# Patient Record
Sex: Female | Born: 1935 | Race: White | Hispanic: No | Marital: Single | Smoking: Former smoker
Health system: Southern US, Community
[De-identification: ages and names within clinical notes are randomized; demographics above are authoritative.]

## PROBLEM LIST (undated history)

## (undated) DIAGNOSIS — G309 Alzheimer's disease, unspecified: Secondary | ICD-10-CM

## (undated) DIAGNOSIS — N189 Chronic kidney disease, unspecified: Secondary | ICD-10-CM

## (undated) DIAGNOSIS — I4891 Unspecified atrial fibrillation: Secondary | ICD-10-CM

## (undated) DIAGNOSIS — R0602 Shortness of breath: Secondary | ICD-10-CM

## (undated) DIAGNOSIS — M549 Dorsalgia, unspecified: Secondary | ICD-10-CM

## (undated) DIAGNOSIS — F329 Major depressive disorder, single episode, unspecified: Secondary | ICD-10-CM

## (undated) DIAGNOSIS — I499 Cardiac arrhythmia, unspecified: Secondary | ICD-10-CM

## (undated) DIAGNOSIS — I209 Angina pectoris, unspecified: Secondary | ICD-10-CM

## (undated) DIAGNOSIS — I10 Essential (primary) hypertension: Secondary | ICD-10-CM

## (undated) DIAGNOSIS — E119 Type 2 diabetes mellitus without complications: Secondary | ICD-10-CM

## (undated) DIAGNOSIS — G629 Polyneuropathy, unspecified: Secondary | ICD-10-CM

## (undated) DIAGNOSIS — G4730 Sleep apnea, unspecified: Secondary | ICD-10-CM

## (undated) DIAGNOSIS — F32A Depression, unspecified: Secondary | ICD-10-CM

## (undated) DIAGNOSIS — I509 Heart failure, unspecified: Secondary | ICD-10-CM

## (undated) DIAGNOSIS — I639 Cerebral infarction, unspecified: Secondary | ICD-10-CM

## (undated) DIAGNOSIS — R413 Other amnesia: Secondary | ICD-10-CM

## (undated) DIAGNOSIS — J449 Chronic obstructive pulmonary disease, unspecified: Secondary | ICD-10-CM

## (undated) DIAGNOSIS — F0280 Dementia in other diseases classified elsewhere without behavioral disturbance: Secondary | ICD-10-CM

## (undated) DIAGNOSIS — M79606 Pain in leg, unspecified: Secondary | ICD-10-CM

## (undated) HISTORY — DX: Sleep apnea, unspecified: G47.30

## (undated) HISTORY — DX: Other amnesia: R41.3

## (undated) HISTORY — DX: Chronic obstructive pulmonary disease, unspecified: J44.9

## (undated) HISTORY — PX: HERNIA REPAIR: SHX51

## (undated) HISTORY — PX: COLON SURGERY: SHX602

## (undated) HISTORY — DX: Type 2 diabetes mellitus without complications: E11.9

## (undated) HISTORY — PX: BREAST SURGERY: SHX581

## (undated) HISTORY — PX: BREAST LUMPECTOMY: SHX2

## (undated) HISTORY — DX: Cardiac arrhythmia, unspecified: I49.9

---

## 2006-09-03 ENCOUNTER — Inpatient Hospital Stay (HOSPITAL_COMMUNITY): Admission: EM | Admit: 2006-09-03 | Discharge: 2006-09-05 | Payer: Self-pay | Admitting: *Deleted

## 2010-08-13 NOTE — H&P (Signed)
NAME:  Kara Ortega NO.:  [id number]   MEDICAL RECORD NO.:  [id number]          PATIENT TYPE:  INP   LOCATION:  1856                         FACILITY:  [facility]   PHYSICIAN:  Rexene Alberts, M.D.    DATE OF BIRTH:  16-Oct-1929   DATE OF ADMISSION:  09/03/2006  DATE OF DISCHARGE:                              HISTORY & PHYSICAL   PRIMARY CARE PHYSICIAN:  The patient is unassigned.   CHIEF COMPLAINT:  Chest pressure with jaw discomfort.   HISTORY OF PRESENT ILLNESS:  The patient is a 77 year old woman with a  past medical history significant for type 2 diabetes mellitus,  hypertension, and hyperlipidemia.  She also has a history of chest pain  and was evaluated by her cardiologist in [place] approximately six  months ago with a stress test.  She was also evaluated a little over one  year ago with a cardiac catheterization.  The patient states that both  procedures yielded essentially negative results.  She is visiting her  son who lives here in [place].  Today as she was shopping with her  daughter-in-law, she developed bilateral jaw discomfort followed by  chest pressure.  At the time the jaw pain was rated as a 7-8/10 in  intensity and the chest pressure was also rated a 7-8/10 in intensity.  At the time that the pain occurred, she was standing.  When she sat  down, the discomfort did not diminish.  She subsequently went home.  Her  pain gradually subsided to a 2/10 in intensity.  The patient initially  did not want to come to the hospital; however, her family insisted.  During the episode of jaw and chest pain, she did have associated  shortness of breath, clamminess, and radiation to the left shoulder.  She did not have any light-headedness, nausea, or pleurisy.  She had an  episode of chest pressure last week in [place].  She was evaluated by  the EMT at home; however, she refused to go to the emergency department  for further evaluation at that time.  The  patient is currently chest  pain free.   The patient was given 1 mg of Dilaudid in the emergency department.  Following Dilaudid, she developed light-headedness, shortness of breath,  facial erythema, and nausea.  It was felt that she had a reaction to  Dilaudid.  After several minutes, she now feels better.  She was also  given Compazine for the nausea.  Currently she is hemodynamically  stable.  Her chest x-ray reveals no acute cardiopulmonary findings.  Her  EKG reveals normal sinus rhythm with a heart rate of 60 beats per minute  and no acute abnormalities.  Her initial cardiac markers are negative.Marland Kitchen  However, given her risk factors, she will be admitted for further  evaluation and management.   PAST MEDICAL HISTORY:  1. History of chest pain with a negative cardiac catheterization      approximately one year to 1-1/2 years ago and a negative stress      test approximately six months ago in [place].  2. Type 2 diabetes mellitus.  3. Hypertension.  4. Hyperlipidemia.  5. COPD.  6. Obstructive sleep apnea on BiPAP nightly.  7. Osteoporosis.  8. Gastroesophageal reflux disease.  9. Restless legs syndrome.  10.Costochondritis.  11.Plantar fasciitis.  12.Obesity.   MEDICATIONS:  1. Actos one tablet daily.  2. Advair 250/50 mcg one puff b.i.d.  3. Aspirin 81 mg daily.  4. Atenolol 50 mg daily.  5. Benadryl as needed.  6. Clonazepam 0.5 mg 1-2 tablets nightly.  7. Fosamax-D 70 mg one tablet weekly.  8. Potassium chloride 10 mEq daily.  9. Metformin 500 mg daily.  10.Nexium 40 mg daily.  11.Singulair 10 mg daily.  12.Triamterene/hydrochlorothiazide 37.5/25 mg daily.  13.Tricor 145 mg daily.  14.Ventolin inhaler one puff as needed.  15.Glyburide 1.25 mg daily.  16.Tylenol PM two tablets nightly as needed.  17.Aleve two tablets p.r.n. for pain.  18.Lyrica 75 mg b.i.d.  19.Voltaren cream, rub on the chest p.r.n.  20.BiPAP nightly.   ALLERGIES:  1. MORPHINE.  2. DILAUDID  (as of tonight).   SOCIAL HISTORY:  The patient is divorced.  She lives in [place].  She  has three children.  She is a retired Pharmacist, hospital.  She is currently  visiting her son Kara Ortega.  She stopped smoking approximately 20  years ago  after smoking 1-1/2 packs of cigarettes for 38 years.  She denies  alcohol and illicit drug use.   FAMILY HISTORY:  Her mother died of heart disease.  Her father died of  complications of diabetes and alcoholism.   REVIEW OF SYSTEMS:  The patient's review of systems is positive for  occasional shortness of breath, degenerative joint disease with pain in  her legs and her lower back, occasional swelling in her legs, numbness  and tingling in her feet.  Otherwise review of systems is negative.   PHYSICAL EXAMINATION:  VITAL SIGNS:  Temperature 97.2, blood pressure  142/83, repeated at 114/60, pulse 60, respiratory rate 20, oxygen  saturation 96% on room air.  GENERAL:  The patient is a pleasant, obese, 77 year old Caucasian woman  who is currently sitting up in bed in no acute distress, although she  was recently nauseated.  HEENT:  Head is normocephalic, nontraumatic.  Pupils are equal, round,  and reactive to light.  Extraocular movements are intact.  Conjunctivae  are clear.  Sclerae are white.  Tympanic membranes are clear  bilaterally.  Nasal mucosa is mildly dry.  No sinus tenderness.  Oropharynx reveals good dentition.  Mucous membranes are mildly dry.  No  posterior exudates or erythema.  NECK:  Supple.  No adenopathy, no thyromegaly, no bruit, no JVD.  LUNGS:  Decreased breath sounds in the bases, otherwise clear.  HEART:  S1/S2 with a 2/6 systolic murmur.  ABDOMEN:  Obese, positive bowel sounds.  Soft, nontender, nondistended.  No hepatosplenomegaly.  No masses palpated.  EXTREMITIES:  Trace of pedal edema bilaterally.  Pedal pulses palpable  bilaterally. NEUROLOGIC:  The patient is alert and oriented x3.  Cranial nerves II-  XII are intact.   Strength is 5/5 throughout.  Sensation is intact.   LABORATORY DATA:  Admission laboratories, chest x-ray results, and EKG  results are above.  Myoglobin 73.7, CK-MB 1.3, troponin I less than  0.05.  BNP 30.  Sodium 140, potassium 3.8, chloride 107, BUN 28, glucose  86, bicarbonate 27, creatinine 0.8.  WBC 9.4, hemoglobin 14.3, platelets  275.   ASSESSMENT:  1. Chest pain.  The patient's pain appears to be somewhat atypical;  however, she does have risk factors including age, diabetes      mellitus, hypertension, and hyperlipidemia.  Her EKG, however, is      completely within normal limits, and her cardiac markers are      negative so far.  Also, more importantly, she just recently      underwent a Cardiolite stress test which was apparently negative.      In addition the cardiac catheterization approximately a year ago      was also negative. Given her recent travel, pulmonary embolism is a      concern.  2. Type 2 diabetes mellitus.  The patient's glycemic control appears      to be within normal limits at this time.  3. Hypertension.  The patient's blood pressure is currently stable.  4. Nausea, vomiting, and diaphoresis followed by Dilaudid.  The      patient probably has either an allergy or an intolerance to      Dilaudid.  There are no signs of cardiopulmonary compromise.   PLAN:  1. The patient will be admitted for further evaluation and management.  2. Will check cardiac enzymes q.8 h. x3 and a D-dimer.  If the      patient's D-dimer is positive, will order a CT scan of the chest to      evaluate for PE.  3. Will continue proton pump inhibitor therapy with Protonix.  Will      add Mylanta p.r.n.  4. Continue chronic medications.  5. Pain management with as needed Tylenol and Vicodin in addition to      Lyrica.      Rexene Alberts, M.D.  Electronically Signed     DF/MEDQ  D:  09/03/2006  T:  09/03/2006  Job:  [id number]

## 2010-08-16 NOTE — Discharge Summary (Signed)
NAMEMarland Kitchen  Kara Ortega, Kara Ortega NO.:  [id number]   MEDICAL RECORD NO.:  [id number]          PATIENT TYPE:  INP   LOCATION:  2032                         FACILITY:  [facility]   PHYSICIAN:  Doree Albee, M.D.DATE OF BIRTH:  1930/03/25   DATE OF ADMISSION:  09/03/2006  DATE OF DISCHARGE:  09/05/2006                               DISCHARGE SUMMARY   FINAL DISCHARGE DIAGNOSES:  1. Atypical chest pain.  2. Paroxysmal atrial fibrillation.  3. Diastolic dysfunction.  4. Type 2 non-insulin-dependent diabetes mellitus.  5. Hypertension.  6. Hyperlipidemia.   CONDITION ON DISCHARGE:  Stable.   MEDICATIONS ON DISCHARGE:  1. Actos 30 mg q.day.  2. Advair Diskus 250/50 b.i.d.  3. Aspirin 81 mg q.day.  4. Atenolol 50 mg q.day.  5. Clonazepam 0.5 mg one to two tablets q.h.s.  6. Fosamax 70 mg once a week.  7. K-Dur 10 mEq q.day.  8. Metformin 500 mg q.day.  9. Nexium 40 mg q.day.  10.Singulair 10 mg q.day.  11.Triamterene/HCTZ  37.5/25 one tablet q.day.  12.Ranexa 500 mg b.i.d.  13.Z-Pak.   HISTORY:  This is a 77 year old lady who was admitted with chest pain  and bilateral jaw discomfort.  Please see initial history and physical  examination done by Dr. Rexene Alberts.   HOSPITAL PROGRESS:  She was seen and serial cardiac enzymes were done.  These were negative.  There was no specific electrocardiographic changes  except that she was in atrial fibrillation.  She was seen by Dr. Verlon Setting,  cardiologist, who added Ranexa to her medications.  The following day  she was doing well and was chest pain free.  On physical examination,  temperature 97.7, blood pressure 120/63, pulse 68, saturation 97% on two  liters of oxygen.   FURTHER DISPOSITION:  She lives out of town, but she will follow up with  a cardiologist prior to going back to her home state.      Doree Albee, M.D.  Electronically Signed     NCG/MEDQ  D:  11/27/2006  T:  11/27/2006  Job:  [id number]

## 2011-01-16 LAB — POCT CARDIAC MARKERS
CKMB, poc: 1.3
CKMB, poc: 2.6
Myoglobin, poc: 73.7
Myoglobin, poc: 79.8
Operator id: 265961
Operator id: 272551
Troponin i, poc: <0.05

## 2011-01-16 LAB — I-STAT 8, (EC8 V) (CONVERTED LAB)
BUN: 28 — ABNORMAL HIGH
Bicarbonate: 26.8 — ABNORMAL HIGH
Glucose, Bld: 86
Sodium: 140
TCO2: 28
pCO2, Ven: 44.6 — ABNORMAL LOW
pH, Ven: 7.386 — ABNORMAL HIGH

## 2011-01-16 LAB — LIPID PANEL
HDL: 46
Total CHOL/HDL Ratio: 4.8

## 2011-01-16 LAB — COMPREHENSIVE METABOLIC PANEL
ALT: 35
Albumin: 4.1
Alkaline Phosphatase: 29 — ABNORMAL LOW
Chloride: 103
Glucose, Bld: 142 — ABNORMAL HIGH
Potassium: 3.7
Sodium: 143
Total Bilirubin: 0.4
Total Protein: 7

## 2011-01-16 LAB — CBC
HCT: 44
Hemoglobin: 14.8
MCHC: 34
MCV: 85
MCV: 85.4
Platelets: 275
Platelets: 275
RBC: 4.94
RBC: 5.15 — ABNORMAL HIGH
WBC: 12 — ABNORMAL HIGH
WBC: 9.4

## 2011-01-16 LAB — CK TOTAL AND CKMB (NOT AT ARMC)
CK, MB: 3.8
Relative Index: 2.5
Total CK: 121
Total CK: 155

## 2011-01-16 LAB — TROPONIN I: Troponin I: 0.02

## 2011-01-16 LAB — DIFFERENTIAL
Basophils Relative: 1
Eosinophils Absolute: 0.2
Monocytes Relative: 9
Neutro Abs: 5.3
Neutrophils Relative %: 56

## 2011-01-16 LAB — D-DIMER, QUANTITATIVE: D-Dimer, Quant: 0.38

## 2011-01-16 LAB — TSH: TSH: 4.455

## 2011-01-16 LAB — POCT I-STAT CREATININE: Creatinine, Ser: 0.8

## 2013-03-25 ENCOUNTER — Ambulatory Visit (INDEPENDENT_AMBULATORY_CARE_PROVIDER_SITE_OTHER): Payer: Medicare PPO | Admitting: Pulmonary Disease

## 2013-03-25 ENCOUNTER — Encounter: Payer: Self-pay | Admitting: Pulmonary Disease

## 2013-03-25 ENCOUNTER — Ambulatory Visit (INDEPENDENT_AMBULATORY_CARE_PROVIDER_SITE_OTHER)
Admission: RE | Admit: 2013-03-25 | Discharge: 2013-03-25 | Disposition: A | Discharge status: Home / Self Care | Payer: Medicare PPO | Source: Ambulatory Visit | Attending: Pulmonary Disease | Admitting: Pulmonary Disease

## 2013-03-25 ENCOUNTER — Institutional Professional Consult (permissible substitution): Payer: Self-pay | Admitting: Pulmonary Disease

## 2013-03-25 VITALS — BP 130/64 | HR 56 | Temp 98.1°F | Ht 61.0 in | Wt 225.0 lb

## 2013-03-25 DIAGNOSIS — R0609 Other forms of dyspnea: Secondary | ICD-10-CM | POA: Insufficient documentation

## 2013-03-25 DIAGNOSIS — R0989 Other specified symptoms and signs involving the circulatory and respiratory systems: Secondary | ICD-10-CM

## 2013-03-25 DIAGNOSIS — R0600 Dyspnea, unspecified: Secondary | ICD-10-CM

## 2013-03-25 MED ORDER — FLUTICASONE-SALMETEROL 45-21 MCG/ACT IN AERO: 2.0000 | INHALATION_SPRAY | Freq: Two times a day (BID) | RESPIRATORY_TRACT | Status: DC | PRN

## 2013-03-25 MED ORDER — ALBUTEROL SULFATE HFA 108 (90 BASE) MCG/ACT IN AERS: 1.0000 | INHALATION_SPRAY | RESPIRATORY_TRACT | Status: DC | PRN

## 2013-03-25 NOTE — Assessment & Plan Note (Signed)
The patient has significant dyspnea on exertion that I suspect is multifactorial. She is morbidly obese with a significant central component which can lead to restrictive physiology, she is clearly deconditioned, and also describes what sounds like angina and a recent hospitalization for congestive heart failure. She has chronic lower extremity edema. Finally, it is unclear how much of this is obstructive lung disease, and we will get her recent PFTs for evaluation. For now, I have asked her to continue on Advair, but she needs to take this on a twice a day basis regularly. Will also check a chest x-ray for completeness.

## 2013-03-25 NOTE — Patient Instructions (Signed)
Stay on advair at current dose for now, but make sure you take 2 puffs am and pm EVERYDAY no matter what.  Rinse mouth well after using. Will send in a prescription for 90 days on advair and albuterol rescue with refills. Will check a chest xray today and call you with results, as well as get breathing studies from your prior allergist.  Work on weight loss and conditioning as able.  This will really help your breathing.  followup with me in 38mos, but will call once I can receive and review your old records.

## 2013-03-25 NOTE — Addendum Note (Signed)
Addended by: Virl Cagey on: 03/25/2013 12:18 PM   Modules accepted: Orders

## 2013-03-25 NOTE — Progress Notes (Signed)
   Subjective:    Patient ID: Kara Ortega, female    DOB: Jul 06, 1929, 79 y.o.   MRN: [id number]  HPI The patient is an 79 year old female who I've been asked to see for breathing issues. The patient tells me that she has been diagnosed with COPD by an allergist in her prior home of [place], and she has been on Advair for 13 years but has not been taking on a consistent basis. She tells me that no one has ever characterized her airflow obstruction, and her last PFTs were 6 months ago. She does have a history of moderate tobacco abuse, but has not smoked in 30 years, and has no personal history of asthma during childhood. She has not been on prednisone for quite some time, but was in the hospital about a year ago for what sounds like angina and congestive heart failure. She is scheduled to see a cardiologist. She describes a less than one block dyspnea on exertion, and can get winded just walking through the house at times. She finds it very difficult to walk any length of stairs, and describes chest pain halfway up a flight of stairs. She has no chronic cough and only produces mucus on occasion. She has chronic lower extremity edema.   Review of Systems  Constitutional: Negative for fever and unexpected weight change.  HENT: Positive for congestion and sinus pressure. Negative for dental problem, ear pain, nosebleeds, postnasal drip, rhinorrhea, sneezing, sore throat and trouble swallowing.   Eyes: Negative for redness and itching.  Respiratory: Positive for cough and shortness of breath. Negative for chest tightness and wheezing.   Cardiovascular: Negative for palpitations and leg swelling.  Gastrointestinal: Negative for nausea and vomiting.  Genitourinary: Negative for dysuria.  Musculoskeletal: Negative for joint swelling.  Skin: Negative for rash.  Neurological: Negative for headaches.  Hematological: Does not bruise/bleed easily.  Psychiatric/Behavioral: Negative for dysphoric mood. The  patient is not nervous/anxious.        Objective:   Physical Exam Constitutional:  Morbidly obese female, no acute distress  HENT:  Nares patent without discharge  Oropharynx without exudate, palate and uvula are normal  Eyes:  Perrla, eomi, no scleral icterus  Neck:  No JVD, no TMG  Cardiovascular:  Normal rate, regular rhythm, no rubs or gallops.  3/6 sem        Intact distal pulses but diminished.  Pulmonary :  Normal breath sounds, no stridor or respiratory distress   No rales, rhonchi, or wheezing  Abdominal:  Soft, nondistended, bowel sounds present.  No tenderness noted.   Musculoskeletal:  2+ lower extremity edema noted.  Lymph Nodes:  No cervical lymphadenopathy noted  Skin:  No cyanosis noted  Neurologic:  Alert, appropriate, moves all 4 extremities without obvious deficit.         Assessment & Plan:

## 2013-04-11 ENCOUNTER — Telehealth: Payer: Self-pay | Admitting: Pulmonary Disease

## 2013-04-11 NOTE — Telephone Encounter (Signed)
Spoke with son Delfino Lovett)-- Going to speak with patient and will contact our office back once he checks with her on scheduling appt.

## 2013-04-11 NOTE — Telephone Encounter (Signed)
Please let pt know that we received a lot of records, but there were no breathing studies in them.  See if she would be willing to have  Breathing studies here?  If so, needs full pfts scheduled soon, and I will call once I review results.

## 2013-06-02 ENCOUNTER — Telehealth: Payer: Self-pay | Admitting: Pulmonary Disease

## 2013-06-02 DIAGNOSIS — R0609 Other forms of dyspnea: Principal | ICD-10-CM

## 2013-06-02 DIAGNOSIS — R0600 Dyspnea, unspecified: Secondary | ICD-10-CM

## 2013-06-02 NOTE — Telephone Encounter (Signed)
See message string. Kara Ortega, please see if this pt has an upcoming apptm with me, and if so, she needs full pfts prior to the visit or same day.

## 2013-06-03 NOTE — Telephone Encounter (Signed)
Pt scheduled for PFT same day as appt with [place] (09/23/13) at 1pm Instructions to be mailed to patient home address.  Pt aware that these are being mailed.

## 2013-07-06 ENCOUNTER — Telehealth: Payer: Self-pay | Admitting: Cardiology

## 2013-07-06 NOTE — Telephone Encounter (Signed)
New message     Kara Ortega a new murmur today while visiting the patient.  Want to make doctor aware

## 2013-07-06 NOTE — Telephone Encounter (Signed)
Please review and advise.

## 2013-07-07 NOTE — Telephone Encounter (Signed)
OK to have her come in for clinic visit.

## 2013-07-08 NOTE — Telephone Encounter (Signed)
Spoke with patients son, patient is scheduled to be seen in the office.

## 2013-07-14 ENCOUNTER — Ambulatory Visit: Payer: Medicare PPO | Admitting: Cardiology

## 2013-09-13 ENCOUNTER — Encounter (HOSPITAL_COMMUNITY): Payer: Self-pay | Admitting: Emergency Medicine

## 2013-09-13 ENCOUNTER — Inpatient Hospital Stay (HOSPITAL_COMMUNITY)
Admission: EM | Admit: 2013-09-13 | Discharge: 2013-09-14 | DRG: 392 | Disposition: A | Discharge status: Home / Self Care | Payer: Medicare Other | Attending: Internal Medicine | Admitting: Internal Medicine

## 2013-09-13 DIAGNOSIS — R197 Diarrhea, unspecified: Secondary | ICD-10-CM | POA: Diagnosis present

## 2013-09-13 DIAGNOSIS — I10 Essential (primary) hypertension: Secondary | ICD-10-CM | POA: Diagnosis present

## 2013-09-13 DIAGNOSIS — E119 Type 2 diabetes mellitus without complications: Secondary | ICD-10-CM | POA: Diagnosis present

## 2013-09-13 DIAGNOSIS — I4891 Unspecified atrial fibrillation: Secondary | ICD-10-CM | POA: Diagnosis present

## 2013-09-13 DIAGNOSIS — Z79899 Other long term (current) drug therapy: Secondary | ICD-10-CM

## 2013-09-13 DIAGNOSIS — R0609 Other forms of dyspnea: Secondary | ICD-10-CM

## 2013-09-13 DIAGNOSIS — K219 Gastro-esophageal reflux disease without esophagitis: Principal | ICD-10-CM | POA: Diagnosis present

## 2013-09-13 DIAGNOSIS — Z6841 Body Mass Index (BMI) 40.0 and over, adult: Secondary | ICD-10-CM

## 2013-09-13 DIAGNOSIS — I509 Heart failure, unspecified: Secondary | ICD-10-CM | POA: Diagnosis present

## 2013-09-13 DIAGNOSIS — G4733 Obstructive sleep apnea (adult) (pediatric): Secondary | ICD-10-CM | POA: Diagnosis present

## 2013-09-13 DIAGNOSIS — E669 Obesity, unspecified: Secondary | ICD-10-CM | POA: Diagnosis present

## 2013-09-13 DIAGNOSIS — Z794 Long term (current) use of insulin: Secondary | ICD-10-CM

## 2013-09-13 DIAGNOSIS — Z8249 Family history of ischemic heart disease and other diseases of the circulatory system: Secondary | ICD-10-CM

## 2013-09-13 DIAGNOSIS — Z7982 Long term (current) use of aspirin: Secondary | ICD-10-CM

## 2013-09-13 DIAGNOSIS — Z87891 Personal history of nicotine dependence: Secondary | ICD-10-CM

## 2013-09-13 DIAGNOSIS — I200 Unstable angina: Secondary | ICD-10-CM

## 2013-09-13 DIAGNOSIS — R0600 Dyspnea, unspecified: Secondary | ICD-10-CM

## 2013-09-13 DIAGNOSIS — Z7901 Long term (current) use of anticoagulants: Secondary | ICD-10-CM

## 2013-09-13 DIAGNOSIS — J449 Chronic obstructive pulmonary disease, unspecified: Secondary | ICD-10-CM

## 2013-09-13 DIAGNOSIS — R079 Chest pain, unspecified: Secondary | ICD-10-CM | POA: Diagnosis present

## 2013-09-13 DIAGNOSIS — J4489 Other specified chronic obstructive pulmonary disease: Secondary | ICD-10-CM | POA: Diagnosis present

## 2013-09-13 HISTORY — DX: Heart failure, unspecified: I50.9

## 2013-09-13 HISTORY — DX: Unspecified atrial fibrillation: I48.91

## 2013-09-13 HISTORY — DX: Major depressive disorder, single episode, unspecified: F32.9

## 2013-09-13 HISTORY — DX: Shortness of breath: R06.02

## 2013-09-13 HISTORY — DX: Angina pectoris, unspecified: I20.9

## 2013-09-13 HISTORY — DX: Essential (primary) hypertension: I10

## 2013-09-13 HISTORY — DX: Chronic kidney disease, unspecified: N18.9

## 2013-09-13 HISTORY — DX: Cerebral infarction, unspecified: I63.9

## 2013-09-13 HISTORY — DX: Depression, unspecified: F32.A

## 2013-09-13 NOTE — ED Notes (Signed)
Pt presents with central chest pressure radiating up into her Left neck/ear, nausea, diaphoresis, dizziness, lightheaded, and SOB. Pt states her Left ear feels full. Pt states her pain woke her from her sleep. Pt describes her pain as a burning/pressure. Pt states she took 2 nitro's at 2310 and 2315 with no relief

## 2013-09-14 ENCOUNTER — Emergency Department (HOSPITAL_COMMUNITY): Payer: Medicare Other

## 2013-09-14 ENCOUNTER — Encounter (HOSPITAL_COMMUNITY): Payer: Self-pay | Admitting: General Practice

## 2013-09-14 DIAGNOSIS — I200 Unstable angina: Secondary | ICD-10-CM | POA: Diagnosis present

## 2013-09-14 DIAGNOSIS — I4891 Unspecified atrial fibrillation: Secondary | ICD-10-CM | POA: Diagnosis present

## 2013-09-14 DIAGNOSIS — E119 Type 2 diabetes mellitus without complications: Secondary | ICD-10-CM

## 2013-09-14 DIAGNOSIS — J449 Chronic obstructive pulmonary disease, unspecified: Secondary | ICD-10-CM | POA: Diagnosis present

## 2013-09-14 DIAGNOSIS — I509 Heart failure, unspecified: Secondary | ICD-10-CM | POA: Diagnosis present

## 2013-09-14 DIAGNOSIS — R197 Diarrhea, unspecified: Secondary | ICD-10-CM | POA: Diagnosis present

## 2013-09-14 DIAGNOSIS — R079 Chest pain, unspecified: Secondary | ICD-10-CM

## 2013-09-14 LAB — CBC
HCT: 40.6 % (ref 36.0–46.0)
HEMATOCRIT: 39.4 % (ref 36.0–46.0)
HEMOGLOBIN: 12.6 g/dL (ref 12.0–15.0)
HEMOGLOBIN: 13.1 g/dL (ref 12.0–15.0)
MCH: 27.2 pg (ref 26.0–34.0)
MCH: 27.8 pg (ref 26.0–34.0)
MCHC: 32 g/dL (ref 30.0–36.0)
MCHC: 32.3 g/dL (ref 30.0–36.0)
MCV: 85.1 fL (ref 78.0–100.0)
MCV: 86 fL (ref 78.0–100.0)
Platelets: 242 10*3/uL (ref 150–400)
Platelets: 260 10*3/uL (ref 150–400)
RBC: 4.63 MIL/uL (ref 3.87–5.11)
RBC: 4.72 MIL/uL (ref 3.87–5.11)
RDW: 16.4 % — ABNORMAL HIGH (ref 11.5–15.5)
RDW: 16.5 % — ABNORMAL HIGH (ref 11.5–15.5)
WBC: 8.2 10*3/uL (ref 4.0–10.5)
WBC: 8.8 10*3/uL (ref 4.0–10.5)

## 2013-09-14 LAB — APTT: APTT: 49 s — AB (ref 24–37)

## 2013-09-14 LAB — TROPONIN I
Troponin I: <0.3 ng/mL (ref <0.30)
Troponin I: <0.3 ng/mL (ref <0.30)
Troponin I: <0.3 ng/mL (ref <0.30)

## 2013-09-14 LAB — PROTIME-INR
INR: 2.89 — ABNORMAL HIGH (ref 0.00–1.49)
INR: 2.83 — AB (ref 0.00–1.49)
PROTHROMBIN TIME: 28.8 s — AB (ref 11.6–15.2)
PROTHROMBIN TIME: 29.2 s — AB (ref 11.6–15.2)

## 2013-09-14 LAB — BASIC METABOLIC PANEL
BUN: 35 mg/dL — AB (ref 6–23)
BUN: 37 mg/dL — AB (ref 6–23)
CALCIUM: 10.2 mg/dL (ref 8.4–10.5)
CALCIUM: 10.4 mg/dL (ref 8.4–10.5)
CO2: 20 mEq/L (ref 19–32)
CO2: 26 meq/L (ref 19–32)
CREATININE: 1.05 mg/dL (ref 0.50–1.10)
Chloride: 98 mEq/L (ref 96–112)
Chloride: 98 mEq/L (ref 96–112)
Creatinine, Ser: 1.21 mg/dL — ABNORMAL HIGH (ref 0.50–1.10)
GFR calc Af Amer: 47 mL/min — ABNORMAL LOW (ref >90)
GFR calc Af Amer: 56 mL/min — ABNORMAL LOW (ref >90)
GFR calc non Af Amer: 48 mL/min — ABNORMAL LOW (ref >90)
GFR, EST NON AFRICAN AMERICAN: 41 mL/min — AB (ref >90)
GLUCOSE: 213 mg/dL — AB (ref 70–99)
Glucose, Bld: 160 mg/dL — ABNORMAL HIGH (ref 70–99)
POTASSIUM: 4.8 meq/L (ref 3.7–5.3)
Potassium: 4.4 mEq/L (ref 3.7–5.3)
Sodium: 140 mEq/L (ref 137–147)
Sodium: 140 mEq/L (ref 137–147)

## 2013-09-14 LAB — GLUCOSE, CAPILLARY
GLUCOSE-CAPILLARY: 160 mg/dL — AB (ref 70–99)
GLUCOSE-CAPILLARY: 162 mg/dL — AB (ref 70–99)
GLUCOSE-CAPILLARY: 208 mg/dL — AB (ref 70–99)

## 2013-09-14 LAB — I-STAT TROPONIN, ED: Troponin i, poc: 0.01 ng/mL (ref 0.00–0.08)

## 2013-09-14 LAB — MRSA PCR SCREENING: MRSA BY PCR: NEGATIVE

## 2013-09-14 LAB — PRO B NATRIURETIC PEPTIDE: PRO B NATRI PEPTIDE: 115.2 pg/mL (ref 0–450)

## 2013-09-14 MED ORDER — ZOLPIDEM TARTRATE 5 MG PO TABS: 5.0000 mg | ORAL_TABLET | Freq: Every evening | ORAL | Status: DC | PRN

## 2013-09-14 MED ORDER — ALUM & MAG HYDROXIDE-SIMETH 200-200-20 MG/5ML PO SUSP: 30.0000 mL | Freq: Four times a day (QID) | ORAL | Status: DC | PRN

## 2013-09-14 MED ORDER — SODIUM CHLORIDE 0.9 % IJ SOLN: 3.0000 mL | INTRAMUSCULAR | Status: DC | PRN

## 2013-09-14 MED ORDER — ADULT MULTIVITAMIN W/MINERALS CH
1.0000 | ORAL_TABLET | Freq: Every day | ORAL | Status: DC
  Administered 2013-09-14: 11:00:00 1 via ORAL
  Filled 2013-09-14: qty 1

## 2013-09-14 MED ORDER — B COMPLEX-C PO TABS
1.0000 | ORAL_TABLET | Freq: Every day | ORAL | Status: DC
Start: 2013-09-14 — End: 2013-09-14
  Administered 2013-09-14: 11:00:00 1 via ORAL
  Filled 2013-09-14: qty 1

## 2013-09-14 MED ORDER — DILTIAZEM HCL ER BEADS 240 MG PO CP24
240.0000 mg | ORAL_CAPSULE | Freq: Every day | ORAL | Status: DC
  Administered 2013-09-14: 240 mg via ORAL
  Filled 2013-09-14: qty 1

## 2013-09-14 MED ORDER — SUCRALFATE 1 GM/10ML PO SUSP
1.0000 g | Freq: Three times a day (TID) | ORAL | Status: DC
  Administered 2013-09-14 (×3): 1 g via ORAL
  Filled 2013-09-14 (×5): qty 10

## 2013-09-14 MED ORDER — INSULIN DETEMIR 100 UNIT/ML ~~LOC~~ SOLN
58.0000 [IU] | Freq: Every day | SUBCUTANEOUS | Status: DC
  Filled 2013-09-14: qty 0.58

## 2013-09-14 MED ORDER — NITROGLYCERIN 2 % TD OINT
0.5000 [in_us] | TOPICAL_OINTMENT | Freq: Once | TRANSDERMAL | Status: AC
  Administered 2013-09-14: 0.5 [in_us] via TOPICAL
  Filled 2013-09-14: qty 1

## 2013-09-14 MED ORDER — SODIUM CHLORIDE 0.9 % IV SOLN: 250.0000 mL | INTRAVENOUS | Status: DC | PRN

## 2013-09-14 MED ORDER — SPIRONOLACTONE 25 MG PO TABS
25.0000 mg | ORAL_TABLET | Freq: Every day | ORAL | Status: DC
  Administered 2013-09-14: 25 mg via ORAL
  Filled 2013-09-14: qty 1

## 2013-09-14 MED ORDER — POTASSIUM CHLORIDE CRYS ER 20 MEQ PO TBCR
40.0000 meq | EXTENDED_RELEASE_TABLET | Freq: Every day | ORAL | Status: DC
  Administered 2013-09-14: 40 meq via ORAL
  Filled 2013-09-14 (×2): qty 2

## 2013-09-14 MED ORDER — ONDANSETRON HCL 4 MG/2ML IJ SOLN: 4.0000 mg | Freq: Four times a day (QID) | INTRAMUSCULAR | Status: DC | PRN

## 2013-09-14 MED ORDER — PANTOPRAZOLE SODIUM 40 MG PO TBEC
40.0000 mg | DELAYED_RELEASE_TABLET | Freq: Every day | ORAL | Status: DC
  Administered 2013-09-14: 11:00:00 40 mg via ORAL
  Filled 2013-09-14: qty 1

## 2013-09-14 MED ORDER — WARFARIN SODIUM 5 MG PO TABS
5.0000 mg | ORAL_TABLET | Freq: Every day | ORAL | Status: DC
  Filled 2013-09-14: qty 1

## 2013-09-14 MED ORDER — VITAMIN D3 25 MCG (1000 UNIT) PO TABS
4000.0000 [IU] | ORAL_TABLET | Freq: Every day | ORAL | Status: DC
  Administered 2013-09-14: 11:00:00 4000 [IU] via ORAL
  Filled 2013-09-14: qty 4

## 2013-09-14 MED ORDER — MOMETASONE FURO-FORMOTEROL FUM 100-5 MCG/ACT IN AERO
2.0000 | INHALATION_SPRAY | Freq: Two times a day (BID) | RESPIRATORY_TRACT | Status: DC
  Administered 2013-09-14: 08:00:00 2 via RESPIRATORY_TRACT
  Filled 2013-09-14: qty 8.8

## 2013-09-14 MED ORDER — NITROGLYCERIN 0.4 MG SL SUBL: 0.4000 mg | SUBLINGUAL_TABLET | SUBLINGUAL | Status: DC | PRN

## 2013-09-14 MED ORDER — ATORVASTATIN CALCIUM 10 MG PO TABS
10.0000 mg | ORAL_TABLET | Freq: Every day | ORAL | Status: DC
  Filled 2013-09-14: qty 1

## 2013-09-14 MED ORDER — ACETAMINOPHEN 325 MG PO TABS: 650.0000 mg | ORAL_TABLET | Freq: Four times a day (QID) | ORAL | Status: DC | PRN

## 2013-09-14 MED ORDER — WARFARIN - PHYSICIAN DOSING INPATIENT: Freq: Every day | Status: DC

## 2013-09-14 MED ORDER — POTASSIUM CHLORIDE 20 MEQ PO PACK
40.0000 meq | PACK | Freq: Every day | ORAL | Status: DC
  Filled 2013-09-14: qty 2

## 2013-09-14 MED ORDER — COLESEVELAM HCL 625 MG PO TABS
1875.0000 mg | ORAL_TABLET | Freq: Two times a day (BID) | ORAL | Status: DC
  Administered 2013-09-14 (×2): 1875 mg via ORAL
  Filled 2013-09-14 (×3): qty 3

## 2013-09-14 MED ORDER — ALBUTEROL SULFATE (2.5 MG/3ML) 0.083% IN NEBU: 3.0000 mL | INHALATION_SOLUTION | RESPIRATORY_TRACT | Status: DC | PRN

## 2013-09-14 MED ORDER — GI COCKTAIL ~~LOC~~
30.0000 mL | Freq: Once | ORAL | Status: AC
  Administered 2013-09-14: 30 mL via ORAL

## 2013-09-14 MED ORDER — ASPIRIN 81 MG PO CHEW
162.0000 mg | CHEWABLE_TABLET | Freq: Once | ORAL | Status: AC
  Administered 2013-09-14: 162 mg via ORAL
  Filled 2013-09-14: qty 2

## 2013-09-14 MED ORDER — SODIUM CHLORIDE 0.9 % IJ SOLN
3.0000 mL | Freq: Two times a day (BID) | INTRAMUSCULAR | Status: DC
  Administered 2013-09-14: 13:00:00 3 mL via INTRAVENOUS

## 2013-09-14 MED ORDER — CALCIUM CARBONATE 1250 (500 CA) MG PO TABS
1.0000 | ORAL_TABLET | Freq: Every day | ORAL | Status: DC
  Administered 2013-09-14: 10:00:00 500 mg via ORAL
  Filled 2013-09-14 (×2): qty 1

## 2013-09-14 MED ORDER — OXYCODONE HCL 5 MG PO TABS: 5.0000 mg | ORAL_TABLET | ORAL | Status: DC | PRN

## 2013-09-14 MED ORDER — NITROGLYCERIN 0.4 MG SL SUBL
0.4000 mg | SUBLINGUAL_TABLET | SUBLINGUAL | Status: DC | PRN
Start: 2013-09-14 — End: 2013-09-14
  Administered 2013-09-14: 0.4 mg via SUBLINGUAL
  Filled 2013-09-14: qty 1

## 2013-09-14 MED ORDER — METOPROLOL TARTRATE 25 MG PO TABS
25.0000 mg | ORAL_TABLET | Freq: Two times a day (BID) | ORAL | Status: DC
  Administered 2013-09-14: 11:00:00 25 mg via ORAL
  Filled 2013-09-14: qty 1

## 2013-09-14 MED ORDER — HYDROCODONE-ACETAMINOPHEN 5-325 MG PO TABS
1.0000 | ORAL_TABLET | Freq: Once | ORAL | Status: AC
  Administered 2013-09-14: 0.5 via ORAL
  Filled 2013-09-14: qty 1

## 2013-09-14 MED ORDER — NITROGLYCERIN 2 % TD OINT
1.0000 [in_us] | TOPICAL_OINTMENT | Freq: Four times a day (QID) | TRANSDERMAL | Status: DC
  Administered 2013-09-14: 1 [in_us] via TOPICAL
  Filled 2013-09-14: qty 30

## 2013-09-14 MED ORDER — ALLOPURINOL 100 MG PO TABS
100.0000 mg | ORAL_TABLET | Freq: Every day | ORAL | Status: DC
  Administered 2013-09-14: 11:00:00 100 mg via ORAL
  Filled 2013-09-14: qty 1

## 2013-09-14 MED ORDER — INSULIN DETEMIR 100 UNIT/ML ~~LOC~~ SOLN
58.0000 [IU] | Freq: Once | SUBCUTANEOUS | Status: AC
  Administered 2013-09-14: 58 [IU] via SUBCUTANEOUS
  Filled 2013-09-14: qty 0.58

## 2013-09-14 MED ORDER — ONDANSETRON HCL 4 MG PO TABS: 4.0000 mg | ORAL_TABLET | Freq: Four times a day (QID) | ORAL | Status: DC | PRN

## 2013-09-14 MED ORDER — FENOFIBRATE 160 MG PO TABS
160.0000 mg | ORAL_TABLET | Freq: Every day | ORAL | Status: DC
  Administered 2013-09-14: 160 mg via ORAL
  Filled 2013-09-14: qty 1

## 2013-09-14 MED ORDER — CITALOPRAM HYDROBROMIDE 20 MG PO TABS
20.0000 mg | ORAL_TABLET | Freq: Every day | ORAL | Status: DC
  Administered 2013-09-14: 20 mg via ORAL
  Filled 2013-09-14: qty 1

## 2013-09-14 MED ORDER — ASPIRIN EC 325 MG PO TBEC
325.0000 mg | DELAYED_RELEASE_TABLET | Freq: Every day | ORAL | Status: DC
  Administered 2013-09-14: 11:00:00 325 mg via ORAL
  Filled 2013-09-14: qty 1

## 2013-09-14 MED ORDER — FUROSEMIDE 20 MG PO TABS
20.0000 mg | ORAL_TABLET | Freq: Every day | ORAL | Status: DC
  Administered 2013-09-14: 11:00:00 20 mg via ORAL
  Filled 2013-09-14: qty 1

## 2013-09-14 MED ORDER — ALPRAZOLAM 0.25 MG PO TABS: 0.2500 mg | ORAL_TABLET | Freq: Two times a day (BID) | ORAL | Status: DC | PRN

## 2013-09-14 MED ORDER — RIVASTIGMINE 9.5 MG/24HR TD PT24
9.5000 mg | MEDICATED_PATCH | Freq: Every day | TRANSDERMAL | Status: DC
  Administered 2013-09-14: 11:00:00 9.5 mg via TRANSDERMAL
  Filled 2013-09-14: qty 1

## 2013-09-14 MED ORDER — ACETAMINOPHEN 650 MG RE SUPP: 650.0000 mg | Freq: Four times a day (QID) | RECTAL | Status: DC | PRN

## 2013-09-14 NOTE — H&P (Signed)
[facility] History and Physical  Kara Ortega [id number] DOB: [birth year] DOA: 09/13/2013  Referring physician:  EDP PCP: [name], MD  Specialists:   Chief Complaint:  Chest Pain  HPI: Kara Ortega is a 80 y.o. female with a history of CHF, HTN, Atrial fibrillation on Warfarin rx, IDDM, and OSA who presents to the ED with complaints of Chest pain that awakened her from rest at 10 pm located in the substernal area.   She had associated SOB, Nausea and Diaphoresis.   The painvwas burning and pressure-like in the Mid-Chest.   She rated the pain at a 6/10, and reports that the pain is intermittent.  The pain radiated up into the left side of her neck and jaw.   She was administered SL NTG X 2 which relieved the pain in the jaw and neck but she reports the the mid-chest area discomfort remains.     She also reports having 4 episodes of diarrhea daily over the past week.  She reports that each time she eats the diarrhea occurs right after.   She denies any fevers or chills.      Review of Systems:  Constitutional: No Weight Loss, No Weight Gain, Night Sweats, Fevers, Chills, Fatigue, or Generalized Weakness HEENT: No Headaches, Difficulty Swallowing,Tooth/Dental Problems,Sore Throat,  No Sneezing, Rhinitis, Ear Ache, Nasal Congestion, or Post Nasal Drip,  Cardio-vascular: +Chest pain, Orthopnea, PND, Edema in lower extremities, Anasarca, +Dizziness, Palpitations  Resp: +Dyspnea, No DOE, No Productive Cough, No Non-Productive Cough, No Hemoptysis, No Change in Color of Mucus,  No Wheezing.    GI: No Heartburn, Indigestion, Abdominal Pain, +Nausea, Vomiting, Diarrhea, Change in Bowel Habits,  Loss of Appetite  GU: No Dysuria, Change in Color of Urine, No Urgency or Frequency.  No flank pain.  Musculoskeletal: No Joint Pain or Swelling.  No Decreased Range of Motion. No Back Pain.  Neurologic: No Syncope, No Seizures, Muscle Weakness, Paresthesia, Vision Disturbance or  Loss, No Diplopia, No Vertigo, No Difficulty Walking,  Skin: No Rash or Lesions. Psych: No Change in Mood or Affect. No Depression or Anxiety. No Memory loss. No Confusion or Hallucinations   Past Medical History  Diagnosis Date  . Abnormal heart rhythms   . Diabetes     insulin dependent  . Sleep apnea     uses bipap X12 years.  Marland Kitchen COPD (chronic obstructive pulmonary disease)   . CHF (congestive heart failure)   . A-fib     Past Surgical History  Procedure Laterality Date  . Breast surgery      X4  . Abdominal surgery      removed 14 inches of colon  . Hernia repair       Prior to Admission medications   Medication Sig Start Date End Date Taking? Authorizing Provider  albuterol (PROVENTIL HFA;VENTOLIN HFA) 108 (90 BASE) MCG/ACT inhaler Inhale 1-2 puffs into the lungs every 4 (four) hours as needed for wheezing or shortness of breath. 03/25/13  Yes Kathee Delton, MD  allopurinol (ZYLOPRIM) 100 MG tablet Take 100 mg by mouth daily.   Yes Historical Provider, MD  ALPRAZolam (XANAX) 0.25 MG tablet Take 0.25 mg by mouth 2 (two) times daily as needed for anxiety.   Yes Historical Provider, MD  aspirin 81 MG chewable tablet Chew 81 mg by mouth daily.   Yes Historical Provider, MD  atorvastatin (LIPITOR) 10 MG tablet Take 10 mg by mouth at bedtime.    Yes Historical Provider, MD  b complex vitamins  tablet Take 1 tablet by mouth daily.   Yes Historical Provider, MD  B Complex-C (B-COMPLEX WITH VITAMIN C) tablet Take 1 tablet by mouth daily.   Yes Historical Provider, MD  calcium carbonate (OS-CAL - DOSED IN MG OF ELEMENTAL CALCIUM) 1250 MG tablet Take 1 tablet by mouth daily with breakfast.   Yes Historical Provider, MD  celecoxib (CELEBREX) 200 MG capsule Take 200 mg by mouth daily.   Yes Historical Provider, MD  Cholecalciferol (HM VITAMIN D3) 4000 UNITS CAPS Take 12,000 Units by mouth daily.    Yes Historical Provider, MD  citalopram (CELEXA) 20 MG tablet Take 20 mg by mouth daily.    Yes Historical Provider, MD  colchicine 0.6 MG tablet Take 0.6 mg by mouth daily as needed (gout).   Yes Historical Provider, MD  colesevelam (WELCHOL) 625 MG tablet Take 1,875 mg by mouth 2 (two) times daily with a meal.   Yes Historical Provider, MD  diclofenac sodium (VOLTAREN) 1 % GEL Apply 4 g topically 4 (four) times daily as needed (pain).    Yes Historical Provider, MD  diltiazem (TIAZAC) 240 MG 24 hr capsule Take 240 mg by mouth daily.   Yes Historical Provider, MD  esomeprazole (NEXIUM) 40 MG capsule Take 40 mg by mouth daily.    Yes Historical Provider, MD  fenofibrate micronized (LOFIBRA) 134 MG capsule Take 134 mg by mouth daily before breakfast.   Yes Historical Provider, MD  fluticasone-salmeterol (ADVAIR HFA) 45-21 MCG/ACT inhaler Inhale 2 puffs into the lungs 2 (two) times daily as needed (congestion).   Yes Historical Provider, MD  furosemide (LASIX) 40 MG tablet Take 20 mg by mouth daily.   Yes Historical Provider, MD  HYDROcodone-acetaminophen (NORCO/VICODIN) 5-325 MG per tablet Take 0.5 tablets by mouth every 6 (six) hours as needed for moderate pain.   Yes Historical Provider, MD  insulin aspart (NOVOLOG FLEXPEN) 100 UNIT/ML FlexPen Inject 1-11 Units into the skin 3 (three) times daily with meals. 3 units plus 1 unit for every 15mg /dl over 75.   Yes Historical Provider, MD  Insulin Detemir (LEVEMIR FLEXTOUCH) 100 UNIT/ML Pen Inject 58 Units into the skin at bedtime.   Yes Historical Provider, MD  isosorbide mononitrate (IMDUR) 30 MG 24 hr tablet Take 30 mg by mouth every 12 (twelve) hours.   Yes Historical Provider, MD  metoprolol tartrate (LOPRESSOR) 25 MG tablet Take 25 mg by mouth 2 (two) times daily.   Yes Historical Provider, MD  Multiple Vitamin (MULTIVITAMIN WITH MINERALS) TABS tablet Take 1 tablet by mouth daily.   Yes Historical Provider, MD  nitroGLYCERIN (NITROSTAT) 0.4 MG SL tablet Place 0.4 mg under the tongue every 5 (five) minutes as needed for chest pain.    Yes  Historical Provider, MD  potassium chloride (KLOR-CON) 20 MEQ packet Take 40 mEq by mouth daily.    Yes Historical Provider, MD  rivastigmine (EXELON) 9.5 mg/24hr Place 9.5 mg onto the skin daily.   Yes Historical Provider, MD  sitaGLIPtin-metformin (JANUMET) 50-500 MG per tablet Take 1 tablet by mouth 2 (two) times daily with a meal.   Yes Historical Provider, MD  spironolactone (ALDACTONE) 25 MG tablet Take 25 mg by mouth daily.   Yes Historical Provider, MD  warfarin (COUMADIN) 5 MG tablet Take 5 mg by mouth daily.   Yes Historical Provider, MD  zolpidem (AMBIEN) 5 MG tablet Take 5 mg by mouth at bedtime as needed for sleep.   Yes Historical Provider, MD    Allergies  Allergen Reactions  . Avandia [Rosiglitazone] Other (See Comments)    edema  . Benazepril Other (See Comments)    unknown  . Dilaudid [Hydromorphone Hcl]     "does not tolerate strong pain medications" per pt  . Fosamax [Alendronate Sodium] Nausea And Vomiting  . Talwin [Pentazocine] Nausea And Vomiting  . Tetanus Toxoids     "allergic to something in the tetanus injection" per patient  . Vytorin [Ezetimibe-Simvastatin] Nausea And Vomiting  . Zyrtec [Cetirizine] Other (See Comments)    unknown    Social History:  reports that she quit smoking about 45 years ago. Her smoking use included Cigarettes. She has a 27 pack-year smoking history. She has never used smokeless tobacco. She reports that she does not drink alcohol or use illicit drugs.     Family History  Problem Relation Age of Onset  . Heart disease Mother   . Heart disease Sister       Physical Exam:  GEN:  Pleasant Elderly Obese 80 y.o. Caucasian female examined and in no acute distress; cooperative with exam Filed Vitals:   09/14/13 0145 09/14/13 0200 09/14/13 0230 09/14/13 0325  BP: 136/61 129/60 130/48 128/77  Pulse: 53 53 58 51  Temp:    97.4 F (36.3 C)  TempSrc:    Oral  Resp: 14 10 14 17   Height:    5' (1.524 m)  Weight:    104.3 kg (229  lb 15 oz)  SpO2: 90% 92% 98% 94%   Blood pressure 128/77, pulse 51, temperature 97.4 F (36.3 C), temperature source Oral, resp. rate 17, height 5' (1.524 m), weight 104.3 kg (229 lb 15 oz), SpO2 94.00%. PSYCH: She is alert and oriented x4; does not appear anxious does not appear depressed; affect is normal HEENT: Normocephalic and Atraumatic, Mucous membranes pink; PERRLA; EOM intact; Fundi:  Benign;  No scleral icterus, Nares: Patent, Oropharynx: Clear, Fair Dentition, Neck:  FROM, no cervical lymphadenopathy nor thyromegaly or carotid bruit; no JVD; Breasts:: Not examined CHEST WALL: No tenderness CHEST: Normal respiration, clear to auscultation bilaterally HEART: Regular rate and rhythm; no murmurs rubs or gallops BACK: No kyphosis or scoliosis; no CVA tenderness ABDOMEN: Positive Bowel Sounds, Obese, soft non-tender; no masses, no organomegaly, no pannus; no intertriginous candida. Rectal Exam: Not done EXTREMITIES: No cyanosis, clubbing or edema; no ulcerations. Genitalia: not examined PULSES: 2+ and symmetric SKIN: Normal hydration no rash or ulceration CNS:  Alert and oriented X 4, No Focal Deficits.   Vascular: pulses palpable throughout    Labs on Admission:  Basic Metabolic Panel:  Recent Labs Lab 09/13/13 2350  NA 140  K 4.8  CL 98  CO2 20  GLUCOSE 213*  BUN 37*  CREATININE 1.21*  CALCIUM 10.2   Liver Function Tests: No results found for this basename: AST, ALT, ALKPHOS, BILITOT, PROT, ALBUMIN,  in the last 168 hours No results found for this basename: LIPASE, AMYLASE,  in the last 168 hours No results found for this basename: AMMONIA,  in the last 168 hours CBC:  Recent Labs Lab 09/13/13 2350  WBC 8.8  HGB 13.1  HCT 40.6  MCV 86.0  PLT 260   Cardiac Enzymes:  Recent Labs Lab 09/13/13 2350  TROPONINI <0.30    BNP (last 3 results)  Recent Labs  09/13/13 2350  PROBNP 115.2   CBG: No results found for this basename: GLUCAP,  in the last 168  hours  Radiological Exams on Admission: Dg Chest 2 View  09/14/2013  CLINICAL DATA:  Chest pain.  EXAM: CHEST  2 VIEW  COMPARISON:  Chest radiograph performed 03/25/2013  FINDINGS: The lungs are well-aerated. Mild bibasilar opacities likely reflect atelectasis. There is no evidence of focal opacification, pleural effusion or pneumothorax.  The heart is borderline normal in size; the mediastinal contour is within normal limits. No acute osseous abnormalities are seen.  IMPRESSION: Mild bibasilar airspace opacities likely reflect atelectasis; lungs otherwise clear.   Electronically Signed   By: Garald Balding M.D.   On: 09/14/2013 00:08      EKG: Independently reviewed. Normal sinus Rhythm  Without acute ST changes.  Rate = 60    Assessment/Plan:   80 y.o. female with  Principal Problem:   Unstable angina Active Problems:   CHF (congestive heart failure)   COPD (chronic obstructive pulmonary disease)   A-fib   Diabetes   Diarrhea    1.     Unstable Angina-  Cardiac Monitoring,  Cycle Troponins,  Nitropaste, O2 and ASA Rx, also on Warfarin Rx.   Notify Cards in AM,  She sees Dr Marlou Porch.    2.     CHF-  Currently compensated, continue Lasix daily, and Aldactone, and Metoprolol Rx.  Monitor for S/Sxs of Fluid Overload.   Pro-BNP =115.2 at admission.    3.     COPD- stable , continue ADvair BID, and PRN Albuterol rescue Inhaler.    4.     Atrial Fibrillation-  Continue Diltiazem, and Metoprolol Rx for rate control, and Warfarin Rx.   Monitor PT/INR daily, currently Therapeutic.    5.     IDDM-  Continue Levemir RX, monitor Glucose levels and add SSI coverage PRN, check HbA1C in AM.    6.     Diarrhea-   Enteric Precautions,  C.diff and Stool for C+S sent.    7.              Code Status:  FULL CODE     Family Communication:    Son at Bedside Disposition Plan:     Observation  Time spent:  [place] [facility] Pager [phone]  If 7PM-7AM,  please contact night-coverage [email or web address] Password [facility] 09/14/2013, 3:45 AM

## 2013-09-14 NOTE — ED Provider Notes (Addendum)
CSN: [id number]     Arrival date & time 09/13/13  2325 History   First MD Initiated Contact with Patient 09/13/13 2352     Chief Complaint  Patient presents with  . Chest Pain     (Consider location/radiation/quality/duration/timing/severity/associated sxs/prior Treatment) HPI Comments: Pt comes in with cc of chest pain. Hx of IDDM, HTN, HL, afib on coumadin. Pt reports waking up at 10 pm with acute left sided chest pain. Chest pain is described as burning pain, with radiation to the left neck. Pain is mostly mid sternal. + dib and diaphoresis. Pt has no hx of similar pain. She has no known CAD.  Patient is a 80 y.o. female presenting with chest pain. The history is provided by the patient.  Chest Pain Associated symptoms: cough, diaphoresis and shortness of breath   Associated symptoms: no abdominal pain, no nausea and not vomiting     Past Medical History  Diagnosis Date  . Abnormal heart rhythms   . Diabetes     insulin dependent  . Sleep apnea     uses bipap X12 years.  Marland Kitchen COPD (chronic obstructive pulmonary disease)   . CHF (congestive heart failure)   . A-fib    Past Surgical History  Procedure Laterality Date  . Breast surgery      X4  . Abdominal surgery      removed 14 inches of colon  . Hernia repair     Family History  Problem Relation Age of Onset  . Heart disease Mother   . Heart disease Sister    History  Substance Use Topics  . Smoking status: Former Smoker -- 1.50 packs/day for 18 years    Types: Cigarettes    Quit date: 03/31/1968  . Smokeless tobacco: Never Used  . Alcohol Use: No   OB History   Grav Para Term Preterm Abortions TAB SAB Ect Mult Living                 Review of Systems  Constitutional: Positive for diaphoresis. Negative for activity change.  HENT: Negative for facial swelling.   Respiratory: Positive for cough and shortness of breath. Negative for wheezing.   Cardiovascular: Positive for chest pain.  Gastrointestinal:  Negative for nausea, vomiting, abdominal pain, diarrhea, constipation, blood in stool and abdominal distention.  Genitourinary: Negative for hematuria and difficulty urinating.  Musculoskeletal: Negative for neck pain.  Skin: Negative for color change.  Neurological: Negative for speech difficulty.  Hematological: Does not bruise/bleed easily.  Psychiatric/Behavioral: Negative for confusion.      Allergies  Avandia; Benazepril; Dilaudid; Fosamax; Talwin; Tetanus toxoids; Vytorin; and Zyrtec  Home Medications   Prior to Admission medications   Medication Sig Start Date End Date Taking? Authorizing Darnette Lampron  albuterol (PROVENTIL HFA;VENTOLIN HFA) 108 (90 BASE) MCG/ACT inhaler Inhale 1-2 puffs into the lungs every 4 (four) hours as needed for wheezing or shortness of breath. 03/25/13  Yes Kathee Delton, MD  allopurinol (ZYLOPRIM) 100 MG tablet Take 100 mg by mouth daily.   Yes Historical Latonyia Lopata, MD  ALPRAZolam (XANAX) 0.25 MG tablet Take 0.25 mg by mouth 2 (two) times daily as needed for anxiety.   Yes Historical Nijah Orlich, MD  aspirin 81 MG chewable tablet Chew 81 mg by mouth daily.   Yes Historical Ricky Gallery, MD  atorvastatin (LIPITOR) 10 MG tablet Take 10 mg by mouth at bedtime.    Yes Historical Lyndsi Altic, MD  b complex vitamins tablet Take 1 tablet by mouth daily.  Yes Historical Nailyn Dearinger, MD  B Complex-C (B-COMPLEX WITH VITAMIN C) tablet Take 1 tablet by mouth daily.   Yes Historical Erskin Zinda, MD  calcium carbonate (OS-CAL - DOSED IN MG OF ELEMENTAL CALCIUM) 1250 MG tablet Take 1 tablet by mouth daily with breakfast.   Yes Historical Kenzy Campoverde, MD  celecoxib (CELEBREX) 200 MG capsule Take 200 mg by mouth daily.   Yes Historical Jacklyne Baik, MD  Cholecalciferol (HM VITAMIN D3) 4000 UNITS CAPS Take 12,000 Units by mouth daily.    Yes Historical Jaramiah Bossard, MD  citalopram (CELEXA) 20 MG tablet Take 20 mg by mouth daily.   Yes Historical Landon Bassford, MD  colchicine 0.6 MG tablet Take 0.6 mg by  mouth daily as needed (gout).   Yes Historical Jet Armbrust, MD  colesevelam (WELCHOL) 625 MG tablet Take 1,875 mg by mouth 2 (two) times daily with a meal.   Yes Historical Kierston Plasencia, MD  diclofenac sodium (VOLTAREN) 1 % GEL Apply 4 g topically 4 (four) times daily as needed (pain).    Yes Historical Alfretta Pinch, MD  diltiazem (TIAZAC) 240 MG 24 hr capsule Take 240 mg by mouth daily.   Yes Historical Scottlyn Mchaney, MD  esomeprazole (NEXIUM) 40 MG capsule Take 40 mg by mouth daily.    Yes Historical Orvill Coulthard, MD  fenofibrate micronized (LOFIBRA) 134 MG capsule Take 134 mg by mouth daily before breakfast.   Yes Historical Jamus Loving, MD  fluticasone-salmeterol (ADVAIR HFA) 45-21 MCG/ACT inhaler Inhale 2 puffs into the lungs 2 (two) times daily as needed (congestion).   Yes Historical Kaysin Brock, MD  furosemide (LASIX) 40 MG tablet Take 20 mg by mouth daily.   Yes Historical Tiron Suski, MD  HYDROcodone-acetaminophen (NORCO/VICODIN) 5-325 MG per tablet Take 0.5 tablets by mouth every 6 (six) hours as needed for moderate pain.   Yes Historical Reika Callanan, MD  insulin aspart (NOVOLOG FLEXPEN) 100 UNIT/ML FlexPen Inject 1-11 Units into the skin 3 (three) times daily with meals. 3 units plus 1 unit for every 15mg /dl over 75.   Yes Historical Swayze Kozuch, MD  Insulin Detemir (LEVEMIR FLEXTOUCH) 100 UNIT/ML Pen Inject 58 Units into the skin at bedtime.   Yes Historical Britiny Defrain, MD  isosorbide mononitrate (IMDUR) 30 MG 24 hr tablet Take 30 mg by mouth every 12 (twelve) hours.   Yes Historical Nakota Elsen, MD  metoprolol tartrate (LOPRESSOR) 25 MG tablet Take 25 mg by mouth 2 (two) times daily.   Yes Historical Kenna Seward, MD  Multiple Vitamin (MULTIVITAMIN WITH MINERALS) TABS tablet Take 1 tablet by mouth daily.   Yes Historical Brylin Stopper, MD  nitroGLYCERIN (NITROSTAT) 0.4 MG SL tablet Place 0.4 mg under the tongue every 5 (five) minutes as needed for chest pain.    Yes Historical Isreal Moline, MD  potassium chloride (KLOR-CON) 20 MEQ packet  Take 40 mEq by mouth daily.    Yes Historical Jase Reep, MD  rivastigmine (EXELON) 9.5 mg/24hr Place 9.5 mg onto the skin daily.   Yes Historical Eryn Marandola, MD  sitaGLIPtin-metformin (JANUMET) 50-500 MG per tablet Take 1 tablet by mouth 2 (two) times daily with a meal.   Yes Historical Manual Navarra, MD  spironolactone (ALDACTONE) 25 MG tablet Take 25 mg by mouth daily.   Yes Historical Meliah Appleman, MD  warfarin (COUMADIN) 5 MG tablet Take 5 mg by mouth daily.   Yes Historical Jabria Loos, MD  zolpidem (AMBIEN) 5 MG tablet Take 5 mg by mouth at bedtime as needed for sleep.   Yes Historical Chassity Ludke, MD   BP 132/51  Pulse 58  Temp(Src) 97.6 F (36.4 C) (Oral)  Resp 18  Ht 5' (1.524 m)  Wt 223 lb (101.152 kg)  BMI 43.55 kg/m2  SpO2 93% Physical Exam  Nursing note and vitals reviewed. Constitutional: She is oriented to person, place, and time. She appears well-developed and well-nourished.  HENT:  Head: Normocephalic and atraumatic.  Eyes: EOM are normal. Pupils are equal, round, and reactive to light.  Neck: Neck supple. No JVD present.  Cardiovascular: Normal rate and regular rhythm.   Murmur heard. Pulmonary/Chest: Effort normal. No respiratory distress. She has no wheezes.  Abdominal: Soft. She exhibits no distension. There is no tenderness. There is no rebound and no guarding.  Musculoskeletal: She exhibits no edema.  Neurological: She is alert and oriented to person, place, and time.  Skin: Skin is warm and dry.    ED Course  Procedures (including critical care time) Labs Review Labs Reviewed  CBC - Abnormal; Notable for the following:    RDW 16.5 (*)    All other components within normal limits  BASIC METABOLIC PANEL - Abnormal; Notable for the following:    Glucose, Bld 213 (*)    BUN 37 (*)    Creatinine, Ser 1.21 (*)    GFR calc non Af Amer 41 (*)    GFR calc Af Amer 47 (*)    All other components within normal limits  APTT - Abnormal; Notable for the following:    aPTT 49  (*)    All other components within normal limits  PROTIME-INR - Abnormal; Notable for the following:    Prothrombin Time 29.2 (*)    INR 2.89 (*)    All other components within normal limits  PRO B NATRIURETIC PEPTIDE  TROPONIN I  I-STAT TROPOININ, ED    Imaging Review Dg Chest 2 View  09/14/2013   CLINICAL DATA:  Chest pain.  EXAM: CHEST  2 VIEW  COMPARISON:  Chest radiograph performed 03/25/2013  FINDINGS: The lungs are well-aerated. Mild bibasilar opacities likely reflect atelectasis. There is no evidence of focal opacification, pleural effusion or pneumothorax.  The heart is borderline normal in size; the mediastinal contour is within normal limits. No acute osseous abnormalities are seen.  IMPRESSION: Mild bibasilar airspace opacities likely reflect atelectasis; lungs otherwise clear.   Electronically Signed   By: Garald Balding M.D.   On: 09/14/2013 00:08     EKG Interpretation   Date/Time:  Tuesday September 13 2013 23:32:53 EDT Ventricular Rate:  59 PR Interval:  228 QRS Duration: 82 QT Interval:  434 QTC Calculation: 429 R Axis:   64 Text Interpretation:  Sinus bradycardia with 1st degree A-V block Cannot  rule out Anterior infarct , age undetermined No acute findings previous q  wave in lead III not present Confirmed by Kathrynn Humble, MD, Thelma Comp [phone]) on  09/13/2013 11:52:22 PM      MDM   Final diagnoses:  Unstable angina    Pt comes in with cc of chest pain. Chest pain is atypical - burning type pain. She reports that the pain is not similar to her GERD or COPD. Her pain is moving to the neck, and is constant, and she had diaphoresis and dib. NO CAD hx, and no recent provocative testing.  Pt will need admission for ACS r/o. Her initial EKG has no acute changes.   2:11 AM Neck pain resolved with nitro, chest pain still present. Norco given per patient's request, as she has severe reactions to iv opioids (not anaphylaxis), nitro paste ordered. Admit to step down. She is  not getting heparin as her inr > 2,  CRITICAL CARE Performed by: Varney Biles   Total critical care time: 45 min  Critical care time was exclusive of separately billable procedures and treating other patients.  Critical care was necessary to treat or prevent imminent or life-threatening deterioration.  Critical care was time spent personally by me on the following activities: development of treatment plan with patient and/or surrogate as well as nursing, discussions with consultants, evaluation of patient's response to treatment, examination of patient, obtaining history from patient or surrogate, ordering and performing treatments and interventions, ordering and review of laboratory studies, ordering and review of radiographic studies, pulse oximetry and re-evaluation of patient's condition.   Varney Biles, MD 09/14/13 [id number]  Varney Biles, MD 09/14/13 [id number]

## 2013-09-14 NOTE — Progress Notes (Signed)
Pt ambulated 200 ft tolerated well, denies any discomforts, vital signs stable post ambulation.

## 2013-09-14 NOTE — ED Notes (Signed)
Dr. Jenkins at bedside. 

## 2013-09-14 NOTE — Progress Notes (Signed)
UR complete   Crystal K. Hutchinson, RN, BSN, [place], CCM  09/14/2013 2:28 PMd

## 2013-09-14 NOTE — ED Notes (Signed)
Dr. Nanavati at bedside 

## 2013-09-14 NOTE — Discharge Summary (Addendum)
Physician Discharge Summary  Kara Ortega [id number] DOB: [birth year] DOA: 09/13/2013  PCP: [name], MD  Admit date: 09/13/2013 Discharge date: 09/14/2013  Time spent: >45 minutes  Discharge Diagnoses:  Principal Problem:   Unstable angina Active Problems:   CHF (congestive heart failure)   COPD (chronic obstructive pulmonary disease)   A-fib   Diabetes   Diarrhea   Discharge Condition: stable  Diet recommendation: heart healthy and diabetic  Filed Weights   09/13/13 2333 09/14/13 0325  Weight: 101.152 kg (223 lb) 104.3 kg (229 lb 15 oz)    History of present illness:  Kara Ortega is a 80 y.o. female with a history of CHF, HTN, Atrial fibrillation on Warfarin rx, IDDM, and OSA who presents to the ED with complaints of Chest pain that awakened her from rest at 10 pm located in the substernal area. She had associated SOB, Nausea and Diaphoresis. The painvwas burning and pressure-like in the Mid-Chest. She rated the pain at a 6/10, and reports that the pain is intermittent. The pain radiated up into the left side of her neck and jaw. She was administered SL NTG X 2 which relieved the pain in the jaw and neck but she reports the the mid-chest area discomfort remains.  She also reports having 4 episodes of diarrhea daily over the past week. She reports that each time she eats the diarrhea occurs right after. She denies any fevers or chills.   Hospital Course:   Chest pain - per patient it was substernal burning pain awakening her from sleep and relieved with either GI cocktail  or the "pain" medication- she receive both Nitro past and Vicodin at about the same time in the ER- has not had any further Vicodin and no further pain despite walking in the hall - she has not been have episodes of this pain- this was the first time - troponins negative-  - suspect it was related to GERD- cont outpt PPI and follow up PCP  Diarrhea  - apparently was having diarrhea after  meals - no episodes since yesterday afternoon despite eating her meals today  CHF- cont home meds  COPD - stable  A-fib- cont rate controlling meds and Warfarin- has seen Dr Kingsley Plan for the first time (recently moved here)  IDDM - stable  Procedures:  none  Consultations:  none  Discharge Exam: Filed Vitals:   09/14/13 0830  BP: 127/40  Pulse: 57  Temp: 97.7 F (36.5 C)  Resp: 18    General: AAO x 3, no distress Cardiovascular: IIRR, no murmurs  Respiratory: CTA b/l  Abd: non-tender, non-distended BS+  Discharge Instructions You were cared for by a hospitalist during your hospital stay. If you have any questions about your discharge medications or the care you received while you were in the hospital after you are discharged, you can call the unit and asked to speak with the hospitalist on call if the hospitalist that took care of you is not available. Once you are discharged, your primary care physician will handle any further medical issues. Please note that NO REFILLS for any discharge medications will be authorized once you are discharged, as it is imperative that you return to your primary care physician (or establish a relationship with a primary care physician if you do not have one) for your aftercare needs so that they can reassess your need for medications and monitor your lab values.      Discharge Instructions   Diet - low sodium heart  healthy    Complete by:  As directed   And diabetic     Increase activity slowly    Complete by:  As directed             Medication List         albuterol 108 (90 BASE) MCG/ACT inhaler  Commonly known as:  PROVENTIL HFA;VENTOLIN HFA  Inhale 1-2 puffs into the lungs every 4 (four) hours as needed for wheezing or shortness of breath.     allopurinol 100 MG tablet  Commonly known as:  ZYLOPRIM  Take 100 mg by mouth daily.     ALPRAZolam 0.25 MG tablet  Commonly known as:  XANAX  Take 0.25 mg by mouth 2 (two) times  daily as needed for anxiety.     aspirin 81 MG chewable tablet  Chew 81 mg by mouth daily.     atorvastatin 10 MG tablet  Commonly known as:  LIPITOR  Take 10 mg by mouth at bedtime.     b complex vitamins tablet  Take 1 tablet by mouth daily.     B-complex with vitamin C tablet  Take 1 tablet by mouth daily.     calcium carbonate 1250 MG tablet  Commonly known as:  OS-CAL - dosed in mg of elemental calcium  Take 1 tablet by mouth daily with breakfast.     celecoxib 200 MG capsule  Commonly known as:  CELEBREX  Take 200 mg by mouth daily.     citalopram 20 MG tablet  Commonly known as:  CELEXA  Take 20 mg by mouth daily.     colchicine 0.6 MG tablet  Take 0.6 mg by mouth daily as needed (gout).     colesevelam 625 MG tablet  Commonly known as:  WELCHOL  Take 1,875 mg by mouth 2 (two) times daily with a meal.     diclofenac sodium 1 % Gel  Commonly known as:  VOLTAREN  Apply 4 g topically 4 (four) times daily as needed (pain).     diltiazem 240 MG 24 hr capsule  Commonly known as:  TIAZAC  Take 240 mg by mouth daily.     esomeprazole 40 MG capsule  Commonly known as:  NEXIUM  Take 40 mg by mouth daily.     fenofibrate micronized 134 MG capsule  Commonly known as:  LOFIBRA  Take 134 mg by mouth daily before breakfast.     fluticasone-salmeterol 45-21 MCG/ACT inhaler  Commonly known as:  ADVAIR HFA  Inhale 2 puffs into the lungs 2 (two) times daily as needed (congestion).     furosemide 40 MG tablet  Commonly known as:  LASIX  Take 20 mg by mouth daily.     HM VITAMIN D3 4000 UNITS Caps  Generic drug:  Cholecalciferol  Take 12,000 Units by mouth daily.     HYDROcodone-acetaminophen 5-325 MG per tablet  Commonly known as:  NORCO/VICODIN  Take 0.5 tablets by mouth every 6 (six) hours as needed for moderate pain.     isosorbide mononitrate 30 MG 24 hr tablet  Commonly known as:  IMDUR  Take 30 mg by mouth every 12 (twelve) hours.     LEVEMIR FLEXTOUCH  100 UNIT/ML Pen  Generic drug:  Insulin Detemir  Inject 58 Units into the skin at bedtime.     metoprolol tartrate 25 MG tablet  Commonly known as:  LOPRESSOR  Take 25 mg by mouth 2 (two) times daily.     multivitamin with  minerals Tabs tablet  Take 1 tablet by mouth daily.     nitroGLYCERIN 0.4 MG SL tablet  Commonly known as:  NITROSTAT  Place 0.4 mg under the tongue every 5 (five) minutes as needed for chest pain.     NOVOLOG FLEXPEN 100 UNIT/ML FlexPen  Generic drug:  insulin aspart  Inject 1-11 Units into the skin 3 (three) times daily with meals. 3 units plus 1 unit for every 15mg /dl over 75.     potassium chloride 20 MEQ packet  Commonly known as:  KLOR-CON  Take 40 mEq by mouth daily.     rivastigmine 9.5 mg/24hr  Commonly known as:  EXELON  Place 9.5 mg onto the skin daily.     sitaGLIPtin-metformin 50-500 MG per tablet  Commonly known as:  JANUMET  Take 1 tablet by mouth 2 (two) times daily with a meal.     spironolactone 25 MG tablet  Commonly known as:  ALDACTONE  Take 25 mg by mouth daily.     warfarin 5 MG tablet  Commonly known as:  COUMADIN  Take 5 mg by mouth daily.     zolpidem 5 MG tablet  Commonly known as:  AMBIEN  Take 5 mg by mouth at bedtime as needed for sleep.       Allergies  Allergen Reactions  . Avandia [Rosiglitazone] Other (See Comments)    edema  . Benazepril Other (See Comments)    unknown  . Dilaudid [Hydromorphone Hcl]     "does not tolerate strong pain medications" per pt  . Fosamax [Alendronate Sodium] Nausea And Vomiting  . Talwin [Pentazocine] Nausea And Vomiting  . Tetanus Toxoids     "allergic to something in the tetanus injection" per patient  . Vytorin [Ezetimibe-Simvastatin] Nausea And Vomiting  . Zyrtec [Cetirizine] Other (See Comments)    unknown      The results of significant diagnostics from this hospitalization (including imaging, microbiology, ancillary and laboratory) are listed below for reference.     Significant Diagnostic Studies: Dg Chest 2 View  09/14/2013   CLINICAL DATA:  Chest pain.  EXAM: CHEST  2 VIEW  COMPARISON:  Chest radiograph performed 03/25/2013  FINDINGS: The lungs are well-aerated. Mild bibasilar opacities likely reflect atelectasis. There is no evidence of focal opacification, pleural effusion or pneumothorax.  The heart is borderline normal in size; the mediastinal contour is within normal limits. No acute osseous abnormalities are seen.  IMPRESSION: Mild bibasilar airspace opacities likely reflect atelectasis; lungs otherwise clear.   Electronically Signed   By: Garald Balding M.D.   On: 09/14/2013 00:08    Microbiology: Recent Results (from the past 240 hour(s))  MRSA PCR SCREENING     Status: None   Collection Time    09/14/13  3:16 AM      Result Value Ref Range Status   MRSA by PCR NEGATIVE  NEGATIVE Final   Comment:            The GeneXpert MRSA Assay (FDA     approved for NASAL specimens     only), is one component of a     comprehensive MRSA colonization     surveillance program. It is not     intended to diagnose MRSA     infection nor to guide or     monitor treatment for     MRSA infections.     Labs: Basic Metabolic Panel:  Recent Labs Lab 09/13/13 2350 09/14/13 0500  NA 140 140  K 4.8 4.4  CL 98 98  CO2 20 26  GLUCOSE 213* 160*  BUN 37* 35*  CREATININE 1.21* 1.05  CALCIUM 10.2 10.4   Liver Function Tests: No results found for this basename: AST, ALT, ALKPHOS, BILITOT, PROT, ALBUMIN,  in the last 168 hours No results found for this basename: LIPASE, AMYLASE,  in the last 168 hours No results found for this basename: AMMONIA,  in the last 168 hours CBC:  Recent Labs Lab 09/13/13 2350 09/14/13 0500  WBC 8.8 8.2  HGB 13.1 12.6  HCT 40.6 39.4  MCV 86.0 85.1  PLT 260 242   Cardiac Enzymes:  Recent Labs Lab 09/13/13 2350 09/14/13 0600 09/14/13 1011  TROPONINI <0.30 <0.30 <0.30   BNP: BNP (last 3 results)  Recent  Labs  09/13/13 2350  PROBNP 115.2   CBG:  Recent Labs Lab 09/14/13 0412 09/14/13 0859 09/14/13 1324  GLUCAP 160* 162* 208*       Signed:  [place]  [facility] 09/14/2013, 4:12 PM

## 2013-09-14 NOTE — Progress Notes (Signed)
Inpatient Diabetes Program Recommendations  AACE/ADA: New Consensus Statement on Inpatient Glycemic Control (2013)  Target Ranges:  Prepandial:   less than 140 mg/dL      Peak postprandial:   less than 180 mg/dL (1-2 hours)      Critically ill patients:  140 - 180 mg/dL     Results for Kara Ortega, Kara Ortega (MRN [id number]) as of 09/14/2013 09:18  Ref. Range 09/14/2013 04:12 09/14/2013 08:59  Glucose-Capillary Latest Range: 70-99 mg/dL 160 (H) 162 (H)     Admitted with CP.  +DM2, HTN, CHF  Home DM Meds: Levemir 58 units QHS + Janumet 50/500 bid + Novolog 3 units tid + 1 unit for every 15 mg/dl above target CBG of 75 mg/dl   **Started Home Dose of Levemir this AM at 4am.   MD- Please start Novolog Moderate SSI tid ac + HS   Will follow Wyn Quaker RN, MSN, CDE Diabetes Coordinator Inpatient Diabetes Program Team Pager: [phone] (8a-10p)

## 2013-09-14 NOTE — Progress Notes (Signed)
Patient arrived from E.D. Stable with chest pain feeling better post G.I. Cocktail and nitro paste. She is in and out of slow Afib 39-50  And SB with PAC'S .

## 2013-09-14 NOTE — Care Management Note (Addendum)
  Page 1 of 1   09/14/2013     2:34:06 PM CARE MANAGEMENT NOTE 09/14/2013  Patient:  Kara Ortega   Account Number:  [id number]  Date Initiated:  09/14/2013  Documentation initiated by:  Mariann Laster  Subjective/Objective Assessment:   CP     Action/Plan:   CM to follow for dispositon needs   Anticipated DC Date:  09/15/2013   Anticipated DC Plan:  HOME/SELF CARE         Choice offered to / List presented to:             Status of service:  Completed, signed off Medicare Important Message given?   (If response is "NO", the following Medicare IM given date fields will be blank) Date Medicare IM given:   Date Additional Medicare IM given:    Discharge Disposition:  HOME/SELF CARE  Per UR Regulation:  Reviewed for med. necessity/level of care/duration of stay  If discussed at [place] of Stay Meetings, dates discussed:    Comments:  Crystal Hutchinson RN, BSN, MSHL, CCM  Nurse - Case Manager, (Unit [phone]  09/14/2013 Med review:  no special needs  noted at this time. CM will continue to follow

## 2013-09-23 ENCOUNTER — Ambulatory Visit (INDEPENDENT_AMBULATORY_CARE_PROVIDER_SITE_OTHER): Payer: Medicare Other | Admitting: Pulmonary Disease

## 2013-09-23 ENCOUNTER — Encounter: Payer: Self-pay | Admitting: Pulmonary Disease

## 2013-09-23 VITALS — BP 110/72 | HR 75 | Temp 96.1°F | Ht 61.0 in | Wt 228.0 lb

## 2013-09-23 DIAGNOSIS — R0989 Other specified symptoms and signs involving the circulatory and respiratory systems: Secondary | ICD-10-CM

## 2013-09-23 DIAGNOSIS — R0609 Other forms of dyspnea: Secondary | ICD-10-CM

## 2013-09-23 DIAGNOSIS — R0600 Dyspnea, unspecified: Secondary | ICD-10-CM

## 2013-09-23 NOTE — Progress Notes (Signed)
PFT done today. 

## 2013-09-23 NOTE — Progress Notes (Signed)
   Subjective:    Patient ID: Kara Ortega, female    DOB: 07/09/1929, 80 y.o.   MRN: [id number]  HPI The patient comes in today for followup of her recent pulmonary function studies, done as part of the workup of dyspnea on exertion. She was found to have no airflow obstruction, no restriction, and is totally normal diffusion capacity. I have reviewed the studies with her in detail, and answered all of her questions.   Review of Systems  Constitutional: Negative for fever and unexpected weight change.  HENT: Negative for congestion, dental problem, ear pain, nosebleeds, postnasal drip, rhinorrhea, sinus pressure, sneezing, sore throat and trouble swallowing.   Eyes: Negative for redness and itching.  Respiratory: Positive for shortness of breath. Negative for cough, chest tightness and wheezing.   Cardiovascular: Negative for palpitations and leg swelling.  Gastrointestinal: Negative for nausea and vomiting.  Genitourinary: Negative for dysuria.  Musculoskeletal: Negative for joint swelling.  Skin: Negative for rash.  Neurological: Negative for headaches.  Hematological: Does not bruise/bleed easily.  Psychiatric/Behavioral: Negative for dysphoric mood. The patient is not nervous/anxious.        Objective:   Physical Exam Morbidly obese female in no acute distress Nose without purulence or discharge noted Neck without lymphadenopathy or thyromegaly Lower extremities with edema noted, no cyanosis Alert and oriented, moves all 4 extremities.       Assessment & Plan:

## 2013-09-23 NOTE — Patient Instructions (Signed)
Stop advair and albuterol.  Your breathing studies are normal Ok to take zyrtec 10mg  at bedtime for your allergies I am happy to see you for your sleep apnea, but will need to arrange a 27min apptm for you to come in and discuss.  Would be helpful if you can get a copy of your prior sleep study. Work on weight loss

## 2013-09-23 NOTE — Assessment & Plan Note (Signed)
The patient has totally normal pulmonary function studies, and therefore does not have COPD. I would find it very unlikely that she has asthma as well. I do not see a specific pulmonary reason for her shortness of breath, and suspect that centripetal obesity and deconditioning are playing major roles.  I cannot exclude the possibility of a cardiac issue, and will leave that to her primary care physician for evaluation.

## 2013-09-27 LAB — PULMONARY FUNCTION TEST
DL/VA % PRED: 101 %
DL/VA: 4.47 ml/min/mmHg/L
DLCO unc % pred: 104 %
DLCO unc: 21.12 ml/min/mmHg
FEF 25-75 Post: 1.87 L/sec
FEF 25-75 Pre: 1.35 L/sec
FEF2575-%CHANGE-POST: 38 %
FEF2575-%PRED-POST: 164 %
FEF2575-%PRED-PRE: 118 %
FEV1-%CHANGE-POST: 7 %
FEV1-%PRED-POST: 126 %
FEV1-%PRED-PRE: 117 %
FEV1-Post: 2 L
FEV1-Pre: 1.86 L
FEV1FVC-%CHANGE-POST: 7 %
FEV1FVC-%Pred-Pre: 100 %
FEV6-%Change-Post: 1 %
FEV6-%Pred-Post: 124 %
FEV6-%Pred-Pre: 122 %
FEV6-PRE: 2.48 L
FEV6-Post: 2.52 L
FEV6FVC-%Change-Post: 1 %
FEV6FVC-%Pred-Post: 106 %
FEV6FVC-%Pred-Pre: 104 %
FVC-%Change-Post: 0 %
FVC-%Pred-Post: 116 %
FVC-%Pred-Pre: 116 %
FVC-Post: 2.52 L
FVC-Pre: 2.53 L
POST FEV1/FVC RATIO: 79 %
POST FEV6/FVC RATIO: 100 %
Pre FEV1/FVC ratio: 74 %
Pre FEV6/FVC Ratio: 98 %
RV % pred: 78 %
RV: 1.79 L
TLC % pred: 105 %
TLC: 4.87 L

## 2013-09-29 ENCOUNTER — Other Ambulatory Visit (HOSPITAL_COMMUNITY): Payer: Self-pay | Admitting: Family Medicine

## 2013-09-29 DIAGNOSIS — Z1231 Encounter for screening mammogram for malignant neoplasm of breast: Secondary | ICD-10-CM

## 2013-10-11 ENCOUNTER — Institutional Professional Consult (permissible substitution): Payer: Medicare Other | Admitting: Pulmonary Disease

## 2013-10-13 ENCOUNTER — Ambulatory Visit (HOSPITAL_COMMUNITY): Payer: Medicare Other

## 2013-10-28 ENCOUNTER — Ambulatory Visit (HOSPITAL_COMMUNITY): Payer: Medicare Other | Attending: Family Medicine

## 2013-10-28 ENCOUNTER — Institutional Professional Consult (permissible substitution): Payer: Medicare Other | Admitting: Pulmonary Disease

## 2013-11-16 ENCOUNTER — Telehealth: Payer: Self-pay | Admitting: Pulmonary Disease

## 2013-11-16 NOTE — Telephone Encounter (Signed)
Spoke with Richard(caller and EC for patient)-states patient has been here for about 1 year now and got her CPAP from [place], [place] of who the DME company was from there.   [place] are you okay with getting patient established with local DME prior to patient seeing you 02-03-14 for Sleep consult. She is a patient of yours now for Pulmonary issues-last seen 09/23/13.   Thanks.

## 2013-11-16 NOTE — Telephone Encounter (Signed)
Called and spoke with Delfino Lovett and he is aware that he will need to get the sleep study from her last DME or from the DR. That ordered this.  He will do this and call us back once this has been done to set her up with [facility] for DME.

## 2013-11-16 NOTE — Telephone Encounter (Signed)
That's fine, but see if we can track down her sleep study since the dme will want this, and may not do anything without it.

## 2013-11-18 ENCOUNTER — Institutional Professional Consult (permissible substitution): Payer: Medicare Other | Admitting: Pulmonary Disease

## 2013-12-26 ENCOUNTER — Encounter: Payer: Self-pay | Admitting: Neurology

## 2013-12-26 ENCOUNTER — Ambulatory Visit (INDEPENDENT_AMBULATORY_CARE_PROVIDER_SITE_OTHER): Payer: Medicare Other | Admitting: Neurology

## 2013-12-26 VITALS — BP 128/70 | HR 68 | Temp 98.0°F | Resp 16 | Ht 61.0 in | Wt 223.3 lb

## 2013-12-26 DIAGNOSIS — G309 Alzheimer's disease, unspecified: Principal | ICD-10-CM

## 2013-12-26 DIAGNOSIS — F0280 Dementia in other diseases classified elsewhere without behavioral disturbance: Secondary | ICD-10-CM | POA: Insufficient documentation

## 2013-12-26 LAB — VITAMIN B12: Vitamin B-12: 732 pg/mL (ref 211–911)

## 2013-12-26 LAB — TSH: TSH: 2.029 u[IU]/mL (ref 0.350–4.500)

## 2013-12-26 NOTE — Progress Notes (Signed)
NEUROLOGY CONSULTATION NOTE  Kara Ortega MRN: [id number] DOB: 01-14-1930  Referring provider: Dr. Tamala Julian Primary care provider: Dr. Tamala Julian  Reason for consult:  Memory problems.  Falls.  HISTORY OF PRESENT ILLNESS: Kara Ortega is an 80 year old right-handed woman with history of a-fib (on warfarin), CHF, unstable angina, hypertension, fibromyalgia, sleep apnea, basal cell carcinoma, benign essential tremor, hypothyroidism, hyperlipidemia, and type II diabetes with neuropathy who presents for memory problems and falls.  Records from PCP reviewed.  She is accompanied by her son  She began having memory problems 4 years ago, while living in [place], , however it has more noticeably progressed over the past 1 to 2 years.  She moved to [place] last year.  It started with short-term memory problems.  She will forget content of conversation.  She would repeat herself frequently.  She frequently repeats questions to reconfirm things.  She requires help with administering her medications.  Her children set up her medications for the week in a pillbox.  She usually takes it correctly, but infrequently, she will miss a dose, usually because she is confused about the day of the week, even though it is written on the box.  She has problems with math and gets confused calculating the amount of insulin she uses.  She has left the stove on in the past.  She usually has meals prepared by her children or she gets [place] take-out.  She will use the stove occasionally to cook soup.  She now finds that she will forget names of acquaintances, as well as some family such as her sister.  She seldom drives, usually only to the [employer] which is behind her house.  [place] [facility] [place] [facility]2 [place].  She eats breakfast, but will typically graze the rest of the day.  She is able to perform her ADLs and keeps the house clean and does the laundry.  She does have  difficulty with some simple tasks, such as operating the washer and dryer.  She no longer handles her finances or pays the bills (her son does it).  She lives by herself.  In the past, she used to see people in the house who weren't there, but this hasn't happened for at least a year.  She does not exhibit delusions.  She denies depression.  She does have difficulty sleeping at times.  She goes to bed around midnight and wakes up about 1.5 hours later.  She will keep herself occupied by cooking soup until about 4 am.  She will watch TV and drift in and out of sleep until getting up for the day around 11am.  She often spends most of the day sitting and watching TV.  She takes Exelon 9.5mg  patch.  Her sister has Alzheimer's.  Her maternal grandmother and aunt had dementia.  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  She has a master's degree in administration and supervision.  She also has recurrent falls.  He has had gait instability for many years due to imbalance.  Over the past year, she has had some falls.  Often she says her left knee gives out.  She also reports pain in the left knee.  She has some known mild weakness in the left leg and was told by her former neurologist that she may have had a small  stroke.Marland Kitchen    PAST MEDICAL HISTORY: Past Medical History  Diagnosis Date  . Abnormal heart rhythms   . Diabetes     insulin dependent  . Sleep apnea     uses bipap X12 years.  Marland Kitchen COPD (chronic obstructive pulmonary disease)   . CHF (congestive heart failure)   . A-fib   . Anginal pain   . Hypertension   . Stroke     LEFT SIDE RESIDUAL  . Shortness of breath   . Depression   . Chronic kidney disease     STAGE 3   . Memory loss     PAST SURGICAL HISTORY: Past Surgical History  Procedure Laterality Date  . Breast surgery      X4  . Abdominal surgery      removed 14 inches of colon  . Hernia  repair    . Breast lumpectomy      MEDICATIONS: Current Outpatient Prescriptions on File Prior to Visit  Medication Sig Dispense Refill  . albuterol (PROVENTIL HFA;VENTOLIN HFA) 108 (90 BASE) MCG/ACT inhaler Inhale 1-2 puffs into the lungs every 4 (four) hours as needed for wheezing or shortness of breath.  1 Inhaler  11  . albuterol (PROVENTIL) (2.5 MG/3ML) 0.083% nebulizer solution Take 2.5 mg by nebulization every 6 (six) hours as needed for wheezing or shortness of breath.      . allopurinol (ZYLOPRIM) 100 MG tablet Take 100 mg by mouth daily.      Marland Kitchen ALPRAZolam (XANAX) 0.25 MG tablet Take 0.25 mg by mouth 2 (two) times daily as needed for anxiety.      Marland Kitchen aspirin 81 MG chewable tablet Chew 81 mg by mouth daily.      Marland Kitchen atorvastatin (LIPITOR) 10 MG tablet Take 10 mg by mouth at bedtime.       Marland Kitchen b complex vitamins tablet Take 1 tablet by mouth daily.      . B Complex-C (B-COMPLEX WITH VITAMIN C) tablet Take 1 tablet by mouth daily.      . calcium carbonate (OS-CAL - DOSED IN MG OF ELEMENTAL CALCIUM) 1250 MG tablet Take 1 tablet by mouth daily with breakfast.      . celecoxib (CELEBREX) 200 MG capsule Take 200 mg by mouth daily.      . Cholecalciferol (HM VITAMIN D3) 4000 UNITS CAPS Take 12,000 Units by mouth daily.       . citalopram (CELEXA) 20 MG tablet Take 20 mg by mouth daily.      . colchicine 0.6 MG tablet Take 0.6 mg by mouth daily as needed (gout).      . colesevelam (WELCHOL) 625 MG tablet Take 1,875 mg by mouth 2 (two) times daily with a meal.      . diclofenac sodium (VOLTAREN) 1 % GEL Apply 4 g topically 4 (four) times daily as needed (pain).       Marland Kitchen diltiazem (TIAZAC) 240 MG 24 hr capsule Take 240 mg by mouth daily.      Marland Kitchen esomeprazole (NEXIUM) 40 MG capsule Take 40 mg by mouth daily.       . fenofibrate micronized (LOFIBRA) 134 MG capsule Take 134 mg by mouth daily before breakfast.      . fluticasone-salmeterol (ADVAIR HFA) 45-21 MCG/ACT inhaler Inhale 2 puffs into the lungs  2 (two) times daily as needed (congestion).      . furosemide (LASIX) 40 MG tablet Take 20 mg by mouth daily.      Marland Kitchen HYDROcodone-acetaminophen (NORCO/VICODIN) 5-325  MG per tablet Take 0.5 tablets by mouth every 6 (six) hours as needed for moderate pain.      Marland Kitchen insulin aspart (NOVOLOG FLEXPEN) 100 UNIT/ML FlexPen Inject 1-11 Units into the skin 3 (three) times daily with meals. 3 units plus 1 unit for every 15mg /dl over 75.      . Insulin Detemir (LEVEMIR FLEXTOUCH) 100 UNIT/ML Pen Inject 58 Units into the skin at bedtime.      . isosorbide mononitrate (IMDUR) 30 MG 24 hr tablet Take 30 mg by mouth every 12 (twelve) hours.      . metoprolol tartrate (LOPRESSOR) 25 MG tablet Take 25 mg by mouth 2 (two) times daily.      . Multiple Vitamin (MULTIVITAMIN WITH MINERALS) TABS tablet Take 1 tablet by mouth daily.      . nitroGLYCERIN (NITROSTAT) 0.4 MG SL tablet Place 0.4 mg under the tongue every 5 (five) minutes as needed for chest pain.       . potassium chloride (KLOR-CON) 20 MEQ packet Take 40 mEq by mouth daily.       . rivastigmine (EXELON) 9.5 mg/24hr Place 9.5 mg onto the skin daily.      . sitaGLIPtin-metformin (JANUMET) 50-500 MG per tablet Take 1 tablet by mouth 2 (two) times daily with a meal.      . spironolactone (ALDACTONE) 25 MG tablet Take 25 mg by mouth daily.      Marland Kitchen warfarin (COUMADIN) 5 MG tablet Take 5 mg by mouth daily.      Marland Kitchen zolpidem (AMBIEN) 5 MG tablet Take 5 mg by mouth at bedtime as needed for sleep.       No current facility-administered medications on file prior to visit.    ALLERGIES: Allergies  Allergen Reactions  . Avandia [Rosiglitazone] Other (See Comments)    edema  . Benazepril Other (See Comments)    unknown  . Dilaudid [Hydromorphone Hcl]     "does not tolerate strong pain medications" per pt  . Fosamax [Alendronate Sodium] Nausea And Vomiting  . Talwin [Pentazocine] Nausea And Vomiting  . Tetanus Toxoids     "allergic to something in the tetanus  injection" per patient  . Zyrtec [Cetirizine] Other (See Comments)    unknown    FAMILY HISTORY: Family History  Problem Relation Age of Onset  . Heart disease Mother   . Heart disease Sister   . Dementia Sister     SOCIAL HISTORY: History   Social History  . Marital Status: Single    Spouse Name: N/A    Number of Children: N/A  . Years of Education: N/A   Occupational History  . Not on file.   Social History Main Topics  . Smoking status: Former Smoker -- 1.50 packs/day for 18 years    Types: Cigarettes    Quit date: 03/31/1968  . Smokeless tobacco: Never Used  . Alcohol Use: No  . Drug Use: No  . Sexual Activity: No   Other Topics Concern  . Not on file   Social History Narrative  . No narrative on file    REVIEW OF SYSTEMS: Constitutional: No fevers, chills, or sweats, no generalized fatigue, change in appetite Eyes: No visual changes, double vision, eye pain Ear, nose and throat: No hearing loss, ear pain, nasal congestion, sore throat Cardiovascular: No chest pain, palpitations Respiratory:  No shortness of breath at rest or with exertion, wheezes GastrointestinaI: No nausea, vomiting, diarrhea, abdominal pain, fecal incontinence Genitourinary:  No dysuria, urinary retention  or frequency Musculoskeletal:  No neck pain, back pain Integumentary: No rash, pruritus, skin lesions Neurological: as above Psychiatric: No depression, insomnia, anxiety Endocrine: No palpitations, fatigue, diaphoresis, mood swings, change in appetite, change in weight, increased thirst Hematologic/Lymphatic:  No anemia, purpura, petechiae. Allergic/Immunologic: no itchy/runny eyes, nasal congestion, recent allergic reactions, rashes  PHYSICAL EXAM: Filed Vitals:   12/26/13 0803  BP: 128/70  Pulse: 68  Temp: 98 F (36.7 C)  Resp: 16   General: No acute distress Head:  Normocephalic/atraumatic Neck: supple, no paraspinal tenderness, full range of motion Back: No paraspinal  tenderness Heart: regular rate and rhythm Lungs: Clear to auscultation bilaterally. Vascular: No carotid bruits. Neurological Exam: Mental status: alert and oriented to person, place, and time, delayed recall poor, remote memory intact, fund of knowledge intact, attention and concentration intact Montreal Cognitive Assessment  12/26/2013  Visuospatial/ Executive (0/5) 2  Naming (0/3) 2  Attention: Read list of digits (0/2) 2  Attention: Read list of letters (0/1) 1  Attention: Serial 7 subtraction starting at 100 (0/3) 3  Language: Repeat phrase (0/2) 1  Language : Fluency (0/1) 0  Abstraction (0/2) 1  Delayed Recall (0/5) 0  Orientation (0/6) 6  Total 18  Adjusted Score (based on education) 18  Visuospatial and executive functioning was overall intact.  She was confused about how to do the trail making test.  Also, she placed the numbers 1, 3, 6 and 9 correctly on the clock but didn't include the other numbers. speech fluent and not dysarthric, language intact. Cranial nerves: CN I: not tested CN II: pupils equal, round and reactive to light, visual fields intact, fundi not visualized CN III, IV, VI:  full range of motion, no nystagmus, no ptosis CN V: facial sensation intact CN VII: upper and lower face symmetric CN VIII: hearing intact CN IX, X: gag intact, uvula midline CN XI: sternocleidomastoid and trapezius muscles intact CN XII: tongue midline Bulk & Tone: normal, no fasciculations. Motor: give-way weakness in the left hip flexion and ankle dorsiflexion.  Otherwise, 5/5. Sensation: reduced vibration in the feet.  Pinprick intact. Deep Tendon Reflexes: 1+ throughout except absent in the ankles.  Toes downgoing. Finger to nose testing: trace postural tremor and mild intention tremor Heel to shin: negative Gait: wide-based, unsteady. Cannot tandem walk.  Romberg positive.  IMPRESSION: Alzheimer's disease Recurrent falls, multifactorial, probably related to diabetic  neuropathy, possible residual weakness from prior stroke, arthritis in the knees or lumbar spine. Diabetes mellitus with neuropathy  PLAN: Continue Exelon 9.5mg  patch Check B12 and TSH Would not pursue further testing such as MRI regarding the gait instability as it would not likely change management (such as surgery as she is likely not a candidate).  Would focus on supportive care such as use of cane. Limit driving to [employer] and only to the [employer] [facility] or starting possible exercise classes for seniors so she may socialize. Advised not to use the stove at all. Follow up in 6 months.  Thank you for allowing me to take part in the care of this patient.  Metta Clines, DO  CC:  Carol Ada, MD

## 2013-12-26 NOTE — Patient Instructions (Signed)
1.  Continue Exelon patch 2.  I would limit driving to around [employer] and [facility] to [facility]. 3.  We will check B12 and TSH 4.  Use cane for ambulation 5.  Try and do word searches and brain teasers 6.  Try to socialize at a senior center 7.  We will provide numbers regarding support groups 8.  We will get notes from your neurologist in [facility] 9.  Follow up in 6 months.

## 2013-12-29 ENCOUNTER — Telehealth: Payer: Self-pay | Admitting: *Deleted

## 2013-12-29 NOTE — Telephone Encounter (Signed)
Son is aware that patient's labs were normal

## 2013-12-29 NOTE — Telephone Encounter (Signed)
Message copied by Claudie Revering on Thu Dec 29, 2013  1:13 PM ------      Message from: JAFFE, ADAM R      Created: Mon Dec 26, 2013  3:20 PM       Blood tests normal      ----- Message -----         From: [facility]         Sent: 12/26/2013   3:07 PM           To: Dudley Major, DO                   ------

## 2014-01-06 ENCOUNTER — Ambulatory Visit (INDEPENDENT_AMBULATORY_CARE_PROVIDER_SITE_OTHER): Payer: Medicare Other | Admitting: Podiatry

## 2014-01-06 ENCOUNTER — Encounter: Payer: Self-pay | Admitting: Podiatry

## 2014-01-06 VITALS — BP 117/61 | HR 72 | Resp 15 | Ht 61.0 in | Wt 223.0 lb

## 2014-01-06 DIAGNOSIS — E1140 Type 2 diabetes mellitus with diabetic neuropathy, unspecified: Secondary | ICD-10-CM

## 2014-01-06 DIAGNOSIS — E1149 Type 2 diabetes mellitus with other diabetic neurological complication: Secondary | ICD-10-CM

## 2014-01-06 DIAGNOSIS — M79676 Pain in unspecified toe(s): Secondary | ICD-10-CM

## 2014-01-06 DIAGNOSIS — B351 Tinea unguium: Secondary | ICD-10-CM

## 2014-01-06 NOTE — Patient Instructions (Signed)
Diabetes and Foot Care Diabetes may cause you to have problems because of poor blood supply (circulation) to your feet and legs. This may cause the skin on your feet to become thinner, break easier, and heal more slowly. Your skin may become dry, and the skin may peel and crack. You may also have nerve damage in your legs and feet causing decreased feeling in them. You may not notice minor injuries to your feet that could lead to infections or more serious problems. Taking care of your feet is one of the most important things you can do for yourself.  HOME CARE INSTRUCTIONS  Wear shoes at all times, even in the house. Do not go barefoot. Bare feet are easily injured.  Check your feet daily for blisters, cuts, and redness. If you cannot see the bottom of your feet, use a mirror or ask someone for help.  Wash your feet with warm water (do not use hot water) and mild soap. Then pat your feet and the areas between your toes until they are completely dry. Do not soak your feet as this can dry your skin.  Apply a moisturizing lotion or petroleum jelly (that does not contain alcohol and is unscented) to the skin on your feet and to dry, brittle toenails. Do not apply lotion between your toes.  Trim your toenails straight across. Do not dig under them or around the cuticle. File the edges of your nails with an emery board or nail file.  Do not cut corns or calluses or try to remove them with medicine.  Wear clean socks or stockings every day. Make sure they are not too tight. Do not wear knee-high stockings since they may decrease blood flow to your legs.  Wear shoes that fit properly and have enough cushioning. To break in new shoes, wear them for just a few hours a day. This prevents you from injuring your feet. Always look in your shoes before you put them on to be sure there are no objects inside.  Do not cross your legs. This may decrease the blood flow to your feet.  If you find a minor scrape,  cut, or break in the skin on your feet, keep it and the skin around it clean and dry. These areas may be cleansed with mild soap and water. Do not cleanse the area with peroxide, alcohol, or iodine.  When you remove an adhesive bandage, be sure not to damage the skin around it.  If you have a wound, look at it several times a day to make sure it is healing.  Do not use heating pads or hot water bottles. They may burn your skin. If you have lost feeling in your feet or legs, you may not know it is happening until it is too late.  Make sure your health care provider performs a complete foot exam at least annually or more often if you have foot problems. Report any cuts, sores, or bruises to your health care provider immediately. SEEK MEDICAL CARE IF:   You have an injury that is not healing.  You have cuts or breaks in the skin.  You have an ingrown nail.  You notice redness on your legs or feet.  You feel burning or tingling in your legs or feet.  You have pain or cramps in your legs and feet.  Your legs or feet are numb.  Your feet always feel cold. SEEK IMMEDIATE MEDICAL CARE IF:   There is increasing redness,   swelling, or pain in or around a wound.  There is a red line that goes up your leg.  Pus is coming from a wound.  You develop a fever or as directed by your health care provider.  You notice a bad smell coming from an ulcer or wound. Document Released: 03/14/2000 Document Revised: 11/17/2012 Document Reviewed: 08/24/2012 ExitCare Patient Information 2015 [employer]. This information is not intended to replace advice given to you by your health care provider. Make sure you discuss any questions you have with your health care provider.  

## 2014-01-06 NOTE — Progress Notes (Signed)
   Subjective:    Patient ID: Kara Ortega, female    DOB: [birth year], 80 y.o.   MRN: [id number]  HPI Comments: Kara Ortega, 80 year old female presents the office today for diabetic risk assessment as well as burning pain to bilateral feet daily which has been ongoing. Per the patient's son are here to get established as she is new or to the [place] area. Also asking about diabetic shoes. She has bilateral lower extremity pain for which he states he uses voltaren gel. Also states that she performs daily exercises to help strengthen her tendons. No other complaints at this time.      Review of Systems  Cardiovascular: Positive for palpitations and leg swelling.  Endocrine: Positive for polydipsia and polyuria.  Genitourinary: Positive for urgency.  Musculoskeletal: Positive for arthralgias and gait problem.  Neurological: Positive for tremors and numbness.  Hematological: Bruises/bleeds easily.       Slow to heal.  Psychiatric/Behavioral: Positive for confusion.  All other systems reviewed and are negative.      Objective:   Physical Exam AAO x3, NAD DP/PT pulses palpable, CRT less than 3 seconds Decreased protective sensation with SWMF, decreased protective sensation, decreased Achilles tendon reflex No open lesions, preulcerative lesions  Interdigital maceration to the left first interspace. No overlying erythema, drainage, or clinical signs of infection.  Mild bilateral lower extremity edema with left slightly greater than right. There is no calf pain with compression or increased warmth/erythema over the area.  Nails hypertrophic, dystrophic, elongated, discolored x 8. Nails on left hallux and 2nd digit previously have been removed. MMT decreased, ROM WNL and without symptoms. No pinpoint bony tenderness.  No edema, erythema, or increased warmth over the left first digit to suggest gout.     Assessment & Plan:  80 year old female presents for diabetic risk assessment,  symptomatic onychomycosis, neuropathy -Various treatment options discussed including alternatives, risks, complications. -Discussed with the patient the importance of daily foot inspection. -Nail sharply debrided x8 without complication. -Will work on getting diabetic shoes precertified. She was scanned today so they are approved they can be sent off. -Discussed how to keep the interdigital space is dry. -Followup in 3 months or sooner if any problems are to arise or any changes symptoms. Patient's son states that he would like for her to come in at least yearly but he will likely hold off for longer than 3 months. Discussed if any changes to call the office immediately.

## 2014-02-03 ENCOUNTER — Institutional Professional Consult (permissible substitution): Payer: Medicare Other | Admitting: Pulmonary Disease

## 2014-02-09 ENCOUNTER — Ambulatory Visit
Admission: RE | Admit: 2014-02-09 | Discharge: 2014-02-09 | Disposition: A | Discharge status: Home / Self Care | Payer: Medicare Other | Source: Ambulatory Visit | Attending: Family Medicine | Admitting: Family Medicine

## 2014-02-09 ENCOUNTER — Other Ambulatory Visit: Payer: Self-pay | Admitting: Family Medicine

## 2014-02-09 DIAGNOSIS — M25551 Pain in right hip: Secondary | ICD-10-CM

## 2014-02-13 ENCOUNTER — Other Ambulatory Visit: Payer: Self-pay | Admitting: Family Medicine

## 2014-02-13 DIAGNOSIS — M5417 Radiculopathy, lumbosacral region: Secondary | ICD-10-CM

## 2014-02-21 ENCOUNTER — Institutional Professional Consult (permissible substitution): Payer: Medicare Other | Admitting: Pulmonary Disease

## 2014-02-21 ENCOUNTER — Inpatient Hospital Stay: Admission: RE | Admit: 2014-02-21 | Payer: Medicare Other | Source: Ambulatory Visit

## 2014-03-02 ENCOUNTER — Other Ambulatory Visit: Payer: Medicare Other

## 2014-03-04 ENCOUNTER — Ambulatory Visit
Admission: RE | Admit: 2014-03-04 | Discharge: 2014-03-04 | Disposition: A | Discharge status: Home / Self Care | Payer: Medicare Other | Source: Ambulatory Visit | Attending: Family Medicine | Admitting: Family Medicine

## 2014-03-04 DIAGNOSIS — M5417 Radiculopathy, lumbosacral region: Secondary | ICD-10-CM

## 2014-03-23 ENCOUNTER — Encounter (HOSPITAL_COMMUNITY): Payer: Self-pay

## 2014-03-23 ENCOUNTER — Emergency Department (HOSPITAL_COMMUNITY): Payer: Medicare Other

## 2014-03-23 ENCOUNTER — Inpatient Hospital Stay (HOSPITAL_COMMUNITY)
Admission: EM | Admit: 2014-03-23 | Discharge: 2014-03-27 | DRG: 683 | Disposition: A | Discharge status: Home Health | Payer: Medicare Other | Attending: Internal Medicine | Admitting: Internal Medicine

## 2014-03-23 DIAGNOSIS — I080 Rheumatic disorders of both mitral and aortic valves: Secondary | ICD-10-CM | POA: Diagnosis present

## 2014-03-23 DIAGNOSIS — E118 Type 2 diabetes mellitus with unspecified complications: Secondary | ICD-10-CM

## 2014-03-23 DIAGNOSIS — Z7951 Long term (current) use of inhaled steroids: Secondary | ICD-10-CM | POA: Diagnosis not present

## 2014-03-23 DIAGNOSIS — F329 Major depressive disorder, single episode, unspecified: Secondary | ICD-10-CM | POA: Diagnosis present

## 2014-03-23 DIAGNOSIS — Z8249 Family history of ischemic heart disease and other diseases of the circulatory system: Secondary | ICD-10-CM

## 2014-03-23 DIAGNOSIS — R0600 Dyspnea, unspecified: Secondary | ICD-10-CM

## 2014-03-23 DIAGNOSIS — G8194 Hemiplegia, unspecified affecting left nondominant side: Secondary | ICD-10-CM | POA: Diagnosis present

## 2014-03-23 DIAGNOSIS — N179 Acute kidney failure, unspecified: Principal | ICD-10-CM | POA: Diagnosis present

## 2014-03-23 DIAGNOSIS — R001 Bradycardia, unspecified: Secondary | ICD-10-CM | POA: Diagnosis present

## 2014-03-23 DIAGNOSIS — R0609 Other forms of dyspnea: Secondary | ICD-10-CM

## 2014-03-23 DIAGNOSIS — Z7901 Long term (current) use of anticoagulants: Secondary | ICD-10-CM

## 2014-03-23 DIAGNOSIS — I129 Hypertensive chronic kidney disease with stage 1 through stage 4 chronic kidney disease, or unspecified chronic kidney disease: Secondary | ICD-10-CM | POA: Diagnosis present

## 2014-03-23 DIAGNOSIS — E1140 Type 2 diabetes mellitus with diabetic neuropathy, unspecified: Secondary | ICD-10-CM | POA: Diagnosis present

## 2014-03-23 DIAGNOSIS — N189 Chronic kidney disease, unspecified: Secondary | ICD-10-CM | POA: Diagnosis present

## 2014-03-23 DIAGNOSIS — I482 Chronic atrial fibrillation: Secondary | ICD-10-CM

## 2014-03-23 DIAGNOSIS — J449 Chronic obstructive pulmonary disease, unspecified: Secondary | ICD-10-CM | POA: Diagnosis present

## 2014-03-23 DIAGNOSIS — G309 Alzheimer's disease, unspecified: Secondary | ICD-10-CM | POA: Diagnosis present

## 2014-03-23 DIAGNOSIS — I4891 Unspecified atrial fibrillation: Secondary | ICD-10-CM | POA: Diagnosis present

## 2014-03-23 DIAGNOSIS — I350 Nonrheumatic aortic (valve) stenosis: Secondary | ICD-10-CM | POA: Diagnosis present

## 2014-03-23 DIAGNOSIS — E860 Dehydration: Secondary | ICD-10-CM | POA: Diagnosis present

## 2014-03-23 DIAGNOSIS — Z794 Long term (current) use of insulin: Secondary | ICD-10-CM

## 2014-03-23 DIAGNOSIS — G4733 Obstructive sleep apnea (adult) (pediatric): Secondary | ICD-10-CM | POA: Diagnosis present

## 2014-03-23 DIAGNOSIS — F0280 Dementia in other diseases classified elsewhere without behavioral disturbance: Secondary | ICD-10-CM | POA: Diagnosis present

## 2014-03-23 DIAGNOSIS — I050 Rheumatic mitral stenosis: Secondary | ICD-10-CM

## 2014-03-23 DIAGNOSIS — Z7982 Long term (current) use of aspirin: Secondary | ICD-10-CM

## 2014-03-23 DIAGNOSIS — W182XXA Fall in (into) shower or empty bathtub, initial encounter: Secondary | ICD-10-CM | POA: Diagnosis present

## 2014-03-23 DIAGNOSIS — I5032 Chronic diastolic (congestive) heart failure: Secondary | ICD-10-CM | POA: Diagnosis present

## 2014-03-23 DIAGNOSIS — I959 Hypotension, unspecified: Secondary | ICD-10-CM | POA: Diagnosis present

## 2014-03-23 DIAGNOSIS — E119 Type 2 diabetes mellitus without complications: Secondary | ICD-10-CM

## 2014-03-23 DIAGNOSIS — Z87891 Personal history of nicotine dependence: Secondary | ICD-10-CM | POA: Diagnosis not present

## 2014-03-23 DIAGNOSIS — W19XXXA Unspecified fall, initial encounter: Secondary | ICD-10-CM | POA: Diagnosis present

## 2014-03-23 DIAGNOSIS — R296 Repeated falls: Secondary | ICD-10-CM | POA: Diagnosis present

## 2014-03-23 DIAGNOSIS — I480 Paroxysmal atrial fibrillation: Secondary | ICD-10-CM | POA: Diagnosis present

## 2014-03-23 HISTORY — DX: Dementia in other diseases classified elsewhere, unspecified severity, without behavioral disturbance, psychotic disturbance, mood disturbance, and anxiety: F02.80

## 2014-03-23 HISTORY — DX: Alzheimer's disease, unspecified: G30.9

## 2014-03-23 HISTORY — DX: Polyneuropathy, unspecified: G62.9

## 2014-03-23 HISTORY — DX: Dorsalgia, unspecified: M54.9

## 2014-03-23 HISTORY — DX: Pain in leg, unspecified: M79.606

## 2014-03-23 LAB — CBC WITH DIFFERENTIAL/PLATELET
BASOS PCT: 0 % (ref 0–1)
Basophils Absolute: 0 10*3/uL (ref 0.0–0.1)
EOS ABS: 0.1 10*3/uL (ref 0.0–0.7)
EOS PCT: 1 % (ref 0–5)
HCT: 41.6 % (ref 36.0–46.0)
Hemoglobin: 13.7 g/dL (ref 12.0–15.0)
LYMPHS ABS: 2.4 10*3/uL (ref 0.7–4.0)
Lymphocytes Relative: 26 % (ref 12–46)
MCH: 28.5 pg (ref 26.0–34.0)
MCHC: 32.9 g/dL (ref 30.0–36.0)
MCV: 86.7 fL (ref 78.0–100.0)
MONOS PCT: 9 % (ref 3–12)
Monocytes Absolute: 0.8 10*3/uL (ref 0.1–1.0)
NEUTROS PCT: 64 % (ref 43–77)
Neutro Abs: 5.9 10*3/uL (ref 1.7–7.7)
PLATELETS: 201 10*3/uL (ref 150–400)
RBC: 4.8 MIL/uL (ref 3.87–5.11)
RDW: 16.1 % — ABNORMAL HIGH (ref 11.5–15.5)
WBC: 9.2 10*3/uL (ref 4.0–10.5)

## 2014-03-23 LAB — URINE MICROSCOPIC-ADD ON

## 2014-03-23 LAB — I-STAT VENOUS BLOOD GAS, ED
Acid-base deficit: 2 mmol/L (ref 0.0–2.0)
Bicarbonate: 24.6 mEq/L — ABNORMAL HIGH (ref 20.0–24.0)
O2 SAT: 78 %
TCO2: 26 mmol/L (ref 0–100)
pCO2, Ven: 47.3 mmHg (ref 45.0–50.0)
pH, Ven: 7.324 — ABNORMAL HIGH (ref 7.250–7.300)
pO2, Ven: 46 mmHg — ABNORMAL HIGH (ref 30.0–45.0)

## 2014-03-23 LAB — COMPREHENSIVE METABOLIC PANEL
ALBUMIN: 4.1 g/dL (ref 3.5–5.2)
ALT: 29 U/L (ref 0–35)
ANION GAP: 11 (ref 5–15)
AST: 39 U/L — ABNORMAL HIGH (ref 0–37)
Alkaline Phosphatase: 38 U/L — ABNORMAL LOW (ref 39–117)
BUN: 54 mg/dL — ABNORMAL HIGH (ref 6–23)
CO2: 24 mmol/L (ref 19–32)
Calcium: 10.8 mg/dL — ABNORMAL HIGH (ref 8.4–10.5)
Chloride: 100 mEq/L (ref 96–112)
Creatinine, Ser: 2.02 mg/dL — ABNORMAL HIGH (ref 0.50–1.10)
GFR calc non Af Amer: 22 mL/min — ABNORMAL LOW (ref >90)
GFR, EST AFRICAN AMERICAN: 25 mL/min — AB (ref >90)
Glucose, Bld: 187 mg/dL — ABNORMAL HIGH (ref 70–99)
Potassium: 4.5 mmol/L (ref 3.5–5.1)
Sodium: 135 mmol/L (ref 135–145)
TOTAL PROTEIN: 6.5 g/dL (ref 6.0–8.3)
Total Bilirubin: 0.2 mg/dL — ABNORMAL LOW (ref 0.3–1.2)

## 2014-03-23 LAB — BRAIN NATRIURETIC PEPTIDE: B Natriuretic Peptide: 107 pg/mL — ABNORMAL HIGH (ref 0.0–100.0)

## 2014-03-23 LAB — URINALYSIS, ROUTINE W REFLEX MICROSCOPIC
Glucose, UA: NEGATIVE mg/dL
Hgb urine dipstick: NEGATIVE
Ketones, ur: NEGATIVE mg/dL
LEUKOCYTES UA: NEGATIVE
NITRITE: NEGATIVE
Protein, ur: 30 mg/dL — AB
Specific Gravity, Urine: 1.019 (ref 1.005–1.030)
UROBILINOGEN UA: 0.2 mg/dL (ref 0.0–1.0)
pH: 5 (ref 5.0–8.0)

## 2014-03-23 LAB — CBG MONITORING, ED: Glucose-Capillary: 173 mg/dL — ABNORMAL HIGH (ref 70–99)

## 2014-03-23 LAB — MRSA PCR SCREENING: MRSA BY PCR: NEGATIVE

## 2014-03-23 LAB — TROPONIN I

## 2014-03-23 LAB — PROTIME-INR
INR: 3.13 — ABNORMAL HIGH (ref 0.00–1.49)
PROTHROMBIN TIME: 32.5 s — AB (ref 11.6–15.2)

## 2014-03-23 LAB — GLUCOSE, CAPILLARY: GLUCOSE-CAPILLARY: 110 mg/dL — AB (ref 70–99)

## 2014-03-23 MED ORDER — INSULIN ASPART 100 UNIT/ML ~~LOC~~ SOLN
0.0000 [IU] | Freq: Every day | SUBCUTANEOUS | Status: DC
  Administered 2014-03-24: 2 [IU] via SUBCUTANEOUS
  Administered 2014-03-25 – 2014-03-26 (×2): 4 [IU] via SUBCUTANEOUS

## 2014-03-23 MED ORDER — SODIUM CHLORIDE 0.9 % IJ SOLN
3.0000 mL | Freq: Two times a day (BID) | INTRAMUSCULAR | Status: DC
Start: 2014-03-23 — End: 2014-03-27
  Administered 2014-03-24 – 2014-03-27 (×7): 3 mL via INTRAVENOUS

## 2014-03-23 MED ORDER — ONDANSETRON HCL 4 MG PO TABS: 4.0000 mg | ORAL_TABLET | Freq: Four times a day (QID) | ORAL | Status: DC | PRN

## 2014-03-23 MED ORDER — ONDANSETRON HCL 4 MG/2ML IJ SOLN: 4.0000 mg | Freq: Four times a day (QID) | INTRAMUSCULAR | Status: DC | PRN

## 2014-03-23 MED ORDER — FEBUXOSTAT 40 MG PO TABS
40.0000 mg | ORAL_TABLET | Freq: Every day | ORAL | Status: DC
  Administered 2014-03-24 – 2014-03-27 (×4): 40 mg via ORAL
  Filled 2014-03-23 (×5): qty 1

## 2014-03-23 MED ORDER — RIVASTIGMINE 9.5 MG/24HR TD PT24
9.5000 mg | MEDICATED_PATCH | Freq: Every day | TRANSDERMAL | Status: DC
  Administered 2014-03-23 – 2014-03-27 (×5): 9.5 mg via TRANSDERMAL
  Filled 2014-03-23 (×5): qty 1

## 2014-03-23 MED ORDER — INSULIN DETEMIR 100 UNIT/ML FLEXPEN: 64.0000 [IU] | PEN_INJECTOR | Freq: Every day | SUBCUTANEOUS | Status: DC

## 2014-03-23 MED ORDER — COLESEVELAM HCL 625 MG PO TABS
1875.0000 mg | ORAL_TABLET | Freq: Two times a day (BID) | ORAL | Status: DC
  Administered 2014-03-24 – 2014-03-27 (×7): 1875 mg via ORAL
  Filled 2014-03-23 (×10): qty 3

## 2014-03-23 MED ORDER — PREGABALIN 75 MG PO CAPS
75.0000 mg | ORAL_CAPSULE | Freq: Two times a day (BID) | ORAL | Status: DC
  Administered 2014-03-23 – 2014-03-27 (×8): 75 mg via ORAL
  Filled 2014-03-23 (×2): qty 1
  Filled 2014-03-23 (×2): qty 3
  Filled 2014-03-23: qty 1
  Filled 2014-03-23: qty 3
  Filled 2014-03-23 (×2): qty 1

## 2014-03-23 MED ORDER — ALBUTEROL SULFATE (2.5 MG/3ML) 0.083% IN NEBU: 2.5000 mg | INHALATION_SOLUTION | RESPIRATORY_TRACT | Status: DC | PRN

## 2014-03-23 MED ORDER — PANTOPRAZOLE SODIUM 40 MG PO TBEC
40.0000 mg | DELAYED_RELEASE_TABLET | Freq: Every day | ORAL | Status: DC
  Administered 2014-03-24 – 2014-03-27 (×4): 40 mg via ORAL
  Filled 2014-03-23 (×4): qty 1

## 2014-03-23 MED ORDER — INSULIN ASPART 100 UNIT/ML ~~LOC~~ SOLN
0.0000 [IU] | Freq: Three times a day (TID) | SUBCUTANEOUS | Status: DC
  Administered 2014-03-24: 1 [IU] via SUBCUTANEOUS
  Administered 2014-03-24 (×2): 3 [IU] via SUBCUTANEOUS
  Administered 2014-03-25: 2 [IU] via SUBCUTANEOUS
  Administered 2014-03-26 (×3): 3 [IU] via SUBCUTANEOUS
  Administered 2014-03-26 – 2014-03-27 (×2): 2 [IU] via SUBCUTANEOUS
  Administered 2014-03-27: 5 [IU] via SUBCUTANEOUS

## 2014-03-23 MED ORDER — ALBUTEROL SULFATE HFA 108 (90 BASE) MCG/ACT IN AERS
1.0000 | INHALATION_SPRAY | RESPIRATORY_TRACT | Status: DC | PRN
Start: 2014-03-23 — End: 2014-03-23

## 2014-03-23 MED ORDER — INSULIN DETEMIR 100 UNIT/ML ~~LOC~~ SOLN
64.0000 [IU] | Freq: Every day | SUBCUTANEOUS | Status: DC
  Administered 2014-03-23 – 2014-03-25 (×2): 64 [IU] via SUBCUTANEOUS
  Filled 2014-03-23 (×3): qty 0.64

## 2014-03-23 MED ORDER — CITALOPRAM HYDROBROMIDE 20 MG PO TABS
20.0000 mg | ORAL_TABLET | Freq: Every day | ORAL | Status: DC
  Administered 2014-03-24 – 2014-03-27 (×4): 20 mg via ORAL
  Filled 2014-03-23 (×5): qty 1

## 2014-03-23 MED ORDER — SODIUM CHLORIDE 0.9 % IV BOLUS (SEPSIS)
500.0000 mL | Freq: Once | INTRAVENOUS | Status: AC
  Administered 2014-03-23: 500 mL via INTRAVENOUS

## 2014-03-23 MED ORDER — NITROGLYCERIN 0.4 MG SL SUBL
0.4000 mg | SUBLINGUAL_TABLET | SUBLINGUAL | Status: DC | PRN
  Filled 2014-03-23: qty 1

## 2014-03-23 MED ORDER — CETYLPYRIDINIUM CHLORIDE 0.05 % MT LIQD
7.0000 mL | Freq: Two times a day (BID) | OROMUCOSAL | Status: DC
  Administered 2014-03-23 – 2014-03-27 (×7): 7 mL via OROMUCOSAL

## 2014-03-23 MED ORDER — ISOSORBIDE MONONITRATE ER 30 MG PO TB24
30.0000 mg | ORAL_TABLET | Freq: Two times a day (BID) | ORAL | Status: DC
  Filled 2014-03-23 (×3): qty 1

## 2014-03-23 MED ORDER — ACETAMINOPHEN 325 MG PO TABS
650.0000 mg | ORAL_TABLET | Freq: Four times a day (QID) | ORAL | Status: DC | PRN
  Administered 2014-03-25 – 2014-03-27 (×2): 650 mg via ORAL
  Filled 2014-03-23 (×2): qty 2

## 2014-03-23 MED ORDER — ATORVASTATIN CALCIUM 10 MG PO TABS
10.0000 mg | ORAL_TABLET | Freq: Every day | ORAL | Status: DC
  Administered 2014-03-23 – 2014-03-26 (×4): 10 mg via ORAL
  Filled 2014-03-23 (×5): qty 1

## 2014-03-23 MED ORDER — ACETAMINOPHEN 650 MG RE SUPP: 650.0000 mg | Freq: Four times a day (QID) | RECTAL | Status: DC | PRN

## 2014-03-23 MED ORDER — ALPRAZOLAM 0.25 MG PO TABS
0.2500 mg | ORAL_TABLET | Freq: Two times a day (BID) | ORAL | Status: DC | PRN
Start: 2014-03-23 — End: 2014-03-27
  Administered 2014-03-26 (×2): 0.25 mg via ORAL
  Filled 2014-03-23 (×3): qty 1

## 2014-03-23 NOTE — ED Notes (Signed)
Per [facility] pt was found in shower after falling. Pt c/o of head and neck pain. Pt would not tolerate c-collar so [facility] applied and secured towels to stabilize the pts neck. No report of loss of consciousness. Pt reported experiencing dizziness prior to falling. No obvious deformities noted.

## 2014-03-23 NOTE — ED Provider Notes (Signed)
CSN: [id number]     Arrival date & time 03/23/14  1629 History   First MD Initiated Contact with Patient 03/23/14 1631     Chief Complaint  Patient presents with  . Fall  . Neck Pain     (Consider location/radiation/quality/duration/timing/severity/associated sxs/prior Treatment) Patient is a 80 y.o. female presenting with fall.  Fall This is a new problem. The current episode started 1 to 2 hours ago. The problem occurs constantly. The problem has not changed since onset.Associated symptoms include chest pain (chronically, twinges this am). Pertinent negatives include no abdominal pain, no headaches and no shortness of breath. Nothing aggravates the symptoms. Nothing relieves the symptoms. She has tried nothing for the symptoms.    Past Medical History  Diagnosis Date  . Abnormal heart rhythms   . Diabetes     insulin dependent  . Sleep apnea     uses bipap X12 years.  Marland Kitchen COPD (chronic obstructive pulmonary disease)   . CHF (congestive heart failure)   . A-fib   . Anginal pain   . Hypertension   . Stroke     LEFT SIDE RESIDUAL  . Shortness of breath   . Depression   . Chronic kidney disease     STAGE 3   . Memory loss   . Alzheimer disease   . Leg pain   . Back pain   . Neuropathy    Past Surgical History  Procedure Laterality Date  . Breast surgery      X4  . Abdominal surgery      removed 14 inches of colon  . Hernia repair    . Breast lumpectomy     Family History  Problem Relation Age of Onset  . Heart disease Mother   . Heart disease Sister   . Dementia Sister    History  Substance Use Topics  . Smoking status: Former Smoker -- 1.50 packs/day for 18 years    Types: Cigarettes    Quit date: 03/31/1968  . Smokeless tobacco: Never Used  . Alcohol Use: No   OB History    No data available     Review of Systems  Respiratory: Negative for shortness of breath.   Cardiovascular: Positive for chest pain (chronically, twinges this am).   Gastrointestinal: Negative for abdominal pain.  Neurological: Negative for headaches.  All other systems reviewed and are negative.     Allergies  Avandia; Benazepril; Dilaudid; Fosamax; Talwin; Tetanus toxoids; and Zyrtec  Home Medications   Prior to Admission medications   Medication Sig Start Date End Date Taking? Authorizing Provider  albuterol (PROVENTIL HFA;VENTOLIN HFA) 108 (90 BASE) MCG/ACT inhaler Inhale 1-2 puffs into the lungs every 4 (four) hours as needed for wheezing or shortness of breath. 03/25/13  Yes Kathee Delton, MD  albuterol (PROVENTIL) (2.5 MG/3ML) 0.083% nebulizer solution Take 2.5 mg by nebulization every 6 (six) hours as needed for wheezing or shortness of breath.   Yes Historical Provider, MD  ALPRAZolam (XANAX) 0.25 MG tablet Take 0.25 mg by mouth 2 (two) times daily as needed for anxiety.   Yes Historical Provider, MD  atorvastatin (LIPITOR) 10 MG tablet Take 10 mg by mouth at bedtime.    Yes Historical Provider, MD  b complex vitamins tablet Take 1 tablet by mouth daily.   Yes Historical Provider, MD  B Complex-C (B-COMPLEX WITH VITAMIN C) tablet Take 1 tablet by mouth daily.   Yes Historical Provider, MD  calcium carbonate (OS-CAL - DOSED IN MG  OF ELEMENTAL CALCIUM) 1250 MG tablet Take 1 tablet by mouth daily with breakfast.   Yes Historical Provider, MD  celecoxib (CELEBREX) 200 MG capsule Take 200 mg by mouth daily.   Yes Historical Provider, MD  Cholecalciferol (HM VITAMIN D3) 4000 UNITS CAPS Take 12,000 Units by mouth daily.    Yes Historical Provider, MD  citalopram (CELEXA) 20 MG tablet Take 20 mg by mouth daily.   Yes Historical Provider, MD  colchicine 0.6 MG tablet Take 0.6 mg by mouth daily as needed (gout).   Yes Historical Provider, MD  colesevelam (WELCHOL) 625 MG tablet Take 1,875 mg by mouth 2 (two) times daily with a meal.   Yes Historical Provider, MD  diclofenac sodium (VOLTAREN) 1 % GEL Apply 4 g topically 4 (four) times daily as needed  (pain).    Yes Historical Provider, MD  diltiazem (TIAZAC) 240 MG 24 hr capsule Take 240 mg by mouth daily.   Yes Historical Provider, MD  febuxostat (ULORIC) 40 MG tablet Take 40 mg by mouth daily.   Yes Historical Provider, MD  fenofibrate micronized (LOFIBRA) 134 MG capsule Take 134 mg by mouth daily before breakfast.   Yes Historical Provider, MD  furosemide (LASIX) 40 MG tablet Take 20 mg by mouth daily.    Yes Historical Provider, MD  Insulin Detemir (LEVEMIR FLEXTOUCH) 100 UNIT/ML Pen Inject 64 Units into the skin at bedtime.    Yes Historical Provider, MD  isosorbide mononitrate (IMDUR) 30 MG 24 hr tablet Take 30 mg by mouth every 12 (twelve) hours.   Yes Historical Provider, MD  Liraglutide (VICTOZA) 18 MG/3ML SOPN Inject 1.2 mg into the skin every morning.   Yes Historical Provider, MD  losartan (COZAAR) 25 MG tablet Take 25 mg by mouth daily.   Yes Historical Provider, MD  metoprolol succinate (TOPROL-XL) 25 MG 24 hr tablet Take 25 mg by mouth daily.   Yes Historical Provider, MD  Multiple Vitamin (MULTIVITAMIN WITH MINERALS) TABS tablet Take 1 tablet by mouth daily.   Yes Historical Provider, MD  nitroGLYCERIN (NITROSTAT) 0.4 MG SL tablet Place 0.4 mg under the tongue every 5 (five) minutes as needed for chest pain.    Yes Historical Provider, MD  pantoprazole (PROTONIX) 40 MG tablet Take 40 mg by mouth daily.   Yes Historical Provider, MD  potassium chloride (KLOR-CON) 20 MEQ packet Take 40 mEq by mouth daily.    Yes Historical Provider, MD  pregabalin (LYRICA) 75 MG capsule Take 75 mg by mouth 2 (two) times daily.   Yes Historical Provider, MD  rivastigmine (EXELON) 9.5 mg/24hr Place 9.5 mg onto the skin daily.   Yes Historical Provider, MD  spironolactone (ALDACTONE) 25 MG tablet Take 25 mg by mouth daily.   Yes Historical Provider, MD  warfarin (COUMADIN) 5 MG tablet Take 2.5-5 mg by mouth See admin instructions. 5 mg every day except on Tuesday, Thursday, Saturday patient takes 2.5  mg   Yes Historical Provider, MD  zolpidem (AMBIEN) 5 MG tablet Take 5 mg by mouth at bedtime as needed for sleep.   Yes Historical Provider, MD  allopurinol (ZYLOPRIM) 100 MG tablet Take 100 mg by mouth daily.    Historical Provider, MD  aspirin 81 MG chewable tablet Chew 81 mg by mouth daily.    Historical Provider, MD  esomeprazole (NEXIUM) 40 MG capsule Take 40 mg by mouth daily.     Historical Provider, MD  HYDROcodone-acetaminophen (NORCO/VICODIN) 5-325 MG per tablet Take 0.5 tablets by mouth every  6 (six) hours as needed for moderate pain.    Historical Provider, MD   BP 115/99 mmHg  Pulse 74  Temp(Src) 98.6 F (37 C) (Oral)  Resp 21  Ht 5\' 1"  (1.549 m)  Wt 230 lb 2.6 oz (104.4 kg)  BMI 43.51 kg/m2  SpO2 93% Physical Exam  Constitutional: She is oriented to person, place, and time. She appears well-developed and well-nourished.  HENT:  Head: Normocephalic and atraumatic.  Right Ear: External ear normal.  Left Ear: External ear normal.  Eyes: Conjunctivae and EOM are normal. Pupils are equal, round, and reactive to light.  Neck: Normal range of motion. Neck supple.  Cardiovascular: Normal rate, regular rhythm, normal heart sounds and intact distal pulses.   Pulmonary/Chest: Effort normal and breath sounds normal.  Abdominal: Soft. Bowel sounds are normal. There is no tenderness.  Musculoskeletal: Normal range of motion.  Tenderness over chest wall, L shoulder, bil ankles, without external signs of trauma.  Also tenderness diffusely of spine  Neurological: She is alert and oriented to person, place, and time. She has normal strength. No cranial nerve deficit or sensory deficit.  Skin: Skin is warm and dry.  Vitals reviewed.   ED Course  Procedures (including critical care time) Labs Review Labs Reviewed  CBC WITH DIFFERENTIAL - Abnormal; Notable for the following:    RDW 16.1 (*)    All other components within normal limits  COMPREHENSIVE METABOLIC PANEL - Abnormal;  Notable for the following:    Glucose, Bld 187 (*)    BUN 54 (*)    Creatinine, Ser 2.02 (*)    Calcium 10.8 (*)    AST 39 (*)    Alkaline Phosphatase 38 (*)    Total Bilirubin 0.2 (*)    GFR calc non Af Amer 22 (*)    GFR calc Af Amer 25 (*)    All other components within normal limits  URINALYSIS, ROUTINE W REFLEX MICROSCOPIC - Abnormal; Notable for the following:    APPearance CLOUDY (*)    Bilirubin Urine SMALL (*)    Protein, ur 30 (*)    All other components within normal limits  PROTIME-INR - Abnormal; Notable for the following:    Prothrombin Time 32.5 (*)    INR 3.13 (*)    All other components within normal limits  BRAIN NATRIURETIC PEPTIDE - Abnormal; Notable for the following:    B Natriuretic Peptide 107.0 (*)    All other components within normal limits  URINE MICROSCOPIC-ADD ON - Abnormal; Notable for the following:    Bacteria, UA MANY (*)    Casts HYALINE CASTS (*)    Crystals CA OXALATE CRYSTALS (*)    All other components within normal limits  HEMOGLOBIN A1C - Abnormal; Notable for the following:    Hgb A1c MFr Bld 8.9 (*)    Mean Plasma Glucose 209 (*)    All other components within normal limits  GLUCOSE, CAPILLARY - Abnormal; Notable for the following:    Glucose-Capillary 110 (*)    All other components within normal limits  PROTIME-INR - Abnormal; Notable for the following:    Prothrombin Time 35.5 (*)    INR 3.51 (*)    All other components within normal limits  CBC WITH DIFFERENTIAL - Abnormal; Notable for the following:    RDW 16.1 (*)    All other components within normal limits  COMPREHENSIVE METABOLIC PANEL - Abnormal; Notable for the following:    Glucose, Bld 134 (*)  BUN 46 (*)    Creatinine, Ser 1.34 (*)    Alkaline Phosphatase 28 (*)    GFR calc non Af Amer 36 (*)    GFR calc Af Amer 42 (*)    All other components within normal limits  GLUCOSE, CAPILLARY - Abnormal; Notable for the following:    Glucose-Capillary 124 (*)    All  other components within normal limits  GLUCOSE, CAPILLARY - Abnormal; Notable for the following:    Glucose-Capillary 227 (*)    All other components within normal limits  GLUCOSE, CAPILLARY - Abnormal; Notable for the following:    Glucose-Capillary 206 (*)    All other components within normal limits  GLUCOSE, CAPILLARY - Abnormal; Notable for the following:    Glucose-Capillary 240 (*)    All other components within normal limits  CBG MONITORING, ED - Abnormal; Notable for the following:    Glucose-Capillary 173 (*)    All other components within normal limits  I-STAT VENOUS BLOOD GAS, ED - Abnormal; Notable for the following:    pH, Ven 7.324 (*)    pO2, Ven 46.0 (*)    Bicarbonate 24.6 (*)    All other components within normal limits  MRSA PCR SCREENING  TROPONIN I  TSH  TROPONIN I  TROPONIN I  TROPONIN I  PROTIME-INR  CBC  BASIC METABOLIC PANEL    Imaging Review Dg Chest 2 View  03/23/2014   CLINICAL DATA:  Unwitnessed fall in shower with left-sided chest pain, initial encounter  EXAM: CHEST  2 VIEW  COMPARISON:  None  FINDINGS: Cardiac shadow is within normal limits. The lungs are well aerated bilaterally. No pneumothorax is seen. The bony thorax is within normal limits. Mild degenerative changes of the thoracic spine are seen.  IMPRESSION: No acute abnormality noted.   Electronically Signed   By: Inez Catalina M.D.   On: 03/23/2014 18:48   Dg Thoracic Spine 2 View  03/23/2014   CLINICAL DATA:  Recent fall in shower with back  EXAM: THORACIC SPINE - 2 VIEW  COMPARISON:  None.  FINDINGS: Vertebral body height is well maintained. Mild degenerative changes are noted in the thoracic spine. No paraspinal mass lesion is noted. No rib abnormality is seen.  IMPRESSION: Degenerative change without acute abnormality.   Electronically Signed   By: Inez Catalina M.D.   On: 03/23/2014 18:52   Dg Lumbar Spine Complete  03/23/2014   CLINICAL DATA:  Fall in shower with low back pain,  initial encounter  EXAM: LUMBAR SPINE - COMPLETE 4+ VIEW  COMPARISON:  03/04/2014  FINDINGS: Five lumbar type vertebral bodies are well visualized. Disc space narrowing is noted at L2-3 with sclerosis and osteophytic changes. Mild retrolisthesis of L2 on L3 is seen. This is stable from the prior MRI examination. No acute compression deformity is noted. Diffuse aortic calcifications are seen. Postsurgical changes are noted.  IMPRESSION: Chronic changes without acute abnormality.   Electronically Signed   By: Inez Catalina M.D.   On: 03/23/2014 18:50   Dg Ankle Complete Left  03/23/2014   CLINICAL DATA:  Left ankle pain following fall in shower, initial encounter  EXAM: LEFT ANKLE COMPLETE - 3+ VIEW  COMPARISON:  None.  FINDINGS: Generalized soft tissue swelling No acute fracture or dislocation is seen. Is noted both medially and laterally  IMPRESSION: Soft tissue swelling without acute bony abnormality.   Electronically Signed   By: Inez Catalina M.D.   On: 03/23/2014 18:48   Dg  Ankle Complete Right  03/23/2014   CLINICAL DATA:  Fall in shower with right ankle pain, initial encounter  EXAM: RIGHT ANKLE - COMPLETE 3+ VIEW  COMPARISON:  None.  FINDINGS: Considerable soft tissue swelling is noted particularly laterally. No acute fracture or dislocation is noted. Small Achilles spur is noted.  IMPRESSION: Lateral soft tissue swelling without acute bony abnormality.   Electronically Signed   By: Inez Catalina M.D.   On: 03/23/2014 18:49   Ct Head Wo Contrast  03/23/2014   CLINICAL DATA:  Recent fall in shower, neck pain, initial encounter  EXAM: CT HEAD WITHOUT CONTRAST  CT CERVICAL SPINE WITHOUT CONTRAST  TECHNIQUE: Multidetector CT imaging of the head and cervical spine was performed following the standard protocol without intravenous contrast. Multiplanar CT image reconstructions of the cervical spine were also generated.  COMPARISON:  None.  FINDINGS: CT HEAD FINDINGS  The bony calvarium is intact. Atrophic  changes are seen. Mild chronic white matter ischemic change is noted. No findings to suggest acute hemorrhage, acute infarction or space-occupying mass lesion are seen.  CT CERVICAL SPINE FINDINGS  Seven cervical segments are well visualized. Vertebral body height is well maintained. Multilevel facet hypertrophic changes are seen. No acute fracture or acute facet abnormality is noted. The surrounding soft tissues to include the lung apices are within normal limits.  IMPRESSION: CT of the head: Chronic atrophic and ischemic changes without acute abnormality.  CT of the cervical spine: Multilevel degenerative change without acute abnormality.   Electronically Signed   By: Inez Catalina M.D.   On: 03/23/2014 19:01   Ct Cervical Spine Wo Contrast  03/23/2014   CLINICAL DATA:  Recent fall in shower, neck pain, initial encounter  EXAM: CT HEAD WITHOUT CONTRAST  CT CERVICAL SPINE WITHOUT CONTRAST  TECHNIQUE: Multidetector CT imaging of the head and cervical spine was performed following the standard protocol without intravenous contrast. Multiplanar CT image reconstructions of the cervical spine were also generated.  COMPARISON:  None.  FINDINGS: CT HEAD FINDINGS  The bony calvarium is intact. Atrophic changes are seen. Mild chronic white matter ischemic change is noted. No findings to suggest acute hemorrhage, acute infarction or space-occupying mass lesion are seen.  CT CERVICAL SPINE FINDINGS  Seven cervical segments are well visualized. Vertebral body height is well maintained. Multilevel facet hypertrophic changes are seen. No acute fracture or acute facet abnormality is noted. The surrounding soft tissues to include the lung apices are within normal limits.  IMPRESSION: CT of the head: Chronic atrophic and ischemic changes without acute abnormality.  CT of the cervical spine: Multilevel degenerative change without acute abnormality.   Electronically Signed   By: Inez Catalina M.D.   On: 03/23/2014 19:01   Dg  Shoulder Left  03/23/2014   CLINICAL DATA:  Left shoulder pain following fall in shower, initial encounter  EXAM: LEFT SHOULDER - 2+ VIEW  COMPARISON:  10/28/2013  FINDINGS: Degenerative changes of the acromioclavicular joint are noted. Some mild changes in the humeral head are noted which may be related to prior dislocation or rotator cuff degenerative change. No acute fracture or dislocation is noted.  IMPRESSION: Chronic changes without acute abnormality.   Electronically Signed   By: Inez Catalina M.D.   On: 03/23/2014 18:51     EKG Interpretation   Date/Time:  Thursday March 23 2014 16:37:27 EST Ventricular Rate:  43 PR Interval:  205 QRS Duration: 96 QT Interval:  475 QTC Calculation: 402 R Axis:   81  Text Interpretation:  Sinus bradycardia Borderline right axis deviation  Probable anteroseptal infarct, old HEART RATE DECREASED SINCE last tracing  Confirmed by Debby Freiberg [phone]) on 03/23/2014 5:48:53 PM      MDM   Final diagnoses:  Fall    80 y.o. female with pertinent PMH of CHF, afib, COPD, DM, early alzheimer, CKD presents with unexplained fall today in the shower.  She refused ccollar by EMS.  She had antecedent chest pain.  On arrival pt with vitals and physical exam as above.  No focal neuro deficits, pt has tenderness in above areas.  Unclear if the pt had syncope or antecedent symptoms due to underlying dementia.  WU as above unremarkable for traumatic injury.  Given possibility of syncope and chf vs symptomatic bradycardia, admitted in stable condition.  I have reviewed all laboratory and imaging studies if ordered as above  1. Fall   2. Symptomatic bradycardia     Debby Freiberg, MD 03/24/14 [phone]

## 2014-03-23 NOTE — ED Notes (Signed)
Pts son adamant that the pt does not have COPD. Pts son adamant that it was thought that the pt had COPD but further testing found that the pt in fact did not have COPD.

## 2014-03-23 NOTE — ED Notes (Signed)
MD at bedside. 

## 2014-03-23 NOTE — ED Notes (Signed)
Patient transported to X-ray 

## 2014-03-23 NOTE — ED Notes (Signed)
CBG - 173 

## 2014-03-23 NOTE — ED Notes (Signed)
Dr. Patel at bedside 

## 2014-03-23 NOTE — ED Notes (Signed)
pts son at bedside. Reports that he found her bottle of nitro in pts room. Pt stated that she took 2 nitro, pts son reports that the nitro was taken before 1:30 pm and that he believes that the pt started taking her shower around 2:55 pm based on the time he got the alert that she had used her life alert pendant.  Pt reported that she was having chest "pricks" about an hour before the shower.  The pts son reports that the pt has had an increase in weight of about 5 lbs and has recently had an increase in her lasix dosage.

## 2014-03-23 NOTE — H&P (Signed)
[facility] History and Physical  Patient: Kara Ortega  [id number]  DOB: 1930/03/25  DOS: the patient was seen and examined on 03/23/2014 PCP: [name], MD  Chief Complaint: Fall  HPI: Kara Ortega is a 80 y.o. female with Past medical history of chronic doesn't dysfunction, sleep apnea with sleep, COPD, A. fib, chronic kidney disease, dementia, diabetes. Patient presented with a fall. The fall has been described by the patient denies she was taking shower and while she was coming out of the shower she slipped on the ground and fell. She has been having dizziness throughout the day and denies any other focal deficits. She denies any fever or chills. The family has noticed that she has been gaining weight and her Lasix was increased from 20 mg to 40 mg for few days and since last 2 days she is back on 20 mg daily dose. No other recent change in her medications reported. Patient did complain of left shoulder pain which has been present for last 4 weeks. Patient also complained of some on and off chest pain which is present but at the time of my evaluation absent. She has been eating and drinking okay. No nausea no vomiting no diarrhea reported. No burning urination. No active bleeding. She denies any taking any extra medications. Her medications are done by her son placed in a pillbox.  The patient is coming from home. And at her baseline independent for most of her ADL.  Review of Systems: as mentioned in the history of present illness.  A Comprehensive review of the other systems is negative.  Past Medical History  Diagnosis Date  . Abnormal heart rhythms   . Diabetes     insulin dependent  . Sleep apnea     uses bipap X12 years.  Marland Kitchen COPD (chronic obstructive pulmonary disease)   . CHF (congestive heart failure)   . A-fib   . Anginal pain   . Hypertension   . Stroke     LEFT SIDE RESIDUAL  . Shortness of breath   . Depression   . Chronic kidney  disease     STAGE 3   . Memory loss   . Alzheimer disease   . Leg pain   . Back pain   . Neuropathy    Past Surgical History  Procedure Laterality Date  . Breast surgery      X4  . Abdominal surgery      removed 14 inches of colon  . Hernia repair    . Breast lumpectomy     Social History:  reports that she quit smoking about 46 years ago. Her smoking use included Cigarettes. She has a 27 pack-year smoking history. She has never used smokeless tobacco. She reports that she does not drink alcohol or use illicit drugs.  Allergies  Allergen Reactions  . Avandia [Rosiglitazone] Other (See Comments)    edema  . Benazepril Other (See Comments)    unknown  . Dilaudid [Hydromorphone Hcl]     "does not tolerate strong pain medications" per pt  . Fosamax [Alendronate Sodium] Nausea And Vomiting  . Talwin [Pentazocine] Nausea And Vomiting  . Tetanus Toxoids     "allergic to something in the tetanus injection" per patient  . Zyrtec [Cetirizine] Other (See Comments)    unknown    Family History  Problem Relation Age of Onset  . Heart disease Mother   . Heart disease Sister   . Dementia Sister     Prior to  Admission medications   Medication Sig Start Date End Date Taking? Authorizing Provider  albuterol (PROVENTIL HFA;VENTOLIN HFA) 108 (90 BASE) MCG/ACT inhaler Inhale 1-2 puffs into the lungs every 4 (four) hours as needed for wheezing or shortness of breath. 03/25/13  Yes Kathee Delton, MD  albuterol (PROVENTIL) (2.5 MG/3ML) 0.083% nebulizer solution Take 2.5 mg by nebulization every 6 (six) hours as needed for wheezing or shortness of breath.   Yes Historical Provider, MD  ALPRAZolam (XANAX) 0.25 MG tablet Take 0.25 mg by mouth 2 (two) times daily as needed for anxiety.   Yes Historical Provider, MD  atorvastatin (LIPITOR) 10 MG tablet Take 10 mg by mouth at bedtime.    Yes Historical Provider, MD  b complex vitamins tablet Take 1 tablet by mouth daily.   Yes Historical Provider,  MD  B Complex-C (B-COMPLEX WITH VITAMIN C) tablet Take 1 tablet by mouth daily.   Yes Historical Provider, MD  calcium carbonate (OS-CAL - DOSED IN MG OF ELEMENTAL CALCIUM) 1250 MG tablet Take 1 tablet by mouth daily with breakfast.   Yes Historical Provider, MD  celecoxib (CELEBREX) 200 MG capsule Take 200 mg by mouth daily.   Yes Historical Provider, MD  Cholecalciferol (HM VITAMIN D3) 4000 UNITS CAPS Take 12,000 Units by mouth daily.    Yes Historical Provider, MD  citalopram (CELEXA) 20 MG tablet Take 20 mg by mouth daily.   Yes Historical Provider, MD  colchicine 0.6 MG tablet Take 0.6 mg by mouth daily as needed (gout).   Yes Historical Provider, MD  colesevelam (WELCHOL) 625 MG tablet Take 1,875 mg by mouth 2 (two) times daily with a meal.   Yes Historical Provider, MD  diclofenac sodium (VOLTAREN) 1 % GEL Apply 4 g topically 4 (four) times daily as needed (pain).    Yes Historical Provider, MD  diltiazem (TIAZAC) 240 MG 24 hr capsule Take 240 mg by mouth daily.   Yes Historical Provider, MD  febuxostat (ULORIC) 40 MG tablet Take 40 mg by mouth daily.   Yes Historical Provider, MD  fenofibrate micronized (LOFIBRA) 134 MG capsule Take 134 mg by mouth daily before breakfast.   Yes Historical Provider, MD  furosemide (LASIX) 40 MG tablet Take 20 mg by mouth daily.    Yes Historical Provider, MD  Insulin Detemir (LEVEMIR FLEXTOUCH) 100 UNIT/ML Pen Inject 64 Units into the skin at bedtime.    Yes Historical Provider, MD  isosorbide mononitrate (IMDUR) 30 MG 24 hr tablet Take 30 mg by mouth every 12 (twelve) hours.   Yes Historical Provider, MD  Liraglutide (VICTOZA) 18 MG/3ML SOPN Inject 1.2 mg into the skin every morning.   Yes Historical Provider, MD  losartan (COZAAR) 25 MG tablet Take 25 mg by mouth daily.   Yes Historical Provider, MD  metoprolol succinate (TOPROL-XL) 25 MG 24 hr tablet Take 25 mg by mouth daily.   Yes Historical Provider, MD  Multiple Vitamin (MULTIVITAMIN WITH MINERALS)  TABS tablet Take 1 tablet by mouth daily.   Yes Historical Provider, MD  nitroGLYCERIN (NITROSTAT) 0.4 MG SL tablet Place 0.4 mg under the tongue every 5 (five) minutes as needed for chest pain.    Yes Historical Provider, MD  pantoprazole (PROTONIX) 40 MG tablet Take 40 mg by mouth daily.   Yes Historical Provider, MD  potassium chloride (KLOR-CON) 20 MEQ packet Take 40 mEq by mouth daily.    Yes Historical Provider, MD  pregabalin (LYRICA) 75 MG capsule Take 75 mg by mouth 2 (  two) times daily.   Yes Historical Provider, MD  rivastigmine (EXELON) 9.5 mg/24hr Place 9.5 mg onto the skin daily.   Yes Historical Provider, MD  spironolactone (ALDACTONE) 25 MG tablet Take 25 mg by mouth daily.   Yes Historical Provider, MD  warfarin (COUMADIN) 5 MG tablet Take 2.5-5 mg by mouth See admin instructions. 5 mg every day except on Tuesday, Thursday, Saturday patient takes 2.5 mg   Yes Historical Provider, MD  zolpidem (AMBIEN) 5 MG tablet Take 5 mg by mouth at bedtime as needed for sleep.   Yes Historical Provider, MD  allopurinol (ZYLOPRIM) 100 MG tablet Take 100 mg by mouth daily.    Historical Provider, MD  aspirin 81 MG chewable tablet Chew 81 mg by mouth daily.    Historical Provider, MD  esomeprazole (NEXIUM) 40 MG capsule Take 40 mg by mouth daily.     Historical Provider, MD  HYDROcodone-acetaminophen (NORCO/VICODIN) 5-325 MG per tablet Take 0.5 tablets by mouth every 6 (six) hours as needed for moderate pain.    Historical Provider, MD    Physical Exam: Filed Vitals:   03/23/14 1930 03/23/14 1945 03/23/14 2000 03/23/14 2015  BP: 99/45 106/50 107/48 98/50  Pulse: 54 57 51 51  Temp:      TempSrc:      Resp: 18 22 15 17   Height:      Weight:      SpO2: 92% 96% 96% 91%    General: Alert, Awake and Oriented to Time, Place and Person. Appear in mild distress Eyes: PERRL ENT: Oral Mucosa clear moist. Neck: no JVD Cardiovascular: S1 and S2 Present, aortic systolic Murmur, Peripheral Pulses  Present Respiratory: Bilateral Air entry equal and Decreased, Clear to Auscultation, noCrackles, no wheezes Abdomen: Bowel Sound present, Soft and non tender Skin: no Rash Extremities: no Pedal edema, no calf tenderness Neurologic: Grossly no focal neuro deficit.  Labs on Admission:  CBC:  Recent Labs Lab 03/23/14 1705  WBC 9.2  NEUTROABS 5.9  HGB 13.7  HCT 41.6  MCV 86.7  PLT 201    CMP     Component Value Date/Time   NA 135 03/23/2014 1705   K 4.5 03/23/2014 1705   CL 100 03/23/2014 1705   CO2 24 03/23/2014 1705   GLUCOSE 187* 03/23/2014 1705   BUN 54* 03/23/2014 1705   CREATININE 2.02* 03/23/2014 1705   CALCIUM 10.8* 03/23/2014 1705   PROT 6.5 03/23/2014 1705   ALBUMIN 4.1 03/23/2014 1705   AST 39* 03/23/2014 1705   ALT 29 03/23/2014 1705   ALKPHOS 38* 03/23/2014 1705   BILITOT 0.2* 03/23/2014 1705   GFRNONAA 22* 03/23/2014 1705   GFRAA 25* 03/23/2014 1705    No results for input(s): LIPASE, AMYLASE in the last 168 hours. No results for input(s): AMMONIA in the last 168 hours.   Recent Labs Lab 03/23/14 1705  TROPONINI <0.03   BNP (last 3 results)  Recent Labs  09/13/13 2350  PROBNP 115.2    Radiological Exams on Admission: Dg Chest 2 View  03/23/2014   CLINICAL DATA:  Unwitnessed fall in shower with left-sided chest pain, initial encounter  EXAM: CHEST  2 VIEW  COMPARISON:  None  FINDINGS: Cardiac shadow is within normal limits. The lungs are well aerated bilaterally. No pneumothorax is seen. The bony thorax is within normal limits. Mild degenerative changes of the thoracic spine are seen.  IMPRESSION: No acute abnormality noted.   Electronically Signed   By: Linus Mako.D.  On: 03/23/2014 18:48   Dg Thoracic Spine 2 View  03/23/2014   CLINICAL DATA:  Recent fall in shower with back  EXAM: THORACIC SPINE - 2 VIEW  COMPARISON:  None.  FINDINGS: Vertebral body height is well maintained. Mild degenerative changes are noted in the thoracic spine.  No paraspinal mass lesion is noted. No rib abnormality is seen.  IMPRESSION: Degenerative change without acute abnormality.   Electronically Signed   By: Inez Catalina M.D.   On: 03/23/2014 18:52   Dg Lumbar Spine Complete  03/23/2014   CLINICAL DATA:  Fall in shower with low back pain, initial encounter  EXAM: LUMBAR SPINE - COMPLETE 4+ VIEW  COMPARISON:  03/04/2014  FINDINGS: Five lumbar type vertebral bodies are well visualized. Disc space narrowing is noted at L2-3 with sclerosis and osteophytic changes. Mild retrolisthesis of L2 on L3 is seen. This is stable from the prior MRI examination. No acute compression deformity is noted. Diffuse aortic calcifications are seen. Postsurgical changes are noted.  IMPRESSION: Chronic changes without acute abnormality.   Electronically Signed   By: Inez Catalina M.D.   On: 03/23/2014 18:50   Dg Ankle Complete Left  03/23/2014   CLINICAL DATA:  Left ankle pain following fall in shower, initial encounter  EXAM: LEFT ANKLE COMPLETE - 3+ VIEW  COMPARISON:  None.  FINDINGS: Generalized soft tissue swelling No acute fracture or dislocation is seen. Is noted both medially and laterally  IMPRESSION: Soft tissue swelling without acute bony abnormality.   Electronically Signed   By: Inez Catalina M.D.   On: 03/23/2014 18:48   Dg Ankle Complete Right  03/23/2014   CLINICAL DATA:  Fall in shower with right ankle pain, initial encounter  EXAM: RIGHT ANKLE - COMPLETE 3+ VIEW  COMPARISON:  None.  FINDINGS: Considerable soft tissue swelling is noted particularly laterally. No acute fracture or dislocation is noted. Small Achilles spur is noted.  IMPRESSION: Lateral soft tissue swelling without acute bony abnormality.   Electronically Signed   By: Inez Catalina M.D.   On: 03/23/2014 18:49   Ct Head Wo Contrast  03/23/2014   CLINICAL DATA:  Recent fall in shower, neck pain, initial encounter  EXAM: CT HEAD WITHOUT CONTRAST  CT CERVICAL SPINE WITHOUT CONTRAST  TECHNIQUE:  Multidetector CT imaging of the head and cervical spine was performed following the standard protocol without intravenous contrast. Multiplanar CT image reconstructions of the cervical spine were also generated.  COMPARISON:  None.  FINDINGS: CT HEAD FINDINGS  The bony calvarium is intact. Atrophic changes are seen. Mild chronic white matter ischemic change is noted. No findings to suggest acute hemorrhage, acute infarction or space-occupying mass lesion are seen.  CT CERVICAL SPINE FINDINGS  Seven cervical segments are well visualized. Vertebral body height is well maintained. Multilevel facet hypertrophic changes are seen. No acute fracture or acute facet abnormality is noted. The surrounding soft tissues to include the lung apices are within normal limits.  IMPRESSION: CT of the head: Chronic atrophic and ischemic changes without acute abnormality.  CT of the cervical spine: Multilevel degenerative change without acute abnormality.   Electronically Signed   By: Inez Catalina M.D.   On: 03/23/2014 19:01   Ct Cervical Spine Wo Contrast  03/23/2014   CLINICAL DATA:  Recent fall in shower, neck pain, initial encounter  EXAM: CT HEAD WITHOUT CONTRAST  CT CERVICAL SPINE WITHOUT CONTRAST  TECHNIQUE: Multidetector CT imaging of the head and cervical spine was performed following the standard  protocol without intravenous contrast. Multiplanar CT image reconstructions of the cervical spine were also generated.  COMPARISON:  None.  FINDINGS: CT HEAD FINDINGS  The bony calvarium is intact. Atrophic changes are seen. Mild chronic white matter ischemic change is noted. No findings to suggest acute hemorrhage, acute infarction or space-occupying mass lesion are seen.  CT CERVICAL SPINE FINDINGS  Seven cervical segments are well visualized. Vertebral body height is well maintained. Multilevel facet hypertrophic changes are seen. No acute fracture or acute facet abnormality is noted. The surrounding soft tissues to include the  lung apices are within normal limits.  IMPRESSION: CT of the head: Chronic atrophic and ischemic changes without acute abnormality.  CT of the cervical spine: Multilevel degenerative change without acute abnormality.   Electronically Signed   By: Inez Catalina M.D.   On: 03/23/2014 19:01   Dg Shoulder Left  03/23/2014   CLINICAL DATA:  Left shoulder pain following fall in shower, initial encounter  EXAM: LEFT SHOULDER - 2+ VIEW  COMPARISON:  10/28/2013  FINDINGS: Degenerative changes of the acromioclavicular joint are noted. Some mild changes in the humeral head are noted which may be related to prior dislocation or rotator cuff degenerative change. No acute fracture or dislocation is noted.  IMPRESSION: Chronic changes without acute abnormality.   Electronically Signed   By: Inez Catalina M.D.   On: 03/23/2014 18:51    EKG: Independently reviewed. sinus bradycardia.  Assessment/Plan Principal Problem:   AKI (acute kidney injury) Active Problems:   A-fib   Diabetes   Alzheimer's dementia   Accident due to mechanical fall without injury   Diastolic dysfunction with chronic heart failure   Sinus bradycardia   Hypotension   OSA on CPAP   1. AKI (acute kidney injury) The patient is presenting with a mechanical fall. On further workup she is found to have acute kidney injury with increase in serum creatinine from her baseline. Currently she is hypotensive and bradycardic and appears euvolemic. Her BNP is elevated but recently they have increased her Lasix dose. Her weight is up. With this currently I will hold all other nephrotoxic medications. Monitor her I's and O's. Check echocardiogram in the morning. If her weight is trending upwards as well as serum BMP going upwards then we will treat her with IV Lasix with suspicion for cardiorenal hemodynamics. If her BMP improves with holding Lasix and will continue holding Lasix.  2. Hypotension, sinus bradycardia, fall, dizziness. Patient  complains of dizziness. A workup including CT scan and imaging does not show any acute abnormality. EKG shows sinus bradycardia blood pressure is mildly lower. Possibility of polypharmacy cannot be ruled out. Therefore I would hold her Toprol as well as a diltiazem. Monitor in stepdown unit overnight. adjust medication according to heart rate.  3. Diabetes mellitus. Continuing home Levemir and placing her on sliding scale.  4. Dementia. Without Behavior disturbances continue close monitoring and continue home medication.  5. Obstructive sleep apnea Continue CPAP  Advance goals of care discussion: Full code as per my discussion with patient and her son. Her son will be her power of attorney.   DVT Prophylaxis: subcutaneous Heparin Nutrition: npo  Family Communication: son was present at bedside, opportunity was given to ask question and all questions were answered satisfactorily at the time of interview. Disposition: Admitted to inpatient in step-down unit.  Author: Berle Mull, MD [facility] Pager: [phone] 03/23/2014, 9:13 PM    If 7PM-7AM, please contact night-coverage [email or web address] Password [email or web address]

## 2014-03-23 NOTE — ED Notes (Signed)
Per pts son, the pt is believed to have been laying on the floor after her fall for about 30 to 40 minutes.

## 2014-03-23 NOTE — Progress Notes (Signed)
ANTICOAGULATION CONSULT NOTE - Initial Consult  Pharmacy Consult for coumadin Indication: atrial fibrillation  Allergies  Allergen Reactions  . Avandia [Rosiglitazone] Other (See Comments)    edema  . Benazepril Other (See Comments)    unknown  . Dilaudid [Hydromorphone Hcl]     "does not tolerate strong pain medications" per pt  . Fosamax [Alendronate Sodium] Nausea And Vomiting  . Talwin [Pentazocine] Nausea And Vomiting  . Tetanus Toxoids     "allergic to something in the tetanus injection" per patient  . Zyrtec [Cetirizine] Other (See Comments)    unknown    Patient Measurements: Height: 5\' 1"  (154.9 cm) Weight: 229 lb 15 oz (104.3 kg) IBW/kg (Calculated) : 47.8  Vital Signs: Temp: 97.8 F (36.6 C) (12/24 2137) Temp Source: Oral (12/24 2137) BP: 99/58 mmHg (12/24 2137) Pulse Rate: 54 (12/24 2137)  Labs:  Recent Labs  03/23/14 1705  HGB 13.7  HCT 41.6  PLT 201  LABPROT 32.5*  INR 3.13*  CREATININE 2.02*  TROPONINI <0.03    Estimated Creatinine Clearance: 23.9 mL/min (by C-G formula based on Cr of 2.02).   Medical History: Past Medical History  Diagnosis Date  . Abnormal heart rhythms   . Diabetes     insulin dependent  . Sleep apnea     uses bipap X12 years.  Marland Kitchen COPD (chronic obstructive pulmonary disease)   . CHF (congestive heart failure)   . A-fib   . Anginal pain   . Hypertension   . Stroke     LEFT SIDE RESIDUAL  . Shortness of breath   . Depression   . Chronic kidney disease     STAGE 3   . Memory loss   . Alzheimer disease   . Leg pain   . Back pain   . Neuropathy     Medications:  Prescriptions prior to admission  Medication Sig Dispense Refill Last Dose  . albuterol (PROVENTIL HFA;VENTOLIN HFA) 108 (90 BASE) MCG/ACT inhaler Inhale 1-2 puffs into the lungs every 4 (four) hours as needed for wheezing or shortness of breath. 1 Inhaler 11 Past Month at Unknown time  . albuterol (PROVENTIL) (2.5 MG/3ML) 0.083% nebulizer solution  Take 2.5 mg by nebulization every 6 (six) hours as needed for wheezing or shortness of breath.   Past Month at Unknown time  . ALPRAZolam (XANAX) 0.25 MG tablet Take 0.25 mg by mouth 2 (two) times daily as needed for anxiety.   03/23/2014 at Unknown time  . atorvastatin (LIPITOR) 10 MG tablet Take 10 mg by mouth at bedtime.    03/22/2014 at Unknown time  . b complex vitamins tablet Take 1 tablet by mouth daily.   03/23/2014 at Unknown time  . B Complex-C (B-COMPLEX WITH VITAMIN C) tablet Take 1 tablet by mouth daily.   03/22/2014 at Unknown time  . calcium carbonate (OS-CAL - DOSED IN MG OF ELEMENTAL CALCIUM) 1250 MG tablet Take 1 tablet by mouth daily with breakfast.   03/22/2014 at Unknown time  . celecoxib (CELEBREX) 200 MG capsule Take 200 mg by mouth daily.   03/22/2014 at Unknown time  . Cholecalciferol (HM VITAMIN D3) 4000 UNITS CAPS Take 12,000 Units by mouth daily.    03/22/2014 at Unknown time  . citalopram (CELEXA) 20 MG tablet Take 20 mg by mouth daily.   03/23/2014 at Unknown time  . colchicine 0.6 MG tablet Take 0.6 mg by mouth daily as needed (gout).   03/22/2014 at Unknown time  . colesevelam [facility]) 625  MG tablet Take 1,875 mg by mouth 2 (two) times daily with a meal.   03/23/2014 at Unknown time  . diclofenac sodium (VOLTAREN) 1 % GEL Apply 4 g topically 4 (four) times daily as needed (pain).    03/23/2014 at Unknown time  . diltiazem (TIAZAC) 240 MG 24 hr capsule Take 240 mg by mouth daily.   03/22/2014 at Unknown time  . febuxostat (ULORIC) 40 MG tablet Take 40 mg by mouth daily.   03/22/2014 at Unknown time  . fenofibrate micronized (LOFIBRA) 134 MG capsule Take 134 mg by mouth daily before breakfast.   03/22/2014 at Unknown time  . furosemide (LASIX) 40 MG tablet Take 20 mg by mouth daily.    03/22/2014 at Unknown time  . Insulin Detemir (LEVEMIR FLEXTOUCH) 100 UNIT/ML Pen Inject 64 Units into the skin at bedtime.    03/22/2014 at Unknown time  . isosorbide mononitrate  (IMDUR) 30 MG 24 hr tablet Take 30 mg by mouth every 12 (twelve) hours.   03/23/2014 at Unknown time  . Liraglutide (VICTOZA) 18 MG/3ML SOPN Inject 1.2 mg into the skin every morning.     Marland Kitchen losartan (COZAAR) 25 MG tablet Take 25 mg by mouth daily.     . metoprolol succinate (TOPROL-XL) 25 MG 24 hr tablet Take 25 mg by mouth daily.   03/23/2014 at [place]  . Multiple Vitamin (MULTIVITAMIN WITH MINERALS) TABS tablet Take 1 tablet by mouth daily.   03/23/2014 at Unknown time  . nitroGLYCERIN (NITROSTAT) 0.4 MG SL tablet Place 0.4 mg under the tongue every 5 (five) minutes as needed for chest pain.    03/23/2014 at Unknown time  . pantoprazole (PROTONIX) 40 MG tablet Take 40 mg by mouth daily.   03/23/2014 at Unknown time  . potassium chloride (KLOR-CON) 20 MEQ packet Take 40 mEq by mouth daily.    03/23/2014 at Unknown time  . pregabalin (LYRICA) 75 MG capsule Take 75 mg by mouth 2 (two) times daily.     . rivastigmine (EXELON) 9.5 mg/24hr Place 9.5 mg onto the skin daily.   03/23/2014 at Unknown time  . spironolactone (ALDACTONE) 25 MG tablet Take 25 mg by mouth daily.   03/22/2014 at Unknown time  . warfarin (COUMADIN) 5 MG tablet Take 2.5-5 mg by mouth See admin instructions. 5 mg every day except on Tuesday, Thursday, Saturday patient takes 2.5 mg   03/22/2014 at Unknown time  . zolpidem (AMBIEN) 5 MG tablet Take 5 mg by mouth at bedtime as needed for sleep.   03/22/2014 at Unknown time  . allopurinol (ZYLOPRIM) 100 MG tablet Take 100 mg by mouth daily.   Not Taking at Unknown time  . aspirin 81 MG chewable tablet Chew 81 mg by mouth daily.   Not Taking at Unknown time  . esomeprazole (NEXIUM) 40 MG capsule Take 40 mg by mouth daily.    Not Taking at Unknown time  . HYDROcodone-acetaminophen (NORCO/VICODIN) 5-325 MG per tablet Take 0.5 tablets by mouth every 6 (six) hours as needed for moderate pain.   Taking    Assessment: 80 yo lady to continue coumadin for afib.  Her admit INR is 3.13. Goal of  Therapy:  INR 2-3 Monitor platelets by anticoagulation protocol: Yes   Plan:  Will check PT/INR in am and daily  Erienne Spelman Poteet 03/23/2014,10:20 PM

## 2014-03-23 NOTE — ED Notes (Signed)
Per pts son, the pt is weighed every day. Pt is usally 221 lbs, and has been gaining about 1 lb per day.

## 2014-03-24 DIAGNOSIS — I359 Nonrheumatic aortic valve disorder, unspecified: Secondary | ICD-10-CM

## 2014-03-24 DIAGNOSIS — G4733 Obstructive sleep apnea (adult) (pediatric): Secondary | ICD-10-CM | POA: Diagnosis present

## 2014-03-24 DIAGNOSIS — I959 Hypotension, unspecified: Secondary | ICD-10-CM | POA: Diagnosis present

## 2014-03-24 DIAGNOSIS — I5032 Chronic diastolic (congestive) heart failure: Secondary | ICD-10-CM | POA: Diagnosis present

## 2014-03-24 DIAGNOSIS — I050 Rheumatic mitral stenosis: Secondary | ICD-10-CM

## 2014-03-24 DIAGNOSIS — R0600 Dyspnea, unspecified: Secondary | ICD-10-CM

## 2014-03-24 DIAGNOSIS — W19XXXA Unspecified fall, initial encounter: Secondary | ICD-10-CM | POA: Diagnosis present

## 2014-03-24 DIAGNOSIS — R296 Repeated falls: Secondary | ICD-10-CM

## 2014-03-24 DIAGNOSIS — R0609 Other forms of dyspnea: Secondary | ICD-10-CM

## 2014-03-24 DIAGNOSIS — R001 Bradycardia, unspecified: Secondary | ICD-10-CM | POA: Diagnosis present

## 2014-03-24 DIAGNOSIS — R0602 Shortness of breath: Secondary | ICD-10-CM

## 2014-03-24 DIAGNOSIS — I350 Nonrheumatic aortic (valve) stenosis: Secondary | ICD-10-CM

## 2014-03-24 DIAGNOSIS — Z7901 Long term (current) use of anticoagulants: Secondary | ICD-10-CM

## 2014-03-24 LAB — COMPREHENSIVE METABOLIC PANEL WITH GFR
ALT: 25 U/L (ref 0–35)
AST: 32 U/L (ref 0–37)
Albumin: 3.7 g/dL (ref 3.5–5.2)
Alkaline Phosphatase: 28 U/L — ABNORMAL LOW (ref 39–117)
Anion gap: 11 (ref 5–15)
BUN: 46 mg/dL — ABNORMAL HIGH (ref 6–23)
CO2: 25 mmol/L (ref 19–32)
Calcium: 9.8 mg/dL (ref 8.4–10.5)
Chloride: 103 meq/L (ref 96–112)
Creatinine, Ser: 1.34 mg/dL — ABNORMAL HIGH (ref 0.50–1.10)
GFR calc Af Amer: 42 mL/min — ABNORMAL LOW
GFR calc non Af Amer: 36 mL/min — ABNORMAL LOW
Glucose, Bld: 134 mg/dL — ABNORMAL HIGH (ref 70–99)
Potassium: 3.9 mmol/L (ref 3.5–5.1)
Sodium: 139 mmol/L (ref 135–145)
Total Bilirubin: 0.4 mg/dL (ref 0.3–1.2)
Total Protein: 6.2 g/dL (ref 6.0–8.3)

## 2014-03-24 LAB — TROPONIN I
Troponin I: <0.03 ng/mL (ref <0.031)
Troponin I: <0.03 ng/mL (ref <0.031)
Troponin I: <0.03 ng/mL (ref <0.031)

## 2014-03-24 LAB — PROTIME-INR
INR: 3.51 — ABNORMAL HIGH (ref 0.00–1.49)
PROTHROMBIN TIME: 35.5 s — AB (ref 11.6–15.2)

## 2014-03-24 LAB — CBC WITH DIFFERENTIAL/PLATELET
Basophils Absolute: 0 K/uL (ref 0.0–0.1)
Basophils Relative: 0 % (ref 0–1)
Eosinophils Absolute: 0.2 K/uL (ref 0.0–0.7)
Eosinophils Relative: 2 % (ref 0–5)
HCT: 40.4 % (ref 36.0–46.0)
Hemoglobin: 13 g/dL (ref 12.0–15.0)
Lymphocytes Relative: 41 % (ref 12–46)
Lymphs Abs: 3.2 K/uL (ref 0.7–4.0)
MCH: 28.1 pg (ref 26.0–34.0)
MCHC: 32.2 g/dL (ref 30.0–36.0)
MCV: 87.3 fL (ref 78.0–100.0)
Monocytes Absolute: 0.6 K/uL (ref 0.1–1.0)
Monocytes Relative: 7 % (ref 3–12)
Neutro Abs: 4 K/uL (ref 1.7–7.7)
Neutrophils Relative %: 50 % (ref 43–77)
Platelets: 162 K/uL (ref 150–400)
RBC: 4.63 MIL/uL (ref 3.87–5.11)
RDW: 16.1 % — ABNORMAL HIGH (ref 11.5–15.5)
WBC: 8 K/uL (ref 4.0–10.5)

## 2014-03-24 LAB — TSH: TSH: 2.129 u[IU]/mL (ref 0.350–4.500)

## 2014-03-24 LAB — GLUCOSE, CAPILLARY
GLUCOSE-CAPILLARY: 124 mg/dL — AB (ref 70–99)
Glucose-Capillary: 227 mg/dL — ABNORMAL HIGH (ref 70–99)
Glucose-Capillary: 206 mg/dL — ABNORMAL HIGH (ref 70–99)
Glucose-Capillary: 240 mg/dL — ABNORMAL HIGH (ref 70–99)

## 2014-03-24 LAB — HEMOGLOBIN A1C
Hgb A1c MFr Bld: 8.9 % — ABNORMAL HIGH (ref <5.7)
Mean Plasma Glucose: 209 mg/dL — ABNORMAL HIGH (ref <117)

## 2014-03-24 NOTE — Progress Notes (Signed)
Echocardiogram 2D Echocardiogram has been performed.  Joelene Millin 03/24/2014, 8:40 AM

## 2014-03-24 NOTE — Progress Notes (Signed)
Dr. Posey Pronto up to see pt. Made aware of labile B/Ps and HR's 50-58. No new orders at this time.  Will continue to monitor closely.

## 2014-03-24 NOTE — Progress Notes (Signed)
Jyl Heinz NP made aware of HR 50-55 B/P 87/42. Pt due for Imdur 30mg  PO. Instructed to hold present dose and obtain a manual B/P.

## 2014-03-24 NOTE — Progress Notes (Signed)
Spoke with pt regarding cpap use at night.  Pt stated she wears bipap, not cpap at night.  Pt wants to wear her bipap brought in from home , stating she does not want to be charged for using hospital equipment.  RT made RN aware and requested md notified for rx change.

## 2014-03-24 NOTE — Consult Note (Signed)
CARDIOLOGY CONSULT NOTE   Patient ID: Kara Ortega MRN: [id number] DOB/AGE: December 13, 1929 80 y.o.  Admit Date: 03/23/2014  Primary Physician: Kara Naas, MD  Primary Cardiologist     [place] Reason for Consultation:   Clinical Summary Kara Ortega is a 80 y.o.female. She is admitted with weakness and renal dysfunction and dehydration. She's had falls in the past. She's been seen by neurology in September, 2015. Neurology felt that her falling was probably multifactorial including diabetic neuropathy and possible weakness from her prior stroke. It was also felt that she did have some Alzheimer's.  The patient is also been bothered by shortness of breath. She's had a very complete workup by Dr. Gwenette Ortega of the pulmonary team. He found her pulmonary functions to be quite good. He felt that there was no primary pulmonary basis for her shortness of breath. He suggested further consideration by primary care for her weight and deconditioning and also cardiology. She was scheduled to be seen at some point by Dr. Marlou Ortega of cardiology.  Currently in the hospital she had a 2-D echo. The study reveals normal systolic left ventricular function with ejection fraction greater than 60%. There is moderate aortic stenosis. There is question of the possibility of some mild functional mitral stenosis. Cardiology is consulted to see if cardiology issues are playing a significant role in her overall shortness of breath and falling episodes. In addition on admission there was some bradycardia. Her meds a bit reduced and her heart rate is now 60. She is in sinus rhythm. There is history of paroxysmal atrial fibrillation and the patient is on Coumadin.   Allergies  Allergen Reactions  . Avandia [Rosiglitazone] Other (See Comments)    edema  . Benazepril Other (See Comments)    unknown  . Dilaudid [Hydromorphone Hcl]     "does not tolerate strong pain medications" per pt  . Fosamax [Alendronate  Sodium] Nausea And Vomiting  . Talwin [Pentazocine] Nausea And Vomiting  . Tetanus Toxoids     "allergic to something in the tetanus injection" per patient  . Zyrtec [Cetirizine] Other (See Comments)    unknown    Medications Scheduled Medications: . antiseptic oral rinse  7 mL Mouth Rinse BID  . atorvastatin  10 mg Oral QHS  . citalopram  20 mg Oral Daily  . colesevelam  1,875 mg Oral BID WC  . febuxostat  40 mg Oral Daily  . insulin aspart  0-5 Units Subcutaneous QHS  . insulin aspart  0-9 Units Subcutaneous TID WC  . insulin detemir  64 Units Subcutaneous QHS  . pantoprazole  40 mg Oral Daily  . pregabalin  75 mg Oral BID  . rivastigmine  9.5 mg Transdermal Daily  . sodium chloride  3 mL Intravenous Q12H     Infusions:     PRN Medications:  acetaminophen **OR** acetaminophen, albuterol, ALPRAZolam, nitroGLYCERIN, ondansetron **OR** ondansetron (ZOFRAN) IV   Past Medical History  Diagnosis Date  . Abnormal heart rhythms   . Diabetes     insulin dependent  . Sleep apnea     uses bipap X12 years.  Marland Kitchen COPD (chronic obstructive pulmonary disease)   . CHF (congestive heart failure)   . A-fib   . Anginal pain   . Hypertension   . Stroke     LEFT SIDE RESIDUAL  . Shortness of breath   . Depression   . Chronic kidney disease     STAGE 3   . Memory loss   .  Alzheimer disease   . Leg pain   . Back pain   . Neuropathy     Past Surgical History  Procedure Laterality Date  . Breast surgery      X4  . Abdominal surgery      removed 14 inches of colon  . Hernia repair    . Breast lumpectomy      Family History  Problem Relation Age of Onset  . Heart disease Mother   . Heart disease Sister   . Dementia Sister     Social History Kara Ortega reports that she quit smoking about 46 years ago. Her smoking use included Cigarettes. She has a 27 pack-year smoking history. She has never used smokeless tobacco. Kara Ortega reports that she does not drink  alcohol.  Review of Systems    Patient denies fever, chills, headache, sweats, rash, change in vision, change in hearing, cough, nausea or vomiting, urinary symptoms. All other systems are reviewed and are negative.  Physical Examination Blood pressure 102/45, pulse 58, temperature 98.6 F (37 C), temperature source Oral, resp. rate 17, height 5\' 1"  (1.549 m), weight 230 lb 2.6 oz (104.4 kg), SpO2 94 %.  Intake/Output Summary (Last 24 hours) at 03/24/14 1515 Last data filed at 03/24/14 0530  Gross per 24 hour  Intake    250 ml  Output    461 ml  Net   -211 ml   Patient is significantly overweight. She is oriented to person time and place. I did not evaluate her overall mental status extensively. Head is atraumatic. Sclerae and conjunctiva are normal. Lungs reveal a few scattered rhonchi. Cardiac exam reveals S1 and S2. There is a crescendo decrescendo murmur consistent with aortic stenosis. The second heart sound is heard. Abdomen is protuberant. There is no significant peripheral edema at this time. I have not done a complete neurologic exam. Neurologic is grossly intact.    Prior Cardiac Testing/Procedures  Lab Results  Basic Metabolic Panel:  Recent Labs Lab 03/23/14 1705 03/24/14 0440  NA 135 139  K 4.5 3.9  CL 100 103  CO2 24 25  GLUCOSE 187* 134*  BUN 54* 46*  CREATININE 2.02* 1.34*  CALCIUM 10.8* 9.8    Liver Function Tests:  Recent Labs Lab 03/23/14 1705 03/24/14 0440  AST 39* 32  ALT 29 25  ALKPHOS 38* 28*  BILITOT 0.2* 0.4  PROT 6.5 6.2  ALBUMIN 4.1 3.7    CBC:  Recent Labs Lab 03/23/14 1705 03/24/14 0440  WBC 9.2 8.0  NEUTROABS 5.9 4.0  HGB 13.7 13.0  HCT 41.6 40.4  MCV 86.7 87.3  PLT 201 162    Cardiac Enzymes:  Recent Labs Lab 03/23/14 1705 03/23/14 2314 03/24/14 0440 03/24/14 0800  TROPONINI <0.03 <0.03 <0.03 <0.03    BNP: Invalid input(s): POCBNP   Radiology: Dg Chest 2 View  03/23/2014   CLINICAL DATA:  Unwitnessed  fall in shower with left-sided chest pain, initial encounter  EXAM: CHEST  2 VIEW  COMPARISON:  None  FINDINGS: Cardiac shadow is within normal limits. The lungs are well aerated bilaterally. No pneumothorax is seen. The bony thorax is within normal limits. Mild degenerative changes of the thoracic spine are seen.  IMPRESSION: No acute abnormality noted.   Electronically Signed   By: Inez Catalina M.D.   On: 03/23/2014 18:48   Dg Thoracic Spine 2 View  03/23/2014   CLINICAL DATA:  Recent fall in shower with back  EXAM: THORACIC SPINE - 2  VIEW  COMPARISON:  None.  FINDINGS: Vertebral body height is well maintained. Mild degenerative changes are noted in the thoracic spine. No paraspinal mass lesion is noted. No rib abnormality is seen.  IMPRESSION: Degenerative change without acute abnormality.   Electronically Signed   By: Inez Catalina M.D.   On: 03/23/2014 18:52   Dg Lumbar Spine Complete  03/23/2014   CLINICAL DATA:  Fall in shower with low back pain, initial encounter  EXAM: LUMBAR SPINE - COMPLETE 4+ VIEW  COMPARISON:  03/04/2014  FINDINGS: Five lumbar type vertebral bodies are well visualized. Disc space narrowing is noted at L2-3 with sclerosis and osteophytic changes. Mild retrolisthesis of L2 on L3 is seen. This is stable from the prior MRI examination. No acute compression deformity is noted. Diffuse aortic calcifications are seen. Postsurgical changes are noted.  IMPRESSION: Chronic changes without acute abnormality.   Electronically Signed   By: Inez Catalina M.D.   On: 03/23/2014 18:50   Dg Ankle Complete Left  03/23/2014   CLINICAL DATA:  Left ankle pain following fall in shower, initial encounter  EXAM: LEFT ANKLE COMPLETE - 3+ VIEW  COMPARISON:  None.  FINDINGS: Generalized soft tissue swelling No acute fracture or dislocation is seen. Is noted both medially and laterally  IMPRESSION: Soft tissue swelling without acute bony abnormality.   Electronically Signed   By: Inez Catalina M.D.   On:  03/23/2014 18:48   Dg Ankle Complete Right  03/23/2014   CLINICAL DATA:  Fall in shower with right ankle pain, initial encounter  EXAM: RIGHT ANKLE - COMPLETE 3+ VIEW  COMPARISON:  None.  FINDINGS: Considerable soft tissue swelling is noted particularly laterally. No acute fracture or dislocation is noted. Small Achilles spur is noted.  IMPRESSION: Lateral soft tissue swelling without acute bony abnormality.   Electronically Signed   By: Inez Catalina M.D.   On: 03/23/2014 18:49   Ct Head Wo Contrast  03/23/2014   CLINICAL DATA:  Recent fall in shower, neck pain, initial encounter  EXAM: CT HEAD WITHOUT CONTRAST  CT CERVICAL SPINE WITHOUT CONTRAST  TECHNIQUE: Multidetector CT imaging of the head and cervical spine was performed following the standard protocol without intravenous contrast. Multiplanar CT image reconstructions of the cervical spine were also generated.  COMPARISON:  None.  FINDINGS: CT HEAD FINDINGS  The bony calvarium is intact. Atrophic changes are seen. Mild chronic white matter ischemic change is noted. No findings to suggest acute hemorrhage, acute infarction or space-occupying mass lesion are seen.  CT CERVICAL SPINE FINDINGS  Seven cervical segments are well visualized. Vertebral body height is well maintained. Multilevel facet hypertrophic changes are seen. No acute fracture or acute facet abnormality is noted. The surrounding soft tissues to include the lung apices are within normal limits.  IMPRESSION: CT of the head: Chronic atrophic and ischemic changes without acute abnormality.  CT of the cervical spine: Multilevel degenerative change without acute abnormality.   Electronically Signed   By: Inez Catalina M.D.   On: 03/23/2014 19:01   Ct Cervical Spine Wo Contrast  03/23/2014   CLINICAL DATA:  Recent fall in shower, neck pain, initial encounter  EXAM: CT HEAD WITHOUT CONTRAST  CT CERVICAL SPINE WITHOUT CONTRAST  TECHNIQUE: Multidetector CT imaging of the head and cervical spine  was performed following the standard protocol without intravenous contrast. Multiplanar CT image reconstructions of the cervical spine were also generated.  COMPARISON:  None.  FINDINGS: CT HEAD FINDINGS  The bony calvarium is  intact. Atrophic changes are seen. Mild chronic white matter ischemic change is noted. No findings to suggest acute hemorrhage, acute infarction or space-occupying mass lesion are seen.  CT CERVICAL SPINE FINDINGS  Seven cervical segments are well visualized. Vertebral body height is well maintained. Multilevel facet hypertrophic changes are seen. No acute fracture or acute facet abnormality is noted. The surrounding soft tissues to include the lung apices are within normal limits.  IMPRESSION: CT of the head: Chronic atrophic and ischemic changes without acute abnormality.  CT of the cervical spine: Multilevel degenerative change without acute abnormality.   Electronically Signed   By: Inez Catalina M.D.   On: 03/23/2014 19:01   Dg Shoulder Left  03/23/2014   CLINICAL DATA:  Left shoulder pain following fall in shower, initial encounter  EXAM: LEFT SHOULDER - 2+ VIEW  COMPARISON:  10/28/2013  FINDINGS: Degenerative changes of the acromioclavicular joint are noted. Some mild changes in the humeral head are noted which may be related to prior dislocation or rotator cuff degenerative change. No acute fracture or dislocation is noted.  IMPRESSION: Chronic changes without acute abnormality.   Electronically Signed   By: Inez Catalina M.D.   On: 03/23/2014 18:51    ECG: EKG reveals sinus bradycardia on admission. There was decreased anterior R-wave progression. I suspect this is positional. We know from her echo that she has normal left ventricular function.  Telemetry:   I have reviewed telemetry did a March 24, 2014. Currently there is sinus rhythm. The rate is ranging around 60-65.   Impression and Recommendations    AKI (acute kidney injury)     I cannot fully assess the in and  out since admission. However it is my understanding that her diuretics have been held and that she has been receiving some fluid. She's had significant improvement in her renal function.    A-fib       I history there is paroxysmal atrial fibrillation. She isn't coagulated with Coumadin. With her falls, this will have to be addressed carefully going forward.    Diabetes    Alzheimer's dementia    The patient does carry a diagnosis of Alzheimer's from a neurology visit in September, 2015. She has significant problems with short-term memory here in the hospital. It is very hard to assess her complaints of shortness of breath in this setting.     Accident due to mechanical fall without injury     Decisions about her Coumadin will have to be based on safety factors and what type of care she will receive after the hospital.    Diastolic dysfunction with chronic heart failure     The patient does have a component of diastolic dysfunction. Careful attention will have to be made to the choice of the dosing of her diuretics. It appears that she came into the hospital on the dry side.    Sinus bradycardia     Meds have been adjusted and now her rate is 65.    Warfarin anticoagulation     I have already mentioned  that careful decisions will have to be made about her anticoagulation going forward.    Aortic stenosis      The patient does have moderate aortic stenosis. It is not severe. This certainly could play a role with some of her volume retention. However there is no indication for further evaluation of the aortic stenosis at this time. She has excellent LV function.     Mitral stenosis  There is question of mild functional mitral stenosis on her echo at this time. This is a borderline call. It is not severe. No further workup is indicated. Of course this could play some role in her overall volume retention tendency.    DOE (dyspnea on exertion)     Her shortness of breath is certainly  multifactorial. We know that her overall pulmonary status is good. She has walking difficulties due to her neuropathy. She is markedly overweight. I watched to nurses attempt to move her from the bed to the bedside commode and it took a lot of effort. At times volume overload may play a role in her shortness of breath. Ongoing careful attention will have to be paid to the doses of diuretics used. She was dry on admission. Her aortic stenosis and the question of slight mitral stenosis can certainly play a role in the physiology here. However the treatment is the management of her volume status. There is no indication for further workup of the aortic valvular disease or mitral valvular disease.   Daryel November, MD 03/24/2014, 3:15 PM

## 2014-03-24 NOTE — Progress Notes (Signed)
ANTICOAGULATION CONSULT NOTE - Follow Up Consult  Pharmacy Consult for coumadin Indication: atrial fibrillation  Allergies  Allergen Reactions  . Avandia [Rosiglitazone] Other (See Comments)    edema  . Benazepril Other (See Comments)    unknown  . Dilaudid [Hydromorphone Hcl]     "does not tolerate strong pain medications" per pt  . Fosamax [Alendronate Sodium] Nausea And Vomiting  . Talwin [Pentazocine] Nausea And Vomiting  . Tetanus Toxoids     "allergic to something in the tetanus injection" per patient  . Zyrtec [Cetirizine] Other (See Comments)    unknown    Patient Measurements: Height: 5\' 1"  (154.9 cm) Weight: 230 lb 2.6 oz (104.4 kg) IBW/kg (Calculated) : 47.8  Vital Signs: Temp: 98.6 F (37 C) (12/25 0347) Temp Source: Oral (12/25 0347) BP: 102/45 mmHg (12/25 0600) Pulse Rate: 58 (12/25 0600)  Labs:  Recent Labs  03/23/14 1705 03/23/14 2314 03/24/14 0440  HGB 13.7  --  13.0  HCT 41.6  --  40.4  PLT 201  --  162  LABPROT 32.5*  --  35.5*  INR 3.13*  --  3.51*  CREATININE 2.02*  --  1.34*  TROPONINI <0.03 <0.03 <0.03    Estimated Creatinine Clearance: 36 mL/min (by C-G formula based on Cr of 1.34).   Medications:  Scheduled:  . antiseptic oral rinse  7 mL Mouth Rinse BID  . atorvastatin  10 mg Oral QHS  . citalopram  20 mg Oral Daily  . colesevelam  1,875 mg Oral BID WC  . febuxostat  40 mg Oral Daily  . insulin aspart  0-5 Units Subcutaneous QHS  . insulin aspart  0-9 Units Subcutaneous TID WC  . insulin detemir  64 Units Subcutaneous QHS  . isosorbide mononitrate  30 mg Oral Q12H  . pantoprazole  40 mg Oral Daily  . pregabalin  75 mg Oral BID  . rivastigmine  9.5 mg Transdermal Daily  . sodium chloride  3 mL Intravenous Q12H    Assessment: 80 yo female here s/p fall. She is on coumadin for afib and INR is above goal (INR= 3.51).  Home coumadin dose: 5mg /day except 2.5mg  on TTSa (last dose 12/23)  Goal of Therapy:  INR 2-3 Monitor  platelets by anticoagulation protocol: Yes   Plan:  -Hold coumadin today -Daily PT/INR  Hildred Laser, Pharm D 03/24/2014 8:33 AM

## 2014-03-24 NOTE — Progress Notes (Addendum)
[facility] PROGRESS NOTE  Kara Ortega [id number] DOB: Aug 17, 1929 DOA: 03/23/2014  PCP: [name], MD  Brief HPI: 80 year old Caucasian female was admitted after sustaining a fall at home. Apparently she has been having dizzy spells throughout the day. Apparently her dose of Lasix was increased recently due to weight gain. She was admitted to the hospital. She was found to have bradycardia and borderline low blood pressures.  Past medical history:  Past Medical History  Diagnosis Date  . Abnormal heart rhythms   . Diabetes     insulin dependent  . Sleep apnea     uses bipap X12 years.  Marland Kitchen COPD (chronic obstructive pulmonary disease)   . CHF (congestive heart failure)   . A-fib   . Anginal pain   . Hypertension   . Stroke     LEFT SIDE RESIDUAL  . Shortness of breath   . Depression   . Chronic kidney disease     STAGE 3   . Memory loss   . Alzheimer disease   . Leg pain   . Back pain   . Neuropathy     Consultants: None  Procedures:  2-D echocardiogram. Pending  Antibiotics: None  Subjective: Patient feels better this morning. Denies any chest pain, shortness of breath or dizziness. No nausea, vomiting. Denies any abdominal pain.  Objective: Vital Signs  Filed Vitals:   03/24/14 0100 03/24/14 0220 03/24/14 0347 03/24/14 0600  BP: 77/28 105/46 99/53 102/45  Pulse: 55 62 64 58  Temp:   98.6 F (37 C)   TempSrc:   Oral   Resp: 16 21 16 17   Height:   5\' 1"  (1.549 m)   Weight:   104.4 kg (230 lb 2.6 oz)   SpO2: 92% 92% 93% 94%    Intake/Output Summary (Last 24 hours) at 03/24/14 [id number] Last data filed at 03/24/14 0530  Gross per 24 hour  Intake    250 ml  Output    461 ml  Net   -211 ml   Filed Weights   03/23/14 1834 03/23/14 2137 03/24/14 0347  Weight: 102.967 kg (227 lb) 104.3 kg (229 lb 15 oz) 104.4 kg (230 lb 2.6 oz)    General appearance: alert, cooperative, appears stated age and no distress Resp: clear to  auscultation bilaterally Cardio: S1, S2 is normal, regular. Systolic murmur appreciated over the precordium. No S3, S4. No rubs, or bruit. GI: soft, non-tender; bowel sounds normal; no masses,  no organomegaly Extremities: extremities normal, atraumatic, no cyanosis or edema Skin: Skin color, texture, turgor normal. No rashes or lesions Neurologic: No facial symmetry. Motor strength equal bilateral upper and lower extremities.  Lab Results:  Basic Metabolic Panel:  Recent Labs Lab 03/23/14 1705 03/24/14 0440  NA 135 139  K 4.5 3.9  CL 100 103  CO2 24 25  GLUCOSE 187* 134*  BUN 54* 46*  CREATININE 2.02* 1.34*  CALCIUM 10.8* 9.8   Liver Function Tests:  Recent Labs Lab 03/23/14 1705 03/24/14 0440  AST 39* 32  ALT 29 25  ALKPHOS 38* 28*  BILITOT 0.2* 0.4  PROT 6.5 6.2  ALBUMIN 4.1 3.7   CBC:  Recent Labs Lab 03/23/14 1705 03/24/14 0440  WBC 9.2 8.0  NEUTROABS 5.9 4.0  HGB 13.7 13.0  HCT 41.6 40.4  MCV 86.7 87.3  PLT 201 162   Cardiac Enzymes:  Recent Labs Lab 03/23/14 1705 03/23/14 2314 03/24/14 0440  TROPONINI <0.03 <0.03 <0.03   BNP (last 3  results)  Recent Labs  09/13/13 2350  PROBNP 115.2   CBG:  Recent Labs Lab 03/23/14 1739 03/23/14 2204  GLUCAP 173* 110*    Recent Results (from the past 240 hour(s))  MRSA PCR Screening     Status: None   Collection Time: 03/23/14  9:41 PM  Result Value Ref Range Status   MRSA by PCR NEGATIVE NEGATIVE Final    Comment:        The GeneXpert MRSA Assay (FDA approved for NASAL specimens only), is one component of a comprehensive MRSA colonization surveillance program. It is not intended to diagnose MRSA infection nor to guide or monitor treatment for MRSA infections.       Studies/Results: Dg Chest 2 View  03/23/2014   CLINICAL DATA:  Unwitnessed fall in shower with left-sided chest pain, initial encounter  EXAM: CHEST  2 VIEW  COMPARISON:  None  FINDINGS: Cardiac shadow is within normal  limits. The lungs are well aerated bilaterally. No pneumothorax is seen. The bony thorax is within normal limits. Mild degenerative changes of the thoracic spine are seen.  IMPRESSION: No acute abnormality noted.   Electronically Signed   By: Inez Catalina M.D.   On: 03/23/2014 18:48   Dg Thoracic Spine 2 View  03/23/2014   CLINICAL DATA:  Recent fall in shower with back  EXAM: THORACIC SPINE - 2 VIEW  COMPARISON:  None.  FINDINGS: Vertebral body height is well maintained. Mild degenerative changes are noted in the thoracic spine. No paraspinal mass lesion is noted. No rib abnormality is seen.  IMPRESSION: Degenerative change without acute abnormality.   Electronically Signed   By: Inez Catalina M.D.   On: 03/23/2014 18:52   Dg Lumbar Spine Complete  03/23/2014   CLINICAL DATA:  Fall in shower with low back pain, initial encounter  EXAM: LUMBAR SPINE - COMPLETE 4+ VIEW  COMPARISON:  03/04/2014  FINDINGS: Five lumbar type vertebral bodies are well visualized. Disc space narrowing is noted at L2-3 with sclerosis and osteophytic changes. Mild retrolisthesis of L2 on L3 is seen. This is stable from the prior MRI examination. No acute compression deformity is noted. Diffuse aortic calcifications are seen. Postsurgical changes are noted.  IMPRESSION: Chronic changes without acute abnormality.   Electronically Signed   By: Inez Catalina M.D.   On: 03/23/2014 18:50   Dg Ankle Complete Left  03/23/2014   CLINICAL DATA:  Left ankle pain following fall in shower, initial encounter  EXAM: LEFT ANKLE COMPLETE - 3+ VIEW  COMPARISON:  None.  FINDINGS: Generalized soft tissue swelling No acute fracture or dislocation is seen. Is noted both medially and laterally  IMPRESSION: Soft tissue swelling without acute bony abnormality.   Electronically Signed   By: Inez Catalina M.D.   On: 03/23/2014 18:48   Dg Ankle Complete Right  03/23/2014   CLINICAL DATA:  Fall in shower with right ankle pain, initial encounter  EXAM: RIGHT  ANKLE - COMPLETE 3+ VIEW  COMPARISON:  None.  FINDINGS: Considerable soft tissue swelling is noted particularly laterally. No acute fracture or dislocation is noted. Small Achilles spur is noted.  IMPRESSION: Lateral soft tissue swelling without acute bony abnormality.   Electronically Signed   By: Inez Catalina M.D.   On: 03/23/2014 18:49   Ct Head Wo Contrast  03/23/2014   CLINICAL DATA:  Recent fall in shower, neck pain, initial encounter  EXAM: CT HEAD WITHOUT CONTRAST  CT CERVICAL SPINE WITHOUT CONTRAST  TECHNIQUE: Multidetector CT  imaging of the head and cervical spine was performed following the standard protocol without intravenous contrast. Multiplanar CT image reconstructions of the cervical spine were also generated.  COMPARISON:  None.  FINDINGS: CT HEAD FINDINGS  The bony calvarium is intact. Atrophic changes are seen. Mild chronic white matter ischemic change is noted. No findings to suggest acute hemorrhage, acute infarction or space-occupying mass lesion are seen.  CT CERVICAL SPINE FINDINGS  Seven cervical segments are well visualized. Vertebral body height is well maintained. Multilevel facet hypertrophic changes are seen. No acute fracture or acute facet abnormality is noted. The surrounding soft tissues to include the lung apices are within normal limits.  IMPRESSION: CT of the head: Chronic atrophic and ischemic changes without acute abnormality.  CT of the cervical spine: Multilevel degenerative change without acute abnormality.   Electronically Signed   By: Inez Catalina M.D.   On: 03/23/2014 19:01   Ct Cervical Spine Wo Contrast  03/23/2014   CLINICAL DATA:  Recent fall in shower, neck pain, initial encounter  EXAM: CT HEAD WITHOUT CONTRAST  CT CERVICAL SPINE WITHOUT CONTRAST  TECHNIQUE: Multidetector CT imaging of the head and cervical spine was performed following the standard protocol without intravenous contrast. Multiplanar CT image reconstructions of the cervical spine were also  generated.  COMPARISON:  None.  FINDINGS: CT HEAD FINDINGS  The bony calvarium is intact. Atrophic changes are seen. Mild chronic white matter ischemic change is noted. No findings to suggest acute hemorrhage, acute infarction or space-occupying mass lesion are seen.  CT CERVICAL SPINE FINDINGS  Seven cervical segments are well visualized. Vertebral body height is well maintained. Multilevel facet hypertrophic changes are seen. No acute fracture or acute facet abnormality is noted. The surrounding soft tissues to include the lung apices are within normal limits.  IMPRESSION: CT of the head: Chronic atrophic and ischemic changes without acute abnormality.  CT of the cervical spine: Multilevel degenerative change without acute abnormality.   Electronically Signed   By: Inez Catalina M.D.   On: 03/23/2014 19:01   Dg Shoulder Left  03/23/2014   CLINICAL DATA:  Left shoulder pain following fall in shower, initial encounter  EXAM: LEFT SHOULDER - 2+ VIEW  COMPARISON:  10/28/2013  FINDINGS: Degenerative changes of the acromioclavicular joint are noted. Some mild changes in the humeral head are noted which may be related to prior dislocation or rotator cuff degenerative change. No acute fracture or dislocation is noted.  IMPRESSION: Chronic changes without acute abnormality.   Electronically Signed   By: Inez Catalina M.D.   On: 03/23/2014 18:51    Medications:  Scheduled: . antiseptic oral rinse  7 mL Mouth Rinse BID  . atorvastatin  10 mg Oral QHS  . citalopram  20 mg Oral Daily  . colesevelam  1,875 mg Oral BID WC  . febuxostat  40 mg Oral Daily  . insulin aspart  0-5 Units Subcutaneous QHS  . insulin aspart  0-9 Units Subcutaneous TID WC  . insulin detemir  64 Units Subcutaneous QHS  . isosorbide mononitrate  30 mg Oral Q12H  . pantoprazole  40 mg Oral Daily  . pregabalin  75 mg Oral BID  . rivastigmine  9.5 mg Transdermal Daily  . sodium chloride  3 mL Intravenous Q12H   Continuous:    [id number] **OR** acetaminophen, albuterol, ALPRAZolam, nitroGLYCERIN, ondansetron **OR** ondansetron (ZOFRAN) IV  Assessment/Plan:  Principal Problem:   AKI (acute kidney injury) Active Problems:   A-fib   Diabetes  Alzheimer's dementia   Accident due to mechanical fall without injury   Diastolic dysfunction with chronic heart failure   Sinus bradycardia   Hypotension   OSA on CPAP    AKI (acute kidney injury) Creatinine is improved. She was taken off of Lasix. She received some IV fluids as well. Saline lock for now. Repeat renal function in the morning. Monitor urine output. Hold off on Lasix for now.   Near syncope/Fall She was bradycardic and hypotensive. There is no evidence for significant AV block. We're holding her rate limiting drugs. It is also possible that she may have taken more of her medicines than she should have. We will also stop her Imdur due to low blood pressure. Echocardiogram has been ordered and is pending. Consult PT and OT. Start mobilizing as tolerated.  Hypotension and sinus bradycardia. See above. TSH is normal. Await echocardiogram. Monitor in step down unit for now. Holding her antihypertensives for now.  History of atrial fibrillation. Appears to be paroxysmal. She is currently in sinus rhythm. She is on warfarin, which is being dosed by pharmacy. INR is supratherapeutic.  History of dementia without behavioral disturbances.  Continue to monitor. Reorient daily.  History of obstructive sleep apnea. Continue with CPAP  DVT Prophylaxis: On warfarin    Code Status: Full code  Family Communication: No family at bedside yet  Disposition Plan: Will remain in step down for today.    LOS: 1 day   [facility] Pager [phone] 03/24/2014, 7:52 AM  If 7PM-7AM, please contact night-coverage at [email or web address], password [facility]

## 2014-03-25 LAB — BASIC METABOLIC PANEL
Anion gap: 10 (ref 5–15)
BUN: 33 mg/dL — ABNORMAL HIGH (ref 6–23)
CALCIUM: 9.3 mg/dL (ref 8.4–10.5)
CO2: 26 mmol/L (ref 19–32)
Chloride: 102 mEq/L (ref 96–112)
Creatinine, Ser: 1.04 mg/dL (ref 0.50–1.10)
GFR, EST AFRICAN AMERICAN: 56 mL/min — AB (ref >90)
GFR, EST NON AFRICAN AMERICAN: 49 mL/min — AB (ref >90)
Glucose, Bld: 206 mg/dL — ABNORMAL HIGH (ref 70–99)
POTASSIUM: 3.8 mmol/L (ref 3.5–5.1)
SODIUM: 138 mmol/L (ref 135–145)

## 2014-03-25 LAB — CBC
HCT: 41.3 % (ref 36.0–46.0)
Hemoglobin: 13.2 g/dL (ref 12.0–15.0)
MCH: 27.6 pg (ref 26.0–34.0)
MCHC: 32 g/dL (ref 30.0–36.0)
MCV: 86.2 fL (ref 78.0–100.0)
Platelets: 175 10*3/uL (ref 150–400)
RBC: 4.79 MIL/uL (ref 3.87–5.11)
RDW: 16.1 % — AB (ref 11.5–15.5)
WBC: 8 10*3/uL (ref 4.0–10.5)

## 2014-03-25 LAB — GLUCOSE, CAPILLARY
GLUCOSE-CAPILLARY: 152 mg/dL — AB (ref 70–99)
GLUCOSE-CAPILLARY: 307 mg/dL — AB (ref 70–99)

## 2014-03-25 LAB — PROTIME-INR
INR: 2.6 — ABNORMAL HIGH (ref 0.00–1.49)
PROTHROMBIN TIME: 28 s — AB (ref 11.6–15.2)

## 2014-03-25 MED ORDER — WARFARIN SODIUM 2.5 MG PO TABS
2.5000 mg | ORAL_TABLET | Freq: Once | ORAL | Status: AC
  Administered 2014-03-25: 2.5 mg via ORAL
  Filled 2014-03-25: qty 1

## 2014-03-25 MED ORDER — INSULIN DETEMIR 100 UNIT/ML ~~LOC~~ SOLN
70.0000 [IU] | Freq: Every day | SUBCUTANEOUS | Status: DC
  Administered 2014-03-26 (×2): 70 [IU] via SUBCUTANEOUS
  Filled 2014-03-25 (×3): qty 0.7

## 2014-03-25 MED ORDER — WARFARIN - PHARMACIST DOSING INPATIENT
Freq: Every day | Status: DC
  Administered 2014-03-25 – 2014-03-26 (×2)
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
## 2014-03-25 NOTE — Progress Notes (Signed)
ANTICOAGULATION CONSULT NOTE - Follow Up Consult  Pharmacy Consult for coumadin Indication: atrial fibrillation  Allergies  Allergen Reactions  . Avandia [Rosiglitazone] Other (See Comments)    edema  . Benazepril Other (See Comments)    unknown  . Dilaudid [Hydromorphone Hcl]     "does not tolerate strong pain medications" per pt  . Fosamax [Alendronate Sodium] Nausea And Vomiting  . Talwin [Pentazocine] Nausea And Vomiting  . Tetanus Toxoids     "allergic to something in the tetanus injection" per patient  . Zyrtec [Cetirizine] Other (See Comments)    unknown    Patient Measurements: Height: 5\' 1"  (154.9 cm) Weight: 226 lb 13.7 oz (102.9 kg) IBW/kg (Calculated) : 47.8  Vital Signs: Temp: 97.5 F (36.4 C) (12/26 0735) Temp Source: Oral (12/26 0735) BP: 122/41 mmHg (12/26 0735) Pulse Rate: 64 (12/26 0735)  Labs:  Recent Labs  03/23/14 1705 03/23/14 2314 03/24/14 0440 03/24/14 0800 03/25/14 0356  HGB 13.7  --  13.0  --  13.2  HCT 41.6  --  40.4  --  41.3  PLT 201  --  162  --  175  LABPROT 32.5*  --  35.5*  --  28.0*  INR 3.13*  --  3.51*  --  2.60*  CREATININE 2.02*  --  1.34*  --  1.04  TROPONINI <0.03 <0.03 <0.03 <0.03  --     Estimated Creatinine Clearance: 46 mL/min (by C-G formula based on Cr of 1.04).   Medications:  Scheduled:  . antiseptic oral rinse  7 mL Mouth Rinse BID  . atorvastatin  10 mg Oral QHS  . citalopram  20 mg Oral Daily  . colesevelam  1,875 mg Oral BID WC  . febuxostat  40 mg Oral Daily  . insulin aspart  0-5 Units Subcutaneous QHS  . insulin aspart  0-9 Units Subcutaneous TID WC  . insulin detemir  70 Units Subcutaneous QHS  . pantoprazole  40 mg Oral Daily  . pregabalin  75 mg Oral BID  . rivastigmine  9.5 mg Transdermal Daily  . sodium chloride  3 mL Intravenous Q12H    Assessment: 80 yo female here s/p fall. She is on coumadin for afib and INR on admit was above goal at 3.13. CT head without any acute abnormalities.  INR today is back in therapeutic range at 2.60 after 2 held doses so will restart cautiously.   Home coumadin dose: 5mg /day except 2.5mg  on TTSa (last dose 12/23)  Goal of Therapy:  INR 2-3 Monitor platelets by anticoagulation protocol: Yes   Plan:  - Coumadin 2.5 mg PO x 1 tonight - Daily PT/INR - Monitor CBC, s/s bleeding - F/u long-term AC plans  Derry Arbogast K. Velva Harman, PharmD Clinical Pharmacist - Resident Pager: [phone] Pharmacy: [phone] 03/25/2014 1:02 PM

## 2014-03-25 NOTE — Progress Notes (Addendum)
[facility] PROGRESS NOTE  Kara Ortega [id number] DOB: April 29, 1929 DOA: 03/23/2014  PCP: [name], MD  Brief HPI: 80 year old Caucasian female was admitted after sustaining a fall at home. Apparently she has been having dizzy spells throughout the day. Apparently her dose of Lasix was increased recently due to weight gain. She was admitted to the hospital. She was found to have bradycardia and borderline low blood pressures.  Past medical history:  Past Medical History  Diagnosis Date  . Abnormal heart rhythms   . Diabetes     insulin dependent  . Sleep apnea     uses bipap X12 years.  Marland Kitchen COPD (chronic obstructive pulmonary disease)   . CHF (congestive heart failure)   . A-fib   . Anginal pain   . Hypertension   . Stroke     LEFT SIDE RESIDUAL  . Shortness of breath   . Depression   . Chronic kidney disease     STAGE 3   . Memory loss   . Alzheimer disease   . Leg pain   . Back pain   . Neuropathy     Consultants: None  Procedures:  2-D echocardiogram. Study Conclusions - Left ventricle: The cavity size was normal. Wall thickness wasincreased in a pattern of mild LVH. The estimated ejectionfraction was 70%. Wall motion was normal; there were no regionalwall motion abnormalities. - Aortic valve: Moderate thickening/calcification. Moderatelysevere AS. - Mitral valve: MAC and leaflet thickening. Gradients and visualsuggest some mitral stenosis. - Left atrium: The atrium was moderately dilated. - Right atrium: The atrium was mildly dilated. - Pulmonary arteries: PA peak pressure: 35 mm Hg (S).  Antibiotics: None  Subjective: Patient feels well. She denies any chest pain, shortness of breath. She somewhat confused at times.   Objective: Vital Signs  Filed Vitals:   03/25/14 0023 03/25/14 0400 03/25/14 0500 03/25/14 0735  BP:  87/72  122/41  Pulse: 76 67  64  Temp:  98.2 F (36.8 C)  97.5 F (36.4 C)  TempSrc:  Oral  Oral    Resp: 16 13  13   Height:      Weight:   102.9 kg (226 lb 13.7 oz)   SpO2: 96% 96%  95%    Intake/Output Summary (Last 24 hours) at 03/25/14 0811 Last data filed at 03/24/14 2332  Gross per 24 hour  Intake    950 ml  Output   1400 ml  Net   -450 ml   Filed Weights   03/23/14 2137 03/24/14 0347 03/25/14 0500  Weight: 104.3 kg (229 lb 15 oz) 104.4 kg (230 lb 2.6 oz) 102.9 kg (226 lb 13.7 oz)    General appearance: alert, cooperative, distracted, appears stated age and no distress Resp: clear to auscultation bilaterally Cardio: S1, S2 is normal, regular. Systolic murmur appreciated over the precordium. No S3, S4. No rubs, or bruit. GI: soft, non-tender; bowel sounds normal; no masses,  no organomegaly Extremities: extremities normal, atraumatic, no cyanosis or edema Neurologic: No facial symmetry. Motor strength equal bilateral upper and lower extremities.  Lab Results:  Basic Metabolic Panel:  Recent Labs Lab 03/23/14 1705 03/24/14 0440 03/25/14 0356  NA 135 139 138  K 4.5 3.9 3.8  CL 100 103 102  CO2 24 25 26   GLUCOSE 187* 134* 206*  BUN 54* 46* 33*  CREATININE 2.02* 1.34* 1.04  CALCIUM 10.8* 9.8 9.3   Liver Function Tests:  Recent Labs Lab 03/23/14 1705 03/24/14 0440  AST 39* 32  ALT 29 25  ALKPHOS 38* 28*  BILITOT 0.2* 0.4  PROT 6.5 6.2  ALBUMIN 4.1 3.7   CBC:  Recent Labs Lab 03/23/14 1705 03/24/14 0440 03/25/14 0356  WBC 9.2 8.0 8.0  NEUTROABS 5.9 4.0  --   HGB 13.7 13.0 13.2  HCT 41.6 40.4 41.3  MCV 86.7 87.3 86.2  PLT 201 162 175   Cardiac Enzymes:  Recent Labs Lab 03/23/14 1705 03/23/14 2314 03/24/14 0440 03/24/14 0800  TROPONINI <0.03 <0.03 <0.03 <0.03   BNP (last 3 results)  Recent Labs  09/13/13 2350  PROBNP 115.2   CBG:  Recent Labs Lab 03/23/14 2204 03/24/14 0836 03/24/14 1227 03/24/14 1552 03/24/14 2142  GLUCAP 110* 124* 227* 206* 240*    Recent Results (from the past 240 hour(s))  MRSA PCR Screening      Status: None   Collection Time: 03/23/14  9:41 PM  Result Value Ref Range Status   MRSA by PCR NEGATIVE NEGATIVE Final    Comment:        The GeneXpert MRSA Assay (FDA approved for NASAL specimens only), is one component of a comprehensive MRSA colonization surveillance program. It is not intended to diagnose MRSA infection nor to guide or monitor treatment for MRSA infections.       Studies/Results: Dg Chest 2 View  03/23/2014   CLINICAL DATA:  Unwitnessed fall in shower with left-sided chest pain, initial encounter  EXAM: CHEST  2 VIEW  COMPARISON:  None  FINDINGS: Cardiac shadow is within normal limits. The lungs are well aerated bilaterally. No pneumothorax is seen. The bony thorax is within normal limits. Mild degenerative changes of the thoracic spine are seen.  IMPRESSION: No acute abnormality noted.   Electronically Signed   By: Inez Catalina M.D.   On: 03/23/2014 18:48   Dg Thoracic Spine 2 View  03/23/2014   CLINICAL DATA:  Recent fall in shower with back  EXAM: THORACIC SPINE - 2 VIEW  COMPARISON:  None.  FINDINGS: Vertebral body height is well maintained. Mild degenerative changes are noted in the thoracic spine. No paraspinal mass lesion is noted. No rib abnormality is seen.  IMPRESSION: Degenerative change without acute abnormality.   Electronically Signed   By: Inez Catalina M.D.   On: 03/23/2014 18:52   Dg Lumbar Spine Complete  03/23/2014   CLINICAL DATA:  Fall in shower with low back pain, initial encounter  EXAM: LUMBAR SPINE - COMPLETE 4+ VIEW  COMPARISON:  03/04/2014  FINDINGS: Five lumbar type vertebral bodies are well visualized. Disc space narrowing is noted at L2-3 with sclerosis and osteophytic changes. Mild retrolisthesis of L2 on L3 is seen. This is stable from the prior MRI examination. No acute compression deformity is noted. Diffuse aortic calcifications are seen. Postsurgical changes are noted.  IMPRESSION: Chronic changes without acute abnormality.    Electronically Signed   By: Inez Catalina M.D.   On: 03/23/2014 18:50   Dg Ankle Complete Left  03/23/2014   CLINICAL DATA:  Left ankle pain following fall in shower, initial encounter  EXAM: LEFT ANKLE COMPLETE - 3+ VIEW  COMPARISON:  None.  FINDINGS: Generalized soft tissue swelling No acute fracture or dislocation is seen. Is noted both medially and laterally  IMPRESSION: Soft tissue swelling without acute bony abnormality.   Electronically Signed   By: Inez Catalina M.D.   On: 03/23/2014 18:48   Dg Ankle Complete Right  03/23/2014   CLINICAL DATA:  Fall in shower with right ankle  pain, initial encounter  EXAM: RIGHT ANKLE - COMPLETE 3+ VIEW  COMPARISON:  None.  FINDINGS: Considerable soft tissue swelling is noted particularly laterally. No acute fracture or dislocation is noted. Small Achilles spur is noted.  IMPRESSION: Lateral soft tissue swelling without acute bony abnormality.   Electronically Signed   By: Inez Catalina M.D.   On: 03/23/2014 18:49   Ct Head Wo Contrast  03/23/2014   CLINICAL DATA:  Recent fall in shower, neck pain, initial encounter  EXAM: CT HEAD WITHOUT CONTRAST  CT CERVICAL SPINE WITHOUT CONTRAST  TECHNIQUE: Multidetector CT imaging of the head and cervical spine was performed following the standard protocol without intravenous contrast. Multiplanar CT image reconstructions of the cervical spine were also generated.  COMPARISON:  None.  FINDINGS: CT HEAD FINDINGS  The bony calvarium is intact. Atrophic changes are seen. Mild chronic white matter ischemic change is noted. No findings to suggest acute hemorrhage, acute infarction or space-occupying mass lesion are seen.  CT CERVICAL SPINE FINDINGS  Seven cervical segments are well visualized. Vertebral body height is well maintained. Multilevel facet hypertrophic changes are seen. No acute fracture or acute facet abnormality is noted. The surrounding soft tissues to include the lung apices are within normal limits.  IMPRESSION: CT  of the head: Chronic atrophic and ischemic changes without acute abnormality.  CT of the cervical spine: Multilevel degenerative change without acute abnormality.   Electronically Signed   By: Inez Catalina M.D.   On: 03/23/2014 19:01   Ct Cervical Spine Wo Contrast  03/23/2014   CLINICAL DATA:  Recent fall in shower, neck pain, initial encounter  EXAM: CT HEAD WITHOUT CONTRAST  CT CERVICAL SPINE WITHOUT CONTRAST  TECHNIQUE: Multidetector CT imaging of the head and cervical spine was performed following the standard protocol without intravenous contrast. Multiplanar CT image reconstructions of the cervical spine were also generated.  COMPARISON:  None.  FINDINGS: CT HEAD FINDINGS  The bony calvarium is intact. Atrophic changes are seen. Mild chronic white matter ischemic change is noted. No findings to suggest acute hemorrhage, acute infarction or space-occupying mass lesion are seen.  CT CERVICAL SPINE FINDINGS  Seven cervical segments are well visualized. Vertebral body height is well maintained. Multilevel facet hypertrophic changes are seen. No acute fracture or acute facet abnormality is noted. The surrounding soft tissues to include the lung apices are within normal limits.  IMPRESSION: CT of the head: Chronic atrophic and ischemic changes without acute abnormality.  CT of the cervical spine: Multilevel degenerative change without acute abnormality.   Electronically Signed   By: Inez Catalina M.D.   On: 03/23/2014 19:01   Dg Shoulder Left  03/23/2014   CLINICAL DATA:  Left shoulder pain following fall in shower, initial encounter  EXAM: LEFT SHOULDER - 2+ VIEW  COMPARISON:  10/28/2013  FINDINGS: Degenerative changes of the acromioclavicular joint are noted. Some mild changes in the humeral head are noted which may be related to prior dislocation or rotator cuff degenerative change. No acute fracture or dislocation is noted.  IMPRESSION: Chronic changes without acute abnormality.   Electronically Signed    By: Inez Catalina M.D.   On: 03/23/2014 18:51    Medications:  Scheduled: . antiseptic oral rinse  7 mL Mouth Rinse BID  . atorvastatin  10 mg Oral QHS  . citalopram  20 mg Oral Daily  . colesevelam  1,875 mg Oral BID WC  . febuxostat  40 mg Oral Daily  . insulin aspart  0-5  Units Subcutaneous QHS  . insulin aspart  0-9 Units Subcutaneous TID WC  . insulin detemir  70 Units Subcutaneous QHS  . pantoprazole  40 mg Oral Daily  . pregabalin  75 mg Oral BID  . rivastigmine  9.5 mg Transdermal Daily  . sodium chloride  3 mL Intravenous Q12H   Continuous:  [id number] **OR** acetaminophen, albuterol, ALPRAZolam, nitroGLYCERIN, ondansetron **OR** ondansetron (ZOFRAN) IV  Assessment/Plan:  Principal Problem:   AKI (acute kidney injury) Active Problems:   A-fib   Diabetes   Alzheimer's dementia   Accident due to mechanical fall without injury   Diastolic dysfunction with chronic heart failure   Sinus bradycardia   Hypotension   OSA on CPAP   Warfarin anticoagulation   Aortic stenosis   Mitral stenosis   DOE (dyspnea on exertion)   Recurrent falls    AKI (acute kidney injury) Renal function is much improved. This has improved just with cessation of diuretics. She was not continued on IV fluids up. She was admitted from the emergency department. We will need to determine the appropriate dose of Lasix when she is discharged.  Near syncope/Fall She was bradycardic and hypotensive. There is no evidence for significant AV block. We're holding her rate limiting drugs. It is also possible that she may have taken more of her medicines than she should have. We also stopped her Imdur due to low blood pressure. Echocardiogram report is as above. PT and OT evaluation is pending. Start mobilizing as tolerated.  Valvular heart disease Echocardiogram suggested moderately severe aortic stenosis and possibly mitral stenosis. Cardiology was consulted as it was felt that these may be  contributing to her chronic symptoms of dyspnea. Appreciate cardiology input. No further evaluation necessary for her valvular heart disease. Volume control will be the main issue.  Hypotension and sinus bradycardia. See above. TSH is normal. Echocardiogram as above. Blood pressure has improved. She can be transferred to the floor. Will need to determine what medications to resume and what doses.  History of atrial fibrillation. Appears to be paroxysmal. She is currently in sinus rhythm. She is on warfarin, which is being dosed by pharmacy. INR was supratherapeutic. Considering history of fall anticoagulation may have to be revisited. We will wait for PT and OT evaluation.  History of dementia without behavioral disturbances.  Continue to monitor. Reorient daily.  History of obstructive sleep apnea. Continue with home BiPAP  DVT Prophylaxis: On warfarin    Code Status: Full code  Family Communication: Discussed with her Son today. Disposition Plan: Transfer to the floor. Await PT and OT evaluation.    LOS: 2 days   [facility] Pager [phone] 03/25/2014, 8:11 AM  If 7PM-7AM, please contact night-coverage at [email or web address], password [facility]

## 2014-03-25 NOTE — Progress Notes (Addendum)
Pt is now ready for home bipap.  RT setup patient's home bipap machine with nasal pillows.  Sterile water added to humidity chamber.  No frays on cord or obvious defects noted.  Machine turns on and appears to be working appropriately.  Settings 13/9, 2l O2 bleedin.  RN aware.

## 2014-03-25 NOTE — Progress Notes (Signed)
RT Note:  Pt has home unit and places self on/off.  Pt knows to call RN if she has problems to reach respiratory.

## 2014-03-26 DIAGNOSIS — I480 Paroxysmal atrial fibrillation: Secondary | ICD-10-CM

## 2014-03-26 LAB — BASIC METABOLIC PANEL
ANION GAP: 8 (ref 5–15)
BUN: 27 mg/dL — AB (ref 6–23)
CALCIUM: 9.9 mg/dL (ref 8.4–10.5)
CHLORIDE: 103 meq/L (ref 96–112)
CO2: 27 mmol/L (ref 19–32)
CREATININE: 0.94 mg/dL (ref 0.50–1.10)
GFR calc Af Amer: 64 mL/min — ABNORMAL LOW (ref >90)
GFR calc non Af Amer: 55 mL/min — ABNORMAL LOW (ref >90)
Glucose, Bld: 196 mg/dL — ABNORMAL HIGH (ref 70–99)
Potassium: 3.9 mmol/L (ref 3.5–5.1)
Sodium: 138 mmol/L (ref 135–145)

## 2014-03-26 LAB — CBC
HEMATOCRIT: 43.1 % (ref 36.0–46.0)
Hemoglobin: 13.9 g/dL (ref 12.0–15.0)
MCH: 28 pg (ref 26.0–34.0)
MCHC: 32.3 g/dL (ref 30.0–36.0)
MCV: 86.9 fL (ref 78.0–100.0)
PLATELETS: 168 10*3/uL (ref 150–400)
RBC: 4.96 MIL/uL (ref 3.87–5.11)
RDW: 15.8 % — ABNORMAL HIGH (ref 11.5–15.5)
WBC: 8 10*3/uL (ref 4.0–10.5)

## 2014-03-26 LAB — PROTIME-INR
INR: 2.09 — ABNORMAL HIGH (ref 0.00–1.49)
Prothrombin Time: 23.7 seconds — ABNORMAL HIGH (ref 11.6–15.2)

## 2014-03-26 LAB — GLUCOSE, CAPILLARY
Glucose-Capillary: 209 mg/dL — ABNORMAL HIGH (ref 70–99)
Glucose-Capillary: 239 mg/dL — ABNORMAL HIGH (ref 70–99)
Glucose-Capillary: 324 mg/dL — ABNORMAL HIGH (ref 70–99)

## 2014-03-26 MED ORDER — METOPROLOL SUCCINATE ER 25 MG PO TB24
25.0000 mg | ORAL_TABLET | Freq: Every day | ORAL | Status: DC
  Administered 2014-03-26 – 2014-03-27 (×2): 25 mg via ORAL
  Filled 2014-03-26 (×2): qty 1

## 2014-03-26 MED ORDER — WARFARIN SODIUM 2.5 MG PO TABS
2.5000 mg | ORAL_TABLET | Freq: Once | ORAL | Status: AC
  Administered 2014-03-26: 2.5 mg via ORAL
  Filled 2014-03-26: qty 1

## 2014-03-26 NOTE — Progress Notes (Signed)
    Subjective:  Denies CP; continue DOE   Objective:  Filed Vitals:   03/25/14 0700 03/25/14 0735 03/25/14 2042 03/26/14 0439  BP: 122/55 122/41 90/73 121/54  Pulse: 60 64 69 63  Temp:  97.5 F (36.4 C) 98 F (36.7 C) 98.3 F (36.8 C)  TempSrc:  Oral Oral Oral  Resp: 16 13 16 17   Height:      Weight:   228 lb 6.4 oz (103.602 kg)   SpO2: 93% 95% 96% 95%    Intake/Output from previous day:  Intake/Output Summary (Last 24 hours) at 03/26/14 0855 Last data filed at 03/25/14 1923  Gross per 24 hour  Intake    840 ml  Output      0 ml  Net    840 ml    Physical Exam: Physical exam: Well-developed obese in no acute distress.  Skin is warm and dry.  HEENT is normal.  Neck is supple.  Chest is clear to auscultation with normal expansion.  Cardiovascular exam is regular rate and rhythm. 3/6 systolic murmur Abdominal exam nontender or distended. No masses palpated. Extremities show no edema. neuro grossly intact    Lab Results: Basic Metabolic Panel:  Recent Labs  03/25/14 0356 03/26/14 0520  NA 138 138  K 3.8 3.9  CL 102 103  CO2 26 27  GLUCOSE 206* 196*  BUN 33* 27*  CREATININE 1.04 0.94  CALCIUM 9.3 9.9   CBC:  Recent Labs  03/23/14 1705 03/24/14 0440 03/25/14 0356 03/26/14 0520  WBC 9.2 8.0 8.0 8.0  NEUTROABS 5.9 4.0  --   --   HGB 13.7 13.0 13.2 13.9  HCT 41.6 40.4 41.3 43.1  MCV 86.7 87.3 86.2 86.9  PLT 201 162 175 168   Cardiac Enzymes:  Recent Labs  03/23/14 2314 03/24/14 0440 03/24/14 0800  TROPONINI <0.03 <0.03 <0.03     Assessment/Plan:  1 paroxysmal atrial fibrillation-the patient remains in sinus rhythm. She was significantly bradycardic on admission. She was on Cardizem 240 mg daily and Toprol 25 mg daily. Her heart rate has improved. I will resume Toprol 25 mg daily and follow. She is on long-term anticoagulation with Coumadin for paroxysmal atrial fibrillation. She has had multiple falls and I'm concerned the risk  outweighs the benefit. I discussed this in detail with her today. I explained the risk of bleeding with any type of trauma. She would prefer to continue Coumadin at this point as she states she does not want to have a stroke. She is now using a walker by her report and this has much improved her unsteadiness. This will need to be readdressed as an outpatient with Dr. [name]. 2 dyspnea-etiology unclear but most likely a contribution from obesity hypoventilation syndrome and deconditioning. Patient was dehydrated on admission. At discharge would resume Lasix 20 mg daily as needed for pedal edema. 3 acute renal failure-improved following discontinuation of diuretics. 4 aortic stenosis-she will need follow-up echoes in the future. Patient should follow-up with Dr. [name] following discharge.  Kirk Ruths 03/26/2014, 8:55 AM

## 2014-03-26 NOTE — Progress Notes (Signed)
T Note:  Pt places self on/off home cpap unit.  Rt will continue to monitor.

## 2014-03-26 NOTE — Progress Notes (Signed)
Utilization Review Completed.  

## 2014-03-26 NOTE — Progress Notes (Signed)
[facility] PROGRESS NOTE Interim History: 80 year old Caucasian female was admitted after sustaining a fall at home. Apparently she has been having dizzy spells throughout the day. Apparently her dose of Lasix was increased recently due to weight gain. She was admitted to the hospital. She was found to have bradycardia and borderline low blood pressures.   Assessment/Plan: AKI (acute kidney injury): - resolved with hydration and holding diuretics. - only on lasix 20 at home.  - Cr at baseline.  Near syncope/Fall/Chronic atrial fibrillation. - She was bradycardic and hypotensive. - Rate limiting medication, were held now resumed. - It is also possible that she may have taken more of her medicines than she should have.  - Cont to hold her Imdur. - PT and OT evaluation rec SNF. - coumadin per pharmacy, follow up with cardiology.  Valvular heart disease - Echo  moderately severe aortic stenosis and possibly mitral stenosis. - recommended folow up with Dr. [name]  Hypotension and sinus bradycardia. - resolved. - ? Due to overdiuresis and/or overmedications.  History of dementia without behavioral disturbances.  Continue to monitor. Reorient daily.  History of obstructive sleep apnea. Continue with home BiPAP   DVT Prophylaxis: On warfarin  Code Status: Full code  Family Communication: Discussed with her Son today. Disposition Plan: Transfer to the floor. Await PT and OT evaluation.   Consultants:  Cardiology  Procedures: Echo  Antibiotics:  None  HPI/Subjective: Feels better no complains  Objective: Filed Vitals:   03/25/14 0735 03/25/14 2042 03/26/14 0439 03/26/14 0951  BP: 122/41 90/73 121/54 135/72  Pulse: 64 69 63 68  Temp: 97.5 F (36.4 C) 98 F (36.7 C) 98.3 F (36.8 C) 98.1 F (36.7 C)  TempSrc: Oral Oral Oral Oral  Resp: 13 16 17 18   Height:      Weight:  103.602 kg (228 lb 6.4 oz)    SpO2: 95% 96% 95% 96%    Intake/Output  Summary (Last 24 hours) at 03/26/14 1114 Last data filed at 03/26/14 [id number]  Gross per 24 hour  Intake    600 ml  Output      0 ml  Net    600 ml   Filed Weights   03/24/14 0347 03/25/14 0500 03/25/14 2042  Weight: 104.4 kg (230 lb 2.6 oz) 102.9 kg (226 lb 13.7 oz) 103.602 kg (228 lb 6.4 oz)    Exam:  General: Alert, awake, oriented x3, in no acute distress.  HEENT: No bruits, no goiter. -JVD Heart: Regular rate and rhythm..  Lungs: Good air movement, clear Abdomen: Soft, nontender, nondistended, positive bowel sounds.    Data Reviewed: Basic Metabolic Panel:  Recent Labs Lab 03/23/14 1705 03/24/14 0440 03/25/14 0356 03/26/14 0520  NA 135 139 138 138  K 4.5 3.9 3.8 3.9  CL 100 103 102 103  CO2 24 25 26 27   GLUCOSE 187* 134* 206* 196*  BUN 54* 46* 33* 27*  CREATININE 2.02* 1.34* 1.04 0.94  CALCIUM 10.8* 9.8 9.3 9.9   Liver Function Tests:  Recent Labs Lab 03/23/14 1705 03/24/14 0440  AST 39* 32  ALT 29 25  ALKPHOS 38* 28*  BILITOT 0.2* 0.4  PROT 6.5 6.2  ALBUMIN 4.1 3.7   No results for input(s): LIPASE, AMYLASE in the last 168 hours. No results for input(s): AMMONIA in the last 168 hours. CBC:  Recent Labs Lab 03/23/14 1705 03/24/14 0440 03/25/14 0356 03/26/14 0520  WBC 9.2 8.0 8.0 8.0  NEUTROABS 5.9 4.0  --   --  HGB 13.7 13.0 13.2 13.9  HCT 41.6 40.4 41.3 43.1  MCV 86.7 87.3 86.2 86.9  PLT 201 162 175 168   Cardiac Enzymes:  Recent Labs Lab 03/23/14 1705 03/23/14 2314 03/24/14 0440 03/24/14 0800  TROPONINI <0.03 <0.03 <0.03 <0.03   BNP (last 3 results)  Recent Labs  09/13/13 2350  PROBNP 115.2   CBG:  Recent Labs Lab 03/24/14 1227 03/24/14 1552 03/24/14 2142 03/25/14 0734 03/25/14 2038  GLUCAP 227* 206* 240* 152* 307*    Recent Results (from the past 240 hour(s))  MRSA PCR Screening     Status: None   Collection Time: 03/23/14  9:41 PM  Result Value Ref Range Status   MRSA by PCR NEGATIVE NEGATIVE Final     Comment:        The GeneXpert MRSA Assay (FDA approved for NASAL specimens only), is one component of a comprehensive MRSA colonization surveillance program. It is not intended to diagnose MRSA infection nor to guide or monitor treatment for MRSA infections.      Studies: No results found.  Scheduled Meds: . antiseptic oral rinse  7 mL Mouth Rinse BID  . atorvastatin  10 mg Oral QHS  . citalopram  20 mg Oral Daily  . colesevelam  1,875 mg Oral BID WC  . febuxostat  40 mg Oral Daily  . insulin aspart  0-5 Units Subcutaneous QHS  . insulin aspart  0-9 Units Subcutaneous TID WC  . insulin detemir  70 Units Subcutaneous QHS  . metoprolol succinate  25 mg Oral Daily  . pantoprazole  40 mg Oral Daily  . pregabalin  75 mg Oral BID  . rivastigmine  9.5 mg Transdermal Daily  . sodium chloride  3 mL Intravenous Q12H  . Warfarin - Pharmacist Dosing Inpatient   Does not apply q1800   Continuous Infusions:    Charlynne Cousins  [facility] Pager [phone]. If 8PM-8AM, please contact night-coverage at [email or web address], password [facility] 03/26/2014, 11:14 AM  LOS: 3 days

## 2014-03-26 NOTE — Progress Notes (Signed)
ANTICOAGULATION CONSULT NOTE - Follow Up Consult  Pharmacy Consult for coumadin Indication: atrial fibrillation  Allergies  Allergen Reactions  . Avandia [Rosiglitazone] Other (See Comments)    edema  . Benazepril Other (See Comments)    unknown  . Dilaudid [Hydromorphone Hcl]     "does not tolerate strong pain medications" per pt  . Fosamax [Alendronate Sodium] Nausea And Vomiting  . Talwin [Pentazocine] Nausea And Vomiting  . Tetanus Toxoids     "allergic to something in the tetanus injection" per patient  . Zyrtec [Cetirizine] Other (See Comments)    unknown    Patient Measurements: Height: 5\' 1"  (154.9 cm) Weight: 228 lb 6.4 oz (103.602 kg) IBW/kg (Calculated) : 47.8  Vital Signs: Temp: 98.1 F (36.7 C) (12/27 0951) Temp Source: Oral (12/27 0951) BP: 135/72 mmHg (12/27 0951) Pulse Rate: 68 (12/27 0951)  Labs:  Recent Labs  03/23/14 2314 03/24/14 0440 03/24/14 0800 03/25/14 0356 03/26/14 0520  HGB  --  13.0  --  13.2 13.9  HCT  --  40.4  --  41.3 43.1  PLT  --  162  --  175 168  LABPROT  --  35.5*  --  28.0* 23.7*  INR  --  3.51*  --  2.60* 2.09*  CREATININE  --  1.34*  --  1.04 0.94  TROPONINI <0.03 <0.03 <0.03  --   --     Estimated Creatinine Clearance: 51.1 mL/min (by C-G formula based on Cr of 0.94).   Medications:  Scheduled:  . antiseptic oral rinse  7 mL Mouth Rinse BID  . atorvastatin  10 mg Oral QHS  . citalopram  20 mg Oral Daily  . colesevelam  1,875 mg Oral BID WC  . febuxostat  40 mg Oral Daily  . insulin aspart  0-5 Units Subcutaneous QHS  . insulin aspart  0-9 Units Subcutaneous TID WC  . insulin detemir  70 Units Subcutaneous QHS  . metoprolol succinate  25 mg Oral Daily  . pantoprazole  40 mg Oral Daily  . pregabalin  75 mg Oral BID  . rivastigmine  9.5 mg Transdermal Daily  . sodium chloride  3 mL Intravenous Q12H  . Warfarin - Pharmacist Dosing Inpatient   Does not apply q1800    Assessment: 80 yo female here s/p fall.  She is on coumadin for afib and INR on admit was above goal at 3.13. CT head without any acute abnormalities. INR today is back in therapeutic range at 2.60 after 2 held doses so will restart cautiously.   Home coumadin dose: 5mg /day except 2.5mg  on TTSa (last dose 12/23)  INR remains therapeutic at 2.09 following re-initiation of warfarin. Will continue to dose conservatively.  Goal of Therapy:  INR 2-3 Monitor platelets by anticoagulation protocol: Yes   Plan:  - Coumadin 2.5 mg PO x 1 tonight - Daily PT/INR - Monitor CBC, s/s bleeding - F/u long-term AC plans  Kara Ortega. Kara Ortega, PharmD Clinical Pharmacist Pager [phone]  03/26/2014 12:51 PM

## 2014-03-27 LAB — GLUCOSE, CAPILLARY
GLUCOSE-CAPILLARY: 163 mg/dL — AB (ref 70–99)
GLUCOSE-CAPILLARY: 287 mg/dL — AB (ref 70–99)
Glucose-Capillary: 167 mg/dL — ABNORMAL HIGH (ref 70–99)

## 2014-03-27 LAB — PROTIME-INR
INR: 1.86 — ABNORMAL HIGH (ref 0.00–1.49)
Prothrombin Time: 21.6 seconds — ABNORMAL HIGH (ref 11.6–15.2)

## 2014-03-27 MED ORDER — WARFARIN SODIUM 5 MG PO TABS
5.0000 mg | ORAL_TABLET | Freq: Once | ORAL | Status: DC
  Filled 2014-03-27: qty 1

## 2014-03-27 MED ORDER — INSULIN DETEMIR 100 UNIT/ML ~~LOC~~ SOLN: 70.0000 [IU] | Freq: Every day | SUBCUTANEOUS | Status: DC

## 2014-03-27 NOTE — Progress Notes (Signed)
Inpatient Diabetes Program Recommendations  AACE/ADA: New Consensus Statement on Inpatient Glycemic Control (2013)  Target Ranges:  Prepandial:   less than 140 mg/dL      Peak postprandial:   less than 180 mg/dL (1-2 hours)      Critically ill patients:  140 - 180 mg/dL   Post-meal glucose levels are high in 300's Inpatient Diabetes Program Recommendations Correction (SSI): Please increase to moderate tidwc and continue HS scale. Diet: Please add carb modified to diet orders  Thank you, Rosita Kea, RN, CNS, Diabetes Coordinator [phone])

## 2014-03-27 NOTE — Progress Notes (Signed)
Physical Therapy Treatment Patient Details Name: Kara Ortega MRN: [id number] DOB: 02-15-1930 Today's Date: 03/27/2014    History of Present Illness Patient is an 80 yo female admitted 03/23/14 with AKI, s/p fall, hypotension, bradycardia.  PMH:  Arrythmias, Afib, DM, neuropathy, OSA with BiPAP at night, COPD, CHF, HTN, CVA with Lt hemiparesis, depression, CKD, Alzheimers dementia, back pain, recurrent falls    PT Comments    Pt progressing towards physical therapy goals. Pt was educated on general safety with the RW at home, as well as a basic HEP to follow until HHPT begins. Continue to recommend that pt have 24 hour assist at home as pt is unsteady at times and requires cueing for safety awareness. PT wrote down safety goals for pt to follow with walker use. Pt anticipates d/c home today. Will continue to follow until d/c.   Follow Up Recommendations  SNF;Supervision/Assistance - 24 hour     Equipment Recommendations  Rolling walker with 5" wheels    Recommendations for Other Services       Precautions / Restrictions Precautions Precautions: Fall Precaution Comments: Patient reports falling in shower - "not sure what happened".  Multiple falls per chart Restrictions Weight Bearing Restrictions: No    Mobility  Bed Mobility               General bed mobility comments: pt in chair  Transfers Overall transfer level: Needs assistance Equipment used: 1 person hand held assist Transfers: Sit to/from Stand Sit to Stand: Min guard         General transfer comment: Steadying assist to stand at chair. VC's for hand placement on seated surface for safety.   Ambulation/Gait Ambulation/Gait assistance: Min guard Ambulation Distance (Feet): 200 Feet Assistive device: Rolling walker (2 wheeled) Gait Pattern/deviations: Step-through pattern;Decreased stride length;Trunk flexed Gait velocity: Decreased Gait velocity interpretation: Below normal speed for  age/gender General Gait Details: Pt states she uses a rollator at home, using a RW today. Pt took 3 standing rest breaks due to fatigue. Slight SOB noted, however pt recovered well with seated rest break.    Stairs            Wheelchair Mobility    Modified Rankin (Stroke Patients Only)       Balance Overall balance assessment: Needs assistance Sitting-balance support: Feet supported;No upper extremity supported Sitting balance-Leahy Scale: Fair     Standing balance support: No upper extremity supported;During functional activity Standing balance-Leahy Scale: Fair Standing balance comment: Pt able to stand for short time without UE assist. Dynamic balance pt requires support.                     Cognition Arousal/Alertness: Awake/alert Behavior During Therapy: WFL for tasks assessed/performed Overall Cognitive Status: History of cognitive impairments - at baseline                      Exercises      General Comments General comments (skin integrity, edema, etc.): Pt was educated about general safety awareness and basic HEP prior to HHPT coming. Recommended short walks (10-20 feet at a time) a few times before lunch and a few times after lunch to help increase tolerance for functional activity. Discussed need to feel stable with the RW and to wait for someone to be present if she did not feel steady.       Pertinent Vitals/Pain Pain Assessment: No/denies pain    Home Living  Prior Function            PT Goals (current goals can now be found in the care plan section) Acute Rehab PT Goals Patient Stated Goal: Home today PT Goal Formulation: With patient Time For Goal Achievement: 04/01/14 Potential to Achieve Goals: Fair Progress towards PT goals: Progressing toward goals    Frequency  Min 3X/week    PT Plan Current plan remains appropriate    Co-evaluation             End of Session Equipment Utilized  During Treatment: Gait belt Activity Tolerance: Patient tolerated treatment well Patient left: in chair;with call bell/phone within reach;with family/visitor present     Time: [id number] PT Time Calculation (min) (ACUTE ONLY): 44 min  Charges:  $Gait Training: 8-22 mins $Therapeutic Activity: 23-37 mins                    G Codes:      Rolinda Roan Apr 06, 2014, 3:03 PM  Rolinda Roan, PT, DPT Acute Rehabilitation Services Pager: [phone]

## 2014-03-27 NOTE — Care Management Note (Signed)
CARE MANAGEMENT NOTE 03/27/2014  Patient:  Kara Ortega, Kara Ortega   Account Number:  [id number]  Date Initiated:  03/27/2014  Documentation initiated by:  Kara Ortega  Subjective/Objective Assessment:   CM following for progression and d/c planning.     Action/Plan:   03/27/2014 Met with pt x2 and spoke with son, Kara Ortega. Pt selected [facility] for HHRN, HHPT, [place], Education officer, museum and aide. Kara Ortega notified, they are to call the pt son to arrangement appointments as pt is forgetful.   Anticipated DC Date:  03/27/2014   Anticipated DC Plan:  Kara Ortega         [facility] Choice  HOME HEALTH   Choice offered to / List presented to:  C-1 Patient        [name] arranged  HH-1 RN  [name].   Status of service:  Completed, signed off Medicare Important Message given?  YES (If response is "NO", the following Medicare IM given date fields will be blank) Date Medicare IM given:  03/27/2014 Medicare IM given by:  Kara Ortega Date Additional Medicare IM given:   Additional Medicare IM given by:    Discharge Disposition:  [place]  Per UR Regulation:    If discussed at Long Length of Stay Meetings, dates discussed:    Comments:

## 2014-03-27 NOTE — Progress Notes (Signed)
Patient to be discharged to home. PIV removed. Telemetry box #10 removed and returned to nurse's station. Discharge instructions and medications reviewed with patient's son. Home health services set up for patient per CM.   Joellen Jersey, RN.

## 2014-03-27 NOTE — Progress Notes (Signed)
Patient pending discharge, i have sent stuff email to contact patient to schedule followup. Thank you for consulting cardiology  Signed, Almyra Deforest PA Pager: [phone]

## 2014-03-27 NOTE — Progress Notes (Signed)
ANTICOAGULATION CONSULT NOTE - Follow Up Consult  Pharmacy Consult for coumadin Indication: atrial fibrillation  Allergies  Allergen Reactions  . Avandia [Rosiglitazone] Other (See Comments)    edema  . Benazepril Other (See Comments)    unknown  . Dilaudid [Hydromorphone Hcl]     "does not tolerate strong pain medications" per pt  . Fosamax [Alendronate Sodium] Nausea And Vomiting  . Talwin [Pentazocine] Nausea And Vomiting  . Tetanus Toxoids     "allergic to something in the tetanus injection" per patient  . Zyrtec [Cetirizine] Other (See Comments)    unknown    Patient Measurements: Height: 5\' 1"  (154.9 cm) Weight: 236 lb 6.4 oz (107.23 kg) IBW/kg (Calculated) : 47.8  Vital Signs: Temp: 98.8 F (37.1 C) (12/28 0954) Temp Source: Oral (12/28 0954) BP: 125/46 mmHg (12/28 0954) Pulse Rate: 83 (12/28 0954)  Labs:  Recent Labs  03/25/14 0356 03/26/14 0520 03/27/14 0843  HGB 13.2 13.9  --   HCT 41.3 43.1  --   PLT 175 168  --   LABPROT 28.0* 23.7* 21.6*  INR 2.60* 2.09* 1.86*  CREATININE 1.04 0.94  --     Estimated Creatinine Clearance: 52.2 mL/min (by C-G formula based on Cr of 0.94).   Medications:  Scheduled:  . antiseptic oral rinse  7 mL Mouth Rinse BID  . atorvastatin  10 mg Oral QHS  . citalopram  20 mg Oral Daily  . colesevelam  1,875 mg Oral BID WC  . febuxostat  40 mg Oral Daily  . insulin aspart  0-5 Units Subcutaneous QHS  . insulin aspart  0-9 Units Subcutaneous TID WC  . insulin detemir  70 Units Subcutaneous QHS  . metoprolol succinate  25 mg Oral Daily  . pantoprazole  40 mg Oral Daily  . pregabalin  75 mg Oral BID  . rivastigmine  9.5 mg Transdermal Daily  . sodium chloride  3 mL Intravenous Q12H  . Warfarin - Pharmacist Dosing Inpatient   Does not apply q1800    Assessment: 80 yo female here s/p fall. She is on coumadin for afib and INR on admit was above goal at 3.13. CT head without any acute abnormalities. INR today down to 1.86  after holding for 2 days on 12/24 and 12/25. Cardiology recommended to hold Coumadin and she refused. She'll follow-up with her outpatient cardiology for further discussion.  Home coumadin dose: 5mg /day except 2.5mg  on TTSa (last dose 12/23)  Goal of Therapy:  INR 2-3 Monitor platelets by anticoagulation protocol: Yes   Plan:  - Coumadin 5 mg PO x 1 tonight - Daily PT/INR - Monitor CBC, s/s bleeding - F/u long-term AC plans  Kara Ortega, PharmD, BCPS  Clinical Pharmacist  Pager: [phone]  03/27/2014 10:53 AM
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
## 2014-03-27 NOTE — Progress Notes (Signed)
End of shift note- Patient noted to have some baseline confusion. Able to answer orientation questions but unclear in her thoughts. We walked the entire unit before bed and patient noted to have some slight SOB by the time we reached her room. Patient definitely a fall risk. Quick to jump out of bed without calling for help. Bed alarm and all other safety measures remain in place. Patient also verbalized concerns at discharge related to not being able to drive and having home care services. Instructed to speak to Education officer, museum and nurse will report off to oncoming am nurse. No other concerns through out the night.

## 2014-03-27 NOTE — Discharge Summary (Signed)
Physician Discharge Summary  Kara Ortega [id number] DOB: 29-Jul-1929 DOA: 03/23/2014  PCP: Kara Naas, MD  Admit date: 03/23/2014 Discharge date: 03/27/2014  Time spent: 35 minutes  Recommendations for Outpatient Follow-up:  1. Follow up with cardiology.  Discharge Diagnoses:  Principal Problem:   AKI (acute kidney injury) Active Problems:   A-fib   Diabetes   Alzheimer's dementia   Accident due to mechanical fall without injury   Diastolic dysfunction with chronic heart failure   Sinus bradycardia   Hypotension   OSA on CPAP   Warfarin anticoagulation   Aortic stenosis   Mitral stenosis   DOE (dyspnea on exertion)   Recurrent falls   Discharge Condition: stable  Diet recommendation: heart healthy  Filed Weights   03/25/14 0500 03/25/14 2042 03/26/14 2114  Weight: 102.9 kg (226 lb 13.7 oz) 103.602 kg (228 lb 6.4 oz) 107.23 kg (236 lb 6.4 oz)    History of present illness:  80 y.o. female with Past medical history of chronic doesn't dysfunction, sleep apnea with sleep, COPD, A. fib, chronic kidney disease, dementia, diabetes. Patient presented with a fall. The fall has been described by the patient denies she was taking shower and while she was coming out of the shower she slipped on the ground and fell. She has been having dizziness throughout the day and denies any other focal deficits. She denies any fever or chills. The family has noticed that she has been gaining weight and her Lasix was increased from 20 mg to 40 mg for few days and since last 2 days she is back on 20 mg daily dose. No other recent change in her medications reported. Patient did complain of left shoulder pain which has been present for last 4 weeks.  Hospital Course:  AKI (acute kidney injury): - resolved with hydration and holding diuretics. - Cr return to baseline. - We'll go home on Lasix 20 mg daily.  Near syncope/Fall/Chronic atrial fibrillation. - She was bradycardic and  hypotensive. - Multiple imaging studies were done that showed no acute fractures. - Rate limiting medication. - It is also possible that she may have taken more of her medicines than she should have.  - PT and OT evaluation rec SNF and patient refused. Risk and benefits were explained to the patient and she was still wants to go home. - We'll get PT OT and a Education officer, museum for home. - coumadin per pharmacy, follow up with cardiology. - Cardiology also recommended to hold Coumadin and she refused. She'll follow-up with her outpatient cardiology for further discussion.  Valvular heart disease - Echo moderately severe aortic stenosis and possibly mitral stenosis. - recommended folow up with Dr. [name]  Hypotension and sinus bradycardia. - resolved. - ? Due to overdiuresis and/or overmedications.  History of dementia without behavioral disturbances.  Continue to monitor. Reorient daily.  History of obstructive sleep apnea. Continue with home BiPAP   Procedures: CT C-spine CT head without contrast X-ray of the left ankle X-ray of her right ankle Chest x-ray Lumbar spine complete. Left shoulder x-ray   Consultations:  Cardiology  Discharge Exam: Filed Vitals:   03/27/14 0954  BP: 125/46  Pulse: 83  Temp: 98.8 F (37.1 C)  Resp: 18    General: A&O x3 Cardiovascular: RRR Respiratory: good air movement CTA B/L  Discharge Instructions   Discharge Instructions    Diet - low sodium heart healthy    Complete by:  As directed      Increase activity slowly  Complete by:  As directed           Current Discharge Medication List    CONTINUE these medications which have CHANGED   Details  insulin detemir (LEVEMIR) 100 UNIT/ML injection Inject 0.7 mLs (70 Units total) into the skin at bedtime. Qty: 10 mL, Refills: 11      CONTINUE these medications which have NOT CHANGED   Details  albuterol (PROVENTIL HFA;VENTOLIN HFA) 108 (90 BASE) MCG/ACT inhaler Inhale 1-2  puffs into the lungs every 4 (four) hours as needed for wheezing or shortness of breath. Qty: 1 Inhaler, Refills: 11    ALPRAZolam (XANAX) 0.25 MG tablet Take 0.25 mg by mouth 2 (two) times daily as needed for anxiety.    atorvastatin (LIPITOR) 10 MG tablet Take 10 mg by mouth at bedtime.     b complex vitamins tablet Take 1 tablet by mouth daily.    B Complex-C (B-COMPLEX WITH VITAMIN C) tablet Take 1 tablet by mouth daily.    calcium carbonate (OS-CAL - DOSED IN MG OF ELEMENTAL CALCIUM) 1250 MG tablet Take 1 tablet by mouth daily with breakfast.    celecoxib (CELEBREX) 200 MG capsule Take 200 mg by mouth daily.    Cholecalciferol (HM VITAMIN D3) 4000 UNITS CAPS Take 12,000 Units by mouth daily.     citalopram (CELEXA) 20 MG tablet Take 20 mg by mouth daily.    colchicine 0.6 MG tablet Take 0.6 mg by mouth daily as needed (gout).    colesevelam (WELCHOL) 625 MG tablet Take 1,875 mg by mouth 2 (two) times daily with a meal.    diclofenac sodium (VOLTAREN) 1 % GEL Apply 4 g topically 4 (four) times daily as needed (pain).     febuxostat (ULORIC) 40 MG tablet Take 40 mg by mouth daily.    fenofibrate micronized (LOFIBRA) 134 MG capsule Take 134 mg by mouth daily before breakfast.    furosemide (LASIX) 40 MG tablet Take 20 mg by mouth daily.     isosorbide mononitrate (IMDUR) 30 MG 24 hr tablet Take 30 mg by mouth every 12 (twelve) hours.    Liraglutide (VICTOZA) 18 MG/3ML SOPN Inject 1.2 mg into the skin every morning.    losartan (COZAAR) 25 MG tablet Take 25 mg by mouth daily.    metoprolol succinate (TOPROL-XL) 25 MG 24 hr tablet Take 25 mg by mouth daily.    nitroGLYCERIN (NITROSTAT) 0.4 MG SL tablet Place 0.4 mg under the tongue every 5 (five) minutes as needed for chest pain.     pantoprazole (PROTONIX) 40 MG tablet Take 40 mg by mouth daily.    potassium chloride (KLOR-CON) 20 MEQ packet Take 40 mEq by mouth daily.     pregabalin (LYRICA) 75 MG capsule Take 75 mg by  mouth 2 (two) times daily.    rivastigmine (EXELON) 9.5 mg/24hr Place 9.5 mg onto the skin daily.    spironolactone (ALDACTONE) 25 MG tablet Take 25 mg by mouth daily.    warfarin (COUMADIN) 5 MG tablet Take 2.5-5 mg by mouth See admin instructions. 5 mg every day except on Tuesday, Thursday, Saturday patient takes 2.5 mg    zolpidem (AMBIEN) 5 MG tablet Take 5 mg by mouth at bedtime as needed for sleep.    allopurinol (ZYLOPRIM) 100 MG tablet Take 100 mg by mouth daily.    aspirin 81 MG chewable tablet Chew 81 mg by mouth daily.    esomeprazole (NEXIUM) 40 MG capsule Take 40 mg by mouth daily.  HYDROcodone-acetaminophen (NORCO/VICODIN) 5-325 MG per tablet Take 0.5 tablets by mouth every 6 (six) hours as needed for moderate pain.      STOP taking these medications     albuterol (PROVENTIL) (2.5 MG/3ML) 0.083% nebulizer solution      diltiazem (TIAZAC) 240 MG 24 hr capsule      Multiple Vitamin (MULTIVITAMIN WITH MINERALS) TABS tablet        Allergies  Allergen Reactions  . Avandia [Rosiglitazone] Other (See Comments)    edema  . Benazepril Other (See Comments)    unknown  . Dilaudid [Hydromorphone Hcl]     "does not tolerate strong pain medications" per pt  . Fosamax [Alendronate Sodium] Nausea And Vomiting  . Talwin [Pentazocine] Nausea And Vomiting  . Tetanus Toxoids     "allergic to something in the tetanus injection" per patient  . Zyrtec [Cetirizine] Other (See Comments)    unknown      The results of significant diagnostics from this hospitalization (including imaging, microbiology, ancillary and laboratory) are listed below for reference.    Significant Diagnostic Studies: Dg Chest 2 View  03/23/2014   CLINICAL DATA:  Unwitnessed fall in shower with left-sided chest pain, initial encounter  EXAM: CHEST  2 VIEW  COMPARISON:  None  FINDINGS: Cardiac shadow is within normal limits. The lungs are well aerated bilaterally. No pneumothorax is seen. The bony  thorax is within normal limits. Mild degenerative changes of the thoracic spine are seen.  IMPRESSION: No acute abnormality noted.   Electronically Signed   By: Inez Catalina M.D.   On: 03/23/2014 18:48   Dg Thoracic Spine 2 View  03/23/2014   CLINICAL DATA:  Recent fall in shower with back  EXAM: THORACIC SPINE - 2 VIEW  COMPARISON:  None.  FINDINGS: Vertebral body height is well maintained. Mild degenerative changes are noted in the thoracic spine. No paraspinal mass lesion is noted. No rib abnormality is seen.  IMPRESSION: Degenerative change without acute abnormality.   Electronically Signed   By: Inez Catalina M.D.   On: 03/23/2014 18:52   Dg Lumbar Spine Complete  03/23/2014   CLINICAL DATA:  Fall in shower with low back pain, initial encounter  EXAM: LUMBAR SPINE - COMPLETE 4+ VIEW  COMPARISON:  03/04/2014  FINDINGS: Five lumbar type vertebral bodies are well visualized. Disc space narrowing is noted at L2-3 with sclerosis and osteophytic changes. Mild retrolisthesis of L2 on L3 is seen. This is stable from the prior MRI examination. No acute compression deformity is noted. Diffuse aortic calcifications are seen. Postsurgical changes are noted.  IMPRESSION: Chronic changes without acute abnormality.   Electronically Signed   By: Inez Catalina M.D.   On: 03/23/2014 18:50   Dg Ankle Complete Left  03/23/2014   CLINICAL DATA:  Left ankle pain following fall in shower, initial encounter  EXAM: LEFT ANKLE COMPLETE - 3+ VIEW  COMPARISON:  None.  FINDINGS: Generalized soft tissue swelling No acute fracture or dislocation is seen. Is noted both medially and laterally  IMPRESSION: Soft tissue swelling without acute bony abnormality.   Electronically Signed   By: Inez Catalina M.D.   On: 03/23/2014 18:48   Dg Ankle Complete Right  03/23/2014   CLINICAL DATA:  Fall in shower with right ankle pain, initial encounter  EXAM: RIGHT ANKLE - COMPLETE 3+ VIEW  COMPARISON:  None.  FINDINGS: Considerable soft tissue  swelling is noted particularly laterally. No acute fracture or dislocation is noted. Small Achilles spur is  noted.  IMPRESSION: Lateral soft tissue swelling without acute bony abnormality.   Electronically Signed   By: Inez Catalina M.D.   On: 03/23/2014 18:49   Ct Head Wo Contrast  03/23/2014   CLINICAL DATA:  Recent fall in shower, neck pain, initial encounter  EXAM: CT HEAD WITHOUT CONTRAST  CT CERVICAL SPINE WITHOUT CONTRAST  TECHNIQUE: Multidetector CT imaging of the head and cervical spine was performed following the standard protocol without intravenous contrast. Multiplanar CT image reconstructions of the cervical spine were also generated.  COMPARISON:  None.  FINDINGS: CT HEAD FINDINGS  The bony calvarium is intact. Atrophic changes are seen. Mild chronic white matter ischemic change is noted. No findings to suggest acute hemorrhage, acute infarction or space-occupying mass lesion are seen.  CT CERVICAL SPINE FINDINGS  Seven cervical segments are well visualized. Vertebral body height is well maintained. Multilevel facet hypertrophic changes are seen. No acute fracture or acute facet abnormality is noted. The surrounding soft tissues to include the lung apices are within normal limits.  IMPRESSION: CT of the head: Chronic atrophic and ischemic changes without acute abnormality.  CT of the cervical spine: Multilevel degenerative change without acute abnormality.   Electronically Signed   By: Inez Catalina M.D.   On: 03/23/2014 19:01   Ct Cervical Spine Wo Contrast  03/23/2014   CLINICAL DATA:  Recent fall in shower, neck pain, initial encounter  EXAM: CT HEAD WITHOUT CONTRAST  CT CERVICAL SPINE WITHOUT CONTRAST  TECHNIQUE: Multidetector CT imaging of the head and cervical spine was performed following the standard protocol without intravenous contrast. Multiplanar CT image reconstructions of the cervical spine were also generated.  COMPARISON:  None.  FINDINGS: CT HEAD FINDINGS  The bony calvarium is  intact. Atrophic changes are seen. Mild chronic white matter ischemic change is noted. No findings to suggest acute hemorrhage, acute infarction or space-occupying mass lesion are seen.  CT CERVICAL SPINE FINDINGS  Seven cervical segments are well visualized. Vertebral body height is well maintained. Multilevel facet hypertrophic changes are seen. No acute fracture or acute facet abnormality is noted. The surrounding soft tissues to include the lung apices are within normal limits.  IMPRESSION: CT of the head: Chronic atrophic and ischemic changes without acute abnormality.  CT of the cervical spine: Multilevel degenerative change without acute abnormality.   Electronically Signed   By: Inez Catalina M.D.   On: 03/23/2014 19:01   Mr Lumbar Spine Wo Contrast  03/05/2014   CLINICAL DATA:  Severe right-sided low back pain, hip pain, leg pain  EXAM: MRI LUMBAR SPINE WITHOUT CONTRAST  TECHNIQUE: Multiplanar, multisequence MR imaging of the lumbar spine was performed. No intravenous contrast was administered.  COMPARISON:  None.  FINDINGS: The vertebral bodies of the lumbar spine are normal in size. There is 2 mm of retrolisthesis of L2 on L3. The right remainder the lumbar spine is normal in alignment. There is normal bone marrow signal demonstrated throughout the vertebra. There is severe degenerative disc disease with disc height loss and Modic endplate changes at [id number].  The spinal cord is normal in signal and contour. The cord terminates normally at L1 . The nerve roots of the cauda equina and the filum terminale are normal.  The visualized portions of the SI joints are unremarkable.  The imaged intra-abdominal contents are unremarkable.  T11-12:  Small right paracentral disc protrusion.  T12-L1: Small right paracentral disc protrusion. No evidence of neural foraminal stenosis. No central canal stenosis.  L1-L2: Mild  broad-based disc bulge with a small central disc protrusion. There is a 6 mm T2 hyperintense rounded  structure along the superior aspect of the right neural foramen which may represent a perineural cyst versus a synovial cyst without nerve root impingement or foraminal stenosis. No left foraminal stenosis No central canal stenosis.  L2-L3: Mild broad-based disc bulge eccentric towards the right and is in close proximity to the right extra foraminal L2 nerve root. Mild right foraminal stenosis. No left foraminal stenosis. Mild right lateral recess stenosis. Mild central canal narrowing.  L3-L4: Mild broad-based disc bulge. Mild bilateral facet arthropathy. No evidence of neural foraminal stenosis. No central canal stenosis.  L4-L5: No significant disc bulge. No evidence of neural foraminal stenosis. No central canal stenosis. Moderate bilateral facet arthropathy with bilateral lateral recess stenosis.  L5-S1: Mild broad-based disc bulge. Severe left and mild right facet arthropathy. Severe left foraminal stenosis. No right foraminal stenosis. No central canal stenosis.  IMPRESSION: 1. At T11-12 and T12-L1 there is a small right paracentral disc protrusion. 2. At L2-3 there is a mild broad-based disc bulge eccentric towards the right which is in close proximity to the right extra foraminal L2 nerve root. Mild right lateral recess stenosis. 3. At L4-5 there is moderate bilateral facet arthropathy with bilateral lateral recess stenosis. 4. At L5-S1 there is a mild broad-based disc bulge and severe left and mild right facet arthropathy resulting in severe right foraminal stenosis. 5. At L1-2 there is a 6 mm T2 hyperintense rounded structure along the superior aspect of the right neural foramen which may represent a perineural cyst versus a synovial cyst without nerve root impingement or foraminal   Electronically Signed   By: Kathreen Devoid   On: 03/05/2014 13:37   Dg Shoulder Left  03/23/2014   CLINICAL DATA:  Left shoulder pain following fall in shower, initial encounter  EXAM: LEFT SHOULDER - 2+ VIEW  COMPARISON:   10/28/2013  FINDINGS: Degenerative changes of the acromioclavicular joint are noted. Some mild changes in the humeral head are noted which may be related to prior dislocation or rotator cuff degenerative change. No acute fracture or dislocation is noted.  IMPRESSION: Chronic changes without acute abnormality.   Electronically Signed   By: Inez Catalina M.D.   On: 03/23/2014 18:51    Microbiology: Recent Results (from the past 240 hour(s))  MRSA PCR Screening     Status: None   Collection Time: 03/23/14  9:41 PM  Result Value Ref Range Status   MRSA by PCR NEGATIVE NEGATIVE Final    Comment:        The GeneXpert MRSA Assay (FDA approved for NASAL specimens only), is one component of a comprehensive MRSA colonization surveillance program. It is not intended to diagnose MRSA infection nor to guide or monitor treatment for MRSA infections.      Labs: Basic Metabolic Panel:  Recent Labs Lab 03/23/14 1705 03/24/14 0440 03/25/14 0356 03/26/14 0520  NA 135 139 138 138  K 4.5 3.9 3.8 3.9  CL 100 103 102 103  CO2 24 25 26 27   GLUCOSE 187* 134* 206* 196*  BUN 54* 46* 33* 27*  CREATININE 2.02* 1.34* 1.04 0.94  CALCIUM 10.8* 9.8 9.3 9.9   Liver Function Tests:  Recent Labs Lab 03/23/14 1705 03/24/14 0440  AST 39* 32  ALT 29 25  ALKPHOS 38* 28*  BILITOT 0.2* 0.4  PROT 6.5 6.2  ALBUMIN 4.1 3.7   No results for input(s): LIPASE, AMYLASE in  the last 168 hours. No results for input(s): AMMONIA in the last 168 hours. CBC:  Recent Labs Lab 03/23/14 1705 03/24/14 0440 03/25/14 0356 03/26/14 0520  WBC 9.2 8.0 8.0 8.0  NEUTROABS 5.9 4.0  --   --   HGB 13.7 13.0 13.2 13.9  HCT 41.6 40.4 41.3 43.1  MCV 86.7 87.3 86.2 86.9  PLT 201 162 175 168   Cardiac Enzymes:  Recent Labs Lab 03/23/14 1705 03/23/14 2314 03/24/14 0440 03/24/14 0800  TROPONINI <0.03 <0.03 <0.03 <0.03   BNP: BNP (last 3 results)  Recent Labs  09/13/13 2350  PROBNP 115.2   CBG:  Recent  Labs Lab 03/25/14 0734 03/25/14 2038 03/26/14 1147 03/26/14 1632 03/26/14 2110  GLUCAP 152* 307* 209* 239* 324*       Signed:  Charlynne Cousins  [facility] 03/27/2014, 10:57 AM

## 2014-04-10 ENCOUNTER — Encounter: Payer: Medicare Other | Admitting: Cardiology

## 2014-04-21 ENCOUNTER — Encounter: Payer: Medicare Other | Admitting: Cardiology

## 2014-04-27 ENCOUNTER — Telehealth: Payer: Self-pay | Admitting: Pulmonary Disease

## 2014-04-27 ENCOUNTER — Telehealth: Payer: Self-pay | Admitting: Podiatry

## 2014-04-27 NOTE — Telephone Encounter (Signed)
Patient son came in to check the status of his mother's diabetic shoes. Please advise.

## 2014-04-28 NOTE — Telephone Encounter (Signed)
lmomtcb x1 for rick Are they wanting [facility] to order a new BIPAP machine? Sleep studies were dropped off by [employer]. Will await call back for placing this in [facility] look at

## 2014-04-28 NOTE — Telephone Encounter (Signed)
Spoke with patient, patient confirmed he does want order for a new BiPAP machine.  Will route message to [place] so she is aware and can place papers in [facility] look at.

## 2014-04-28 NOTE — Telephone Encounter (Signed)
Please advise Kara Ortega thanks Also see 11/16/13 phone note

## 2014-04-28 NOTE — Telephone Encounter (Signed)
Rick returned call- [phone]

## 2014-05-01 ENCOUNTER — Encounter: Payer: Self-pay | Admitting: Pulmonary Disease

## 2014-05-01 ENCOUNTER — Other Ambulatory Visit: Payer: Self-pay | Admitting: Pulmonary Disease

## 2014-05-01 DIAGNOSIS — G4733 Obstructive sleep apnea (adult) (pediatric): Secondary | ICD-10-CM

## 2014-05-01 NOTE — Telephone Encounter (Signed)
Mindy, I am ok with ordering new machine for her, but she needs a sleep evaluation with me soon.  Have never seen her for sleep apnea.

## 2014-05-01 NOTE — Telephone Encounter (Signed)
lmtcb x1 

## 2014-05-03 NOTE — Telephone Encounter (Signed)
Called and spoke to pt's son, Delfino Lovett. Appt made with [facility] on 06/09/2014 at 4pm. Order already placed. Richard verbalized understanding and denied any further questions or concerns at this time,

## 2014-05-19 ENCOUNTER — Encounter: Payer: Self-pay | Admitting: Cardiology

## 2014-05-19 ENCOUNTER — Ambulatory Visit (INDEPENDENT_AMBULATORY_CARE_PROVIDER_SITE_OTHER): Payer: Medicare Other | Admitting: Cardiology

## 2014-05-19 VITALS — BP 110/58 | HR 71 | Ht 61.0 in | Wt 231.0 lb

## 2014-05-19 DIAGNOSIS — E7800 Pure hypercholesterolemia, unspecified: Secondary | ICD-10-CM

## 2014-05-19 DIAGNOSIS — I4819 Other persistent atrial fibrillation: Secondary | ICD-10-CM

## 2014-05-19 DIAGNOSIS — Z79899 Other long term (current) drug therapy: Secondary | ICD-10-CM

## 2014-05-19 DIAGNOSIS — I481 Persistent atrial fibrillation: Secondary | ICD-10-CM

## 2014-05-19 LAB — LDL CHOLESTEROL, DIRECT: LDL DIRECT: 88 mg/dL

## 2014-05-19 LAB — LIPID PANEL
CHOL/HDL RATIO: 4
Cholesterol: 169 mg/dL (ref 0–200)
HDL: 39.1 mg/dL (ref >39.00)
Triglycerides: 555 mg/dL — ABNORMAL HIGH (ref 0.0–149.0)

## 2014-05-19 MED ORDER — APIXABAN 5 MG PO TABS: 5.0000 mg | ORAL_TABLET | Freq: Two times a day (BID) | ORAL | Status: DC

## 2014-05-19 MED ORDER — NITROGLYCERIN 0.4 MG SL SUBL: 0.4000 mg | SUBLINGUAL_TABLET | SUBLINGUAL | Status: AC | PRN

## 2014-05-19 NOTE — Patient Instructions (Signed)
Please stop your ASA, warfarin, Welchol and Fenofibrate. Once you have been off ASA and Warfarin for 3 days (on the fourth day) start Eliquis 5 mg twice a day. Continue all other medications as listed.  Please have blood work today (lipid panel)  Please have blood work in 6 months (lipid, CBC and BMP)  Follow up in 6 months with Dr. Marlou Porch.  You will receive a letter in the mail 2 months before you are due.  Please call us when you receive this letter to schedule your follow up appointment.  Thank you for choosing [facility]!!

## 2014-05-19 NOTE — Progress Notes (Signed)
[place]. [address], [address], [place]  [address] Phone: [phone] Fax:  [phone]  Date:  05/19/2014   ID:  [name], DOB 1930/01/15, MRN [id number]  PCP:  [name], MD   History of Present Illness: Kara Ortega is a 81 y.o. female  Who was hospitalized on 03/27/14 with acute kidney injury, atrial fibrillation , fall , weight gain, fluid overload mom a paroxysmal atrial fibrillation , Cardizem 240, Toprol 25. Preferred to stay on Coumadin. Discussion was noted about discontinuation but she refused and wanted to continue..  She had near syncope, fall and was bradycardic and hypotensive. No acute fractures. She also had moderately severe aortic stenosis and possible mitral stenosis.   we changed her over from Coumadin to Eliquis. Ease of use. Her son was present for discussion. Discussed bleeding risks. Continue to encourage conditioning. Aortic stenosis is currently moderate. We will continue to monitor.  Wt Readings from Last 3 Encounters:  05/19/14 231 lb (104.781 kg)  03/26/14 236 lb 6.4 oz (107.23 kg)  01/06/14 223 lb (101.152 kg)     Past Medical History  Diagnosis Date  . Abnormal heart rhythms   . Diabetes     insulin dependent  . Sleep apnea     uses bipap X12 years.  Marland Kitchen COPD (chronic obstructive pulmonary disease)   . CHF (congestive heart failure)   . A-fib   . Anginal pain   . Hypertension   . Stroke     LEFT SIDE RESIDUAL  . Shortness of breath   . Depression   . Chronic kidney disease     STAGE 3   . Memory loss   . Alzheimer disease   . Leg pain   . Back pain   . Neuropathy     Past Surgical History  Procedure Laterality Date  . Breast surgery      X4  . Abdominal surgery      removed 14 inches of colon  . Hernia repair    . Breast lumpectomy      Current Outpatient Prescriptions  Medication Sig Dispense Refill  . albuterol (PROVENTIL HFA;VENTOLIN HFA) 108 (90 BASE) MCG/ACT inhaler Inhale 1-2 puffs into the  lungs every 4 (four) hours as needed for wheezing or shortness of breath. 1 Inhaler 11  . allopurinol (ZYLOPRIM) 100 MG tablet Take 100 mg by mouth daily.    Marland Kitchen ALPRAZolam (XANAX) 0.25 MG tablet Take 0.25 mg by mouth 2 (two) times daily as needed for anxiety.    Marland Kitchen aspirin 81 MG chewable tablet Chew 81 mg by mouth daily.    Marland Kitchen atorvastatin (LIPITOR) 10 MG tablet Take 10 mg by mouth at bedtime.     Marland Kitchen b complex vitamins tablet Take 1 tablet by mouth daily.    . B Complex-C (B-COMPLEX WITH VITAMIN C) tablet Take 1 tablet by mouth daily.    . calcium carbonate (OS-CAL - DOSED IN MG OF ELEMENTAL CALCIUM) 1250 MG tablet Take 1 tablet by mouth daily with breakfast.    . celecoxib (CELEBREX) 200 MG capsule Take 200 mg by mouth daily.    . Cholecalciferol (HM VITAMIN D3) 4000 UNITS CAPS Take 12,000 Units by mouth daily.     . citalopram (CELEXA) 20 MG tablet Take 20 mg by mouth daily.    . colchicine 0.6 MG tablet Take 0.6 mg by mouth daily as needed (gout).    . colesevelam (WELCHOL) 625 MG tablet Take 1,875 mg by  mouth 2 (two) times daily with a meal.    . diclofenac sodium (VOLTAREN) 1 % GEL Apply 4 g topically 4 (four) times daily as needed (pain).     Marland Kitchen esomeprazole (NEXIUM) 40 MG capsule Take 40 mg by mouth daily.     . febuxostat (ULORIC) 40 MG tablet Take 40 mg by mouth daily.    . fenofibrate micronized (LOFIBRA) 134 MG capsule Take 134 mg by mouth daily before breakfast.    . furosemide (LASIX) 40 MG tablet Take 20 mg by mouth daily.     Marland Kitchen HYDROcodone-acetaminophen (NORCO/VICODIN) 5-325 MG per tablet Take 0.5 tablets by mouth every 6 (six) hours as needed for moderate pain.    Marland Kitchen insulin detemir (LEVEMIR) 100 UNIT/ML injection Inject 0.7 mLs (70 Units total) into the skin at bedtime. 10 mL 11  . isosorbide mononitrate (IMDUR) 30 MG 24 hr tablet Take 30 mg by mouth every 12 (twelve) hours.    . Liraglutide (VICTOZA) 18 MG/3ML SOPN Inject 1.2 mg into the skin every morning.    Marland Kitchen losartan (COZAAR) 25  MG tablet Take 25 mg by mouth daily.    . metoprolol succinate (TOPROL-XL) 25 MG 24 hr tablet Take 25 mg by mouth daily.    . nitroGLYCERIN (NITROSTAT) 0.4 MG SL tablet Place 0.4 mg under the tongue every 5 (five) minutes as needed for chest pain.     . pantoprazole (PROTONIX) 40 MG tablet Take 40 mg by mouth daily.    . potassium chloride (KLOR-CON) 20 MEQ packet Take 40 mEq by mouth daily.     . pregabalin (LYRICA) 75 MG capsule Take 75 mg by mouth 2 (two) times daily.    . rivastigmine (EXELON) 9.5 mg/24hr Place 9.5 mg onto the skin daily.    Marland Kitchen spironolactone (ALDACTONE) 25 MG tablet Take 25 mg by mouth daily.    Marland Kitchen warfarin (COUMADIN) 5 MG tablet Take 2.5-5 mg by mouth See admin instructions. 5 mg every day except on Tuesday, Thursday, Saturday patient takes 2.5 mg    . zolpidem (AMBIEN) 5 MG tablet Take 5 mg by mouth at bedtime as needed for sleep.     No current facility-administered medications for this visit.    Allergies:    Allergies  Allergen Reactions  . Avandia [Rosiglitazone] Other (See Comments)    edema  . Benazepril Other (See Comments)    unknown  . Dilaudid [Hydromorphone Hcl]     "does not tolerate strong pain medications" per pt  . Fosamax [Alendronate Sodium] Nausea And Vomiting  . Talwin [Pentazocine] Nausea And Vomiting  . Tetanus Toxoids     "allergic to something in the tetanus injection" per patient  . Zyrtec [Cetirizine] Other (See Comments)    unknown    Social History:  The patient  reports that she quit smoking about 46 years ago. Her smoking use included Cigarettes. She has a 27 pack-year smoking history. She has never used smokeless tobacco. She reports that she does not drink alcohol or use illicit drugs.   Family History  Problem Relation Age of Onset  . Heart disease Mother   . Heart disease Sister   . Dementia Sister     ROS:  Please see the history of present illness.    Denies any further syncopal episodes, no bleeding episodes , no chest  pain. Baseline shortness of breath/deconditioning noted. Baseline memory impairment.   All other systems reviewed and negative.   PHYSICAL EXAM: VS:  BP 110/58 mmHg  Pulse 71  Ht 5\' 1"  (1.549 m)  Wt 231 lb (104.781 kg)  BMI 43.67 kg/m2 Well nourished, well developed, in no acute distress , in wheelchair HEENT: normal, Fries/AT, EOMI Neck: no JVD, normal carotid upstroke, no bruit Cardiac:  normal S1, S2; RRR;  2/6 systolic upper sternal border murmur Lungs:  clear to auscultation bilaterally, no wheezing, rhonchi or rales Abd: soft, nontender, no hepatomegaly, no bruitsobese Ext: no edema, 2+ distal pulses Skin: warm and dry GU: deferred Neuro: no focal abnormalities noted, AAO x 3  EKG:   None today     ASSESSMENT AND PLAN:  1.  paroxysmal atrial fibrillation-we will continue with anticoagulation however for ease of use we will change her from Coumadin over 2 Eliquis 5 mg twice a day. She will stop her Coumadin and then the fourth day start Eliquis 5 mg twice a day. Watch for any signs of bleeding. She has a several year, remote history of GI bleed. This was during the golf war. Eliquis will help simplify her anticoagulation regimen. She understands the bleeding risks involved. She does utilize a walker and she is at a fall risk. We will continue to monitor her CBC, basic metabolic profile every 6 months. 2.  Hyperlipidemia-continue with atorvastatin 10 mg. I will stop her fibrate  And WelChol.  I will check her lipid profile today. We will compare this in 6 months. 3.  Obesity-encourage weight loss. 4.  Diabetes-per Dr. Tamala Julian. 5.  Chronic anticoagulation- transitioning from Coumadin to Eliquis. 6.  Prior syncope -fall in the shower after she took nitroglycerin. She occasionally will have sharp fleeting chest pain that sometimes wakes her up from sleep. I instructed her that this is likely musculoskeletal and noncardiac and she should use her nitroglycerin sparingly. 7.  aortic stenosis -  moderate , we will continue to monitor. 8.  See back in 6 months  Signed, Candee Furbish, MD [facility]  05/19/2014 2:41 PM

## 2014-06-09 ENCOUNTER — Institutional Professional Consult (permissible substitution): Payer: Self-pay | Admitting: Pulmonary Disease

## 2014-06-30 ENCOUNTER — Ambulatory Visit: Payer: Medicare Other | Admitting: Neurology

## 2014-07-14 ENCOUNTER — Encounter: Payer: Self-pay | Admitting: Pulmonary Disease

## 2014-07-14 ENCOUNTER — Ambulatory Visit (INDEPENDENT_AMBULATORY_CARE_PROVIDER_SITE_OTHER): Payer: Medicare Other | Admitting: Pulmonary Disease

## 2014-07-14 VITALS — BP 110/62 | HR 76 | Temp 97.6°F | Ht 61.0 in | Wt 228.4 lb

## 2014-07-14 DIAGNOSIS — G4733 Obstructive sleep apnea (adult) (pediatric): Secondary | ICD-10-CM | POA: Diagnosis not present

## 2014-07-14 NOTE — Progress Notes (Signed)
Subjective:    Patient ID: Kara Ortega, female    DOB: [birth year], 81 y.o.   MRN: [id number]   HPI  The pt comes in today to establish for management of OSA.  I have seen her for dyspnea on exertion in the past, but have never seen her for management of sleep apnea. She was diagnosed in 2004 with moderate to severe sleep apnea.  She was ultimately treated with bilevel after failing cpap, and had titration study in 2011 with optimal pressure of 13/9.  She has been on that since 2011 and has done very well, but most recently her machine needed to be replaced. She is now on a new bilevel device with the same pressure, and feels that she is doing very well. She is using nasal pillows with heated humidification, and feels that she sleeps very well. She uses her device with sleep and also naps, and her downloaded shows great compliance with an average usage time of 11-1/2 hours a day. She has good control of her AHI, and no overly significant mask leaks.  Sleep Questionnaire What time do you typically go to bed?( Between what hours) 10:30p 10:30p at 1333 on 07/14/14 by Inge Rise, [place] How long does it take you to fall asleep? couple hours couple hours at 1333 on 07/14/14 by Inge Rise, CMA How many times during the night do you wake up? 1 1 at 1333 on 07/14/14 by Inge Rise, CMA What time do you get out of bed to start your day? 1000 1000 at 1333 on 07/14/14 by Inge Rise, CMA Do you drive or operate heavy machinery in your occupation? No No at 1333 on 07/14/14 by Inge Rise, CMA How much has your weight changed (up or down) over the past two years? (In pounds) 0 oz (0 kg) 0 oz (0 kg) at 1333 on 07/14/14 by Inge Rise, CMA Have you ever had a sleep study before? Yes Yes at 1333 on 07/14/14 by Inge Rise, CMA If yes, location of study? [facility] [place] on 07/14/14 by Inge Rise, CMA If yes, date of study? 2011 2011 at 1333 on 07/14/14 by Inge Rise,  CMA Do you currently use CPAP? Yes Yes at 1333 on 07/14/14 by Inge Rise, CMA If so, what pressure? BIPAP 13/9 BIPAP 13/9 at 1333 on 07/14/14 by Inge Rise, CMA Do you wear oxygen at any time? No No at 1333 on 07/14/14 by Inge Rise, CMA   Review of Systems  Constitutional: Negative for fever and unexpected weight change.  HENT: Negative for congestion, dental problem, ear pain, nosebleeds, postnasal drip, rhinorrhea, sinus pressure, sneezing, sore throat and trouble swallowing.   Eyes: Negative for redness and itching.  Respiratory: Positive for shortness of breath. Negative for cough, chest tightness and wheezing.   Cardiovascular: Positive for leg swelling. Negative for palpitations.  Gastrointestinal: Negative for nausea and vomiting.  Genitourinary: Negative for dysuria.  Musculoskeletal: Negative for joint swelling.  Skin: Negative for rash.  Neurological: Negative for headaches.  Hematological: Does not bruise/bleed easily.  Psychiatric/Behavioral: Positive for dysphoric mood. The patient is nervous/anxious.        Objective:   Physical Exam Morbidly obese female in no acute distress Nose without purulence or discharge noted Neck without lymphadenopathy or thyromegaly No skin breakdown or pressure necrosis from the C Pap mask Lower extremities with edema noted, no cyanosis Alert and oriented, moves all 4 extremities.  Assessment & Plan:

## 2014-07-14 NOTE — Patient Instructions (Signed)
Continue on your bipap device, and keep up with mask changes and supplies. Work on weight loss followup again in one year, but call if having issues with your bipap.

## 2014-07-14 NOTE — Assessment & Plan Note (Signed)
The pt is doing very well on her current bilevel device.  She is using nasal pillows, and has no significant mask leak and good control of AHI .  I have asked her to continue on her device, and to keep up with her equipment changes. Will see her back in one year if she is doing well.

## 2014-07-21 ENCOUNTER — Ambulatory Visit (INDEPENDENT_AMBULATORY_CARE_PROVIDER_SITE_OTHER): Payer: Medicare Other | Admitting: Podiatry

## 2014-07-21 DIAGNOSIS — M79676 Pain in unspecified toe(s): Secondary | ICD-10-CM

## 2014-07-21 DIAGNOSIS — E1140 Type 2 diabetes mellitus with diabetic neuropathy, unspecified: Secondary | ICD-10-CM | POA: Diagnosis not present

## 2014-07-21 DIAGNOSIS — B351 Tinea unguium: Secondary | ICD-10-CM | POA: Diagnosis not present

## 2014-07-21 DIAGNOSIS — E1149 Type 2 diabetes mellitus with other diabetic neurological complication: Secondary | ICD-10-CM

## 2014-07-21 NOTE — Patient Instructions (Signed)
Diabetic Neuropathy Diabetic neuropathy is a nerve disease or nerve damage that is caused by diabetes mellitus. About half of all people with diabetes mellitus have some form of nerve damage. Nerve damage is more common in those who have had diabetes mellitus for many years and who generally have not had good control of their blood sugar (glucose) level. Diabetic neuropathy is a common complication of diabetes mellitus. There are three more common types of diabetic neuropathy and a fourth type that is less common and less understood:   Peripheral neuropathy--This is the most common type of diabetic neuropathy. It causes damage to the nerves of the feet and legs first and then eventually the hands and arms.The damage affects the ability to sense touch.  Autonomic neuropathy--This type causes damage to the autonomic nervous system, which controls the following functions:  Heartbeat.  Body temperature.  Blood pressure.  Urination.  Digestion.  Sweating.  Sexual function.  Focal neuropathy--Focal neuropathy can be painful and unpredictable and occurs most often in older adults with diabetes mellitus. It involves a specific nerve or one area and often comes on suddenly. It usually does not cause long-term problems.  Radiculoplexus neuropathy-- Sometimes called lumbosacral radiculoplexus neuropathy, radiculoplexus neuropathy affects the nerves of the thighs, hips, buttocks, or legs. It is more common in people with type 2 diabetes mellitus and in older men. It is characterized by debilitating pain, weakness, and atrophy, usually in the thigh muscles. CAUSES  The cause of peripheral, autonomic, and focal neuropathies is diabetes mellitus that is uncontrolled and high glucose levels. The cause of radiculoplexus neuropathy is unknown. However, it is thought to be caused by inflammation related to uncontrolled glucose levels. SIGNS AND SYMPTOMS  Peripheral Neuropathy Peripheral neuropathy develops  slowly over time. When the nerves of the feet and legs no longer work there may be:   Burning, stabbing, or aching pain in the legs or feet.  Inability to feel pressure or pain in your feet. This can lead to:  Thick calluses over pressure areas.  Pressure sores.  Ulcers.  Foot deformities.  Reduced ability to feel temperature changes.  Muscle weakness. Autonomic Neuropathy The symptoms of autonomic neuropathy vary depending on which nerves are affected. Symptoms may include:  Problems with digestion, such as:  Feeling sick to your stomach (nausea).  Vomiting.  Bloating.  Constipation.  Diarrhea.  Abdominal pain.  Difficulty with urination. This occurs if you lose your ability to sense when your bladder is full. Problems include:  Urine leakage (incontinence).  Inability to empty your bladder completely (retention).  Rapid or irregular heartbeat (palpitations).  Blood pressure drops when you stand up (orthostatic hypotension). When you stand up you may feel:  Dizzy.  Weak.  Faint.  In men, inability to attain and maintain an erection.  In women, vaginal dryness and problems with decreased sexual desire and arousal.  Problems with body temperature regulation.  Increased or decreased sweating. Focal Neuropathy  Abnormal eye movements or abnormal alignment of both eyes.  Weakness in the wrist.  Foot drop. This results in an inability to lift the foot properly and abnormal walking or foot movement.  Paralysis on one side of your face (Bell palsy).  Chest or abdominal pain. Radiculoplexus Neuropathy  Sudden, severe pain in your hip, thigh, or buttocks.  Weakness and wasting of thigh muscles.  Difficulty rising from a seated position.  Abdominal swelling.  Unexplained weight loss (usually more than 10 lb [4.5 kg]). DIAGNOSIS  Peripheral Neuropathy Your senses may   be tested. Sensory function testing can be done with:  A light touch using a  monofilament.  A vibration with tuning fork.  A sharp sensation with a pin prick. Other tests that can help diagnose neuropathy are:  Nerve conduction velocity. This test checks the transmission of an electrical current through a nerve.  Electromyography. This shows how muscles respond to electrical signals transmitted by nearby nerves.  Quantitative sensory testing. This is used to assess how your nerves respond to vibrations and changes in temperature. Autonomic Neuropathy Diagnosis is often based on reported symptoms. Tell your health care provider if you experience:   Dizziness.   Constipation.   Diarrhea.   Inappropriate urination or inability to urinate.   Inability to get or maintain an erection.  Tests that may be done include:   Electrocardiography or Holter monitor. These are tests that can help show problems with the heart rate or heart rhythm.   An X-ray exam may be done. Focal Neuropathy Diagnosis is made based on your symptoms and what your health care provider finds during your exam. Other tests may be done. They may include:  Nerve conduction velocities. This checks the transmission of electrical current through a nerve.  Electromyography. This shows how muscles respond to electrical signals transmitted by nearby nerves.  Quantitative sensory testing. This test is used to assess how your nerves respond to vibration and changes in temperature. Radiculoplexus Neuropathy  Often the first thing is to eliminate any other issue or problems that might be the cause, as there is no stick test for diagnosis.  X-ray exam of your spine and lumbar region.  Spinal tap to rule out cancer.  MRI to rule out other lesions. TREATMENT  Once nerve damage occurs, it cannot be reversed. The goal of treatment is to keep the disease or nerve damage from getting worse and affecting more nerve fibers. Controlling your blood glucose level is the key. Most people with  radiculoplexus neuropathy see at least a partial improvement over time. You will need to keep your blood glucose and HbA1c levels in the target range determined by your health care provider. Things that help control blood glucose levels include:   Blood glucose monitoring.   Meal planning.   Physical activity.   Diabetes medicine.  Over time, maintaining lower blood glucose levels helps lessen symptoms. Sometimes, prescription pain medicine is needed. HOME CARE INSTRUCTIONS:  Do not smoke.  Keep your blood glucose level in the range that you and your health care provider have determined acceptable for you.  Keep your blood pressure level in the range that you and your health care provider have determined acceptable for you.  Eat a well-balanced diet.  Be active every day.  Check your feet every day. SEEK MEDICAL CARE IF:   You have burning, stabbing, or aching pain in the legs or feet.  You are unable to feel pressure or pain in your feet.  You develop problems with digestion such as:  Nausea.  Vomiting.  Bloating.  Constipation.  Diarrhea.  Abdominal pain.  You have difficulty with urination, such as:  Incontinence.  Retention.  You have palpitations.  You develop orthostatic hypotension. When you stand up you may feel:  Dizzy.  Weak.  Faint.  You cannot attain and maintain an erection (in men).  You have vaginal dryness and problems with decreased sexual desire and arousal (in women).  You have severe pain in your thighs, legs, or buttocks.  You have unexplained weight loss.   Document Released: 05/26/2001 Document Revised: 01/05/2013 Document Reviewed: 08/26/2012 [facility] Patient Information 2015 [place], [place]. This information is not intended to replace advice given to you by your health care provider. Make sure you discuss any questions you have with your health care provider. Diabetes and Foot Care Diabetes may cause you to have problems  because of poor blood supply (circulation) to your feet and legs. This may cause the skin on your feet to become thinner, break easier, and heal more slowly. Your skin may become dry, and the skin may peel and crack. You may also have nerve damage in your legs and feet causing decreased feeling in them. You may not notice minor injuries to your feet that could lead to infections or more serious problems. Taking care of your feet is one of the most important things you can do for yourself.  HOME CARE INSTRUCTIONS  Wear shoes at all times, even in the house. Do not go barefoot. Bare feet are easily injured.  Check your feet daily for blisters, cuts, and redness. If you cannot see the bottom of your feet, use a mirror or ask someone for help.  Wash your feet with warm water (do not use hot water) and mild soap. Then pat your feet and the areas between your toes until they are completely dry. Do not soak your feet as this can dry your skin.  Apply a moisturizing lotion or petroleum jelly (that does not contain alcohol and is unscented) to the skin on your feet and to dry, brittle toenails. Do not apply lotion between your toes.  Trim your toenails straight across. Do not dig under them or around the cuticle. File the edges of your nails with an emery board or nail file.  Do not cut corns or calluses or try to remove them with medicine.  Wear clean socks or stockings every day. Make sure they are not too tight. Do not wear knee-high stockings since they may decrease blood flow to your legs.  Wear shoes that fit properly and have enough cushioning. To break in new shoes, wear them for just a few hours a day. This prevents you from injuring your feet. Always look in your shoes before you put them on to be sure there are no objects inside.  Do not cross your legs. This may decrease the blood flow to your feet.  If you find a minor scrape, cut, or break in the skin on your feet, keep it and the skin  around it clean and dry. These areas may be cleansed with mild soap and water. Do not cleanse the area with peroxide, alcohol, or iodine.  When you remove an adhesive bandage, be sure not to damage the skin around it.  If you have a wound, look at it several times a day to make sure it is healing.  Do not use heating pads or hot water bottles. They may burn your skin. If you have lost feeling in your feet or legs, you may not know it is happening until it is too late.  Make sure your health care provider performs a complete foot exam at least annually or more often if you have foot problems. Report any cuts, sores, or bruises to your health care provider immediately. SEEK MEDICAL CARE IF:   You have an injury that is not healing.  You have cuts or breaks in the skin.  You have an ingrown nail.  You notice redness on your legs or feet.  You feel burning or tingling in your legs or feet.  You have pain or cramps in your legs and feet.  Your legs or feet are numb.  Your feet always feel cold. SEEK IMMEDIATE MEDICAL CARE IF:   There is increasing redness, swelling, or pain in or around a wound.  There is a red line that goes up your leg.  Pus is coming from a wound.  You develop a fever or as directed by your health care provider.  You notice a bad smell coming from an ulcer or wound. Document Released: 03/14/2000 Document Revised: 11/17/2012 Document Reviewed: 08/24/2012 [facility] Patient Information 2015 [place], [place]. This information is not intended to replace advice given to you by your health care provider. Make sure you discuss any questions you have with your health care provider.

## 2014-07-22 NOTE — Progress Notes (Signed)
Patient ID: Kara Ortega, female   DOB: [birth year], 81 y.o.   MRN: [id number]  Subjective: 81 year old female presents the office they for diabetic risk assessment and for diabetic shoe evaluation. She also states her nails are painful, elongated and she is unable to trim them herself. Denies any surrounding erythema or drainage. Denies any recent injury or trauma. No other complaints at this time.  Objective: AAO 3, NAD DP/PT pulses palpable, CRT less than 3 seconds Protective sensation decreased with Sims Weinstein monofilament, decreased vibratory sensation, Achilles tendon reflex intact. Nails are hypertrophic, dystrophic, elongated, discolored 8. The nails and the left hallux and second digit Previously been removed. There subjective tenderness overlying 8 toenails. No swelling erythema or drainage line nail sites. No open lesions or pre-ulcer lesions identified bilaterally. Hammertoe contractures identified bilaterally. There is hallux limitus of the left foot. No other areas of tenderness to bilateral lower x-rays. No overlying edema, erythema, increase in warmth. No pain with calf compression, swelling, warmth, erythema.  Assessment: 81 year old female with symptoms onychomycosis 8, neuropathy  Plan: -Treatment options were discussed include alternatives, risks, complications -Nail sharply debrided 8 without complications/bleeding -Paperwork for diabetic shoes was completed this time. The patient is also measured for shoes at today's appointment. -Discussed importance of daily foot inspection. -Follow-up in 3 months or sooner if any problems are to arise. Call with questions or concerns.

## 2014-09-14 ENCOUNTER — Encounter: Payer: Self-pay | Admitting: Neurology

## 2014-09-14 ENCOUNTER — Ambulatory Visit (INDEPENDENT_AMBULATORY_CARE_PROVIDER_SITE_OTHER): Payer: Medicare Other | Admitting: Neurology

## 2014-09-14 VITALS — BP 112/66 | HR 66 | Resp 18 | Ht 61.0 in | Wt 226.8 lb

## 2014-09-14 DIAGNOSIS — G309 Alzheimer's disease, unspecified: Secondary | ICD-10-CM

## 2014-09-14 DIAGNOSIS — F0280 Dementia in other diseases classified elsewhere without behavioral disturbance: Secondary | ICD-10-CM | POA: Diagnosis not present

## 2014-09-14 DIAGNOSIS — G250 Essential tremor: Secondary | ICD-10-CM | POA: Diagnosis not present

## 2014-09-14 MED ORDER — PRIMIDONE 50 MG PO TABS: 25.0000 mg | ORAL_TABLET | Freq: Every day | ORAL | Status: DC

## 2014-09-14 NOTE — Patient Instructions (Addendum)
1.  For tremor, we will start primidone 25mg  at bedtime.  Call in 1-2 weeks with update.  Be aware, that primidone may interact with the Eliquis, but in the past cardiologists have told us no more than 50mg  is okay.  We will let your cardiologist know 2.  Follow up in 3 months.

## 2014-09-14 NOTE — Progress Notes (Signed)
NEUROLOGY FOLLOW UP OFFICE NOTE  Kara Ortega [id number]  HISTORY OF PRESENT ILLNESS: Kara Ortega is an 81 year old right-handed woman with history of a-fib (on warfarin), CHF, unstable angina, hypertension, fibromyalgia, sleep apnea, basal cell carcinoma, benign essential tremor, hypothyroidism, hyperlipidemia, and type II diabetes with neuropathy who follows up for Alzheimer's dementia.  Labs, CT of head and hospital notes reviewed.  She is accompanied by her daughter-in-law who provides some history.  UPDATE: She is taking Exelon 9.5mg  patch.  She is doing well.  Since last visit, she was hospitalized in December for a fall after slipping coming out of the shower.  It was thought that the heat of the shower and having taken Nitro precipitated it.  CT of head showed atrophy and mild chronic ischemic changes, but nothing acute.  She was found to have acute kidney injury that resolved with hydration.  Since then, she has used a rolling walker and she has not had any other falls.  She does have a seat and handle bars in the shower.  However, she says she may slide off the seat due to it being slippery.  Despite the pillbox, she will still occasionally forget to take medication.  She is no longer driving.  She has history of tremor for the past 5 years or so.  However, it has been worse lately.  It involves both hands and it makes it difficult for her to write, type or use utensils.  She denies any family history of tremor, but a cousin has Parkinson's disease.    Labs from September include B12 732 and TSH of 2.029.  Hgb A1c from December was 8.9.  HISTORY: She began having memory problems 4.5 years ago, while living in [place], [place], however it has more noticeably progressed over the past 2 years.  It started with short-term memory problems.  She will forget content of conversation.  She would repeat herself frequently.  She frequently repeats questions to reconfirm things.  She requires  help with administering her medications.  Her children set up her medications for the week in a pillbox.  She usually takes it correctly, but infrequently, she will miss a dose, usually because she is confused about the day of the week, even though it is written on the box.  She has problems with math and gets confused calculating the amount of insulin she uses.  She has left the stove on in the past.  She usually has meals prepared by her children or she gets [place] take-out.  She will use the stove occasionally to cook soup.  She now finds that she will forget names of acquaintances, as well as some family such as her sister.  She seldom drives, usually only to the [employer] which is behind her house.  [place] [facility]2 a mile away.  She eats breakfast, but will typically graze the rest of the day.  She is able to perform her ADLs and keeps the house clean and does the laundry.  She does have difficulty with some simple tasks, such as operating the washer and dryer.  She no longer handles her finances or pays the bills (her son does it).  She lives by herself.  In the past, she used to see people in the house who weren't there, but this hasn't happened for at least a year.  She does not exhibit delusions.  She denies depression.  She does have difficulty sleeping at times.  She goes to bed around midnight and wakes up about 1.5 hours later.  She will keep herself occupied by cooking soup until about 4 am.  She will watch TV and drift in and out of sleep until getting up for the day around 11am.  She often spends most of the day sitting and watching TV.  She takes Exelon 9.5mg  patch.  Her sister has Alzheimer's.  Her maternal grandmother and aunt had dementia.  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  She has a master's degree in administration and  supervision.  She also has recurrent falls.  He has had gait instability for many years due to imbalance.  Over the past year, she has had some falls.  Often she says her left knee gives out.  She also reports pain in the left knee.  She has some known mild weakness in the left leg and was told by her former neurologist that she may have had a small stroke.  PAST MEDICAL HISTORY: Past Medical History  Diagnosis Date  . Abnormal heart rhythms   . Diabetes     insulin dependent  . Sleep apnea     uses bipap X12 years.  Marland Kitchen COPD (chronic obstructive pulmonary disease)   . CHF (congestive heart failure)   . A-fib   . Anginal pain   . Hypertension   . Stroke     LEFT SIDE RESIDUAL  . Shortness of breath   . Depression   . Chronic kidney disease     STAGE 3   . Memory loss   . Alzheimer disease   . Leg pain   . Back pain   . Neuropathy     MEDICATIONS: Current Outpatient Prescriptions on File Prior to Visit  Medication Sig Dispense Refill  . ALPRAZolam (XANAX) 0.25 MG tablet 1-2 tabs twice daily    . apixaban (ELIQUIS) 5 MG TABS tablet Take 1 tablet (5 mg total) by mouth 2 (two) times daily. 60 tablet 6  . atorvastatin (LIPITOR) 10 MG tablet Take 10 mg by mouth at bedtime.     Marland Kitchen b complex vitamins tablet Take 1 tablet by mouth daily.    . Cholecalciferol (HM VITAMIN D3) 4000 UNITS CAPS Take 4,000 Units by mouth daily.     . citalopram (CELEXA) 20 MG tablet Take 20 mg by mouth daily.    . colchicine 0.6 MG tablet Take 0.6 mg by mouth daily as needed (gout).    Marland Kitchen diclofenac sodium (VOLTAREN) 1 % GEL Apply 4 g topically 4 (four) times daily as needed (pain).     Marland Kitchen diltiazem (TIAZAC) 120 MG 24 hr capsule Take 120 mg by mouth as needed.    . diltiazem (TIAZAC) 240 MG 24 hr capsule Take 240 mg by mouth daily.    . febuxostat (ULORIC) 40 MG tablet Take 40 mg by mouth daily.    . fenofibrate micronized (LOFIBRA) 134 MG capsule Take 134 mg by mouth daily.    . furosemide (LASIX) 40 MG  tablet Take 20 mg by mouth daily.     . isosorbide mononitrate (IMDUR) 30 MG 24 hr tablet Take 30 mg by mouth every 12 (twelve) hours.    . Liraglutide (VICTOZA) 18 MG/3ML SOPN 1.2 mg once daily    . losartan (COZAAR) 25 MG tablet Take 25 mg by mouth daily.    Marland Kitchen  metoprolol succinate (TOPROL-XL) 25 MG 24 hr tablet Take 25 mg by mouth daily.    . Multiple Vitamins-Minerals (MULTIVITAMIN WITH MINERALS) tablet Take 1 tablet by mouth daily.    . nitroGLYCERIN (NITROSTAT) 0.4 MG SL tablet Place 1 tablet (0.4 mg total) under the tongue every 5 (five) minutes as needed for chest pain. 25 tablet prn  . pantoprazole (PROTONIX) 40 MG tablet Take 40 mg by mouth daily.    . potassium chloride (KLOR-CON) 20 MEQ packet Take 40 mEq by mouth daily.     . pregabalin (LYRICA) 75 MG capsule Take 75 mg by mouth 2 (two) times daily.    . rivastigmine (EXELON) 9.5 mg/24hr Place 9.5 mg onto the skin daily.    Marland Kitchen spironolactone (ALDACTONE) 25 MG tablet Take 25 mg by mouth daily.    Nelva Nay SOLOSTAR 300 UNIT/ML SOPN 70 units at bedtime  5  . traMADol (ULTRAM) 50 MG tablet Take 50 mg by mouth every 6 (six) hours as needed.    . zolpidem (AMBIEN) 5 MG tablet Take 5 mg by mouth at bedtime as needed for sleep.     No current facility-administered medications on file prior to visit.    ALLERGIES: Allergies  Allergen Reactions  . Avandia [Rosiglitazone] Other (See Comments)    edema  . Benazepril Other (See Comments)    unknown  . Dilaudid [Hydromorphone Hcl]     "does not tolerate strong pain medications" per pt  . Fosamax [Alendronate Sodium] Nausea And Vomiting  . Talwin [Pentazocine] Nausea And Vomiting  . Tetanus Toxoids     "allergic to something in the tetanus injection" per patient  . Zyrtec [Cetirizine] Other (See Comments)    unknown    FAMILY HISTORY: Family History  Problem Relation Age of Onset  . Heart disease Mother   . Heart disease Sister   . Dementia Sister     SOCIAL HISTORY: History    Social History  . Marital Status: Single    Spouse Name: N/A  . Number of Children: 3  . Years of Education: N/A   Occupational History  . retired    Social History Main Topics  . Smoking status: Former Smoker -- 1.50 packs/day for 18 years    Types: Cigarettes    Quit date: 03/31/1968  . Smokeless tobacco: Never Used  . Alcohol Use: No  . Drug Use: No  . Sexual Activity: No   Other Topics Concern  . Not on file   Social History Narrative    REVIEW OF SYSTEMS: Constitutional: No fevers, chills, or sweats, no generalized fatigue, change in appetite Eyes: No visual changes, double vision, eye pain Ear, nose and throat: No hearing loss, ear pain, nasal congestion, sore throat Cardiovascular: No chest pain, palpitations Respiratory:  No shortness of breath at rest or with exertion, wheezes GastrointestinaI: No nausea, vomiting, diarrhea, abdominal pain, fecal incontinence Genitourinary:  No dysuria, urinary retention or frequency Musculoskeletal:  No neck pain, back pain Integumentary: No rash, pruritus, skin lesions Neurological: as above Psychiatric: No depression, insomnia, anxiety Endocrine: No palpitations, fatigue, diaphoresis, mood swings, change in appetite, change in weight, increased thirst Hematologic/Lymphatic:  No anemia, purpura, petechiae. Allergic/Immunologic: no itchy/runny eyes, nasal congestion, recent allergic reactions, rashes  PHYSICAL EXAM: Filed Vitals:   09/14/14 0946  BP: 112/66  Pulse: 66  Resp: 18   General: No acute distress Head:  Normocephalic/atraumatic Eyes:  Fundoscopic exam unremarkable without vessel changes, exudates, hemorrhages or papilledema. Neck: supple, no paraspinal tenderness,  full range of motion Heart:  Regular rate and rhythm Lungs:  Clear to auscultation bilaterally Back: No paraspinal tenderness Neurological Exam: alert and oriented to person, place, and time. Attention span and concentration intact, delayed  recall poor, remote memory intact, fund of knowledge intact.  Speech fluent and not dysarthric, language intact.   Montreal Cognitive Assessment  09/14/2014 12/26/2013  Visuospatial/ Executive (0/5) 4 2  Naming (0/3) 3 2  Attention: Read list of digits (0/2) 2 2  Attention: Read list of letters (0/1) 1 1  Attention: Serial 7 subtraction starting at 100 (0/3) 2 3  Language: Repeat phrase (0/2) 2 1  Language : Fluency (0/1) 0 0  Abstraction (0/2) 2 1  Delayed Recall (0/5) 0 0  Orientation (0/6) 6 6  Total 22 18  Adjusted Score (based on education) 22 18   CN II-XII intact. Fundoscopic exam unremarkable without vessel changes, exudates, hemorrhages or papilledema.  Bulk and tone normal, muscle strength 5/5 throughout.  Postural and kinetic tremor noted bilaterally.  No rigidity or bradykinesia.  Sensation to light touch, temperature and vibration intact.  Deep tendon reflexes 1+ throughout, toes downgoing.  Finger to nose testing without dysmetria.  Mildly wide-based gait with some unsteadiness.  No shuffling. Unable to tandem walk. Romberg with sway.  IMPRESSION: Alzheimer's dementia, early stage Probable essential tremor.  She exhibits no symptoms of Parkinson's  PLAN: 1.  Continue Exelon 9.5mg  patch 2.  Consider getting a mat with a grip for the shower seat, to help prevent slipping. 3.  Will start primidone 25mg  at bedtime.  She is on Eliquis.  In the past, cardiologists have not had a problem with taking primidone at doses no larger than 50mg .  We will route note to her cardiologist to let him know. 4.  Follow up in 3 months.  40 minutes spent with patient face to face, over 50% spent discussing management.  Metta Clines, DO  CC:  Carol Ada, MD  Candee Furbish, MD

## 2014-09-29 ENCOUNTER — Ambulatory Visit (INDEPENDENT_AMBULATORY_CARE_PROVIDER_SITE_OTHER): Payer: Medicare Other | Admitting: Podiatry

## 2014-09-29 DIAGNOSIS — M2021 Hallux rigidus, right foot: Secondary | ICD-10-CM | POA: Diagnosis not present

## 2014-09-29 DIAGNOSIS — E1149 Type 2 diabetes mellitus with other diabetic neurological complication: Secondary | ICD-10-CM

## 2014-09-29 DIAGNOSIS — M2042 Other hammer toe(s) (acquired), left foot: Secondary | ICD-10-CM

## 2014-09-29 DIAGNOSIS — E1140 Type 2 diabetes mellitus with diabetic neuropathy, unspecified: Secondary | ICD-10-CM

## 2014-09-29 DIAGNOSIS — M2041 Other hammer toe(s) (acquired), right foot: Secondary | ICD-10-CM | POA: Diagnosis not present

## 2014-09-29 DIAGNOSIS — M2022 Hallux rigidus, left foot: Secondary | ICD-10-CM | POA: Diagnosis not present

## 2014-09-29 NOTE — Patient Instructions (Signed)

## 2014-10-03 NOTE — Progress Notes (Signed)
Patient ID: Kara Ortega, female   DOB: 1929/04/09, 81 y.o.   MRN: [id number]  Patient presents to pick up diabetic shoes. The shoes were fitted and she tried them on. They appeared to be fitting well without any high pressure areas. Discussed break in instructions. If there are any high pressure areas or problems with the shoes to stop wearing them and all the office. She had no new complaints today.  Follow-up as scheduled or sooner should any problems arise.  Celesta Gentile, DPM

## 2014-10-13 ENCOUNTER — Ambulatory Visit: Payer: Medicare Other | Admitting: Neurology

## 2014-10-20 ENCOUNTER — Ambulatory Visit: Payer: Medicare Other | Admitting: Podiatry

## 2014-11-26 ENCOUNTER — Other Ambulatory Visit: Payer: Self-pay | Admitting: Cardiology

## 2014-12-25 ENCOUNTER — Other Ambulatory Visit: Payer: Self-pay | Admitting: Cardiology

## 2014-12-29 ENCOUNTER — Encounter: Payer: Self-pay | Admitting: Neurology

## 2014-12-29 ENCOUNTER — Ambulatory Visit (INDEPENDENT_AMBULATORY_CARE_PROVIDER_SITE_OTHER): Payer: Medicare Other | Admitting: Neurology

## 2014-12-29 VITALS — BP 122/78 | HR 80 | Ht 61.0 in | Wt 230.0 lb

## 2014-12-29 DIAGNOSIS — G250 Essential tremor: Secondary | ICD-10-CM | POA: Diagnosis not present

## 2014-12-29 DIAGNOSIS — F0280 Dementia in other diseases classified elsewhere without behavioral disturbance: Secondary | ICD-10-CM

## 2014-12-29 DIAGNOSIS — G309 Alzheimer's disease, unspecified: Secondary | ICD-10-CM | POA: Diagnosis not present

## 2014-12-29 MED ORDER — RIVASTIGMINE 13.3 MG/24HR TD PT24: MEDICATED_PATCH | TRANSDERMAL | Status: DC

## 2014-12-29 NOTE — Patient Instructions (Signed)
1.  Increase Exelon patch to 13.3mg .  Place 1 patch to skin and change every 24 hours. 2.  Will check with cardiologist again regarding the primidone. 3.  Follow up 3 months.

## 2014-12-29 NOTE — Progress Notes (Signed)
NEUROLOGY FOLLOW UP OFFICE NOTE  Kara Ortega [id number]  HISTORY OF PRESENT ILLNESS: Kara Ortega is a 81 year old right-handed woman with history of a-fib (on [facility]), CHF, unstable angina, hypertension, fibromyalgia, sleep apnea, basal cell carcinoma, benign essential tremor, hypothyroidism, hyperlipidemia, and type II diabetes with neuropathy who follows up for essential tremor, Alzheimer's disease  UPDATE: She takes Exelon 9.5mg  patch.     For tremor, she has not yet started primidone.Marland Kitchen  HISTORY: She began having memory problems several years ago, while living in [place], [place], however it has more noticeably progressed over the past 1 to 2 years.  She moved to [place] last year.  It started with short-term memory problems.  She will forget content of conversation.  She would repeat herself frequently.  She frequently repeats questions to reconfirm things.  She requires help with administering her medications.  Her children set up her medications for the week in a pillbox.  She usually takes it correctly, but infrequently, she will miss a dose, usually because she is confused about the day of the week, even though it is written on the box.  She has problems with math and gets confused calculating the amount of insulin she uses.  She has left the stove on in the past.  She usually has meals prepared by her children or she gets [place] take-out.  She will use the stove occasionally to cook soup.  She now finds that she will forget names of acquaintances, as well as some family such as her sister.  She seldom drives, usually only to the [employer] which is behind her house.  [place] [facility] [place]2 a mile away.  She eats breakfast, but will typically graze the rest of the day.  She is able to perform her ADLs and keeps the house clean and does the laundry.  She does have difficulty with some simple tasks, such as operating the washer and dryer.  She  no longer handles her finances or pays the bills (her son does it).  She lives by herself.  In the past, she used to see people in the house who weren't there, but this hasn't happened for at least a year.  She does not exhibit delusions.  She denies depression.  She does have difficulty sleeping at times.  She goes to bed around midnight and wakes up about 1.5 hours later.  She will keep herself occupied by cooking soup until about 4 am.  She will watch TV and drift in and out of sleep until getting up for the day around 11am.  She often spends most of the day sitting and watching TV.  She takes Exelon 9.5mg  patch.  Her sister has Alzheimer's.  Her maternal grandmother and aunt had dementia.  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  She has a master's degree in administration and supervision.  She also has recurrent falls.  He has had gait instability for many years due to imbalance.  Over the past year, she has had some falls.  Often she says her left knee gives out.  She also reports pain in the left knee.  She has some known mild weakness in the left leg and was told by her former neurologist that she may have had a small stroke  PAST MEDICAL HISTORY: Past Medical History  Diagnosis Date  .  Abnormal heart rhythms   . Diabetes     insulin dependent  . Sleep apnea     uses bipap X12 years.  Marland Kitchen COPD (chronic obstructive pulmonary disease)   . CHF (congestive heart failure)   . A-fib   . Anginal pain   . Hypertension   . Stroke     LEFT SIDE RESIDUAL  . Shortness of breath   . Depression   . Chronic kidney disease     STAGE 3   . Memory loss   . Alzheimer disease   . Leg pain   . Back pain   . Neuropathy     MEDICATIONS: Current Outpatient Prescriptions on File Prior to Visit  Medication Sig Dispense Refill  . ALPRAZolam (XANAX) 0.25 MG tablet 1-2 tabs twice daily    . atorvastatin  (LIPITOR) 10 MG tablet Take 10 mg by mouth at bedtime.     Marland Kitchen b complex vitamins tablet Take 1 tablet by mouth daily.    . Cholecalciferol (HM VITAMIN D3) 4000 UNITS CAPS Take 4,000 Units by mouth daily.     . citalopram (CELEXA) 20 MG tablet Take 20 mg by mouth daily.    . colchicine 0.6 MG tablet Take 0.6 mg by mouth daily as needed (gout).    Marland Kitchen diclofenac sodium (VOLTAREN) 1 % GEL Apply 4 g topically 4 (four) times daily as needed (pain).     Marland Kitchen diltiazem (TIAZAC) 120 MG 24 hr capsule Take 120 mg by mouth as needed.    . diltiazem (TIAZAC) 240 MG 24 hr capsule Take 240 mg by mouth daily.    Marland Kitchen ELIQUIS 5 MG TABS tablet TAKE 1 TABLET BY MOUTH TWICE DAILY 60 tablet 0  . febuxostat (ULORIC) 40 MG tablet Take 40 mg by mouth daily.    . fenofibrate micronized (LOFIBRA) 134 MG capsule Take 134 mg by mouth daily.    . furosemide (LASIX) 40 MG tablet Take 20 mg by mouth daily.     . isosorbide mononitrate (IMDUR) 30 MG 24 hr tablet Take 30 mg by mouth every 12 (twelve) hours.    . Liraglutide (VICTOZA) 18 MG/3ML SOPN 1.2 mg once daily    . losartan (COZAAR) 25 MG tablet Take 25 mg by mouth daily.    . metoprolol succinate (TOPROL-XL) 25 MG 24 hr tablet Take 25 mg by mouth daily.    . Multiple Vitamins-Minerals (MULTIVITAMIN WITH MINERALS) tablet Take 1 tablet by mouth daily.    . nitroGLYCERIN (NITROSTAT) 0.4 MG SL tablet Place 1 tablet (0.4 mg total) under the tongue every 5 (five) minutes as needed for chest pain. 25 tablet prn  . pantoprazole (PROTONIX) 40 MG tablet Take 40 mg by mouth daily.    . potassium chloride (KLOR-CON) 20 MEQ packet Take 40 mEq by mouth daily.     . pregabalin (LYRICA) 75 MG capsule Take 75 mg by mouth 2 (two) times daily.    Marland Kitchen spironolactone (ALDACTONE) 25 MG tablet Take 25 mg by mouth daily.    Nelva Nay SOLOSTAR 300 UNIT/ML SOPN 70 units at bedtime  5  . traMADol (ULTRAM) 50 MG tablet Take 50 mg by mouth every 6 (six) hours as needed.    . zolpidem (AMBIEN) 5 MG tablet  Take 5 mg by mouth at bedtime as needed for sleep.    . primidone (MYSOLINE) 50 MG tablet Take 0.5 tablets (25 mg total) by mouth at bedtime. (Patient not taking: Reported on 12/29/2014) 30 tablet 0  No current facility-administered medications on file prior to visit.    ALLERGIES: Allergies  Allergen Reactions  . Avandia [Rosiglitazone] Other (See Comments)    edema  . Benazepril Other (See Comments)    unknown  . Dilaudid [Hydromorphone Hcl]     "does not tolerate strong pain medications" per pt  . Fosamax [Alendronate Sodium] Nausea And Vomiting  . Talwin [Pentazocine] Nausea And Vomiting  . Tetanus Toxoids     "allergic to something in the tetanus injection" per patient  . Zyrtec [Cetirizine] Other (See Comments)    unknown    FAMILY HISTORY: Family History  Problem Relation Age of Onset  . Heart disease Mother   . Heart disease Sister   . Dementia Sister     SOCIAL HISTORY: Social History   Social History  . Marital Status: Single    Spouse Name: N/A  . Number of Children: 3  . Years of Education: N/A   Occupational History  . retired    Social History Main Topics  . Smoking status: Former Smoker -- 1.50 packs/day for 18 years    Types: Cigarettes    Quit date: 03/31/1968  . Smokeless tobacco: Never Used  . Alcohol Use: No  . Drug Use: No  . Sexual Activity: No   Other Topics Concern  . Not on file   Social History Narrative    REVIEW OF SYSTEMS: Constitutional: No fevers, chills, or sweats, no generalized fatigue, change in appetite Eyes: No visual changes, double vision, eye pain Ear, nose and throat: No hearing loss, ear pain, nasal congestion, sore throat Cardiovascular: No chest pain, palpitations Respiratory:  No shortness of breath at rest or with exertion, wheezes GastrointestinaI: No nausea, vomiting, diarrhea, abdominal pain, fecal incontinence Genitourinary:  No dysuria, urinary retention or frequency Musculoskeletal:  No neck pain,  back pain Integumentary: No rash, pruritus, skin lesions Neurological: as above Psychiatric: No depression, insomnia, anxiety Endocrine: No palpitations, fatigue, diaphoresis, mood swings, change in appetite, change in weight, increased thirst Hematologic/Lymphatic:  No anemia, purpura, petechiae. Allergic/Immunologic: no itchy/runny eyes, nasal congestion, recent allergic reactions, rashes  PHYSICAL EXAM: Filed Vitals:   12/29/14 1459  BP: 122/78  Pulse: 80   General: No acute distress.   Head:  Normocephalic/atraumatic Eyes:  Fundoscopic exam unremarkable without vessel changes, exudates, hemorrhages or papilledema. Neck: supple, no paraspinal tenderness, full range of motion Heart:  Regular rate and rhythm Lungs:  Clear to auscultation bilaterally Back: No paraspinal tenderness Neurological Exam: alert and oriented to person, place, and time. Attention span and concentration intact, delayed recall poor, remote memory intact, fund of knowledge intact.  Speech fluent and not dysarthric, language intact.   CN II-XII intact. Fundoscopic exam unremarkable without vessel changes, exudates, hemorrhages or papilledema.  Bulk and tone normal, muscle strength 5-/5 left hip flexion.  Otherwise 5/5 throughout.  Postural and kinetic tremor noted bilaterally.  No rigidity or bradykinesia.  Sensation to light touch, temperature and vibration intact.  Deep tendon reflexes 1+ throughout, toes downgoing.  Finger to nose testing without dysmetria.  Mildly wide-based gait with some unsteadiness.   IMPRESSION: Alzheimer's disease Essential tremor  PLAN: Will check again with Dr. Marlou Porch, her cardiologist, regarding starting primidone 25mg  at bedtime in conjunction with Eliquis. Increase Exelon patch to 13.3mg  daily Follow up in 3 months.  29 minutes spent face to face with patient, over 50% spent discussing diagnosis and management.  Metta Clines, DO  CC:  Carol Ada, MD

## 2015-01-03 ENCOUNTER — Other Ambulatory Visit: Payer: Self-pay | Admitting: *Deleted

## 2015-01-03 ENCOUNTER — Other Ambulatory Visit: Payer: Self-pay | Admitting: Neurology

## 2015-01-03 MED ORDER — PROPRANOLOL HCL ER 60 MG PO CP24: 60.0000 mg | ORAL_CAPSULE | Freq: Every day | ORAL | Status: DC

## 2015-01-03 NOTE — Telephone Encounter (Signed)
Could we contact this patient to let her know that I contacted her cardiologist, Dr. Marlou Porch. He said it would be okay to start a low-dose of propranolol, even though she is on metoprolol. I would like to start Inderal LA 60mg  daily. We will hold off on primidone for now. She should be cautious for any lightheadedness. Also, could you copy and paste this message to the telephone encounter?  Thanks       Caryl Pina

## 2015-01-03 NOTE — Telephone Encounter (Signed)
Could we contact this patient to let her know that I contacted her cardiologist, Dr. Marlou Porch. He said it would be okay to start a low-dose of propranolol, even though she is on metoprolol. I would like to start Inderal LA 60mg  daily. We will hold off on primidone for now. She should be cautious for any lightheadedness. Also, could you copy and paste this message to the telephone encounter?  Thanks

## 2015-01-03 NOTE — Telephone Encounter (Signed)
Rx sent again

## 2015-01-05 ENCOUNTER — Ambulatory Visit: Payer: Medicare Other | Admitting: Podiatry

## 2015-01-12 ENCOUNTER — Ambulatory Visit: Payer: Medicare Other | Admitting: Podiatry

## 2015-01-16 ENCOUNTER — Other Ambulatory Visit: Payer: Self-pay | Admitting: *Deleted

## 2015-01-16 MED ORDER — PROPRANOLOL HCL ER 60 MG PO CP24: ORAL_CAPSULE | ORAL | Status: DC

## 2015-01-17 ENCOUNTER — Other Ambulatory Visit: Payer: Self-pay | Admitting: Cardiology

## 2015-01-18 ENCOUNTER — Telehealth: Payer: Self-pay | Admitting: Neurology

## 2015-01-18 NOTE — Telephone Encounter (Signed)
Arlethia/ from [employer]/ called with Auth/Medical questions/ call back @ [phone]

## 2015-01-18 NOTE — Telephone Encounter (Signed)
Called son. PA needed for propanolol. PA submitted Via [email or web address]. Key - [id number]

## 2015-01-18 NOTE — Telephone Encounter (Signed)
Pt son [name] [phone] called and needs to talk to someone about his mother medication prior approval

## 2015-01-19 NOTE — Telephone Encounter (Signed)
Prior authorization for propanolol denied.  Appeal process was started on 01/19/15 @ 130. Reference Number : [id number]

## 2015-01-19 NOTE — Telephone Encounter (Signed)
Spoke with [employer] yesterday afternoon. PA questions were answered. Awaiting a response.

## 2015-01-22 NOTE — Telephone Encounter (Signed)
Kara Ortega from [place] called and said medication has been approved and it is authorized from 01/18/2015-01/18/2016/Dawn  CB# [phone]    Left message on Rick's vm.

## 2015-01-26 NOTE — Telephone Encounter (Signed)
Received conformation fax about approval. Authorization number [id number]. [phone]

## 2015-01-29 ENCOUNTER — Emergency Department (HOSPITAL_COMMUNITY): Payer: Medicare Other

## 2015-01-29 ENCOUNTER — Emergency Department (HOSPITAL_COMMUNITY)
Admission: EM | Admit: 2015-01-29 | Discharge: 2015-01-29 | Disposition: A | Discharge status: Home / Self Care | Payer: Medicare Other | Attending: Emergency Medicine | Admitting: Emergency Medicine

## 2015-01-29 DIAGNOSIS — W1839XA Other fall on same level, initial encounter: Secondary | ICD-10-CM | POA: Insufficient documentation

## 2015-01-29 DIAGNOSIS — F0280 Dementia in other diseases classified elsewhere without behavioral disturbance: Secondary | ICD-10-CM | POA: Diagnosis not present

## 2015-01-29 DIAGNOSIS — G4730 Sleep apnea, unspecified: Secondary | ICD-10-CM | POA: Diagnosis not present

## 2015-01-29 DIAGNOSIS — Z79899 Other long term (current) drug therapy: Secondary | ICD-10-CM | POA: Insufficient documentation

## 2015-01-29 DIAGNOSIS — F329 Major depressive disorder, single episode, unspecified: Secondary | ICD-10-CM | POA: Insufficient documentation

## 2015-01-29 DIAGNOSIS — I509 Heart failure, unspecified: Secondary | ICD-10-CM | POA: Diagnosis not present

## 2015-01-29 DIAGNOSIS — Z794 Long term (current) use of insulin: Secondary | ICD-10-CM | POA: Insufficient documentation

## 2015-01-29 DIAGNOSIS — Z7902 Long term (current) use of antithrombotics/antiplatelets: Secondary | ICD-10-CM | POA: Diagnosis not present

## 2015-01-29 DIAGNOSIS — S82831A Other fracture of upper and lower end of right fibula, initial encounter for closed fracture: Secondary | ICD-10-CM | POA: Insufficient documentation

## 2015-01-29 DIAGNOSIS — E119 Type 2 diabetes mellitus without complications: Secondary | ICD-10-CM | POA: Insufficient documentation

## 2015-01-29 DIAGNOSIS — Z9981 Dependence on supplemental oxygen: Secondary | ICD-10-CM | POA: Insufficient documentation

## 2015-01-29 DIAGNOSIS — S82401A Unspecified fracture of shaft of right fibula, initial encounter for closed fracture: Secondary | ICD-10-CM

## 2015-01-29 DIAGNOSIS — N189 Chronic kidney disease, unspecified: Secondary | ICD-10-CM | POA: Insufficient documentation

## 2015-01-29 DIAGNOSIS — I209 Angina pectoris, unspecified: Secondary | ICD-10-CM | POA: Insufficient documentation

## 2015-01-29 DIAGNOSIS — W19XXXA Unspecified fall, initial encounter: Secondary | ICD-10-CM

## 2015-01-29 DIAGNOSIS — S99911A Unspecified injury of right ankle, initial encounter: Secondary | ICD-10-CM | POA: Diagnosis present

## 2015-01-29 DIAGNOSIS — Z87891 Personal history of nicotine dependence: Secondary | ICD-10-CM | POA: Diagnosis not present

## 2015-01-29 DIAGNOSIS — J449 Chronic obstructive pulmonary disease, unspecified: Secondary | ICD-10-CM | POA: Diagnosis not present

## 2015-01-29 DIAGNOSIS — Z8673 Personal history of transient ischemic attack (TIA), and cerebral infarction without residual deficits: Secondary | ICD-10-CM | POA: Insufficient documentation

## 2015-01-29 DIAGNOSIS — G309 Alzheimer's disease, unspecified: Secondary | ICD-10-CM | POA: Insufficient documentation

## 2015-01-29 DIAGNOSIS — I129 Hypertensive chronic kidney disease with stage 1 through stage 4 chronic kidney disease, or unspecified chronic kidney disease: Secondary | ICD-10-CM | POA: Diagnosis not present

## 2015-01-29 DIAGNOSIS — Y92091 Bathroom in other non-institutional residence as the place of occurrence of the external cause: Secondary | ICD-10-CM | POA: Insufficient documentation

## 2015-01-29 DIAGNOSIS — I4891 Unspecified atrial fibrillation: Secondary | ICD-10-CM | POA: Insufficient documentation

## 2015-01-29 DIAGNOSIS — G629 Polyneuropathy, unspecified: Secondary | ICD-10-CM | POA: Diagnosis not present

## 2015-01-29 DIAGNOSIS — Y9389 Activity, other specified: Secondary | ICD-10-CM | POA: Diagnosis not present

## 2015-01-29 DIAGNOSIS — S0990XA Unspecified injury of head, initial encounter: Secondary | ICD-10-CM | POA: Insufficient documentation

## 2015-01-29 DIAGNOSIS — Y998 Other external cause status: Secondary | ICD-10-CM | POA: Diagnosis not present

## 2015-01-29 LAB — CBC WITH DIFFERENTIAL/PLATELET
BASOS ABS: 0 10*3/uL (ref 0.0–0.1)
BASOS PCT: 0 %
EOS ABS: 0.1 10*3/uL (ref 0.0–0.7)
Eosinophils Relative: 1 %
HCT: 43 % (ref 36.0–46.0)
HEMOGLOBIN: 14.6 g/dL (ref 12.0–15.0)
LYMPHS ABS: 2.2 10*3/uL (ref 0.7–4.0)
Lymphocytes Relative: 26 %
MCH: 29.7 pg (ref 26.0–34.0)
MCHC: 34 g/dL (ref 30.0–36.0)
MCV: 87.6 fL (ref 78.0–100.0)
Monocytes Absolute: 0.6 10*3/uL (ref 0.1–1.0)
Monocytes Relative: 8 %
NEUTROS PCT: 65 %
Neutro Abs: 5.4 10*3/uL (ref 1.7–7.7)
Platelets: 156 10*3/uL (ref 150–400)
RBC: 4.91 MIL/uL (ref 3.87–5.11)
RDW: 15.4 % (ref 11.5–15.5)
WBC: 8.3 10*3/uL (ref 4.0–10.5)

## 2015-01-29 LAB — BASIC METABOLIC PANEL
Anion gap: 8 (ref 5–15)
BUN: 26 mg/dL — ABNORMAL HIGH (ref 6–20)
CHLORIDE: 103 mmol/L (ref 101–111)
CO2: 25 mmol/L (ref 22–32)
CREATININE: 0.82 mg/dL (ref 0.44–1.00)
Calcium: 9.6 mg/dL (ref 8.9–10.3)
Glucose, Bld: 225 mg/dL — ABNORMAL HIGH (ref 65–99)
Potassium: 5 mmol/L (ref 3.5–5.1)
SODIUM: 136 mmol/L (ref 135–145)

## 2015-01-29 LAB — CBG MONITORING, ED: GLUCOSE-CAPILLARY: 174 mg/dL — AB (ref 65–99)

## 2015-01-29 MED ORDER — HYDROCODONE-ACETAMINOPHEN 5-325 MG PO TABS: 1.0000 | ORAL_TABLET | Freq: Four times a day (QID) | ORAL | Status: DC | PRN

## 2015-01-29 MED ORDER — HYDROCODONE-ACETAMINOPHEN 5-325 MG PO TABS
1.0000 | ORAL_TABLET | Freq: Once | ORAL | Status: AC
  Administered 2015-01-29: 1 via ORAL
  Filled 2015-01-29: qty 1

## 2015-01-29 NOTE — ED Notes (Signed)
Bed: [facility] Expected date:  Expected time:  Means of arrival:  Comments: EMS - fall, ankle swelling

## 2015-01-29 NOTE — ED Provider Notes (Signed)
CSN: [id number]     Arrival date & time 01/29/15  1154 History   First MD Initiated Contact with Patient 01/29/15 1330     Chief Complaint  Patient presents with  . Fall  . Ankle Pain     (Consider location/radiation/quality/duration/timing/severity/associated sxs/prior Treatment) HPI  81 year old female presents after a fall at home. She states she was bending over to get something under the sink and lost her balance and fell over. There was no lightheadedness or dizziness. She feels like she just leaned over too far and fell. The patient thinks she hit her head and has a mild headache. She is also complaining of right lateral ankle pain and swelling. She states she is on a blood thinner but when asked what kind she states "I don't know, I have dementia".  Past Medical History  Diagnosis Date  . Abnormal heart rhythms   . Diabetes     insulin dependent  . Sleep apnea     uses bipap X12 years.  Marland Kitchen COPD (chronic obstructive pulmonary disease)   . CHF (congestive heart failure)   . A-fib   . Anginal pain   . Hypertension   . Stroke     LEFT SIDE RESIDUAL  . Shortness of breath   . Depression   . Chronic kidney disease     STAGE 3   . Memory loss   . Alzheimer disease   . Leg pain   . Back pain   . Neuropathy    Past Surgical History  Procedure Laterality Date  . Breast surgery      X4  . Colon surgery      removed 14 inches of colon  . Hernia repair    . Breast lumpectomy     Family History  Problem Relation Age of Onset  . Heart disease Mother   . Heart disease Sister   . Dementia Sister    Social History  Substance Use Topics  . Smoking status: Former Smoker -- 1.50 packs/day for 18 years    Types: Cigarettes    Quit date: 03/31/1968  . Smokeless tobacco: Never Used  . Alcohol Use: No   OB History    No data available     Review of Systems  Gastrointestinal: Negative for vomiting.  Musculoskeletal: Positive for joint swelling and arthralgias.   Neurological: Positive for headaches. Negative for weakness.  All other systems reviewed and are negative.     Allergies  Avandia; Benazepril; Dilaudid; Fosamax; Talwin; Tetanus toxoids; and Zyrtec  Home Medications   Prior to Admission medications   Medication Sig Start Date End Date Taking? Authorizing Provider  ALPRAZolam (XANAX) 0.25 MG tablet Take 0.25 mg by mouth at bedtime.    Yes Historical Provider, MD  diclofenac sodium (VOLTAREN) 1 % GEL Apply 4 g topically 4 (four) times daily as needed (For pain.).    Yes Historical Provider, MD  ELIQUIS 5 MG TABS tablet TAKE 1 TABLET BY MOUTH TWICE DAILY 01/18/15  Yes Jerline Pain, MD  Liraglutide (VICTOZA) 18 MG/3ML SOPN Inject 1.2 mg into the skin daily.    Yes Historical Provider, MD  nitroGLYCERIN (NITROSTAT) 0.4 MG SL tablet Place 1 tablet (0.4 mg total) under the tongue every 5 (five) minutes as needed for chest pain. 05/19/14  Yes Jerline Pain, MD  Rivastigmine 13.3 MG/24HR PT24 Place 1 patch on skin and change every 24 hours. 12/29/14  Yes Adam R Tomi Likens, DO  TOUJEO SOLOSTAR 300 UNIT/ML SOPN Inject  80 mg into the skin at bedtime.  05/22/14  Yes Historical Provider, MD  traMADol (ULTRAM) 50 MG tablet Take 25-50 mg by mouth every 6 (six) hours as needed (For pain.).    Yes Historical Provider, MD  zolpidem (AMBIEN) 5 MG tablet Take 5 mg by mouth at bedtime as needed for sleep.   Yes Historical Provider, MD  atorvastatin (LIPITOR) 10 MG tablet Take 10 mg by mouth at bedtime.     Historical Provider, MD  b complex vitamins tablet Take 1 tablet by mouth daily.    Historical Provider, MD  Cholecalciferol (HM VITAMIN D3) 4000 UNITS CAPS Take 4,000 Units by mouth daily.     Historical Provider, MD  citalopram (CELEXA) 20 MG tablet Take 20 mg by mouth daily.    Historical Provider, MD  colchicine 0.6 MG tablet Take 0.6 mg by mouth daily.     Historical Provider, MD  diltiazem (TIAZAC) 120 MG 24 hr capsule Take 120 mg by mouth as needed.     Historical Provider, MD  diltiazem (TIAZAC) 240 MG 24 hr capsule Take 240 mg by mouth daily.    Historical Provider, MD  febuxostat (ULORIC) 40 MG tablet Take 40 mg by mouth daily.    Historical Provider, MD  fenofibrate micronized (LOFIBRA) 134 MG capsule Take 134 mg by mouth daily.    Historical Provider, MD  furosemide (LASIX) 40 MG tablet Take 20 mg by mouth daily.     Historical Provider, MD  isosorbide mononitrate (IMDUR) 30 MG 24 hr tablet Take 30 mg by mouth every 12 (twelve) hours.    Historical Provider, MD  losartan (COZAAR) 25 MG tablet Take 25 mg by mouth daily.    Historical Provider, MD  metoprolol succinate (TOPROL-XL) 25 MG 24 hr tablet Take 25 mg by mouth daily.    Historical Provider, MD  Multiple Vitamins-Minerals (MULTIVITAMIN WITH MINERALS) tablet Take 1 tablet by mouth daily.    Historical Provider, MD  pantoprazole (PROTONIX) 40 MG tablet Take 40 mg by mouth daily.    Historical Provider, MD  potassium chloride (KLOR-CON) 20 MEQ packet Take 40 mEq by mouth daily.     Historical Provider, MD  pregabalin (LYRICA) 75 MG capsule Take 75 mg by mouth 2 (two) times daily.    Historical Provider, MD  primidone (MYSOLINE) 50 MG tablet Take 0.5 tablets (25 mg total) by mouth at bedtime. Patient not taking: Reported on 12/29/2014 09/14/14   Pieter Partridge, DO  propranolol ER (INDERAL LA) 60 MG 24 hr capsule TAKE 1 CAPSULE(60 MG) BY MOUTH DAILY 01/16/15   Pieter Partridge, DO  spironolactone (ALDACTONE) 25 MG tablet Take 25 mg by mouth daily.    Historical Provider, MD   BP 109/44 mmHg  Pulse 59  Temp(Src) 97.6 F (36.4 C)  Resp 16  SpO2 94% Physical Exam  Constitutional: She is oriented to person, place, and time. She appears well-developed and well-nourished.  HENT:  Head: Normocephalic and atraumatic.  Right Ear: External ear normal.  Left Ear: External ear normal.  Nose: Nose normal.  Eyes: EOM are normal. Pupils are equal, round, and reactive to light. Right eye exhibits no  discharge. Left eye exhibits no discharge.  Cardiovascular: Normal rate, regular rhythm and normal heart sounds.   Pulses:      Dorsalis pedis pulses are 2+ on the right side, and 2+ on the left side.  Pulmonary/Chest: Effort normal and breath sounds normal.  Abdominal: Soft. She exhibits no distension. There  is no tenderness.  Musculoskeletal:       Right ankle: She exhibits decreased range of motion and swelling. She exhibits no deformity and normal pulse. Tenderness. Lateral malleolus tenderness found.       Right lower leg: She exhibits tenderness (proximal fibular head).       Right foot: There is no tenderness.  Neurological: She is alert and oriented to person, place, and time.  Equal strength in all 4 extremities  Skin: Skin is warm and dry.  Nursing note and vitals reviewed.   ED Course  Procedures (including critical care time) Labs Review Labs Reviewed  BASIC METABOLIC PANEL - Abnormal; Notable for the following:    Glucose, Bld 225 (*)    BUN 26 (*)    All other components within normal limits  CBC WITH DIFFERENTIAL/PLATELET    Imaging Review Dg Tibia/fibula Right  01/29/2015  CLINICAL DATA:  RIGHT ankle fracture, proximal RIGHT lower leg pain EXAM: RIGHT TIBIA AND FIBULA - 2 VIEW COMPARISON:  None FINDINGS: Diffuse osseous demineralization. Knee joint space narrowing and spur formation greatest at lateral compartment. Ankle joint alignment normal. Oblique lateral malleolar fracture again seen. Overlying soft tissue swelling lateral ankle. No additional fracture, dislocation, or bone destruction. IMPRESSION: Lateral malleolar fracture. Degenerative changes RIGHT knee. No additional acute bony abnormalities. Electronically Signed   By: Lavonia Dana M.D.   On: 01/29/2015 14:33   Dg Ankle Complete Right  01/29/2015  CLINICAL DATA:  Ankle swelling EXAM: RIGHT ANKLE - COMPLETE 3+ VIEW COMPARISON:  03/23/2014 FINDINGS: Oblique nondisplaced fracture of the lateral malleolus with  overlying soft tissue swelling. No other fracture or dislocation. Enthesopathic changes of the Achilles tendon insertion. Soft tissues are otherwise unremarkable. IMPRESSION: Oblique nondisplaced fracture of the lateral malleolus with overlying soft tissue swelling. Electronically Signed   By: Kathreen Devoid   On: 01/29/2015 12:55   Ct Head Wo Contrast  01/29/2015  CLINICAL DATA:  Bent down and bathroom, lost her balance and fell, no loss of consciousness, denies striking head, on anticoagulants, history dementia, diabetes mellitus, COPD, CHF, hypertension, atrial fibrillation EXAM: CT HEAD WITHOUT CONTRAST TECHNIQUE: Contiguous axial images were obtained from the base of the skull through the vertex without intravenous contrast. COMPARISON:  03/23/2014 FINDINGS: Generalized atrophy. Normal ventricular morphology. No midline shift or mass effect. Minimal small vessel chronic ischemic changes of deep cerebral white matter. No intracranial hemorrhage, mass lesion, or acute infarction. Visualized paranasal sinuses and mastoid air cells clear. Bones unremarkable. IMPRESSION: Atrophy with minimal small vessel chronic ischemic changes of deep cerebral white matter. No acute intracranial abnormalities. Electronically Signed   By: Lavonia Dana M.D.   On: 01/29/2015 14:25   I have personally reviewed and evaluated these images and lab results as part of my medical decision-making.   EKG Interpretation   Date/Time:  Monday January 29 2015 14:56:24 EDT Ventricular Rate:  68 PR Interval:  192 QRS Duration: 88 QT Interval:  408 QTC Calculation: 433 R Axis:   89 Text Interpretation:  Normal sinus rhythm with sinus arrhythmia Normal ECG  Confirmed by Takeshia Wenk  MD, Doris Gruhn ([id number]) on 01/29/2015 3:22:38 PM      MDM   Final diagnoses:  Fall, initial encounter  Fibula fracture, right, closed, initial encounter    Patient with what sounds like a mechanical fall and subsequent nondisplaced right distal fibula  fracture. Place in a [place]. Patient has a reported history of dementia although she is alert and oriented 4 here and has  a clear and shining of what is happening. I discussed with her son who currently lives in [place]. He is on his way over to [facility] to help take care of her and her fracture and needing to be nonweightbearing on that extremity. The patient does live by herself. There's a question of whether or not she was hypotensive by EMS and this lab work was obtained but is unremarkable. Head CT negative given possible head injury with on Eliquis. Stable for discharge home with son.    Sherwood Gambler, MD 01/29/15 [phone]

## 2015-01-29 NOTE — ED Notes (Signed)
Per EMS. Pt from home. Pt bent down in the bathroom, lost her balance and fell. Pt denies LOC or hitting head. Only acute complaint was R ankle swelling. No obvious deformity, pt was able to wiggle toes with pain. Also has hx of chronic back pain. Denies any neck pain. Pt has a hx of dementia. EMS contacted family who noted that pt seemed oriented per norm, ax0 4x, pt normally makes decisions for herself. Pt took 25mg  of home tramadol prior to EMS transport.

## 2015-01-29 NOTE — Progress Notes (Signed)
Pt states she has a rollator at home  Reports previous she was seen by [facility] and if HHPT needed she would prefer same therapist  Spoke with Dr Wilson Singer EDP Cm sent [facility] coordinator,  Cyril Mourning, referral for HHPT for pt

## 2015-01-29 NOTE — ED Notes (Signed)
Son arrived and took pt back home

## 2015-02-19 ENCOUNTER — Ambulatory Visit (INDEPENDENT_AMBULATORY_CARE_PROVIDER_SITE_OTHER): Payer: Medicare Other | Admitting: Cardiology

## 2015-02-19 ENCOUNTER — Encounter: Payer: Self-pay | Admitting: Cardiology

## 2015-02-19 ENCOUNTER — Ambulatory Visit: Payer: Medicare Other | Admitting: Cardiology

## 2015-02-19 VITALS — BP 122/58 | HR 68 | Ht 61.0 in | Wt 226.0 lb

## 2015-02-19 DIAGNOSIS — E7800 Pure hypercholesterolemia, unspecified: Secondary | ICD-10-CM

## 2015-02-19 DIAGNOSIS — Z7901 Long term (current) use of anticoagulants: Secondary | ICD-10-CM | POA: Diagnosis not present

## 2015-02-19 DIAGNOSIS — W19XXXD Unspecified fall, subsequent encounter: Secondary | ICD-10-CM

## 2015-02-19 DIAGNOSIS — I952 Hypotension due to drugs: Secondary | ICD-10-CM

## 2015-02-19 DIAGNOSIS — Z79899 Other long term (current) drug therapy: Secondary | ICD-10-CM | POA: Diagnosis not present

## 2015-02-19 DIAGNOSIS — I480 Paroxysmal atrial fibrillation: Secondary | ICD-10-CM

## 2015-02-19 DIAGNOSIS — I350 Nonrheumatic aortic (valve) stenosis: Secondary | ICD-10-CM

## 2015-02-19 MED ORDER — APIXABAN 5 MG PO TABS: 5.0000 mg | ORAL_TABLET | Freq: Two times a day (BID) | ORAL | Status: AC

## 2015-02-19 NOTE — Patient Instructions (Signed)
Medication Instructions:  Please contact our office with any medication changes that may occur.  Labwork: None  Testing/Procedures: None  Follow-Up: Your physician recommends that you schedule a follow-up appointment at the beginning of January with Dr. Marlou Porch.  Any Other Special Instructions Will Be Listed Below (If Applicable).     If you need a refill on your cardiac medications before your next appointment, please call your pharmacy.

## 2015-02-19 NOTE — Progress Notes (Signed)
Cardiology Office Note   Date:  02/19/2015   ID:  Kara Ortega, DOB Aug 16, 1929, MRN [id number]  PCP:  [name], MD  Cardiologist: Dr. Marlou Porch     Chief Complaint  Patient presents with  . Appointment    has had some chest discomfort.      History of Present Illness: Kara Ortega is a 81 y.o. female who presents for eval with recent falls and eval in ER.    She has a hx of being hospitalized on 03/27/14 with acute kidney injury, atrial fibrillation , fall , weight gain, fluid overload mom a paroxysmal atrial fibrillation , Cardizem 240, Toprol 25. Preferred to stay on Coumadin. Discussion was noted about discontinuation but she refused and wanted to continue.. She had near syncope, fall and was bradycardic and hypotensive. No acute fractures. She also had moderately severe aortic stenosis and possible mitral stenosis. She was changed her over from Coumadin to Eliquis. Ease of use.   Recently pt had propranolol ER 60 mg added to her meds for tremor.  She continued on her toprol XL  25 mg and her diltiazem 240 mg.  On her ER visit 01/29/15 her pulse was 59 and BP 109/44. She did have a nondisplaced Rt distal fibula fracture. CT head was negative. There was a question of hypotension by EMS.   She did have another fall on the propranolol. Her BP then was 97/40 her son stopped the propranolol.  She has done better since that time. No chest pain and no SOB.  Past Medical History  Diagnosis Date  . Abnormal heart rhythms   . Diabetes ([place])     insulin dependent  . Sleep apnea     uses bipap X12 years.  Marland Kitchen COPD (chronic obstructive pulmonary disease) ([place])   . CHF (congestive heart failure) ([place])   . A-fib ([place])   . Anginal pain ([place])   . Hypertension   . Stroke ([place])     LEFT SIDE RESIDUAL  . Shortness of breath   . Depression   . Chronic kidney disease     STAGE 3   . Memory loss   . Alzheimer disease   . Leg pain   . Back pain   . Neuropathy [facility])      Past Surgical History  Procedure Laterality Date  . Breast surgery      X4  . Colon surgery      removed 14 inches of colon  . Hernia repair    . Breast lumpectomy       Current Outpatient Prescriptions  Medication Sig Dispense Refill  . ALPRAZolam (XANAX) 0.25 MG tablet Take 0.25 mg by mouth at bedtime.     Marland Kitchen atorvastatin (LIPITOR) 10 MG tablet Take 10 mg by mouth at bedtime.     Marland Kitchen b complex vitamins tablet Take 1 tablet by mouth daily.    . Cholecalciferol (HM VITAMIN D3) 4000 UNITS CAPS Take 4,000 Units by mouth daily.     . citalopram (CELEXA) 20 MG tablet Take 20 mg by mouth daily.    . colchicine 0.6 MG tablet Take 0.6 mg by mouth daily.     . diclofenac sodium (VOLTAREN) 1 % GEL Apply 4 g topically 4 (four) times daily as needed (For pain.).     Marland Kitchen diltiazem (TIAZAC) 240 MG 24 hr capsule Take 240 mg by mouth daily.    Marland Kitchen ELIQUIS 5 MG TABS tablet TAKE 1 TABLET BY MOUTH TWICE DAILY 60 tablet 6  .  febuxostat (ULORIC) 40 MG tablet Take 40 mg by mouth daily.    . fenofibrate micronized (LOFIBRA) 134 MG capsule Take 134 mg by mouth daily.    . furosemide (LASIX) 40 MG tablet Take 40 mg by mouth daily.     Marland Kitchen HYDROcodone-acetaminophen (NORCO) 5-325 MG tablet Take 1 tablet by mouth every 6 (six) hours as needed for severe pain. 20 tablet 0  . isosorbide mononitrate (IMDUR) 30 MG 24 hr tablet Take 60 mg by mouth daily.     . Liraglutide (VICTOZA) 18 MG/3ML SOPN Inject 1.2 mg into the skin daily.     Marland Kitchen losartan (COZAAR) 25 MG tablet Take 25 mg by mouth daily.    Marland Kitchen LYRICA 100 MG capsule Take 100-200 mg by mouth 2 (two) times daily. She takes one capsule in the morning and two capsules at bedtime.  0  . metoprolol succinate (TOPROL-XL) 25 MG 24 hr tablet Take 25 mg by mouth 2 (two) times daily.     . Multiple Vitamins-Minerals (MULTIVITAMIN WITH MINERALS) tablet Take 1 tablet by mouth daily.    . nitroGLYCERIN (NITROSTAT) 0.4 MG SL tablet Place 1 tablet (0.4 mg total) under the tongue  every 5 (five) minutes as needed for chest pain. 25 tablet prn  . pantoprazole (PROTONIX) 40 MG tablet Take 40 mg by mouth daily.    . potassium chloride (KLOR-CON) 20 MEQ packet Take 40 mEq by mouth 2 (two) times daily.     . primidone (MYSOLINE) 50 MG tablet Take 0.5 tablets (25 mg total) by mouth at bedtime. 30 tablet 0  . propranolol ER (INDERAL LA) 60 MG 24 hr capsule TAKE 1 CAPSULE(60 MG) BY MOUTH DAILY (Patient taking differently: Take 60 mg by mouth daily. ) 90 capsule 1  . Rivastigmine 13.3 MG/24HR PT24 Place 1 patch on skin and change every 24 hours. 30 patch 3  . spironolactone (ALDACTONE) 25 MG tablet Take 25 mg by mouth 2 (two) times daily.     Nelva Nay SOLOSTAR 300 UNIT/ML SOPN Inject 80 mg into the skin at bedtime.   5  . zolpidem (AMBIEN) 5 MG tablet Take 5 mg by mouth at bedtime as needed for sleep.     No current facility-administered medications for this visit.    Allergies:   Avandia; Benazepril; Dilaudid; Fosamax; Talwin; Tetanus toxoids; and Zyrtec    Social History:  The patient  reports that she quit smoking about 46 years ago. Her smoking use included Cigarettes. She has a 27 pack-year smoking history. She has never used smokeless tobacco. She reports that she does not drink alcohol or use illicit drugs.   Family History:  The patient's family history includes Alzheimer's disease in her sister; Dementia in her maternal aunt and maternal grandmother; Heart disease in her mother and sister; Mental illness in her other.    ROS:  General:no colds or fevers, no weight changes Skin:no rashes or ulcers HEENT:no blurred vision, no congestion CV:see HPI PUL:see HPI GI:no diarrhea constipation or melena, no indigestion GU:no hematuria, no dysuria MS:no joint pain, no claudication- { boot on rt foot Neuro:no syncope, no lightheadedness Endo:+ diabetes, no thyroid disease  Wt Readings from Last 3 Encounters:  02/19/15 226 lb (102.513 kg)  12/29/14 230 lb (104.327 kg)    09/14/14 226 lb 12.8 oz (102.876 kg)     PHYSICAL EXAM: VS:  BP 122/58 mmHg  Pulse 68  Ht 5\' 1"  (1.549 m)  Wt 226 lb (102.513 kg)  BMI 42.72 kg/m2 ,  BMI Body mass index is 42.72 kg/(m^2). General:Pleasant affect, NAD Skin:Warm and dry, brisk capillary refill HEENT:normocephalic, sclera clear, mucus membranes moist Neck:supple, no JVD, no bruits  Heart:S1S2 RRR with 2/6 systolic murmur, no gallup, rub or click Lungs:clear without rales, rhonchi, or wheezes [id number], non tender, + BS, do not palpate liver spleen or masses Ext:no lower ext edema, boot on rt foot, 2+ radial pulses Neuro:alert and oriente X 3d, MAE, follows commands, + facial symmetry    EKG:  EKG is NOT ordered today. EKG from ER SR with no acute changes.   Recent Labs: 03/23/2014: B Natriuretic Peptide 107.0*; TSH 2.129 03/24/2014: ALT 25 01/29/2015: BUN 26*; Creatinine, Ser 0.82; Hemoglobin 14.6; Platelets 156; Potassium 5.0; Sodium 136    Lipid Panel    Component Value Date/Time   CHOL 169 05/19/2014 1510   TRIG * 05/19/2014 1510    555.0 Triglyceride is over 400; calculations on Lipids are invalid.   HDL 39.10 05/19/2014 1510   CHOLHDL 4 05/19/2014 1510   VLDL 35 09/04/2006 0315   LDLCALC * 09/04/2006 0315    140        Total Cholesterol/HDL:CHD Risk Coronary Heart Disease Risk Table                     Men   Women  1/2 Average Risk   3.4   3.3   LDLDIRECT 88.0 05/19/2014 1510       Other studies Reviewed: Additional studies/ records that were reviewed today include: previous notes, ER visit , . ECHO: Study Conclusions  - Left ventricle: The cavity size was normal. Wall thickness was increased in a pattern of mild LVH. The estimated ejection fraction was 70%. Wall motion was normal; there were no regional wall motion abnormalities. - Aortic valve: Moderate thickening/calcification. Moderately severe AS. - Mitral valve: MAC and leaflet thickening. Gradients and visual suggest  some mitral stenosis. - Left atrium: The atrium was moderately dilated. - Right atrium: The atrium was mildly dilated. - Pulmonary arteries: PA peak pressure: 35 mm Hg (S).   ASSESSMENT AND PLAN:  1.  Hypotension most likely leading to falls vs. Bradycardia on propranolol toprol and Cardizem.  Her propranolol has been stopped which I agree with.  If neuro feel she needs propranolol would stop the toprol  We also discussed an event monitor but for now will hold off unless falls continue - she has had bradycardia in the past.  2. Falls on anticoagulation we discussed, but pt and son agree to continue on Eliquis for now -if pt continues with falls will re-visit.    3. PAF now maintaining SR.  On Eliquis for anticoagulation  4. Aortic stenosis moderate, falls seem to mechanical, no syncope  5. Hyperlipidemia continue lipitor   6. Tremor.  Follow up with Dr. Marlou Porch in 2 months.    Current medicines are reviewed with the patient today.  The patient Has no concerns regarding medicines.  The following changes have been made:  See above Labs/ tests ordered today include:see above  Disposition:   FU:  see above  Lennie Muckle, NP  02/19/2015 11:39 AM    [facility] [place], [place], [place], [place] Phone: [phone]; Fax: [phone]

## 2015-02-20 ENCOUNTER — Encounter: Payer: Self-pay | Admitting: Cardiology

## 2015-04-19 ENCOUNTER — Other Ambulatory Visit: Payer: Self-pay | Admitting: Neurology

## 2015-04-20 ENCOUNTER — Ambulatory Visit (INDEPENDENT_AMBULATORY_CARE_PROVIDER_SITE_OTHER): Payer: Medicare Other | Admitting: Cardiology

## 2015-04-20 ENCOUNTER — Encounter: Payer: Self-pay | Admitting: Cardiology

## 2015-04-20 VITALS — BP 118/62 | HR 64 | Ht 60.0 in | Wt 225.0 lb

## 2015-04-20 DIAGNOSIS — Z79899 Other long term (current) drug therapy: Secondary | ICD-10-CM

## 2015-04-20 DIAGNOSIS — I350 Nonrheumatic aortic (valve) stenosis: Secondary | ICD-10-CM | POA: Diagnosis not present

## 2015-04-20 DIAGNOSIS — I480 Paroxysmal atrial fibrillation: Secondary | ICD-10-CM | POA: Diagnosis not present

## 2015-04-20 DIAGNOSIS — E7800 Pure hypercholesterolemia, unspecified: Secondary | ICD-10-CM | POA: Diagnosis not present

## 2015-04-20 NOTE — Patient Instructions (Signed)
Medication Instructions:  The current medical regimen is effective;  continue present plan and medications.  Testing/Procedures: Your physician has requested that you have an echocardiogram. Echocardiography is a painless test that uses sound waves to create images of your heart. It provides your doctor with information about the size and shape of your heart and how well your heart's chambers and valves are working. This procedure takes approximately one hour. There are no restrictions for this procedure.  Follow-Up: Follow up in 6 months with Dr. Skains.  You will receive a letter in the mail 2 months before you are due.  Please call us when you receive this letter to schedule your follow up appointment.  If you need a refill on your cardiac medications before your next appointment, please call your pharmacy.  Thank you for choosing [facility]!!       

## 2015-04-20 NOTE — Progress Notes (Signed)
[facility]. [address] [place], [place]  [address] Phone: [phone] Fax:  [phone]  Date:  04/20/2015   ID:  Kara Ortega, DOB 17-Jul-1929, MRN [id number]  PCP:  [name], MD   History of Present Illness: Kara Ortega is a 82 y.o. female who was hospitalized on 03/27/14 with acute kidney injury, atrial fibrillation , fall , weight gain, fluid overload paroxysmal atrial fibrillation   She had near syncope, fall and was bradycardic and hypotensive. No acute fractures.   She also had moderately severe aortic stenosis and possible mitral stenosis.  We changed her over from Coumadin to Eliquis. Ease of use. Her son was present for discussion. Discussed bleeding risks. Continue to encourage conditioning. Aortic stenosis is currently moderate. We will continue to monitor.  She has had no further falls. She uses a walker for ambulation. No bleeding episodes. She is wearing a brace on her right ankle.  Occasionally will feel a sharp fleeting chest discomfort in her mid chest.  Wt Readings from Last 3 Encounters:  04/20/15 225 lb (102.059 kg)  02/19/15 226 lb (102.513 kg)  12/29/14 230 lb (104.327 kg)     Past Medical History  Diagnosis Date  . Abnormal heart rhythms   . Diabetes ([place])     insulin dependent  . Sleep apnea     uses bipap X12 years.  Marland Kitchen COPD (chronic obstructive pulmonary disease) ([place])   . CHF (congestive heart failure) ([place])   . A-fib ([place])   . Anginal pain ([place])   . Hypertension   . Stroke ([place])     LEFT SIDE RESIDUAL  . Shortness of breath   . Depression   . Chronic kidney disease     STAGE 3   . Memory loss   . Alzheimer disease   . Leg pain   . Back pain   . Neuropathy [facility])     Past Surgical History  Procedure Laterality Date  . Breast surgery      X4  . Colon surgery      removed 14 inches of colon  . Hernia repair    . Breast lumpectomy      Current Outpatient Prescriptions  Medication Sig Dispense Refill   . ALPRAZolam (XANAX) 0.25 MG tablet Take 0.25 mg by mouth at bedtime.     Marland Kitchen apixaban (ELIQUIS) 5 MG TABS tablet Take 1 tablet (5 mg total) by mouth 2 (two) times daily. 180 tablet 3  . atorvastatin (LIPITOR) 10 MG tablet Take 10 mg by mouth at bedtime.     Marland Kitchen b complex vitamins tablet Take 1 tablet by mouth daily.    . B-D UF III MINI PEN NEEDLES 31G X 5 MM MISC U UTD TO INJECT INSULIN  5  . Cholecalciferol (HM VITAMIN D3) 4000 UNITS CAPS Take 4,000 Units by mouth daily.     . citalopram (CELEXA) 20 MG tablet Take 20 mg by mouth daily.    . colchicine 0.6 MG tablet Take 0.6 mg by mouth daily.     . diclofenac sodium (VOLTAREN) 1 % GEL Apply 4 g topically 4 (four) times daily as needed (For pain.).     Marland Kitchen diltiazem (TIAZAC) 240 MG 24 hr capsule Take 240 mg by mouth daily.    . febuxostat (ULORIC) 40 MG tablet Take 40 mg by mouth daily.    . fenofibrate micronized (LOFIBRA) 134 MG capsule Take 134 mg by mouth daily.    . furosemide (  LASIX) 40 MG tablet Take 40 mg by mouth daily.     Marland Kitchen HYDROcodone-acetaminophen (NORCO) 5-325 MG tablet Take 1 tablet by mouth every 6 (six) hours as needed for severe pain. 20 tablet 0  . isosorbide mononitrate (IMDUR) 30 MG 24 hr tablet Take 60 mg by mouth daily.     . Liraglutide (VICTOZA) 18 MG/3ML SOPN Inject 1.2 mg into the skin daily.     Marland Kitchen losartan (COZAAR) 25 MG tablet Take 25 mg by mouth daily.    Marland Kitchen LYRICA 100 MG capsule Take 100-200 mg by mouth 2 (two) times daily. She takes one capsule in the morning and two capsules at bedtime.  0  . metoprolol succinate (TOPROL-XL) 25 MG 24 hr tablet Take 25 mg by mouth 2 (two) times daily.     . Multiple Vitamins-Minerals (MULTIVITAMIN WITH MINERALS) tablet Take 1 tablet by mouth daily.    . nitroGLYCERIN (NITROSTAT) 0.4 MG SL tablet Place 1 tablet (0.4 mg total) under the tongue every 5 (five) minutes as needed for chest pain. 25 tablet prn  . potassium chloride (KLOR-CON) 20 MEQ packet Take 40 mEq by mouth 2 (two) times  daily.     . propranolol ER (INDERAL LA) 60 MG 24 hr capsule TAKE 1 CAPSULE(60 MG) BY MOUTH DAILY 90 capsule 1  . Rivastigmine 13.3 MG/24HR PT24 PLACE 1 PATCH ON SKIN AND CHANGE EVERY 24 HOURS 30 patch 5  . spironolactone (ALDACTONE) 25 MG tablet Take 25 mg by mouth 2 (two) times daily.     Nelva Nay SOLOSTAR 300 UNIT/ML SOPN Inject 80 mg into the skin at bedtime.   5  . zolpidem (AMBIEN) 5 MG tablet Take 5 mg by mouth at bedtime as needed for sleep.    . pantoprazole (PROTONIX) 40 MG tablet Take 40 mg by mouth daily. Reported on 04/20/2015     No current facility-administered medications for this visit.    Allergies:    Allergies  Allergen Reactions  . Avandia [Rosiglitazone] Other (See Comments)    edema  . Benazepril Other (See Comments)    unknown  . Dilaudid [Hydromorphone Hcl] Other (See Comments)    "does not tolerate strong pain medications" per pt  . Fosamax [Alendronate Sodium] Nausea And Vomiting  . Talwin [Pentazocine] Nausea And Vomiting  . Tetanus Toxoids Other (See Comments)    "allergic to something in the tetanus injection" per patient  . Zyrtec [Cetirizine] Other (See Comments)    unknown    Social History:  The patient  reports that she quit smoking about 47 years ago. Her smoking use included Cigarettes. She has a 27 pack-year smoking history. She has never used smokeless tobacco. She reports that she does not drink alcohol or use illicit drugs.   Family History  Problem Relation Age of Onset  . Heart disease Mother   . Heart disease Sister   . Alzheimer's disease Sister   . Dementia Maternal Grandmother   . Dementia Maternal Aunt   . Mental illness Other     ROS:  Please see the history of present illness.    Denies any further syncopal episodes, no bleeding episodes , no chest pain. Baseline shortness of breath/deconditioning noted. Baseline memory impairment.   All other systems reviewed and negative.   PHYSICAL EXAM: VS:  BP 118/62 mmHg  Pulse 64  Ht  5' (1.524 m)  Wt 225 lb (102.059 kg)  BMI 43.94 kg/m2 Well nourished, well developed, in no acute distress , in  wheelchair HEENT: normal, [place]/AT, EOMI Neck: no JVD, normal carotid upstroke, no bruit Cardiac:  normal S1, S2; RRR;  2/6 systolic upper sternal border murmur Lungs:  clear to auscultation bilaterally, no wheezing, rhonchi or rales Abd: soft, nontender, no hepatomegaly, no bruitsobese Ext: no edema, 2+ distal pulses Skin: warm and dry GU: deferred Neuro: no focal abnormalities noted, AAO x 3  EKG:   None today     ASSESSMENT AND PLAN:  Paroxysmal atrial fibrillation -we will continue with anticoagulation Eliquis 5 mg twice a day. She has a several year, remote history of GI bleed. This was during the [place] war. She understands the bleeding risks involved. She does utilize a walker and she is at a fall risk.   Aortic stenosis - Previously moderate to severe in December 2015. - Repeat echocardiogram  Hyperlipidemia -continue with atorvastatin 10 mg.  - When she is off of her fibrate, her triglycerides are 555. She is back on.  Obesity -encourage weight loss.  Diabetes -per Dr. Tamala Julian.  Chronic anticoagulation -Eliquis. - Watching basic metabolic profile and CBC  Prior syncope  -fall in the shower after she took nitroglycerin. She occasionally will have sharp fleeting chest pain that sometimes wakes her up from sleep. I instructed her that this is likely musculoskeletal and noncardiac and she should use her nitroglycerin sparingly. aortic stenosis - moderate , we will continue to monitor. No longer on propanolol  See back in 6 months  Signed, Candee Furbish, MD [facility]  04/20/2015 1:51 PM

## 2015-04-27 ENCOUNTER — Encounter: Payer: Self-pay | Admitting: Neurology

## 2015-04-27 ENCOUNTER — Ambulatory Visit (INDEPENDENT_AMBULATORY_CARE_PROVIDER_SITE_OTHER): Payer: Medicare Other | Admitting: Neurology

## 2015-04-27 VITALS — BP 136/64 | HR 66 | Ht 60.0 in | Wt 223.0 lb

## 2015-04-27 DIAGNOSIS — F0280 Dementia in other diseases classified elsewhere without behavioral disturbance: Secondary | ICD-10-CM

## 2015-04-27 DIAGNOSIS — G309 Alzheimer's disease, unspecified: Secondary | ICD-10-CM

## 2015-04-27 DIAGNOSIS — G250 Essential tremor: Secondary | ICD-10-CM | POA: Diagnosis not present

## 2015-04-27 MED ORDER — RIVASTIGMINE 13.3 MG/24HR TD PT24: 13.3000 mg | MEDICATED_PATCH | TRANSDERMAL | Status: DC

## 2015-04-27 NOTE — Progress Notes (Signed)
NEUROLOGY FOLLOW UP OFFICE NOTE  Kellan Deshotel [id number]  HISTORY OF PRESENT ILLNESS: Kara Ortega is an 82 year old right-handed woman with history of a-fib (on [facility]), CHF, unstable angina, hypertension, fibromyalgia, sleep apnea, basal cell carcinoma, benign essential tremor, hypothyroidism, hyperlipidemia, and type II diabetes with neuropathy who follows up for essential tremor, Alzheimer's disease.  She is accompanied by her son, who provides some history.  UPDATE: Alzheimer's disease:  She takes Exelon 9.5mg  patch.   She is tolerating it.  She feels her memory is a little better..  Essential tremor:  After discussing with her cardiologist, it appears Eliquis is only contraindicated with strong CYP3A4 and P-gp inhibitors and inducers.  Although primidone is only a CYP3A4 inducer, there may still be concern that it would lower efficacy of Eliquis.  She is on low dose metoprolol 25mg , so low dose propranolol seemed okay to try.  However, when she was started on propranolol 60mg , she became hypotensive and bradycardic, causing falls, thus propranolol was discontinued.  HISTORY: She began having memory problems several years ago, while living in [place], [place], however it has more noticeably progressed over the past 1 to 2 years.  She moved to [place] last year.  It started with short-term memory problems.  She will forget content of conversation.  She would repeat herself frequently.  She frequently repeats questions to reconfirm things.  She requires help with administering her medications.  Her children set up her medications for the week in a pillbox.  She usually takes it correctly, but infrequently, she will miss a dose, usually because she is confused about the day of the week, even though it is written on the box.  She has problems with math and gets confused calculating the amount of insulin she uses.  She has left the stove on in the past.  She usually has meals prepared by her  children or she gets [place] take-out.  She will use the stove occasionally to cook soup.  She now finds that she will forget names of acquaintances, as well as some family such as her sister.  She seldom drives, usually only to the [employer] which is behind her house.  [place]2 a [place].  She eats breakfast, but will typically graze the rest of the day.  She is able to perform her ADLs and keeps the house clean and does the laundry.  She does have difficulty with some simple tasks, such as operating the washer and dryer.  She no longer handles her finances or pays the bills (her son does it).  She lives by herself.  In the past, she used to see people in the house who weren't there, but this hasn't happened for at least a year.  She does not exhibit delusions.  She denies depression.  She does have difficulty sleeping at times.  She goes to bed around midnight and wakes up about 1.5 hours later.  She will keep herself occupied by cooking soup until about 4 am.  She will watch TV and drift in and out of sleep until getting up for the day around 11am.  She often spends most of the day sitting and watching TV.  She takes Exelon 9.5mg  patch.  Her sister has Alzheimer's.  Her maternal grandmother and aunt had dementia.  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  She has a master's degree in administration and supervision.  She also has recurrent falls.  He has had gait instability for many years due to imbalance.  Over the past year, she has had some falls.  Often she says her left knee gives out.  She also reports pain in the left knee.  She has some known mild weakness in the left leg and was told by her former neurologist that she may have had a small stroke  PAST MEDICAL HISTORY: Past Medical History  Diagnosis Date  . Abnormal heart rhythms   .  Diabetes ([place])     insulin dependent  . Sleep apnea     uses bipap X12 years.  Marland Kitchen COPD (chronic obstructive pulmonary disease) ([place])   . CHF (congestive heart failure) ([place])   . A-fib ([place])   . Anginal pain ([place])   . Hypertension   . Stroke ([place])     LEFT SIDE RESIDUAL  . Shortness of breath   . Depression   . Chronic kidney disease     STAGE 3   . Memory loss   . Alzheimer disease   . Leg pain   . Back pain   . Neuropathy [facility])     MEDICATIONS: Current Outpatient Prescriptions on File Prior to Visit  Medication Sig Dispense Refill  . ALPRAZolam (XANAX) 0.25 MG tablet Take 0.25 mg by mouth at bedtime.     Marland Kitchen apixaban (ELIQUIS) 5 MG TABS tablet Take 1 tablet (5 mg total) by mouth 2 (two) times daily. 180 tablet 3  . atorvastatin (LIPITOR) 10 MG tablet Take 10 mg by mouth at bedtime.     Marland Kitchen b complex vitamins tablet Take 1 tablet by mouth daily.    . B-D UF III MINI PEN NEEDLES 31G X 5 MM MISC U UTD TO INJECT INSULIN  5  . Cholecalciferol (HM VITAMIN D3) 4000 UNITS CAPS Take 4,000 Units by mouth daily.     . citalopram (CELEXA) 20 MG tablet Take 20 mg by mouth daily.    . colchicine 0.6 MG tablet Take 0.6 mg by mouth daily.     . diclofenac sodium (VOLTAREN) 1 % GEL Apply 4 g topically 4 (four) times daily as needed (For pain.).     Marland Kitchen diltiazem (TIAZAC) 240 MG 24 hr capsule Take 240 mg by mouth daily.    . febuxostat (ULORIC) 40 MG tablet Take 40 mg by mouth daily.    . fenofibrate micronized (LOFIBRA) 134 MG capsule Take 134 mg by mouth daily.    . furosemide (LASIX) 40 MG tablet Take 40 mg by mouth daily.     Marland Kitchen HYDROcodone-acetaminophen (NORCO) 5-325 MG tablet Take 1 tablet by mouth every 6 (six) hours as needed for severe pain. 20 tablet 0  . isosorbide mononitrate (IMDUR) 30 MG 24 hr tablet Take 60 mg by mouth daily.     . Liraglutide (VICTOZA) 18 MG/3ML SOPN Inject 1.2 mg into the skin daily.     Marland Kitchen losartan (COZAAR) 25 MG tablet Take 25 mg by mouth daily.    Marland Kitchen LYRICA 100 MG  capsule Take 100-200 mg by mouth 2 (two) times daily. She takes one capsule in the morning and two capsules at bedtime.  0  . metoprolol succinate (TOPROL-XL) 25 MG 24 hr tablet Take 25 mg by mouth 2 (two) times daily.     . Multiple Vitamins-Minerals (MULTIVITAMIN WITH MINERALS) tablet Take 1 tablet by mouth daily.    Marland Kitchen  nitroGLYCERIN (NITROSTAT) 0.4 MG SL tablet Place 1 tablet (0.4 mg total) under the tongue every 5 (five) minutes as needed for chest pain. 25 tablet prn  . pantoprazole (PROTONIX) 40 MG tablet Take 40 mg by mouth daily. Reported on 04/20/2015    . potassium chloride (KLOR-CON) 20 MEQ packet Take 40 mEq by mouth 2 (two) times daily.     . propranolol ER (INDERAL LA) 60 MG 24 hr capsule TAKE 1 CAPSULE(60 MG) BY MOUTH DAILY 90 capsule 1  . spironolactone (ALDACTONE) 25 MG tablet Take 25 mg by mouth 2 (two) times daily.     Nelva Nay SOLOSTAR 300 UNIT/ML SOPN Inject 80 mg into the skin at bedtime.   5  . zolpidem (AMBIEN) 5 MG tablet Take 5 mg by mouth at bedtime as needed for sleep.     No current facility-administered medications on file prior to visit.    ALLERGIES: Allergies  Allergen Reactions  . Avandia [Rosiglitazone] Other (See Comments)    edema  . Benazepril Other (See Comments)    unknown  . Dilaudid [Hydromorphone Hcl] Other (See Comments)    "does not tolerate strong pain medications" per pt  . Fosamax [Alendronate Sodium] Nausea And Vomiting  . Talwin [Pentazocine] Nausea And Vomiting  . Tetanus Toxoids Other (See Comments)    "allergic to something in the tetanus injection" per patient  . Zyrtec [Cetirizine] Other (See Comments)    unknown    FAMILY HISTORY: Family History  Problem Relation Age of Onset  . Heart disease Mother   . Heart disease Sister   . Alzheimer's disease Sister   . Dementia Maternal Grandmother   . Dementia Maternal Aunt   . Mental illness Other     SOCIAL HISTORY: Social History   Social History  . Marital Status: Single     Spouse Name: N/A  . Number of Children: 3  . Years of Education: N/A   Occupational History  . retired    Social History Main Topics  . Smoking status: Former Smoker -- 1.50 packs/day for 18 years    Types: Cigarettes    Quit date: 03/31/1968  . Smokeless tobacco: Never Used  . Alcohol Use: No  . Drug Use: No  . Sexual Activity: No   Other Topics Concern  . Not on file   Social History Narrative    REVIEW OF SYSTEMS: Constitutional: No fevers, chills, or sweats, no generalized fatigue, change in appetite Eyes: No visual changes, double vision, eye pain Ear, nose and throat: No hearing loss, ear pain, nasal congestion, sore throat Cardiovascular: No chest pain, palpitations Respiratory:  No shortness of breath at rest or with exertion, wheezes GastrointestinaI: No nausea, vomiting, diarrhea, abdominal pain, fecal incontinence Genitourinary:  No dysuria, urinary retention or frequency Musculoskeletal:  No neck pain, back pain Integumentary: No rash, pruritus, skin lesions Neurological: as above Psychiatric: No depression, insomnia, anxiety Endocrine: No palpitations, fatigue, diaphoresis, mood swings, change in appetite, change in weight, increased thirst Hematologic/Lymphatic:  No anemia, purpura, petechiae. Allergic/Immunologic: no itchy/runny eyes, nasal congestion, recent allergic reactions, rashes  PHYSICAL EXAM: Filed Vitals:   04/27/15 1533  BP: 136/64  Pulse: 66   General: No acute distress.  Patient appears well-groomed.   Head:  Normocephalic/atraumatic Eyes:  Fundoscopic exam unremarkable without vessel changes, exudates, hemorrhages or papilledema. Neck: supple, no paraspinal tenderness, full range of motion Heart:  Regular rate and rhythm Lungs:  Clear to auscultation bilaterally Back: No paraspinal tenderness Neurological Exam: alert and  oriented to person, place, and time (except she said 2016 instead of 2017). Attention span and concentration intact,  recent and remote memory intact, fund of knowledge intact.  Speech fluent and not dysarthric, language intact.  CN II-XII intact. Fundoscopic exam unremarkable without vessel changes, exudates, hemorrhages or papilledema.  Bulk and tone normal, muscle strength 5/5 throughout.  Sensation to light touch intact.  Deep tendon reflexes 2+ throughout.  Finger to nose testing revealed postural and kinetic tremor.  Wide based gait.  Unsteady  IMPRESSION: Alzheimer's dementia Essential tremor  PLAN: Increase Exelon patch to 13.3mg  daily after she finishes this prescription Will discuss with Dr. Marlou Porch about switching from metoprolol to propranolol, as propranolol is really the only beta blocker that seems effective for tremor. Follow up in 6 months.  27 minutes spent face to face with patient, over 50% spent discussing management.  Metta Clines, DO  CC:  Carol Ada, MD  Candee Furbish, MD

## 2015-04-27 NOTE — Patient Instructions (Signed)
1.  Finish the current Exelon patch.  When you finish, start the 13.3mg  patch every 24 hours 2.  Will contact Dr. Marlou Porch about switching metoprolol to propranolol 3.  Follow up in 6 months

## 2015-05-04 ENCOUNTER — Ambulatory Visit (HOSPITAL_COMMUNITY): Payer: Medicare Other | Attending: Cardiology

## 2015-05-04 ENCOUNTER — Other Ambulatory Visit: Payer: Self-pay

## 2015-05-04 DIAGNOSIS — G4733 Obstructive sleep apnea (adult) (pediatric): Secondary | ICD-10-CM | POA: Diagnosis not present

## 2015-05-04 DIAGNOSIS — I10 Essential (primary) hypertension: Secondary | ICD-10-CM | POA: Insufficient documentation

## 2015-05-04 DIAGNOSIS — E119 Type 2 diabetes mellitus without complications: Secondary | ICD-10-CM | POA: Diagnosis not present

## 2015-05-04 DIAGNOSIS — I059 Rheumatic mitral valve disease, unspecified: Secondary | ICD-10-CM | POA: Insufficient documentation

## 2015-05-04 DIAGNOSIS — I350 Nonrheumatic aortic (valve) stenosis: Secondary | ICD-10-CM | POA: Diagnosis not present

## 2015-05-04 DIAGNOSIS — E785 Hyperlipidemia, unspecified: Secondary | ICD-10-CM | POA: Diagnosis not present

## 2015-05-04 DIAGNOSIS — I517 Cardiomegaly: Secondary | ICD-10-CM | POA: Diagnosis not present

## 2015-05-30 DIAGNOSIS — G4733 Obstructive sleep apnea (adult) (pediatric): Secondary | ICD-10-CM | POA: Diagnosis not present

## 2015-06-01 DIAGNOSIS — R7989 Other specified abnormal findings of blood chemistry: Secondary | ICD-10-CM | POA: Diagnosis not present

## 2015-06-01 DIAGNOSIS — Z5181 Encounter for therapeutic drug level monitoring: Secondary | ICD-10-CM | POA: Diagnosis not present

## 2015-06-01 DIAGNOSIS — E1142 Type 2 diabetes mellitus with diabetic polyneuropathy: Secondary | ICD-10-CM | POA: Diagnosis not present

## 2015-06-01 DIAGNOSIS — N183 Chronic kidney disease, stage 3 (moderate): Secondary | ICD-10-CM | POA: Diagnosis not present

## 2015-06-01 DIAGNOSIS — E559 Vitamin D deficiency, unspecified: Secondary | ICD-10-CM | POA: Diagnosis not present

## 2015-06-01 DIAGNOSIS — Z794 Long term (current) use of insulin: Secondary | ICD-10-CM | POA: Diagnosis not present

## 2015-06-02 DIAGNOSIS — M25571 Pain in right ankle and joints of right foot: Secondary | ICD-10-CM | POA: Diagnosis not present

## 2015-06-29 DIAGNOSIS — Z0100 Encounter for examination of eyes and vision without abnormal findings: Secondary | ICD-10-CM | POA: Diagnosis not present

## 2015-06-29 DIAGNOSIS — G4733 Obstructive sleep apnea (adult) (pediatric): Secondary | ICD-10-CM | POA: Diagnosis not present

## 2015-07-10 ENCOUNTER — Other Ambulatory Visit: Payer: Self-pay

## 2015-07-10 DIAGNOSIS — Z1231 Encounter for screening mammogram for malignant neoplasm of breast: Secondary | ICD-10-CM

## 2015-07-26 DIAGNOSIS — G4733 Obstructive sleep apnea (adult) (pediatric): Secondary | ICD-10-CM | POA: Diagnosis not present

## 2015-08-07 DIAGNOSIS — M545 Low back pain: Secondary | ICD-10-CM | POA: Diagnosis not present

## 2015-08-09 ENCOUNTER — Other Ambulatory Visit: Payer: Self-pay | Admitting: Orthopedic Surgery

## 2015-08-09 DIAGNOSIS — M533 Sacrococcygeal disorders, not elsewhere classified: Secondary | ICD-10-CM

## 2015-08-16 DIAGNOSIS — L820 Inflamed seborrheic keratosis: Secondary | ICD-10-CM | POA: Diagnosis not present

## 2015-08-16 DIAGNOSIS — Z85828 Personal history of other malignant neoplasm of skin: Secondary | ICD-10-CM | POA: Diagnosis not present

## 2015-08-16 DIAGNOSIS — L720 Epidermal cyst: Secondary | ICD-10-CM | POA: Diagnosis not present

## 2015-09-27 DIAGNOSIS — G4733 Obstructive sleep apnea (adult) (pediatric): Secondary | ICD-10-CM | POA: Diagnosis not present

## 2015-09-28 DIAGNOSIS — Z794 Long term (current) use of insulin: Secondary | ICD-10-CM | POA: Diagnosis not present

## 2015-09-28 DIAGNOSIS — E1142 Type 2 diabetes mellitus with diabetic polyneuropathy: Secondary | ICD-10-CM | POA: Diagnosis not present

## 2015-09-28 DIAGNOSIS — N183 Chronic kidney disease, stage 3 (moderate): Secondary | ICD-10-CM | POA: Diagnosis not present

## 2015-09-28 DIAGNOSIS — Z5181 Encounter for therapeutic drug level monitoring: Secondary | ICD-10-CM | POA: Diagnosis not present

## 2015-10-09 DIAGNOSIS — E1140 Type 2 diabetes mellitus with diabetic neuropathy, unspecified: Secondary | ICD-10-CM | POA: Diagnosis not present

## 2015-10-09 DIAGNOSIS — R413 Other amnesia: Secondary | ICD-10-CM | POA: Diagnosis not present

## 2015-10-09 DIAGNOSIS — F5101 Primary insomnia: Secondary | ICD-10-CM | POA: Diagnosis not present

## 2015-10-09 DIAGNOSIS — I2510 Atherosclerotic heart disease of native coronary artery without angina pectoris: Secondary | ICD-10-CM | POA: Diagnosis not present

## 2015-10-09 DIAGNOSIS — I5032 Chronic diastolic (congestive) heart failure: Secondary | ICD-10-CM | POA: Diagnosis not present

## 2015-10-09 DIAGNOSIS — E781 Pure hyperglyceridemia: Secondary | ICD-10-CM | POA: Diagnosis not present

## 2015-10-09 DIAGNOSIS — R600 Localized edema: Secondary | ICD-10-CM | POA: Diagnosis not present

## 2015-10-09 DIAGNOSIS — F329 Major depressive disorder, single episode, unspecified: Secondary | ICD-10-CM | POA: Diagnosis not present

## 2015-10-09 DIAGNOSIS — N183 Chronic kidney disease, stage 3 (moderate): Secondary | ICD-10-CM | POA: Diagnosis not present

## 2015-10-09 DIAGNOSIS — E782 Mixed hyperlipidemia: Secondary | ICD-10-CM | POA: Diagnosis not present

## 2015-10-12 ENCOUNTER — Other Ambulatory Visit: Payer: Self-pay | Admitting: Neurology

## 2015-10-24 DIAGNOSIS — G4733 Obstructive sleep apnea (adult) (pediatric): Secondary | ICD-10-CM | POA: Diagnosis not present

## 2015-10-26 ENCOUNTER — Ambulatory Visit: Payer: Medicare Other | Admitting: Neurology

## 2015-11-02 ENCOUNTER — Ambulatory Visit (INDEPENDENT_AMBULATORY_CARE_PROVIDER_SITE_OTHER): Payer: Medicare Other | Admitting: Neurology

## 2015-11-02 ENCOUNTER — Encounter: Payer: Self-pay | Admitting: Neurology

## 2015-11-02 VITALS — BP 122/62 | HR 69 | Ht 60.0 in | Wt 224.0 lb

## 2015-11-02 DIAGNOSIS — F0280 Dementia in other diseases classified elsewhere without behavioral disturbance: Secondary | ICD-10-CM

## 2015-11-02 DIAGNOSIS — I8311 Varicose veins of right lower extremity with inflammation: Secondary | ICD-10-CM | POA: Diagnosis not present

## 2015-11-02 DIAGNOSIS — I8312 Varicose veins of left lower extremity with inflammation: Secondary | ICD-10-CM | POA: Diagnosis not present

## 2015-11-02 DIAGNOSIS — L820 Inflamed seborrheic keratosis: Secondary | ICD-10-CM | POA: Diagnosis not present

## 2015-11-02 DIAGNOSIS — I872 Venous insufficiency (chronic) (peripheral): Secondary | ICD-10-CM | POA: Diagnosis not present

## 2015-11-02 DIAGNOSIS — R251 Tremor, unspecified: Secondary | ICD-10-CM

## 2015-11-02 DIAGNOSIS — L821 Other seborrheic keratosis: Secondary | ICD-10-CM | POA: Diagnosis not present

## 2015-11-02 DIAGNOSIS — Z85828 Personal history of other malignant neoplasm of skin: Secondary | ICD-10-CM | POA: Diagnosis not present

## 2015-11-02 DIAGNOSIS — G309 Alzheimer's disease, unspecified: Secondary | ICD-10-CM

## 2015-11-02 DIAGNOSIS — L720 Epidermal cyst: Secondary | ICD-10-CM | POA: Diagnosis not present

## 2015-11-02 DIAGNOSIS — D1801 Hemangioma of skin and subcutaneous tissue: Secondary | ICD-10-CM | POA: Diagnosis not present

## 2015-11-02 MED ORDER — MEMANTINE HCL 28 X 5 MG & 21 X 10 MG PO TABS: ORAL_TABLET | ORAL | 12 refills | Status: DC

## 2015-11-02 NOTE — Addendum Note (Signed)
Addended by: Gerda Diss A on: 11/02/2015 02:17 PM   Modules accepted: Orders

## 2015-11-02 NOTE — Progress Notes (Signed)
Chart forwarded.  

## 2015-11-02 NOTE — Progress Notes (Signed)
NEUROLOGY FOLLOW UP OFFICE NOTE  Kara Ortega EL:[id number]  HISTORY OF PRESENT ILLNESS: Kara Ortega is an 82 year old right-handed woman with history of a-fib (on [facility]), CHF, unstable angina, hypertension, fibromyalgia, sleep apnea, basal cell carcinoma, benign essential tremor, hypothyroidism, hyperlipidemia, and type II diabetes with neuropathy who follows up for essential tremor, Alzheimer's disease.  She is accompanied by her son, who provides some history.   UPDATE: Alzheimer's disease:  She takes Exelon 13.3mg  patch.   She is tolerating it.  She feels her memory is getting worse.  She and her son are looking into moving to Assisted Living.  She uses the [employer] [facility] for transportation.  Tremor:  Still an issue.  She cannot use a fork or spoon.     HISTORY: She began having memory problems several years ago, while living in [place], [place], however it has more noticeably progressed over the past 1 to 2 years.  She moved to [place] last year.  It started with short-term memory problems.  She will forget content of conversation.  She would repeat herself frequently.  She frequently repeats questions to reconfirm things.  She requires help with administering her medications.  Her children set up her medications for the week in a pillbox.  She usually takes it correctly, but infrequently, she will miss a dose, usually because she is confused about the day of the week, even though it is written on the box.  She has problems with math and gets confused calculating the amount of insulin she uses.  She has left the stove on in the past.  She usually has meals prepared by her children or she gets [place] take-out.  She will use the stove occasionally to cook soup.  She now finds that she will forget names of acquaintances, as well as some family such as her sister.  She seldom drives, usually only to the [employer] which is behind her house.  [place] [facility] [place] 1/2 a mile away.  She eats breakfast, but will typically graze the rest of the day.  She is able to perform her ADLs and keeps the house clean and does the laundry.  She does have difficulty with some simple tasks, such as operating the washer and dryer.  She no longer handles her finances or pays the bills (her son does it).  She lives by herself.  In the past, she used to see people in the house who weren't there, but this hasn't happened for at least a year.  She does not exhibit delusions.  She denies depression.  She does have difficulty sleeping at times.  She goes to bed around midnight and wakes up about 1.5 hours later.  She will keep herself occupied by cooking soup until about 4 am.  She will watch TV and drift in and out of sleep until getting up for the day around 11am.  She often spends most of the day sitting and watching TV.  She takes Exelon 9.5mg  patch.   Her sister has Alzheimer's.  Her maternal grandmother and aunt had dementia.  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  She has a master's degree in administration and supervision.   She also has recurrent falls.  He has had gait instability for many years due to imbalance.  Over the past year, she has  had some falls.  Often she says her left knee gives out.  She also reports pain in the left knee.  She has some known mild weakness in the left leg and was told by her former neurologist that she may have had a small stroke  She has essential tremor:  After discussing with her cardiologist, it appears Eliquis is only contraindicated with strong CYP3A4 and P-gp inhibitors and inducers.  Although primidone is only a CYP3A4 inducer, there may still be concern that it would lower efficacy of Eliquis.  She is on low dose metoprolol 25mg , so low dose propranolol seemed okay to try.  However, when she was started on propranolol 60mg , she became  hypotensive and bradycardic, causing falls, thus propranolol was discontinued.  PAST MEDICAL HISTORY: Past Medical History:  Diagnosis Date  . A-fib ([place])   . Abnormal heart rhythms   . Alzheimer disease   . Anginal pain ([place])   . Back pain   . CHF (congestive heart failure) ([place])   . Chronic kidney disease    STAGE 3   . COPD (chronic obstructive pulmonary disease) ([place])   . Depression   . Diabetes ([place])    insulin dependent  . Hypertension   . Leg pain   . Memory loss   . Neuropathy ([place])   . Shortness of breath   . Sleep apnea    uses bipap X12 years.  . Stroke [facility])    LEFT SIDE RESIDUAL    MEDICATIONS: Current Outpatient Prescriptions on File Prior to Visit  Medication Sig Dispense Refill  . ALPRAZolam (XANAX) 0.25 MG tablet Take 0.25 mg by mouth at bedtime.     Marland Kitchen apixaban (ELIQUIS) 5 MG TABS tablet Take 1 tablet (5 mg total) by mouth 2 (two) times daily. 180 tablet 3  . atorvastatin (LIPITOR) 10 MG tablet Take 10 mg by mouth at bedtime.     Marland Kitchen b complex vitamins tablet Take 1 tablet by mouth daily.    . B-D UF III MINI PEN NEEDLES 31G X 5 MM MISC U UTD TO INJECT INSULIN  5  . Cholecalciferol (HM VITAMIN D3) 4000 UNITS CAPS Take 4,000 Units by mouth daily.     . citalopram (CELEXA) 20 MG tablet Take 20 mg by mouth daily.    . colchicine 0.6 MG tablet Take 0.6 mg by mouth daily.     . diclofenac sodium (VOLTAREN) 1 % GEL Apply 4 g topically 4 (four) times daily as needed (For pain.).     Marland Kitchen diltiazem (TIAZAC) 240 MG 24 hr capsule Take 240 mg by mouth daily.    . febuxostat (ULORIC) 40 MG tablet Take 40 mg by mouth daily.    . fenofibrate micronized (LOFIBRA) 134 MG capsule Take 134 mg by mouth daily.    . furosemide (LASIX) 40 MG tablet Take 40 mg by mouth daily.     Marland Kitchen HYDROcodone-acetaminophen (NORCO) 5-325 MG tablet Take 1 tablet by mouth every 6 (six) hours as needed for severe pain. 20 tablet 0  . isosorbide mononitrate (IMDUR) 30 MG 24 hr tablet Take 60 mg by mouth  daily.     . Liraglutide (VICTOZA) 18 MG/3ML SOPN Inject 1.2 mg into the skin daily.     Marland Kitchen losartan (COZAAR) 25 MG tablet Take 25 mg by mouth daily.    Marland Kitchen LYRICA 100 MG capsule Take 100-200 mg by mouth 2 (two) times daily. She takes one capsule in the morning and two capsules at bedtime.  0  . metoprolol succinate (TOPROL-XL) 25 MG 24 hr tablet Take 25 mg by mouth 2 (two) times daily.     . Multiple Vitamins-Minerals (MULTIVITAMIN WITH MINERALS) tablet Take 1 tablet by mouth daily.    . nitroGLYCERIN (NITROSTAT) 0.4 MG SL tablet Place 1 tablet (0.4 mg total) under the tongue every 5 (five) minutes as needed for chest pain. 25 tablet prn  . pantoprazole (PROTONIX) 40 MG tablet Take 40 mg by mouth daily. Reported on 04/20/2015    . potassium chloride (KLOR-CON) 20 MEQ packet Take 40 mEq by mouth 2 (two) times daily.     . propranolol ER (INDERAL LA) 60 MG 24 hr capsule TAKE 1 CAPSULE(60 MG) BY MOUTH DAILY 90 capsule 1  . Rivastigmine 13.3 MG/24HR PT24 Place 1 patch (13.3 mg total) onto the skin daily. 30 patch 0  . Rivastigmine 13.3 MG/24HR PT24 PLACE 1 PATCH ON SKIN AND CHANGE EVERY 24 HOURS 30 patch 0  . spironolactone (ALDACTONE) 25 MG tablet Take 25 mg by mouth 2 (two) times daily.     Nelva Nay SOLOSTAR 300 UNIT/ML SOPN Inject 80 mg into the skin at bedtime.   5  . zolpidem (AMBIEN) 5 MG tablet Take 5 mg by mouth at bedtime as needed for sleep.     No current facility-administered medications on file prior to visit.     ALLERGIES: Allergies  Allergen Reactions  . Avandia [Rosiglitazone] Other (See Comments)    edema  . Benazepril Other (See Comments)    unknown  . Dilaudid [Hydromorphone Hcl] Other (See Comments)    "does not tolerate strong pain medications" per pt  . Fosamax [Alendronate Sodium] Nausea And Vomiting  . Talwin [Pentazocine] Nausea And Vomiting  . Tetanus Toxoids Other (See Comments)    "allergic to something in the tetanus injection" per patient  . Zyrtec [Cetirizine]  Other (See Comments)    unknown    FAMILY HISTORY: Family History  Problem Relation Age of Onset  . Dementia Maternal Grandmother   . Heart disease Mother   . Heart disease Sister   . Alzheimer's disease Sister   . Dementia Maternal Aunt   . Mental illness Other     SOCIAL HISTORY: Social History   Social History  . Marital status: Single    Spouse name: N/A  . Number of children: 3  . Years of education: N/A   Occupational History  . retired    Social History Main Topics  . Smoking status: Former Smoker    Packs/day: 1.50    Years: 18.00    Types: Cigarettes    Quit date: 03/31/1968  . Smokeless tobacco: Never Used  . Alcohol use No  . Drug use: No  . Sexual activity: No   Other Topics Concern  . Not on file   Social History Narrative  . No narrative on file    REVIEW OF SYSTEMS: Constitutional: No fevers, chills, or sweats, no generalized fatigue, change in appetite Eyes: No visual changes, double vision, eye pain Ear, nose and throat: No hearing loss, ear pain, nasal congestion, sore throat Cardiovascular: No chest pain, palpitations Respiratory:  No shortness of breath at rest or with exertion, wheezes GastrointestinaI: No nausea, vomiting, diarrhea, abdominal pain, fecal incontinence Genitourinary:  No dysuria, urinary retention or frequency Musculoskeletal:  No neck pain, back pain Integumentary: No rash, pruritus, skin lesions Neurological: as above Psychiatric: No depression, insomnia, anxiety Endocrine: No palpitations, fatigue, diaphoresis, mood swings, change in appetite, change in  weight, increased thirst Hematologic/Lymphatic:  No purpura, petechiae. Allergic/Immunologic: no itchy/runny eyes, nasal congestion, recent allergic reactions, rashes  PHYSICAL EXAM: Vitals:   11/02/15 1300  BP: 122/62  Pulse: 69   General: No acute distress.  Patient appears well-groomed.  . Head:  Normocephalic/atraumatic Eyes:  Fundi examined but not  visualized Neurological Exam: alert and oriented to person, place, and time. Attention span and concentration fair, delayed recall poor, remote memory intact, fund of knowledge intact.   Montreal Cognitive Assessment  11/02/2015 09/14/2014 12/26/2013  Visuospatial/ Executive (0/5) 2 4 2   Naming (0/3) 3 3 2   Attention: Read list of digits (0/2) 2 2 2   Attention: Read list of letters (0/1) 1 1 1   Attention: Serial 7 subtraction starting at 100 (0/3) 1 2 3   Language: Repeat phrase (0/2) 2 2 1   Language : Fluency (0/1) 1 0 0  Abstraction (0/2) 2 2 1   Delayed Recall (0/5) 0 0 0  Orientation (0/6) 5 6 6   Total 19 22 18   Adjusted Score (based on education) 19 22 18    Speech fluent and not dysarthric, language intact.  CN II-XII intact. Bulk and tone normal, muscle strength 5/5 throughout.  Sensation to light touch intact.  Deep tendon reflexes 2+ throughout.  Finger to nose testing revealed postural and kinetic tremor.  Some bilateral resting tremor also noted.  No rigidity.  Decreased finger tapping amplitude but otherwise no bradykinesia.  Wide based gait but no shuffling.  IMPRESSION: Alzheimer's disease Tremor.  There is a resting component to the tremor, although she does not fit criteria for Parkinson's disease.  PLAN: Will add Namenda in addition to Exelon patch Will see if we can switch metoprolol to propranolol Follow up in 6 months.  29 minutes spent face to face with patient, over 50% spent counseling.  Metta Clines, DO  CC:  Carol Ada, MD

## 2015-11-02 NOTE — Patient Instructions (Addendum)
1.  We will start memantine ER (Namenda XR).  Take starter pack as prescribed, starting at 7mg  daily to goal of 28mg  daily  Side effects include dizziness, headache, diarrhea or constipation.  Call with any questions or concerns. 2.  Continue the Exelon patch 3.  I will get back to you about switching from metoprolol to propranolol (to address the tremor) 4.  Follow up in 6 months.

## 2015-11-28 IMAGING — DX DG LUMBAR SPINE COMPLETE 4+V
5 series · 5 of 5 positions shown · non-contrast
Comparison: 03/04/2014

CLINICAL DATA: Fall in shower with low back pain, initial encounter

EXAM:
LUMBAR SPINE - COMPLETE 4+ VIEW

[l-spine ap]
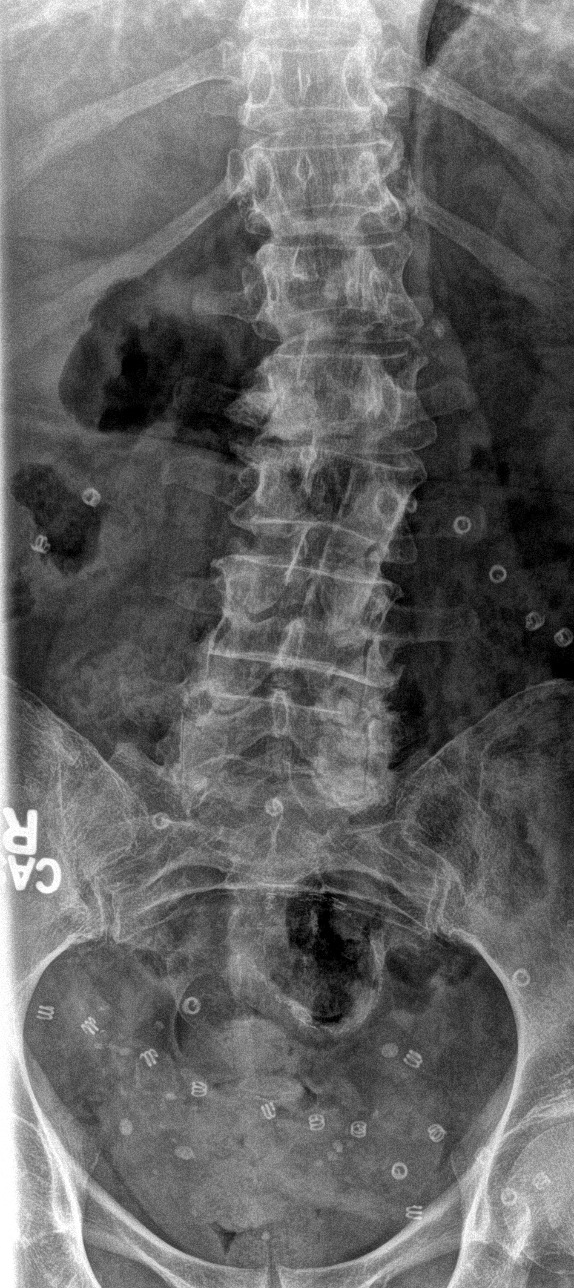

[l-spine obl (1 of 2)]
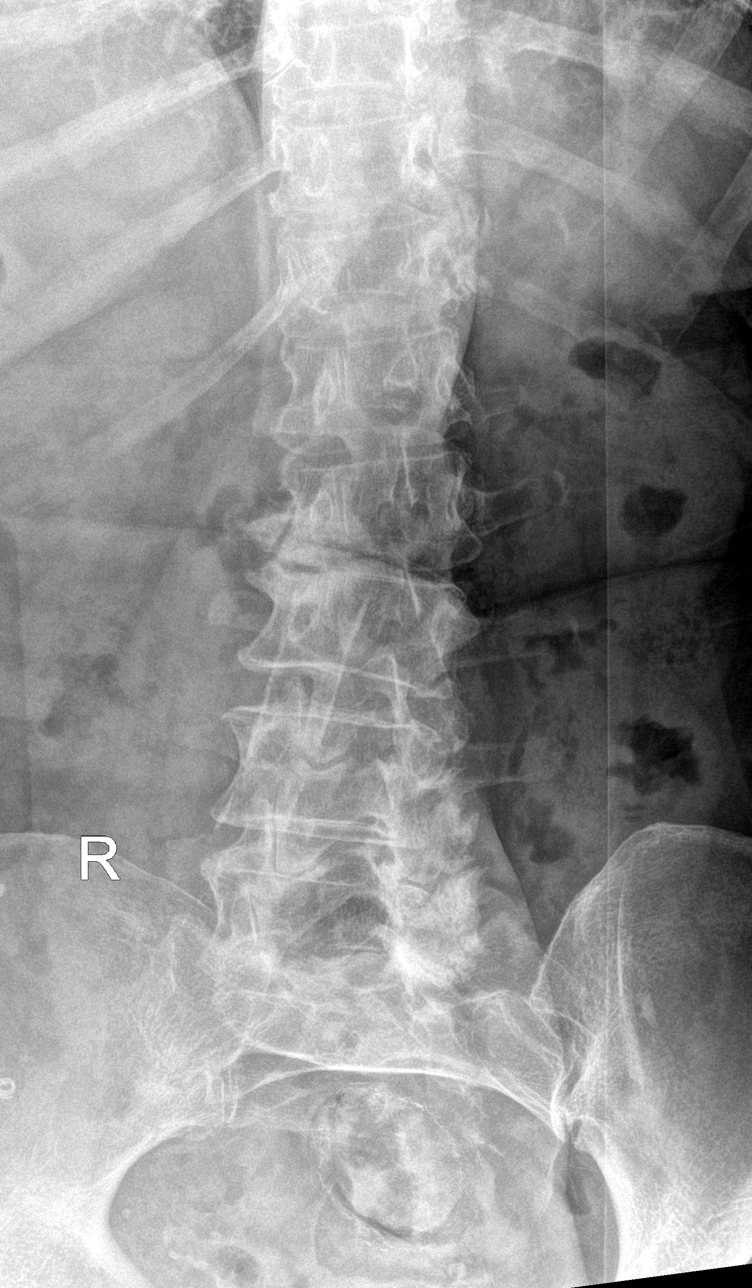

[l-spine obl (2 of 2)]
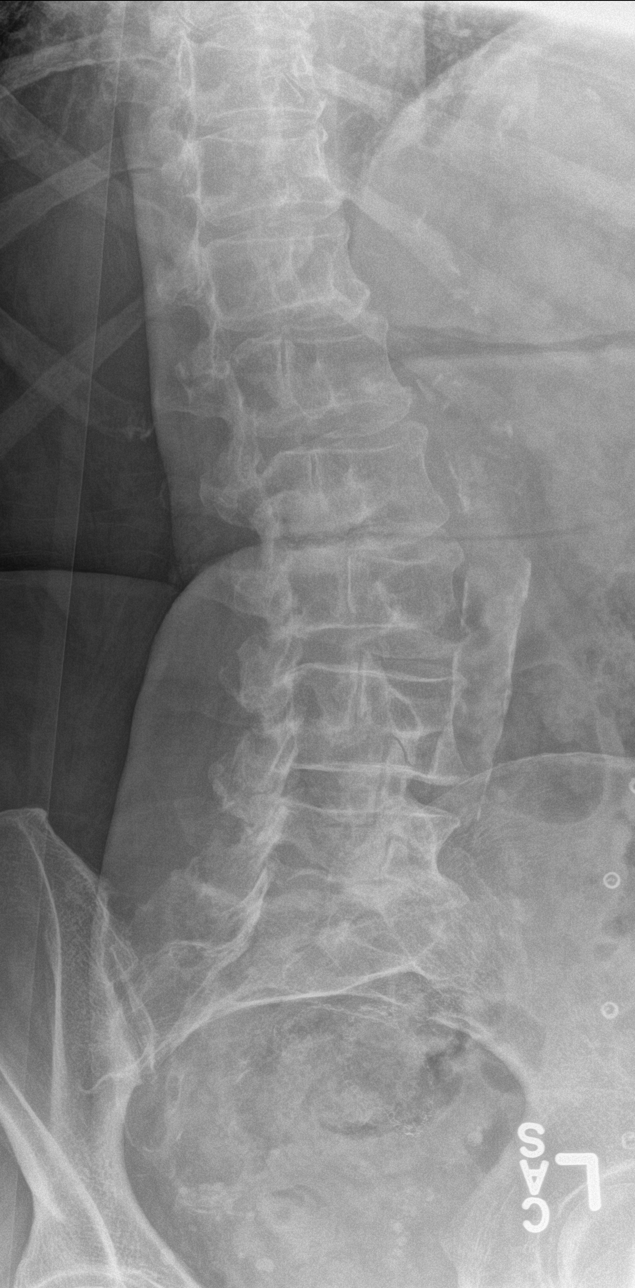

[l-spine lat]
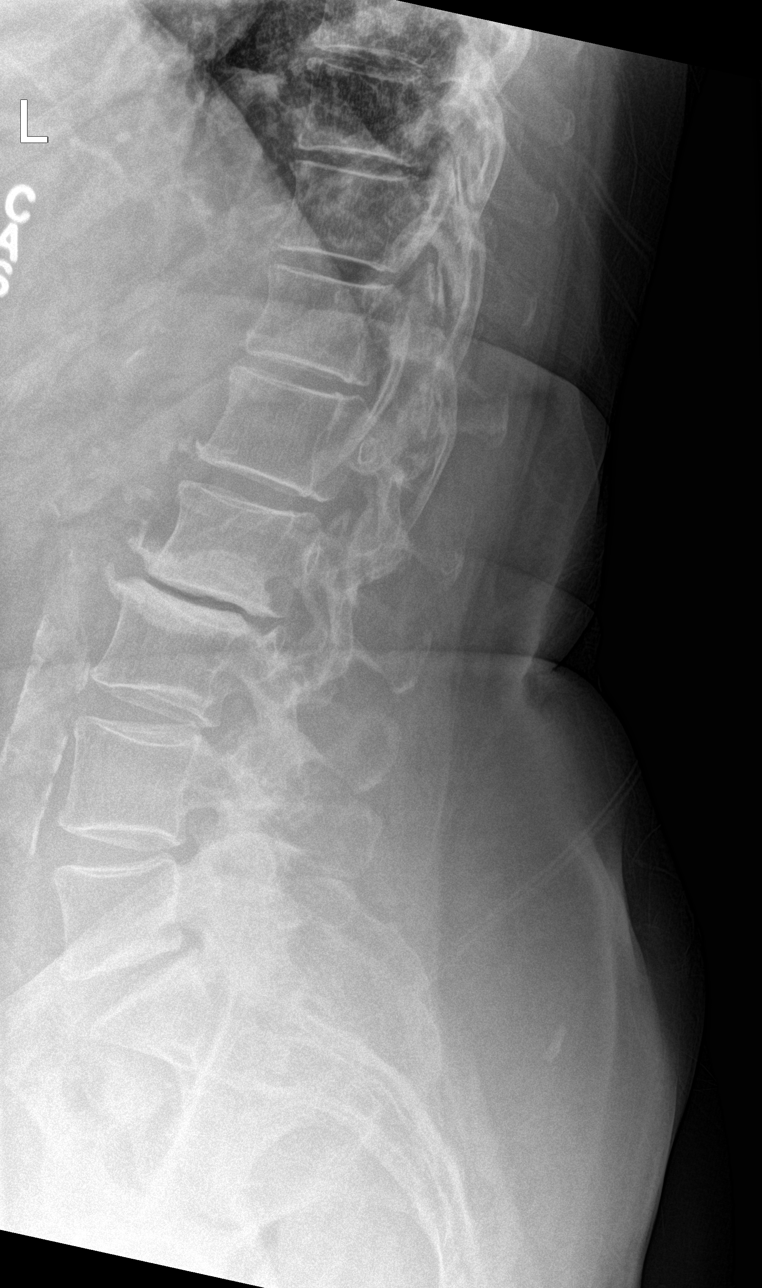

[l-spine spot]
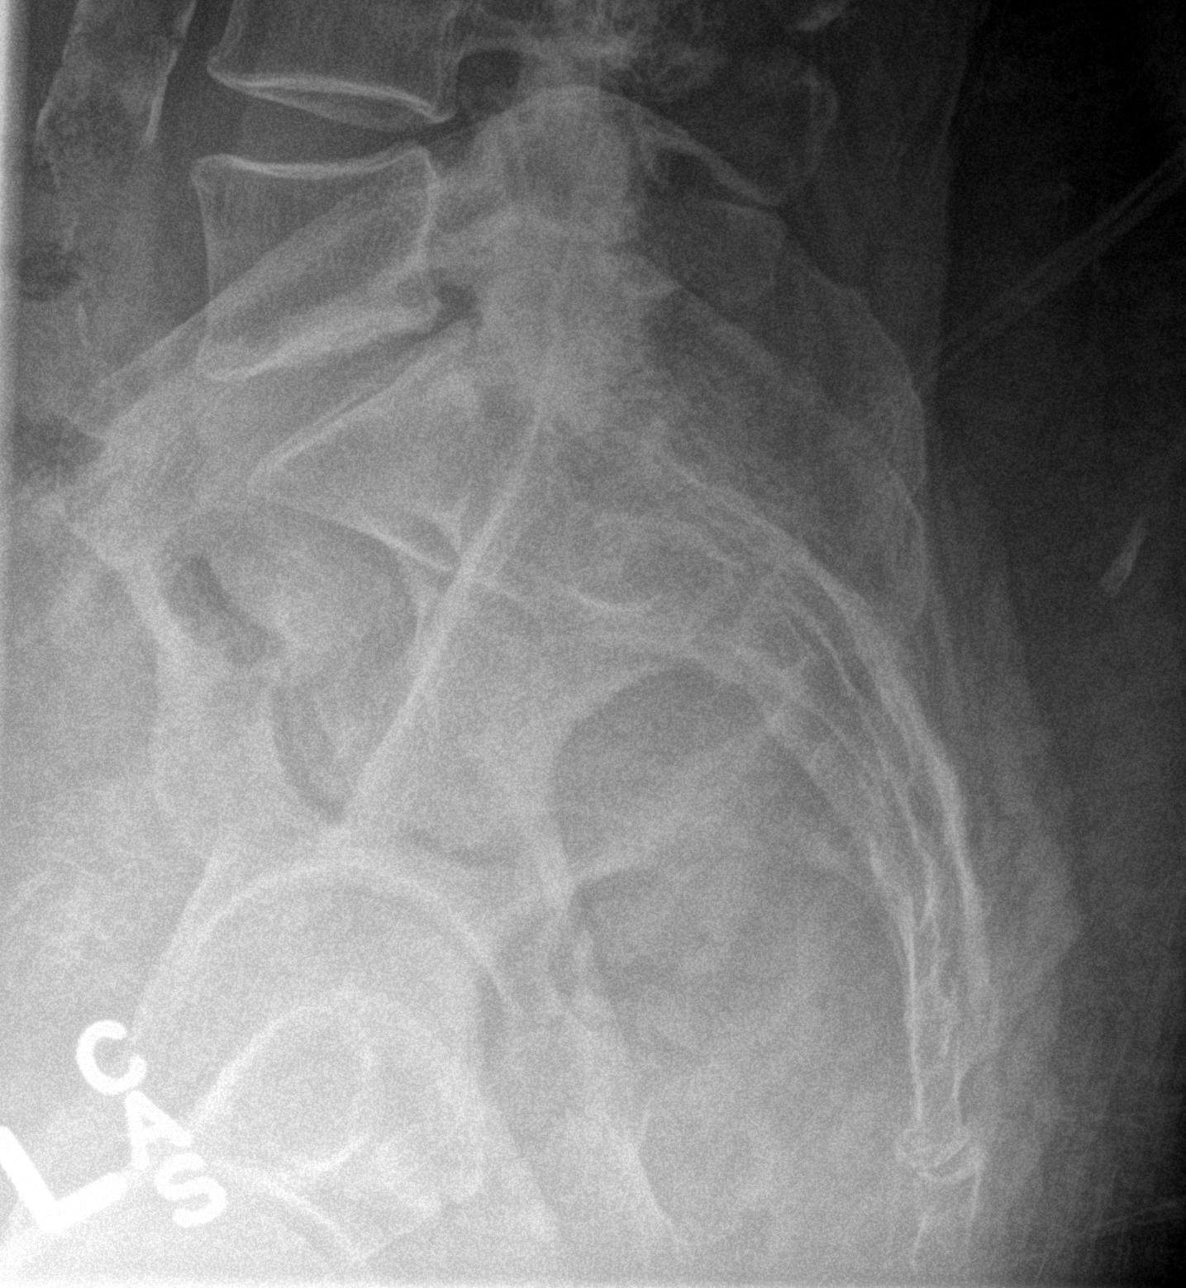

[5 of 5 positions shown; findings below may reference images not displayed]

FINDINGS: Five lumbar type vertebral bodies are well visualized. Disc space
narrowing is noted at L2-3 with sclerosis and osteophytic changes.
Mild retrolisthesis of L2 on L3 is seen. This is stable from the
prior MRI examination. No acute compression deformity is noted.
Diffuse aortic calcifications are seen. Postsurgical changes are
noted.
IMPRESSION: Chronic changes without acute abnormality.

## 2015-11-28 IMAGING — CT CT CERVICAL SPINE W/O CM
3 of 4 series · 13 of 28 positions shown, 15 images · non-contrast
Comparison: None.

CLINICAL DATA: Recent fall in shower, neck pain, initial encounter

EXAM:
CT HEAD WITHOUT CONTRAST
CT CERVICAL SPINE WITHOUT CONTRAST
TECHNIQUE: Multidetector CT imaging of the head and cervical spine was
performed following the standard protocol without intravenous
contrast. Multiplanar CT image reconstructions of the cervical spine
were also generated.

[Series 5: c_spine 2.0 i30s 3 · axial · 0.36mm/px · z∈[-206,-102]mm · 4 of 88 slices shown, 5 images]
[im 18/88  soft-tissue]
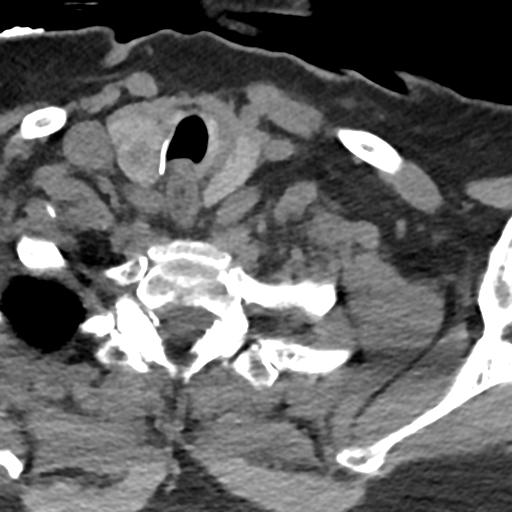
[im 18/88  bone]
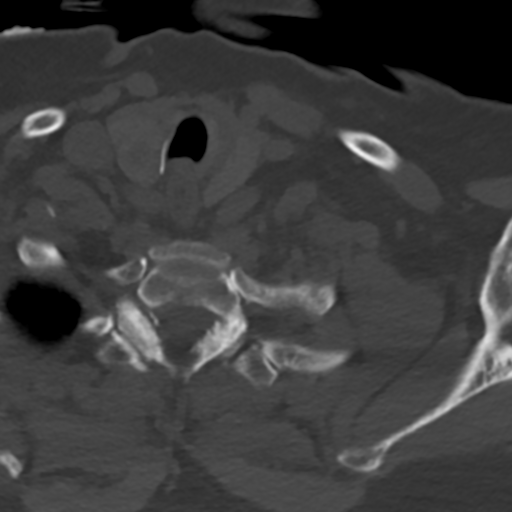
[im 35/88  bone]
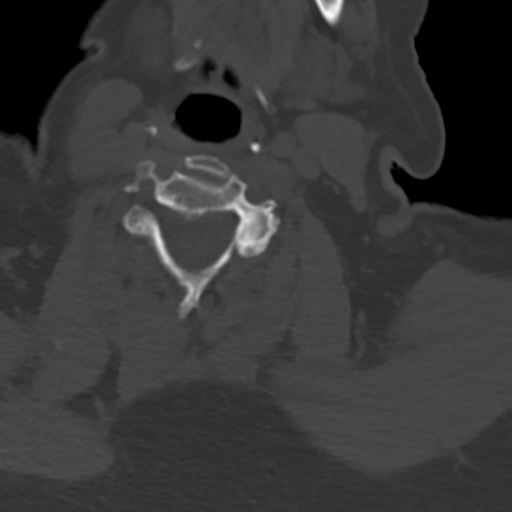
[im 53/88  bone]
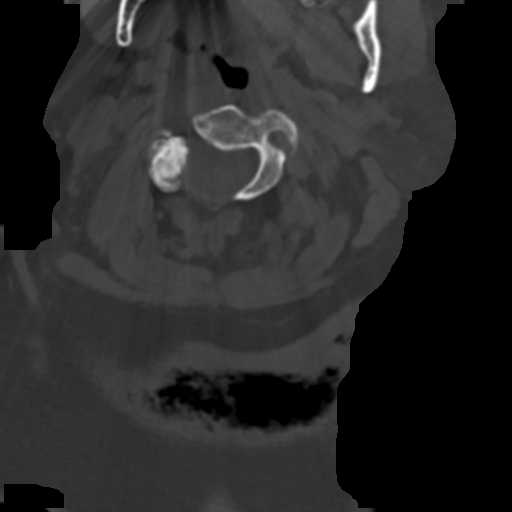
[im 70/88  bone]
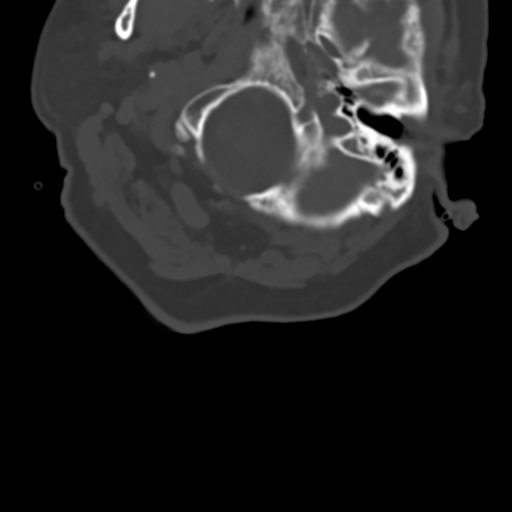

[Series 8: sagittals · sagittal · 0.32mm/px · 5 of 51 slices shown, 6 images]
[im 17/51  bone]
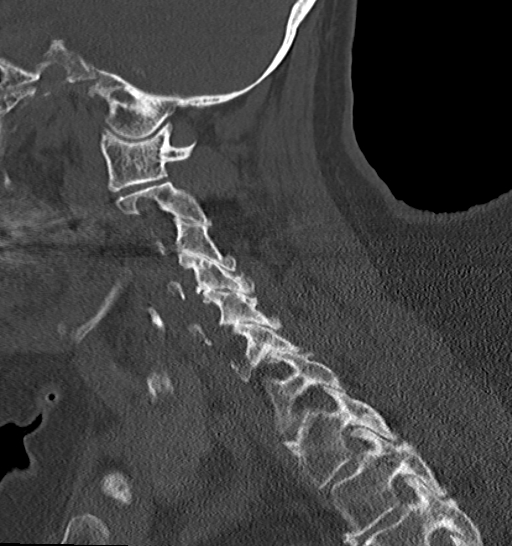
[im 21/51  bone]
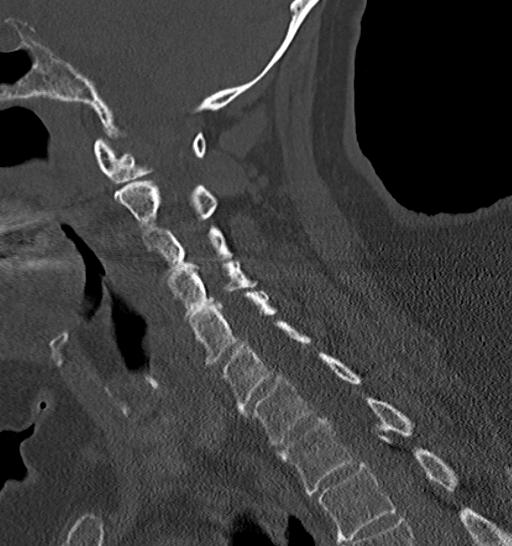
[im 26/51  soft-tissue]
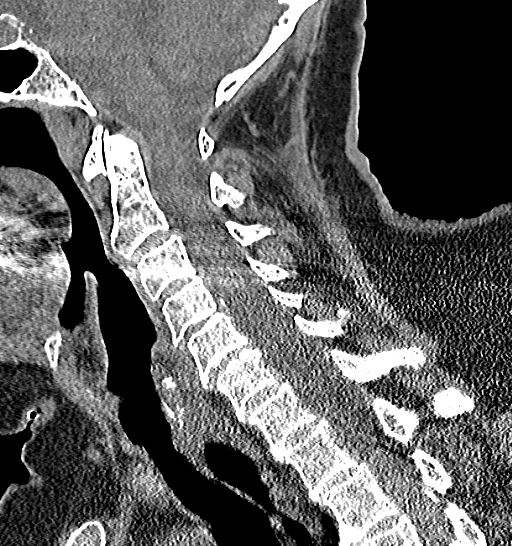
[im 26/51  bone]
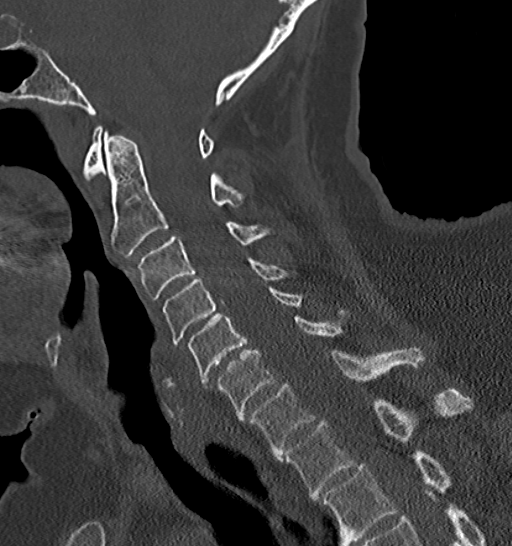
[im 30/51  bone]
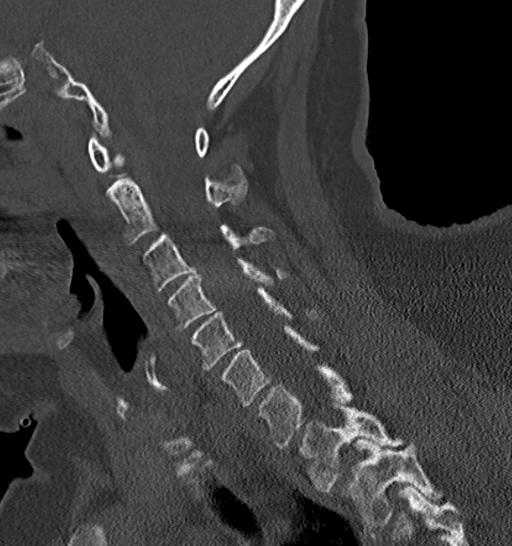
[im 34/51  bone]
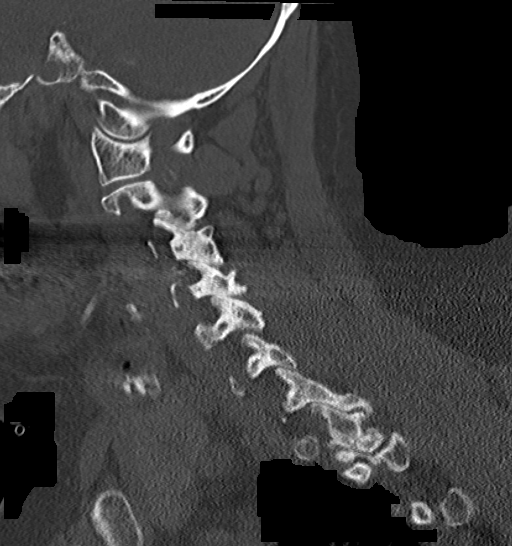

[Series 9: orthogonals · axial · 0.23mm/px · z∈[-223,-130]mm · 4 of 96 slices shown]
[im 20/96  bone]
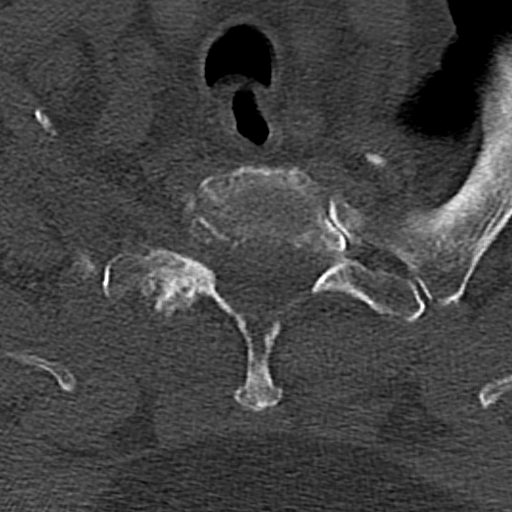
[im 39/96  bone]
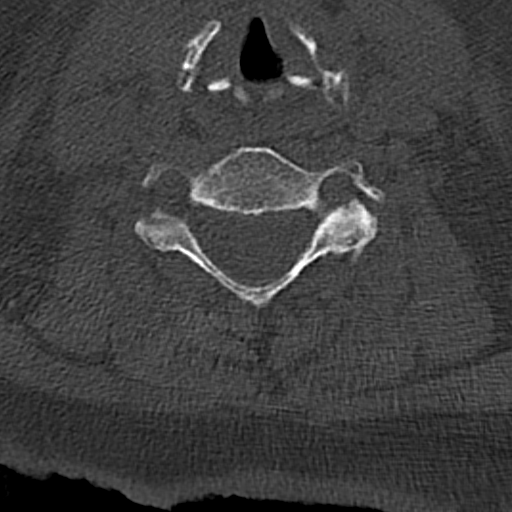
[im 58/96  bone]
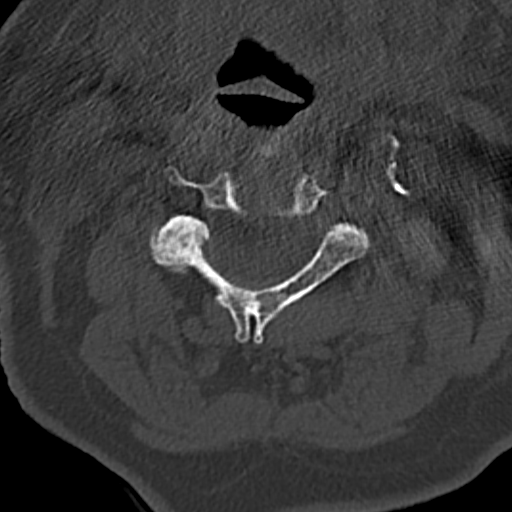
[im 77/96  bone]
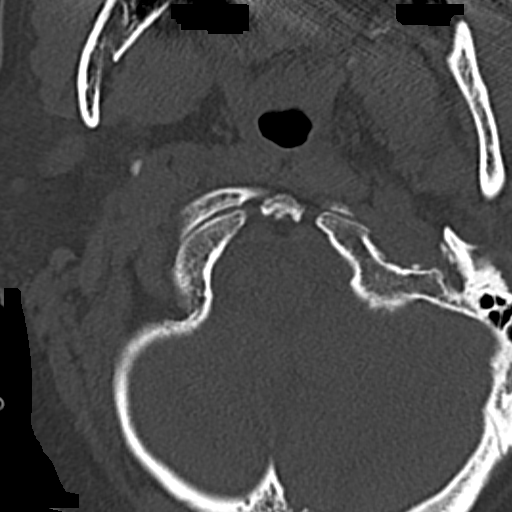

[13 of 28 positions shown; findings below may reference images not displayed]

FINDINGS: CT HEAD FINDINGS

The bony calvarium is intact. Atrophic changes are seen. Mild
chronic white matter ischemic change is noted. No findings to
suggest acute hemorrhage, acute infarction or space-occupying mass
lesion are seen.

CT CERVICAL SPINE FINDINGS

Seven cervical segments are well visualized. Vertebral body height
is well maintained. Multilevel facet hypertrophic changes are seen.
No acute fracture or acute facet abnormality is noted. The
surrounding soft tissues to include the lung apices are within
normal limits.
IMPRESSION: CT of the head: Chronic atrophic and ischemic changes without acute
abnormality.

CT of the cervical spine: Multilevel degenerative change without
acute abnormality.

## 2015-11-29 ENCOUNTER — Other Ambulatory Visit: Payer: Self-pay | Admitting: Neurology

## 2015-12-26 DIAGNOSIS — G4733 Obstructive sleep apnea (adult) (pediatric): Secondary | ICD-10-CM | POA: Diagnosis not present

## 2016-01-04 DIAGNOSIS — Z5181 Encounter for therapeutic drug level monitoring: Secondary | ICD-10-CM | POA: Diagnosis not present

## 2016-01-04 DIAGNOSIS — E1142 Type 2 diabetes mellitus with diabetic polyneuropathy: Secondary | ICD-10-CM | POA: Diagnosis not present

## 2016-01-04 DIAGNOSIS — Z794 Long term (current) use of insulin: Secondary | ICD-10-CM | POA: Diagnosis not present

## 2016-01-04 DIAGNOSIS — N183 Chronic kidney disease, stage 3 (moderate): Secondary | ICD-10-CM | POA: Diagnosis not present

## 2016-01-04 DIAGNOSIS — Z23 Encounter for immunization: Secondary | ICD-10-CM | POA: Diagnosis not present

## 2016-01-22 DIAGNOSIS — G4733 Obstructive sleep apnea (adult) (pediatric): Secondary | ICD-10-CM | POA: Diagnosis not present

## 2016-03-15 DIAGNOSIS — M1712 Unilateral primary osteoarthritis, left knee: Secondary | ICD-10-CM | POA: Diagnosis not present

## 2016-03-15 DIAGNOSIS — M1711 Unilateral primary osteoarthritis, right knee: Secondary | ICD-10-CM | POA: Diagnosis not present

## 2016-03-15 DIAGNOSIS — M19072 Primary osteoarthritis, left ankle and foot: Secondary | ICD-10-CM | POA: Diagnosis not present

## 2016-03-15 DIAGNOSIS — S8011XA Contusion of right lower leg, initial encounter: Secondary | ICD-10-CM | POA: Diagnosis not present

## 2016-03-25 DIAGNOSIS — G4733 Obstructive sleep apnea (adult) (pediatric): Secondary | ICD-10-CM | POA: Diagnosis not present

## 2016-04-09 DIAGNOSIS — J209 Acute bronchitis, unspecified: Secondary | ICD-10-CM | POA: Diagnosis not present

## 2016-04-11 DIAGNOSIS — N183 Chronic kidney disease, stage 3 (moderate): Secondary | ICD-10-CM | POA: Diagnosis not present

## 2016-04-11 DIAGNOSIS — Z5181 Encounter for therapeutic drug level monitoring: Secondary | ICD-10-CM | POA: Diagnosis not present

## 2016-04-11 DIAGNOSIS — E1142 Type 2 diabetes mellitus with diabetic polyneuropathy: Secondary | ICD-10-CM | POA: Diagnosis not present

## 2016-04-11 DIAGNOSIS — Z794 Long term (current) use of insulin: Secondary | ICD-10-CM | POA: Diagnosis not present

## 2016-04-18 DIAGNOSIS — E1142 Type 2 diabetes mellitus with diabetic polyneuropathy: Secondary | ICD-10-CM | POA: Diagnosis not present

## 2016-04-18 DIAGNOSIS — Z794 Long term (current) use of insulin: Secondary | ICD-10-CM | POA: Diagnosis not present

## 2016-04-18 DIAGNOSIS — Z7984 Long term (current) use of oral hypoglycemic drugs: Secondary | ICD-10-CM | POA: Diagnosis not present

## 2016-04-21 DIAGNOSIS — G4733 Obstructive sleep apnea (adult) (pediatric): Secondary | ICD-10-CM | POA: Diagnosis not present

## 2016-04-24 DIAGNOSIS — I480 Paroxysmal atrial fibrillation: Secondary | ICD-10-CM | POA: Diagnosis not present

## 2016-04-24 DIAGNOSIS — Z1389 Encounter for screening for other disorder: Secondary | ICD-10-CM | POA: Diagnosis not present

## 2016-04-24 DIAGNOSIS — G4700 Insomnia, unspecified: Secondary | ICD-10-CM | POA: Diagnosis not present

## 2016-04-24 DIAGNOSIS — F329 Major depressive disorder, single episode, unspecified: Secondary | ICD-10-CM | POA: Diagnosis not present

## 2016-04-24 DIAGNOSIS — Z7901 Long term (current) use of anticoagulants: Secondary | ICD-10-CM | POA: Diagnosis not present

## 2016-04-24 DIAGNOSIS — E782 Mixed hyperlipidemia: Secondary | ICD-10-CM | POA: Diagnosis not present

## 2016-04-24 DIAGNOSIS — M109 Gout, unspecified: Secondary | ICD-10-CM | POA: Diagnosis not present

## 2016-04-24 DIAGNOSIS — E1142 Type 2 diabetes mellitus with diabetic polyneuropathy: Secondary | ICD-10-CM | POA: Diagnosis not present

## 2016-04-24 DIAGNOSIS — N183 Chronic kidney disease, stage 3 (moderate): Secondary | ICD-10-CM | POA: Diagnosis not present

## 2016-04-24 DIAGNOSIS — Z0001 Encounter for general adult medical examination with abnormal findings: Secondary | ICD-10-CM | POA: Diagnosis not present

## 2016-05-09 ENCOUNTER — Encounter: Payer: Self-pay | Admitting: Neurology

## 2016-05-09 ENCOUNTER — Ambulatory Visit (INDEPENDENT_AMBULATORY_CARE_PROVIDER_SITE_OTHER): Payer: Medicare Other | Admitting: Neurology

## 2016-05-09 VITALS — BP 128/76 | HR 72 | Ht 60.0 in | Wt 218.4 lb

## 2016-05-09 DIAGNOSIS — G250 Essential tremor: Secondary | ICD-10-CM | POA: Diagnosis not present

## 2016-05-09 DIAGNOSIS — F0280 Dementia in other diseases classified elsewhere without behavioral disturbance: Secondary | ICD-10-CM

## 2016-05-09 DIAGNOSIS — G301 Alzheimer's disease with late onset: Secondary | ICD-10-CM | POA: Diagnosis not present

## 2016-05-09 NOTE — Patient Instructions (Addendum)
1.  Continue Exelon patch and Namenda 2.  I would definitely recommend moving into a facility.  I think you need daily supervision in regards to monitoring and administering medications, as well as assistance with other ADLs such as bathing, dressing and using the toilet (due to risk for falls)  [facility]: [phone]  Tel [place]:  [phone]  www.[facility]-[place].org/resources.cfm   Resources for common questions found under "Pathways & Protocols "  www.[facility].org/pathways/Pathways_Menu.htm   Resources for [facility] and [facility]isted [facility] facilities:  www.[facility]ing[facility]guide.com   For assistance with senior care, elder law, and estate planning (POA, medical directives):  [employer]  [address], [place] [address]  Tel: [phone]  [email or web address]   Berneice Heinrich  Tel: [phone]  [email or web address]   Follow up in 7 months

## 2016-05-09 NOTE — Progress Notes (Signed)
NEUROLOGY FOLLOW UP OFFICE NOTE  Madelin Weseman [id number]  HISTORY OF PRESENT ILLNESS: Kara Ortega is an 83 year old right-handed woman with history of a-fib (on [facility]), CHF, unstable angina, hypertension, fibromyalgia, sleep apnea, basal cell carcinoma, benign essential tremor, hypothyroidism, hyperlipidemia, and type II diabetes with neuropathy who follows up for essential tremor, Alzheimer's disease.  She is accompanied by her son, who provides some history.  UPDATE: Alzheimer's disease:  She takes Exelon 13.3mg  patch and Namenda.   She is tolerating it.  Her memory has been getting worse.  She frequently forgets to take her medications.  She is uncomfortable about falling when she gets dress or uses the shower, even with a seat because the water makes it slippery.  She has had increased trouble with bowel and bladder incontinence.  She had an incident a couple of weeks ago where she was locked out of her apartment and was trying to get into her neighbor's apartment instead.  She was delirious and has no recollection of the incident.  She followed up with her doctor and apparently had forgotten to take her diabetes medications and had not eaten or drank anything.  Tremor:  Still an issue.  She cannot use a fork or spoon.  After discussion with Dr. Marlou Porch (her cardiologist), we switched from metoprolol XL 25mg  twice daily to propranolol LA 60mg  daily, however it bottomed out her blood pressure and she is back on metoprolol.  It was recommended not to start primidone due to possible interactions with reducing efficacy of her Eliquis.  HISTORY: She began having memory problems several years ago, while living in [place], [place], however it has more noticeably progressed over the past 1 to 2 years.  She moved to [place] last year.  It started with short-term memory problems.  She will forget content of conversation.  She would repeat herself frequently.  She frequently repeats questions to  reconfirm things.  She requires help with administering her medications.  Her children set up her medications for the week in a pillbox.  She usually takes it correctly, but infrequently, she will miss a dose, usually because she is confused about the day of the week, even though it is written on the box.  She has problems with math and gets confused calculating the amount of insulin she uses.  She has left the stove on in the past.  She usually has meals prepared by her children or she gets [place] take-out.  She will use the stove occasionally to cook soup.  She now finds that she will forget names of acquaintances, as well as some family such as her sister.  She seldom drives, usually only to the [employer] which is behind her house.  [place] [facility] [place]2 a mile away.  She eats breakfast, but will typically graze the rest of the day.  She is able to perform her ADLs and keeps the house clean and does the laundry.  She does have difficulty with some simple tasks, such as operating the washer and dryer.  She no longer handles her finances or pays the bills (her son does it).  She lives by herself.  In the past, she used to see people in the house who weren't there, but this hasn't happened for at least a year.  She does not exhibit delusions.  She denies depression.  She does have difficulty sleeping at times.  She goes to  bed around midnight and wakes up about 1.5 hours later.  She will keep herself occupied by cooking soup until about 4 am.  She will watch TV and drift in and out of sleep until getting up for the day around 11am.  She often spends most of the day sitting and watching TV.  She takes Exelon 9.5mg  patch.   Her sister has Alzheimer's.  Her maternal grandmother and aunt had dementia.  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  She has a master's degree in  administration and supervision.   She also has recurrent falls.  He has had gait instability for many years due to imbalance.  Over the past year, she has had some falls.  Often she says her left knee gives out.  She also reports pain in the left knee.  She has some known mild weakness in the left leg and was told by her former neurologist that she may have had a small stroke   She has essential tremor:  After discussing with her cardiologist, it appears Eliquis is only contraindicated with strong CYP3A4 and P-gp inhibitors and inducers.  Although primidone is only a CYP3A4 inducer, there may still be concern that it would lower efficacy of Eliquis.  She is on low dose metoprolol 25mg , so low dose propranolol seemed okay to try.  However, when she was started on propranolol 60mg , she became hypotensive and bradycardic, causing falls, thus propranolol was discontinued.  PAST MEDICAL HISTORY: Past Medical History:  Diagnosis Date  . A-fib ([place])   . Abnormal heart rhythms   . Alzheimer disease   . Anginal pain ([place])   . Back pain   . CHF (congestive heart failure) ([place])   . Chronic kidney disease    STAGE 3   . COPD (chronic obstructive pulmonary disease) ([place])   . Depression   . Diabetes ([place])    insulin dependent  . Hypertension   . Leg pain   . Memory loss   . Neuropathy ([place])   . Shortness of breath   . Sleep apnea    uses bipap X12 years.  . Stroke [facility])    LEFT SIDE RESIDUAL    MEDICATIONS: Current Outpatient Prescriptions on File Prior to Visit  Medication Sig Dispense Refill  . ALPRAZolam (XANAX) 0.25 MG tablet Take 0.25 mg by mouth at bedtime.     Marland Kitchen apixaban (ELIQUIS) 5 MG TABS tablet Take 1 tablet (5 mg total) by mouth 2 (two) times daily. 180 tablet 3  . atorvastatin (LIPITOR) 10 MG tablet Take 10 mg by mouth at bedtime.     Marland Kitchen b complex vitamins tablet Take 1 tablet by mouth daily.    . B-D UF III MINI PEN NEEDLES 31G X 5 MM MISC U UTD TO INJECT INSULIN  5  .  Cholecalciferol (HM VITAMIN D3) 4000 UNITS CAPS Take 4,000 Units by mouth daily.     . citalopram (CELEXA) 20 MG tablet Take 20 mg by mouth daily.    . colchicine 0.6 MG tablet Take 0.6 mg by mouth daily.     . diclofenac sodium (VOLTAREN) 1 % GEL Apply 4 g topically 4 (four) times daily as needed (For pain.).     Marland Kitchen DILT-XR 240 MG 24 hr capsule TK 1 C PO QD  1  . diltiazem (TIAZAC) 240 MG 24 hr capsule Take 240 mg by mouth daily.    Marland Kitchen  febuxostat (ULORIC) 40 MG tablet Take 40 mg by mouth daily.    . fenofibrate micronized (LOFIBRA) 134 MG capsule Take 134 mg by mouth daily.    . furosemide (LASIX) 40 MG tablet Take 40 mg by mouth daily.     Marland Kitchen HYDROcodone-acetaminophen (NORCO) 5-325 MG tablet Take 1 tablet by mouth every 6 (six) hours as needed for severe pain. 20 tablet 0  . isosorbide mononitrate (IMDUR) 30 MG 24 hr tablet Take 60 mg by mouth daily.     . Liraglutide (VICTOZA) 18 MG/3ML SOPN Inject 1.2 mg into the skin daily.     Marland Kitchen losartan (COZAAR) 25 MG tablet Take 25 mg by mouth daily.    Marland Kitchen LYRICA 100 MG capsule Take 100-200 mg by mouth 2 (two) times daily. She takes one capsule in the morning and two capsules at bedtime.  0  . memantine (NAMENDA TITRATION PAK) tablet pack 5 mg/day for =1 week; 5 mg twice daily for =1 week; 15 mg/day given in 5 mg and 10 mg separated doses for =1 week; then 10 mg twice daily 49 tablet 12  . metoprolol succinate (TOPROL-XL) 25 MG 24 hr tablet Take 25 mg by mouth 2 (two) times daily.     . Multiple Vitamins-Minerals (MULTIVITAMIN WITH MINERALS) tablet Take 1 tablet by mouth daily.    . nitroGLYCERIN (NITROSTAT) 0.4 MG SL tablet Place 1 tablet (0.4 mg total) under the tongue every 5 (five) minutes as needed for chest pain. 25 tablet prn  . pantoprazole (PROTONIX) 40 MG tablet Take 40 mg by mouth daily. Reported on 04/20/2015    . potassium chloride (KLOR-CON) 20 MEQ packet Take 40 mEq by mouth 2 (two) times daily.     . Rivastigmine 13.3 MG/24HR PT24 Place 1 patch  (13.3 mg total) onto the skin daily. 30 patch 0  . Rivastigmine 13.3 MG/24HR PT24 PLACE 1 PATCH ON SKIN AND CHANGE EVERY 24 HOURS 30 patch 0  . Rivastigmine 13.3 MG/24HR PT24 PLACE 1 PATCH ON SKIN AND CHANGE EVERY 24 HOURS 30 patch 5  . spironolactone (ALDACTONE) 25 MG tablet Take 25 mg by mouth 2 (two) times daily.     Nelva Nay SOLOSTAR 300 UNIT/ML SOPN Inject 80 mg into the skin at bedtime.   5  . zolpidem (AMBIEN) 5 MG tablet Take 5 mg by mouth at bedtime as needed for sleep.    . propranolol ER (INDERAL LA) 60 MG 24 hr capsule TAKE 1 CAPSULE(60 MG) BY MOUTH DAILY (Patient not taking: Reported on 05/09/2016) 90 capsule 1   No current facility-administered medications on file prior to visit.     ALLERGIES: Allergies  Allergen Reactions  . Avandia [Rosiglitazone] Other (See Comments)    edema  . Benazepril Other (See Comments)    unknown  . Dilaudid [Hydromorphone Hcl] Other (See Comments)    "does not tolerate strong pain medications" per pt  . Fosamax [Alendronate Sodium] Nausea And Vomiting  . Talwin [Pentazocine] Nausea And Vomiting  . Tetanus Toxoids Other (See Comments)    "allergic to something in the tetanus injection" per patient  . Zyrtec [Cetirizine] Other (See Comments)    unknown    FAMILY HISTORY: Family History  Problem Relation Age of Onset  . Dementia Maternal Grandmother   . Heart disease Mother   . Heart disease Sister   . Alzheimer's disease Sister   . Dementia Maternal Aunt   . Mental illness Other     SOCIAL HISTORY: Social History  Social History  . Marital status: Single    Spouse name: N/A  . Number of children: 3  . Years of education: N/A   Occupational History  . retired    Social History Main Topics  . Smoking status: Former Smoker    Packs/day: 1.50    Years: 18.00    Types: Cigarettes    Quit date: 03/31/1968  . Smokeless tobacco: Never Used  . Alcohol use No  . Drug use: No  . Sexual activity: No   Other Topics Concern  .  Not on file   Social History Narrative  . No narrative on file    REVIEW OF SYSTEMS: Constitutional: No fevers, chills, or sweats, no generalized fatigue, change in appetite Eyes: No visual changes, double vision, eye pain Ear, nose and throat: No hearing loss, ear pain, nasal congestion, sore throat Cardiovascular: No chest pain, palpitations Respiratory:  No shortness of breath at rest or with exertion, wheezes GastrointestinaI: No nausea, vomiting, diarrhea, abdominal pain, fecal incontinence Genitourinary:  No dysuria, urinary retention or frequency Musculoskeletal:  No neck pain, back pain Integumentary: No rash, pruritus, skin lesions Neurological: as above Psychiatric: No depression, insomnia, anxiety Endocrine: No palpitations, fatigue, diaphoresis, mood swings, change in appetite, change in weight, increased thirst Hematologic/Lymphatic:  No purpura, petechiae. Allergic/Immunologic: no itchy/runny eyes, nasal congestion, recent allergic reactions, rashes  PHYSICAL EXAM: Vitals:   05/09/16 1416  BP: 128/76  Pulse: 72   General: No acute distress.  Patient appears well-groomed.  Head:  Normocephalic/atraumatic Eyes:  Fundi examined but not visualized Neck: supple, no paraspinal tenderness, full range of motion Heart:  Regular rate and rhythm Lungs:  Clear to auscultation bilaterally Back: No paraspinal tenderness Neurological Exam: alert and oriented to person, place, and time. Attention span and concentration fair, delayed recall poor, remote memory intact, fund of knowledge intact.   Montreal Cognitive Assessment  05/09/2016 11/02/2015 09/14/2014 12/26/2013  Visuospatial/ Executive (0/5) 2 2 4 2   Naming (0/3) 3 3 3 2   Attention: Read list of digits (0/2) 2 2 2 2   Attention: Read list of letters (0/1) 1 1 1 1   Attention: Serial 7 subtraction starting at 100 (0/3) 1 1 2 3   Language: Repeat phrase (0/2) 2 2 2 1   Language : Fluency (0/1) 0 1 0 0  Abstraction (0/2) 2 2 2 1     Delayed Recall (0/5) 2 0 0 0  Orientation (0/6) 5 5 6 6   Total 20 19 22 18   Adjusted Score (based on education) 20 19 22 18    Speech fluent and not dysarthric, language intact.  CN II-XII intact. Bulk and tone normal, muscle strength 5/5 throughout.  Sensation to light touch intact.  Deep tendon reflexes 2+ throughout.  Finger to nose testing revealed postural and kinetic tremor.  Some bilateral resting tremor also noted.  No rigidity.  Decreased finger tapping amplitude but otherwise no bradykinesia.  Wide based gait but no shuffling.  IMPRESSION: Alzheimer's dementia  -  I feel it is imperative that Ms. Obara receive 24 hour supervision.  She needs monitoring and somebody to administer her medications.  She is a high fall risk and needs assistance performing ADLs such as bathing, dressing and transferring to the toilet.  Also, at a facility, she will can socialize and participate in activities that would benefit her cognitive health.  Resources for facilities provided.  - Continue Exelon patch and Namenda Essential tremor  -  Will continue to monitor for now.  Other possible  medications include gabapentin or topiramate, but side effects likely will outweigh benefits.  Follow up in 7 months.  28 minutes spent face to face with patient, over 50% spent discussing prognosis, need for supervision and medication management.  Metta Clines, DO  CC:  Carol Ada, MD

## 2016-05-09 NOTE — Progress Notes (Signed)
Note routed

## 2016-05-19 DIAGNOSIS — N183 Chronic kidney disease, stage 3 (moderate): Secondary | ICD-10-CM | POA: Diagnosis not present

## 2016-05-24 DIAGNOSIS — G4733 Obstructive sleep apnea (adult) (pediatric): Secondary | ICD-10-CM | POA: Diagnosis not present

## 2016-06-03 DIAGNOSIS — Z111 Encounter for screening for respiratory tuberculosis: Secondary | ICD-10-CM | POA: Diagnosis not present

## 2016-06-05 DIAGNOSIS — N6320 Unspecified lump in the left breast, unspecified quadrant: Secondary | ICD-10-CM | POA: Diagnosis not present

## 2016-06-11 ENCOUNTER — Encounter: Payer: Self-pay | Admitting: Podiatry

## 2016-06-11 ENCOUNTER — Ambulatory Visit (INDEPENDENT_AMBULATORY_CARE_PROVIDER_SITE_OTHER): Payer: Medicare Other | Admitting: Podiatry

## 2016-06-11 DIAGNOSIS — M2042 Other hammer toe(s) (acquired), left foot: Secondary | ICD-10-CM | POA: Diagnosis not present

## 2016-06-11 DIAGNOSIS — B351 Tinea unguium: Secondary | ICD-10-CM | POA: Diagnosis not present

## 2016-06-11 DIAGNOSIS — M2041 Other hammer toe(s) (acquired), right foot: Secondary | ICD-10-CM

## 2016-06-11 DIAGNOSIS — M79605 Pain in left leg: Secondary | ICD-10-CM | POA: Diagnosis not present

## 2016-06-11 DIAGNOSIS — M79676 Pain in unspecified toe(s): Secondary | ICD-10-CM | POA: Diagnosis not present

## 2016-06-11 DIAGNOSIS — E119 Type 2 diabetes mellitus without complications: Secondary | ICD-10-CM

## 2016-06-11 DIAGNOSIS — I872 Venous insufficiency (chronic) (peripheral): Secondary | ICD-10-CM | POA: Diagnosis not present

## 2016-06-11 DIAGNOSIS — E1149 Type 2 diabetes mellitus with other diabetic neurological complication: Secondary | ICD-10-CM | POA: Diagnosis not present

## 2016-06-11 NOTE — Progress Notes (Signed)
Subjective: 82 y.o. returns the office today for painful, elongated, thickened toenails which she cannot trim herself. Denies any redness or drainage around the nails. She also presents today requesting diabetic shoes as hers are worn out. She also states that she has left leg pain which has been ongoing for several months. Denies any acute changes since last appointment and no new complaints today. Denies any systemic complaints such as fevers, chills, nausea, vomiting.   Objective: AAO 3, NAD DP/PT pulses palpable, CRT less than 3 seconds Sensation decreased with SWMF Nails hypertrophic, dystrophic, elongated, brittle, discolored 10. There is tenderness overlying the nails 1-5 bilaterally. There is no surrounding erythema or drainage along the nail sites. Hammertoes are present.  There is chronic discoloration likely from venous disease to bilateral legs. There is mild tenderness to the calf on the left side there is no erythema or swelling. No open lesions or pre-ulcerative lesions are identified. No other areas of tenderness bilateral lower extremities. No overlying edema, erythema, increased warmth. No pain with calf compression, swelling, warmth, erythema.  Assessment: Patient presents with symptomatic onychomycosis; diabetic foot exam; left leg pain  Plan: -Treatment options including alternatives, risks, complications were discussed -Nails sharply debrided 10 without complication/bleeding. -Paperwork completed today for pre-certification of diabetic shoes.  -we'll order venous reflux study to evaluate circulation bilaterally. She has venous skin changes. Blood clot unlikely given duration. No area pinpoint ten -Discussed daily foot inspection. If there are any changes, to call the office immediately.  -Follow-up in 3 months or sooner if any problems are to arise. In the meantime, encouraged to call the office with any questions, concerns, changes symptoms.  Celesta Gentile,  DPM

## 2016-06-12 ENCOUNTER — Telehealth: Payer: Self-pay | Admitting: *Deleted

## 2016-06-12 DIAGNOSIS — I872 Venous insufficiency (chronic) (peripheral): Secondary | ICD-10-CM

## 2016-06-12 NOTE — Telephone Encounter (Addendum)
-----   Message from Trula Slade, DPM sent at 06/11/2016  6:10 PM EDT ----- Please schedule a venous reflux study for her. Thanks. 06/12/2016-Faxed orders to [facility].

## 2016-07-01 DIAGNOSIS — K649 Unspecified hemorrhoids: Secondary | ICD-10-CM | POA: Diagnosis not present

## 2016-07-01 DIAGNOSIS — I2510 Atherosclerotic heart disease of native coronary artery without angina pectoris: Secondary | ICD-10-CM | POA: Diagnosis not present

## 2016-07-01 DIAGNOSIS — M109 Gout, unspecified: Secondary | ICD-10-CM | POA: Diagnosis not present

## 2016-07-01 DIAGNOSIS — R319 Hematuria, unspecified: Secondary | ICD-10-CM | POA: Diagnosis not present

## 2016-07-11 DIAGNOSIS — K649 Unspecified hemorrhoids: Secondary | ICD-10-CM | POA: Diagnosis not present

## 2016-07-11 DIAGNOSIS — H524 Presbyopia: Secondary | ICD-10-CM | POA: Diagnosis not present

## 2016-07-17 DIAGNOSIS — M545 Low back pain: Secondary | ICD-10-CM | POA: Diagnosis not present

## 2016-07-17 DIAGNOSIS — M79672 Pain in left foot: Secondary | ICD-10-CM | POA: Diagnosis not present

## 2016-07-17 DIAGNOSIS — M1712 Unilateral primary osteoarthritis, left knee: Secondary | ICD-10-CM | POA: Diagnosis not present

## 2016-07-18 DIAGNOSIS — N183 Chronic kidney disease, stage 3 (moderate): Secondary | ICD-10-CM | POA: Diagnosis not present

## 2016-07-18 DIAGNOSIS — Z794 Long term (current) use of insulin: Secondary | ICD-10-CM | POA: Diagnosis not present

## 2016-07-18 DIAGNOSIS — Z5181 Encounter for therapeutic drug level monitoring: Secondary | ICD-10-CM | POA: Diagnosis not present

## 2016-07-18 DIAGNOSIS — E1142 Type 2 diabetes mellitus with diabetic polyneuropathy: Secondary | ICD-10-CM | POA: Diagnosis not present

## 2016-07-21 DIAGNOSIS — G4733 Obstructive sleep apnea (adult) (pediatric): Secondary | ICD-10-CM | POA: Diagnosis not present

## 2016-07-25 ENCOUNTER — Encounter (HOSPITAL_COMMUNITY): Payer: Self-pay

## 2016-07-25 ENCOUNTER — Emergency Department (HOSPITAL_COMMUNITY): Payer: Medicare Other

## 2016-07-25 ENCOUNTER — Emergency Department (HOSPITAL_COMMUNITY)
Admission: EM | Admit: 2016-07-25 | Discharge: 2016-07-25 | Disposition: A | Discharge status: Home / Self Care | Payer: Medicare Other | Attending: Emergency Medicine | Admitting: Emergency Medicine

## 2016-07-25 DIAGNOSIS — E1122 Type 2 diabetes mellitus with diabetic chronic kidney disease: Secondary | ICD-10-CM | POA: Insufficient documentation

## 2016-07-25 DIAGNOSIS — N183 Chronic kidney disease, stage 3 (moderate): Secondary | ICD-10-CM | POA: Diagnosis not present

## 2016-07-25 DIAGNOSIS — J449 Chronic obstructive pulmonary disease, unspecified: Secondary | ICD-10-CM | POA: Insufficient documentation

## 2016-07-25 DIAGNOSIS — Z7901 Long term (current) use of anticoagulants: Secondary | ICD-10-CM | POA: Diagnosis not present

## 2016-07-25 DIAGNOSIS — W06XXXA Fall from bed, initial encounter: Secondary | ICD-10-CM | POA: Diagnosis not present

## 2016-07-25 DIAGNOSIS — Z87891 Personal history of nicotine dependence: Secondary | ICD-10-CM | POA: Insufficient documentation

## 2016-07-25 DIAGNOSIS — Z79899 Other long term (current) drug therapy: Secondary | ICD-10-CM | POA: Diagnosis not present

## 2016-07-25 DIAGNOSIS — I5032 Chronic diastolic (congestive) heart failure: Secondary | ICD-10-CM | POA: Insufficient documentation

## 2016-07-25 DIAGNOSIS — Z8673 Personal history of transient ischemic attack (TIA), and cerebral infarction without residual deficits: Secondary | ICD-10-CM | POA: Diagnosis not present

## 2016-07-25 DIAGNOSIS — I130 Hypertensive heart and chronic kidney disease with heart failure and stage 1 through stage 4 chronic kidney disease, or unspecified chronic kidney disease: Secondary | ICD-10-CM | POA: Diagnosis not present

## 2016-07-25 DIAGNOSIS — S3992XA Unspecified injury of lower back, initial encounter: Secondary | ICD-10-CM | POA: Diagnosis present

## 2016-07-25 DIAGNOSIS — S0990XA Unspecified injury of head, initial encounter: Secondary | ICD-10-CM | POA: Diagnosis not present

## 2016-07-25 DIAGNOSIS — G309 Alzheimer's disease, unspecified: Secondary | ICD-10-CM | POA: Diagnosis not present

## 2016-07-25 DIAGNOSIS — R100 Acute abdomen: Secondary | ICD-10-CM | POA: Diagnosis not present

## 2016-07-25 DIAGNOSIS — Y939 Activity, unspecified: Secondary | ICD-10-CM | POA: Diagnosis not present

## 2016-07-25 DIAGNOSIS — Y929 Unspecified place or not applicable: Secondary | ICD-10-CM | POA: Insufficient documentation

## 2016-07-25 DIAGNOSIS — Y999 Unspecified external cause status: Secondary | ICD-10-CM | POA: Insufficient documentation

## 2016-07-25 DIAGNOSIS — S300XXA Contusion of lower back and pelvis, initial encounter: Secondary | ICD-10-CM | POA: Insufficient documentation

## 2016-07-25 DIAGNOSIS — S3690XA Unspecified injury of unspecified intra-abdominal organ, initial encounter: Secondary | ICD-10-CM | POA: Diagnosis not present

## 2016-07-25 DIAGNOSIS — S199XXA Unspecified injury of neck, initial encounter: Secondary | ICD-10-CM | POA: Diagnosis not present

## 2016-07-25 DIAGNOSIS — M545 Low back pain: Secondary | ICD-10-CM | POA: Diagnosis not present

## 2016-07-25 DIAGNOSIS — S99922A Unspecified injury of left foot, initial encounter: Secondary | ICD-10-CM | POA: Diagnosis not present

## 2016-07-25 MED ORDER — LORAZEPAM 0.5 MG PO TABS: 0.5000 mg | ORAL_TABLET | Freq: Once | ORAL | Status: DC

## 2016-07-25 MED ORDER — PREGABALIN 100 MG PO CAPS: 100.0000 mg | ORAL_CAPSULE | Freq: Two times a day (BID) | ORAL | 0 refills | Status: AC

## 2016-07-25 NOTE — ED Triage Notes (Signed)
Pt comes from [facility] assisted [facility] via [facility], pt rolled out of bed tonight into the floor, second time this week. Unknown if she hit her head, pt on blood thinners. Pt c/o of pain all over

## 2016-07-25 NOTE — ED Provider Notes (Signed)
[facility] DEPT Provider Note   CSN: [id number] Arrival date & time: 07/25/16  0435     History   Chief Complaint Chief Complaint  Patient presents with  . Fall    HPI Kara Ortega is a 83 y.o. female.  Patient presents to the emergency department for evaluation after a fall. Patient reports that she fell out of bed this evening. This is the second time this has happened in several weeks. Patient complaining primarily of lower back pain, but does have some neck stiffness and headache. Pain is constant, worsens with movement.      Past Medical History:  Diagnosis Date  . A-fib ([place])   . Abnormal heart rhythms   . Alzheimer disease   . Anginal pain ([place])   . Back pain   . CHF (congestive heart failure) ([place])   . Chronic kidney disease    STAGE 3   . COPD (chronic obstructive pulmonary disease) ([place])   . Depression   . Diabetes ([place])    insulin dependent  . Hypertension   . Leg pain   . Memory loss   . Neuropathy   . Shortness of breath   . Sleep apnea    uses bipap X12 years.  . Stroke [facility])    LEFT SIDE RESIDUAL    Patient Active Problem List   Diagnosis Date Noted  . Essential tremor 09/14/2014  . High cholesterol 05/19/2014  . Persistent atrial fibrillation ([place]) 05/19/2014  . Long-term use of high-risk medication 05/19/2014  . Accident due to mechanical fall without injury 03/24/2014  . Diastolic dysfunction with chronic heart failure ([place]) 03/24/2014  . Sinus bradycardia 03/24/2014  . Hypotension 03/24/2014  . OSA (obstructive sleep apnea) 03/24/2014  . Warfarin anticoagulation 03/24/2014  . Aortic stenosis 03/24/2014  . Mitral stenosis 03/24/2014  . DOE (dyspnea on exertion) 03/24/2014  . Recurrent falls 03/24/2014  . AKI (acute kidney injury) ([place]) 03/23/2014  . Alzheimer's dementia 12/26/2013  . Unstable angina ([place]) 09/14/2013  . Diarrhea 09/14/2013  . Chest pain at rest- most likely GI related 09/14/2013  . CHF (congestive heart  failure) ([place])   . A-fib ([place])   . Diabetes ([place])   . Dyspnea on exertion 03/25/2013    Past Surgical History:  Procedure Laterality Date  . BREAST LUMPECTOMY    . BREAST SURGERY     X4  . COLON SURGERY     removed 14 inches of colon  . HERNIA REPAIR      OB History    No data available       Home Medications    Prior to Admission medications   Medication Sig Start Date End Date Taking? Authorizing Provider  ALPRAZolam (XANAX) 0.25 MG tablet Take 0.25 mg by mouth at bedtime.     Historical Provider, MD  apixaban (ELIQUIS) 5 MG TABS tablet Take 1 tablet (5 mg total) by mouth 2 (two) times daily. 02/19/15   Isaiah Serge, NP  atorvastatin (LIPITOR) 10 MG tablet Take 10 mg by mouth at bedtime.     Historical Provider, MD  b complex vitamins tablet Take 1 tablet by mouth daily.    Historical Provider, MD  B-D UF III MINI PEN NEEDLES 31G X 5 MM MISC U UTD TO INJECT INSULIN 02/25/15   Historical Provider, MD  Cholecalciferol (HM VITAMIN D3) 4000 UNITS CAPS Take 4,000 Units by mouth daily.     Historical Provider, MD  citalopram (CELEXA) 20 MG tablet Take 20 mg by mouth  daily.    Historical Provider, MD  colchicine 0.6 MG tablet Take 0.6 mg by mouth daily.     Historical Provider, MD  diclofenac sodium (VOLTAREN) 1 % GEL Apply 4 g topically 4 (four) times daily as needed (For pain.).     Historical Provider, MD  DILT-XR 240 MG 24 hr capsule TK 1 C PO QD 10/17/15   Historical Provider, MD  diltiazem (TIAZAC) 240 MG 24 hr capsule Take 240 mg by mouth daily.    Historical Provider, MD  febuxostat (ULORIC) 40 MG tablet Take 40 mg by mouth daily.    Historical Provider, MD  fenofibrate micronized (LOFIBRA) 134 MG capsule Take 134 mg by mouth daily.    Historical Provider, MD  furosemide (LASIX) 40 MG tablet Take 40 mg by mouth daily.     Historical Provider, MD  HYDROcodone-acetaminophen (NORCO) 5-325 MG tablet Take 1 tablet by mouth every 6 (six) hours as needed for severe pain. 01/29/15    Sherwood Gambler, MD  isosorbide mononitrate (IMDUR) 30 MG 24 hr tablet Take 60 mg by mouth daily.     Historical Provider, MD  Liraglutide (VICTOZA) 18 MG/3ML SOPN Inject 1.2 mg into the skin daily.     Historical Provider, MD  losartan (COZAAR) 25 MG tablet Take 25 mg by mouth daily.    Historical Provider, MD  memantine [facility] TITRATION PAK) tablet pack 5 mg/day for =1 week; 5 mg twice daily for =1 week; 15 mg/day given in 5 mg and 10 mg separated doses for =1 week; then 10 mg twice daily 11/02/15   Pieter Partridge, DO  metoprolol succinate (TOPROL-XL) 25 MG 24 hr tablet Take 25 mg by mouth 2 (two) times daily.     Historical Provider, MD  Multiple Vitamins-Minerals (MULTIVITAMIN WITH MINERALS) tablet Take 1 tablet by mouth daily.    Historical Provider, MD  nitroGLYCERIN (NITROSTAT) 0.4 MG SL tablet Place 1 tablet (0.4 mg total) under the tongue every 5 (five) minutes as needed for chest pain. 05/19/14   Jerline Pain, MD  pantoprazole (PROTONIX) 40 MG tablet Take 40 mg by mouth daily. Reported on 04/20/2015    Historical Provider, MD  potassium chloride (KLOR-CON) 20 MEQ packet Take 40 mEq by mouth 2 (two) times daily.     Historical Provider, MD  pregabalin (LYRICA) 100 MG capsule Take 1 capsule (100 mg total) by mouth 2 (two) times daily. 07/25/16   Orpah Greek, MD  propranolol ER (INDERAL LA) 60 MG 24 hr capsule TAKE 1 CAPSULE(60 MG) BY MOUTH DAILY Patient not taking: Reported on 05/09/2016 01/16/15   Pieter Partridge, DO  Rivastigmine 13.3 MG/24HR PT24 Place 1 patch (13.3 mg total) onto the skin daily. 04/27/15   Pieter Partridge, DO  Rivastigmine 13.3 MG/24HR PT24 PLACE 1 PATCH ON SKIN AND CHANGE EVERY 24 HOURS 10/12/15   Pieter Partridge, DO  Rivastigmine 13.3 MG/24HR PT24 PLACE 1 PATCH ON SKIN AND CHANGE EVERY 24 HOURS 11/30/15   Pieter Partridge, DO  spironolactone (ALDACTONE) 25 MG tablet Take 25 mg by mouth 2 (two) times daily.     Historical Provider, MD  TOUJEO SOLOSTAR 300 UNIT/ML SOPN Inject 80 mg  into the skin at bedtime.  05/22/14   Historical Provider, MD  zolpidem (AMBIEN) 5 MG tablet Take 5 mg by mouth at bedtime as needed for sleep.    Historical Provider, MD    Family History Family History  Problem Relation Age of Onset  .  Dementia Maternal Grandmother   . Heart disease Mother   . Heart disease Sister   . Alzheimer's disease Sister   . Dementia Maternal Aunt   . Mental illness Other     Social History Social History  Substance Use Topics  . Smoking status: Former Smoker    Packs/day: 1.50    Years: 18.00    Types: Cigarettes    Quit date: 03/31/1968  . Smokeless tobacco: Never Used  . Alcohol use No     Allergies   Avandia [rosiglitazone]; Benazepril; Dilaudid [hydromorphone hcl]; Fosamax [alendronate sodium]; Talwin [pentazocine]; Tetanus toxoids; and Zyrtec [cetirizine]   Review of Systems Review of Systems  Musculoskeletal: Positive for back pain.  Neurological: Positive for headaches.  All other systems reviewed and are negative.    Physical Exam Updated Vital Signs BP (!) 116/54   Pulse 62   Temp 98.3 F (36.8 C)   Resp 20   SpO2 93%   Physical Exam  Constitutional: She is oriented to person, place, and time. She appears well-developed and well-nourished. No distress.  HENT:  Head: Normocephalic and atraumatic.  Right Ear: Hearing normal.  Left Ear: Hearing normal.  Nose: Nose normal.  Mouth/Throat: Oropharynx is clear and moist and mucous membranes are normal.  Eyes: Conjunctivae and EOM are normal. Pupils are equal, round, and reactive to light.  Neck: Normal range of motion. Neck supple.  Cardiovascular: Regular rhythm, S1 normal and S2 normal.  Exam reveals no gallop and no friction rub.   No murmur heard. Pulmonary/Chest: Effort normal and breath sounds normal. No respiratory distress. She exhibits no tenderness.  Abdominal: Soft. Normal appearance and bowel sounds are normal. There is no hepatosplenomegaly. There is no tenderness.  There is no rebound, no guarding, no tenderness at McBurney's point and negative Murphy's sign. No hernia.  Musculoskeletal: Normal range of motion.       Lumbar back: She exhibits tenderness.       Back:  Neurological: She is alert and oriented to person, place, and time. She has normal strength. No cranial nerve deficit or sensory deficit. Coordination normal. GCS eye subscore is 4. GCS verbal subscore is 5. GCS motor subscore is 6.  Skin: Skin is warm, dry and intact. No rash noted. No cyanosis.  Psychiatric: She has a normal mood and affect. Her speech is normal and behavior is normal. Thought content normal.  Nursing note and vitals reviewed.    ED Treatments / Results  Labs (all labs ordered are listed, but only abnormal results are displayed) Labs Reviewed - No data to display  EKG  EKG Interpretation None       Radiology Dg Lumbar Spine Complete  Result Date: 07/25/2016 CLINICAL DATA:  83 year old female with fall and back pain. EXAM: LUMBAR SPINE - COMPLETE 4+ VIEW COMPARISON:  Lumbar spine radiograph dated 03/23/2014 FINDINGS: Evaluation is limited due to soft tissue attenuation and patient's body habitus. There is no acute fracture or subluxation of the lumbar spine. Multilevel degenerative changes most prominent at L2-L3 with disc space narrowing, disc desiccation, and endplate irregularity. There is osteophyte formation with a grade 1 retrolisthesis at this level with resulting mild levoscoliosis similar to the prior radiograph. Lower lumbar facet hypertrophy noted. The visualized transverse and spinous processes appear intact. There is atherosclerotic calcification of the aorta. The ventral hernia repair mesh noted. IMPRESSION: No acute/traumatic lumbar spine pathology. Stable chronic changes of the lumbar spine. Electronically Signed   By: Anner Crete M.D.   On:  07/25/2016 05:37   Ct Head Wo Contrast  Result Date: 07/25/2016 CLINICAL DATA:  83 year old female with  fall. EXAM: CT HEAD WITHOUT CONTRAST CT CERVICAL SPINE WITHOUT CONTRAST TECHNIQUE: Multidetector CT imaging of the head and cervical spine was performed following the standard protocol without intravenous contrast. Multiplanar CT image reconstructions of the cervical spine were also generated. COMPARISON:  Head CT dated 01/29/2015 FINDINGS: CT HEAD FINDINGS Brain: There is moderate age-related atrophy and chronic microvascular ischemic changes. No acute intracranial hemorrhage. No mass effect or midline shift noted. No extra-axial fluid collection. Vascular: No hyperdense vessel or unexpected calcification. Skull: Normal. Negative for fracture or focal lesion. There is hyperostosis frontalis. Sinuses/Orbits: No acute finding. Other: None CT CERVICAL SPINE FINDINGS Alignment: No acute subluxation. Skull base and vertebrae: No acute fracture. Soft tissues and spinal canal: No prevertebral fluid or swelling. No visible canal hematoma. Disc levels: Mild multilevel degenerative changes. There is endplate irregularity and mild disc space narrowing most prominent at C4-C5 and C5-C6. Multilevel facet hypertrophy. Upper chest: Negative. Other: None IMPRESSION: 1. No acute intracranial hemorrhage. Moderate age-related atrophy and chronic microvascular ischemic changes. 2. No acute/traumatic cervical spine pathology. Degenerative changes. Electronically Signed   By: Anner Crete M.D.   On: 07/25/2016 05:54   Ct Cervical Spine Wo Contrast  Result Date: 07/25/2016 CLINICAL DATA:  83 year old female with fall. EXAM: CT HEAD WITHOUT CONTRAST CT CERVICAL SPINE WITHOUT CONTRAST TECHNIQUE: Multidetector CT imaging of the head and cervical spine was performed following the standard protocol without intravenous contrast. Multiplanar CT image reconstructions of the cervical spine were also generated. COMPARISON:  Head CT dated 01/29/2015 FINDINGS: CT HEAD FINDINGS Brain: There is moderate age-related atrophy and chronic  microvascular ischemic changes. No acute intracranial hemorrhage. No mass effect or midline shift noted. No extra-axial fluid collection. Vascular: No hyperdense vessel or unexpected calcification. Skull: Normal. Negative for fracture or focal lesion. There is hyperostosis frontalis. Sinuses/Orbits: No acute finding. Other: None CT CERVICAL SPINE FINDINGS Alignment: No acute subluxation. Skull base and vertebrae: No acute fracture. Soft tissues and spinal canal: No prevertebral fluid or swelling. No visible canal hematoma. Disc levels: Mild multilevel degenerative changes. There is endplate irregularity and mild disc space narrowing most prominent at C4-C5 and C5-C6. Multilevel facet hypertrophy. Upper chest: Negative. Other: None IMPRESSION: 1. No acute intracranial hemorrhage. Moderate age-related atrophy and chronic microvascular ischemic changes. 2. No acute/traumatic cervical spine pathology. Degenerative changes. Electronically Signed   By: Anner Crete M.D.   On: 07/25/2016 05:54   Dg Toe Great Left  Result Date: 07/25/2016 CLINICAL DATA:  83 year old female with fall and trauma to the left great toe. EXAM: LEFT GREAT TOE COMPARISON:  Left foot radiograph dated 12/19/2013 FINDINGS: There is no acute fracture or dislocation. There is mild osteopenia. Mild degenerative changes of the first MTP and PIP joints. There is mild soft tissue swelling of the toe. No radiopaque foreign object or soft tissue gas. IMPRESSION: No acute/ traumatic osseous pathology. Electronically Signed   By: Anner Crete M.D.   On: 07/25/2016 05:34    Procedures Procedures (including critical care time)  Medications Ordered in ED Medications  LORazepam (ATIVAN) tablet 0.5 mg (0.5 mg Sublingual Not Given 07/25/16 3748)     Initial Impression / Assessment and Plan / ED Course  I have reviewed the triage vital signs and the nursing notes.  Pertinent labs & imaging results that were available during my care of the  patient were reviewed by me and considered in my  medical decision making (see chart for details).     Presents to the emergency department after a fall. Patient reports falling out of bed. She is complaining of low back pain primarily, but does have a slight headache and some neck pain. Neck examination revealed diffuse soft tissue tenderness. Patient is on now across, CT head and cervical spine therefore performed. No acute abnormality noted. Lumbar x-ray negative for acute fracture. Patient complaining of great toe pain, this actually occurred with a fall several weeks ago. X-ray performed does not show fracture.  Patient's son is not present in the ER. The patient and the son are concerned that she might be having some symptoms of withdrawal. She was on Lyrica 100 mg in the morning and 200 mg at night for her peripheral neuropathy. Her doctor thought she might be having some problems with swallowing secondary to the medication, stopped it. She has not tapered off and she is experiencing nausea and poor appetite. I do feel that there is a strong likelihood of some withdrawal symptoms with this medication, will restart at 100 mg twice daily. This can be tapered further in the near future by primary care.  Final Clinical Impressions(s) / ED Diagnoses   Final diagnoses:  Lumbar contusion, initial encounter    New Prescriptions New Prescriptions   PREGABALIN (LYRICA) 100 MG CAPSULE    Take 1 capsule (100 mg total) by mouth 2 (two) times daily.     Orpah Greek, MD 07/25/16 [phone]

## 2016-08-05 DIAGNOSIS — E1142 Type 2 diabetes mellitus with diabetic polyneuropathy: Secondary | ICD-10-CM | POA: Diagnosis not present

## 2016-08-05 DIAGNOSIS — R072 Precordial pain: Secondary | ICD-10-CM | POA: Diagnosis not present

## 2016-08-05 DIAGNOSIS — I119 Hypertensive heart disease without heart failure: Secondary | ICD-10-CM | POA: Diagnosis not present

## 2016-08-05 DIAGNOSIS — E782 Mixed hyperlipidemia: Secondary | ICD-10-CM | POA: Diagnosis not present

## 2016-08-26 DIAGNOSIS — G4733 Obstructive sleep apnea (adult) (pediatric): Secondary | ICD-10-CM | POA: Diagnosis not present

## 2016-09-04 ENCOUNTER — Ambulatory Visit (INDEPENDENT_AMBULATORY_CARE_PROVIDER_SITE_OTHER): Payer: Medicare Other | Admitting: Podiatry

## 2016-09-04 DIAGNOSIS — I872 Venous insufficiency (chronic) (peripheral): Secondary | ICD-10-CM

## 2016-09-04 DIAGNOSIS — B351 Tinea unguium: Secondary | ICD-10-CM | POA: Diagnosis not present

## 2016-09-04 DIAGNOSIS — M79676 Pain in unspecified toe(s): Secondary | ICD-10-CM | POA: Diagnosis not present

## 2016-09-04 DIAGNOSIS — M2042 Other hammer toe(s) (acquired), left foot: Secondary | ICD-10-CM

## 2016-09-04 DIAGNOSIS — E1149 Type 2 diabetes mellitus with other diabetic neurological complication: Secondary | ICD-10-CM

## 2016-09-04 DIAGNOSIS — M2041 Other hammer toe(s) (acquired), right foot: Secondary | ICD-10-CM

## 2016-09-07 NOTE — Progress Notes (Signed)
Subjective: 83 y.o. returns the office today for painful, elongated, thickened toenails which she cannot trim herself. Denies any redness or drainage around the nails. She has not yet received diabetic shoes and is awaiting this. Denies any acute changes since last appointment and no new complaints today. Denies any systemic complaints such as fevers, chills, nausea, vomiting.   Objective: AAO 3, NAD DP/PT pulses palpable, CRT less than 3 seconds Sensation decreased with SWMF Nails hypertrophic, dystrophic, elongated, brittle, discolored 8. There is tenderness overlying the nails 1-5 on the right and 3-5 on the left. There is no surrounding erythema or drainage along the nail sites. Hammertoes are present.  There is chronic discoloration likely from venous disease to bilateral legs. There is no pain with calf compression, erythema, warmth. No open lesions or pre-ulcerative lesions are identified. No other areas of tenderness bilateral lower extremities. No overlying edema, erythema, increased warmth.  Assessment: Patient presents with symptomatic onychomycosis; diabetic foot exam; venous insufficiency  Plan: -Treatment options including alternatives, risks, complications were discussed -Nails sharply debrided 8 without complication/bleeding. -I had to redo the paperwork today as there was a issue with this. I did go ahead and remeasure her today so we would have the measurements for 1 discussed approved. -Did not get venous reflux study; no calf pain and no evidence of DVT otherwise. Continue compression and elevation.  -Discussed daily foot inspection. If there are any changes, to call the office immediately.  -Follow-up in 3 months or sooner if any problems are to arise. In the meantime, encouraged to call the office with any questions, concerns, changes symptoms.  Celesta Gentile, DPM

## 2016-09-11 DIAGNOSIS — I739 Peripheral vascular disease, unspecified: Secondary | ICD-10-CM | POA: Diagnosis not present

## 2016-09-11 DIAGNOSIS — M2010 Hallux valgus (acquired), unspecified foot: Secondary | ICD-10-CM | POA: Diagnosis not present

## 2016-09-11 DIAGNOSIS — E118 Type 2 diabetes mellitus with unspecified complications: Secondary | ICD-10-CM | POA: Diagnosis not present

## 2016-09-11 DIAGNOSIS — G609 Hereditary and idiopathic neuropathy, unspecified: Secondary | ICD-10-CM | POA: Diagnosis not present

## 2016-09-15 DIAGNOSIS — L821 Other seborrheic keratosis: Secondary | ICD-10-CM | POA: Diagnosis not present

## 2016-09-15 DIAGNOSIS — Z85828 Personal history of other malignant neoplasm of skin: Secondary | ICD-10-CM | POA: Diagnosis not present

## 2016-09-15 DIAGNOSIS — D692 Other nonthrombocytopenic purpura: Secondary | ICD-10-CM | POA: Diagnosis not present

## 2016-09-15 DIAGNOSIS — L02225 Furuncle of perineum: Secondary | ICD-10-CM | POA: Diagnosis not present

## 2016-09-15 DIAGNOSIS — L02214 Cutaneous abscess of groin: Secondary | ICD-10-CM | POA: Diagnosis not present

## 2016-09-26 ENCOUNTER — Telehealth: Payer: Self-pay | Admitting: Podiatry

## 2016-09-26 DIAGNOSIS — M545 Low back pain: Secondary | ICD-10-CM | POA: Diagnosis not present

## 2016-09-26 DIAGNOSIS — M5416 Radiculopathy, lumbar region: Secondary | ICD-10-CM | POA: Diagnosis not present

## 2016-09-26 NOTE — Telephone Encounter (Signed)
pts son [name] called to check on status of diabetic shoes. He said hers are about 81 yrs old.

## 2016-10-03 DIAGNOSIS — B49 Unspecified mycosis: Secondary | ICD-10-CM | POA: Diagnosis not present

## 2016-10-03 DIAGNOSIS — R300 Dysuria: Secondary | ICD-10-CM | POA: Diagnosis not present

## 2016-10-07 DIAGNOSIS — N183 Chronic kidney disease, stage 3 (moderate): Secondary | ICD-10-CM | POA: Diagnosis not present

## 2016-10-07 DIAGNOSIS — E1142 Type 2 diabetes mellitus with diabetic polyneuropathy: Secondary | ICD-10-CM | POA: Diagnosis not present

## 2016-10-07 DIAGNOSIS — Z5181 Encounter for therapeutic drug level monitoring: Secondary | ICD-10-CM | POA: Diagnosis not present

## 2016-10-07 DIAGNOSIS — Z794 Long term (current) use of insulin: Secondary | ICD-10-CM | POA: Diagnosis not present

## 2016-10-13 DIAGNOSIS — B49 Unspecified mycosis: Secondary | ICD-10-CM | POA: Diagnosis not present

## 2016-10-29 DIAGNOSIS — R600 Localized edema: Secondary | ICD-10-CM | POA: Diagnosis not present

## 2016-11-07 DIAGNOSIS — L821 Other seborrheic keratosis: Secondary | ICD-10-CM | POA: Diagnosis not present

## 2016-11-07 DIAGNOSIS — D225 Melanocytic nevi of trunk: Secondary | ICD-10-CM | POA: Diagnosis not present

## 2016-11-07 DIAGNOSIS — Z85828 Personal history of other malignant neoplasm of skin: Secondary | ICD-10-CM | POA: Diagnosis not present

## 2016-11-07 DIAGNOSIS — L814 Other melanin hyperpigmentation: Secondary | ICD-10-CM | POA: Diagnosis not present

## 2016-11-13 ENCOUNTER — Ambulatory Visit
Admission: RE | Admit: 2016-11-13 | Discharge: 2016-11-13 | Disposition: A | Discharge status: Home / Self Care | Payer: Medicare Other | Source: Ambulatory Visit | Attending: Physician Assistant | Admitting: Physician Assistant

## 2016-11-13 ENCOUNTER — Other Ambulatory Visit: Payer: Self-pay | Admitting: Physician Assistant

## 2016-11-13 DIAGNOSIS — Z9181 History of falling: Secondary | ICD-10-CM | POA: Diagnosis not present

## 2016-11-13 DIAGNOSIS — M545 Low back pain: Secondary | ICD-10-CM | POA: Diagnosis not present

## 2016-11-13 DIAGNOSIS — E1142 Type 2 diabetes mellitus with diabetic polyneuropathy: Secondary | ICD-10-CM | POA: Diagnosis not present

## 2016-11-13 DIAGNOSIS — M25572 Pain in left ankle and joints of left foot: Secondary | ICD-10-CM | POA: Diagnosis not present

## 2016-11-13 DIAGNOSIS — S3992XA Unspecified injury of lower back, initial encounter: Secondary | ICD-10-CM | POA: Diagnosis not present

## 2016-11-20 ENCOUNTER — Ambulatory Visit (INDEPENDENT_AMBULATORY_CARE_PROVIDER_SITE_OTHER): Payer: Medicare Other | Admitting: Podiatry

## 2016-11-20 ENCOUNTER — Other Ambulatory Visit: Payer: Medicare Other | Admitting: Orthotics

## 2016-11-20 ENCOUNTER — Ambulatory Visit (INDEPENDENT_AMBULATORY_CARE_PROVIDER_SITE_OTHER): Payer: Medicare Other

## 2016-11-20 DIAGNOSIS — M84375A Stress fracture, left foot, initial encounter for fracture: Secondary | ICD-10-CM

## 2016-11-20 DIAGNOSIS — R609 Edema, unspecified: Secondary | ICD-10-CM | POA: Diagnosis not present

## 2016-11-21 NOTE — Progress Notes (Signed)
Subjective: Kara Ortega presents the also to see Kara Ortega for diabetic shoes. However upon her appointment today she was stating that she's been having pain to her left foot which is been worsening over the last 4 weeks. She states that she presents an x-ray which not reveal fracture but she is concerned the left foot is painful to put pressure on and there is disc continue swelling to the foot. She denies any redness or warmth and she denies any recent injury,. Denies any systemic complaints such as fevers, chills, nausea, vomiting. No acute changes since last appointment, and no other complaints at this time.   Objective: AAO x3, NAD DP/PT pulses palpable bilaterally, CRT less than 3 seconds There is moderate edema to the left foot and there is no erythema or increase in warmth. There is pinpoint tenderness on the third and fourth metatarsal bases. There is mild discomfort of the sinus tarsi. There is able to range of motion a total joint range of motion. Does no other area of tenderness. No open lesions or pre-ulcerative lesions.  No pain with calf compression, swelling, warmth, erythema  Assessment: I did stress fracture metatarsal base left foot  Plan: -All treatment options discussed with the patient including all alternatives, risks, complications.  -X-rays obtained and reviewed. The does appear to be a radiolucent line and third metatarsal base concerning for fracture. -At this point given the pinpoint tenderness she has as well as swelling still concerned about stress fracture. Recommend immobilization in a boot but she cannot do this for fear falling a surgical shoe was dispensed today. Elevation, limited activity. Ice to the area. -Follow-up as scheduled or sooner if needed. -Patient encouraged to call the office with any questions, concerns, change in symptoms.   Celesta Gentile, DPM

## 2016-11-28 ENCOUNTER — Telehealth: Payer: Self-pay | Admitting: Podiatry

## 2016-11-28 NOTE — Telephone Encounter (Signed)
I informed pt that she had a stress fracture, essentially a break that occurred without known injury. Pt states Dr. Jacqualyn Posey put her in shoe not a cast and she has been sleeping in it. I told pt to rest, ice and elevate, but she didn't have to be in the shoe to sleep. I asked pt if she had been continuing her normal activities and she said she had except for exercising. I told pt to just do her acts of daily living, no shopping, touring or long periods of standing and walking. Pt states she has had several meals delivered to the room, and wanted to know if she should continue that with all meals. I told her that if she felt she could go for one meal to keep herself social with a little bit of walking type of exercise one meal in the cafeteria would be fine. Pt states understanding.

## 2016-11-28 NOTE — Telephone Encounter (Signed)
I saw Dr. Jacqualyn Posey and he diagnosed me with what other providers have called a hairline fracture but he called it something else. Well he put me in a short little cast and I meant to call to see if I need to take it off or keep it on. I ended up keeping in on and I finally took it off last night. My foot was swollen twice its normal size. I don't know what I should do. I live in an assisted living facility. You can call me back at [phone]. Thank you.

## 2016-12-04 ENCOUNTER — Ambulatory Visit (INDEPENDENT_AMBULATORY_CARE_PROVIDER_SITE_OTHER): Payer: Medicare Other

## 2016-12-04 ENCOUNTER — Ambulatory Visit (INDEPENDENT_AMBULATORY_CARE_PROVIDER_SITE_OTHER): Payer: Medicare Other | Admitting: Podiatry

## 2016-12-04 DIAGNOSIS — M84375A Stress fracture, left foot, initial encounter for fracture: Secondary | ICD-10-CM

## 2016-12-04 DIAGNOSIS — M84375D Stress fracture, left foot, subsequent encounter for fracture with routine healing: Secondary | ICD-10-CM

## 2016-12-05 ENCOUNTER — Ambulatory Visit: Payer: Medicare Other | Admitting: Neurology

## 2016-12-08 NOTE — Progress Notes (Signed)
Subjective: Ms. Mainor presents the office today with her caregiver for follow-up evaluation of left foot pain. She states that the pain has improved well she does continue pain the left foot. She denies any swelling or redness. She's been trying to limit her activity. She has no new concerns. Denies any systemic complaints such as fevers, chills, nausea, vomiting. No acute changes since last appointment, and no other complaints at this time.   Objective: AAO x3, NAD DP/PT pulses palpable bilaterally, CRT less than 3 seconds There does continue to be tenderness to the left third and fourth metatarsals on the shaft. Overall pain appears be improved subjectively. There is no overlying edema, erythema, increase in warmth. There is no pain with ankle. The area tenderness identified at this time. Hallux varus is present on the left foot there is a decrease in medial arch height. No open lesions or pre-ulcerative lesions.  No pain with calf compression, swelling, warmth, erythema  Assessment: Left foot pain, stress fracture likely due to biomechanical changes.  Plan: -All treatment options discussed with the patient including all alternatives, risks, complications.  -X-rays were obtained and reviewed with the patient. No definitive evidence of acute stress fracture identified today. Given her continued pain I directly continue mobilization a surgical shoe. Next 1-2 weeks as her pain is certainly approved she can try to go back into a regular shoe but must wear supportive shoe. She is awaiting her diabetic shoes and inserts as well which will hopefully be beneficial for her. -Patient encouraged to call the office with any questions, concerns, change in symptoms.   Celesta Gentile, DPM

## 2016-12-10 ENCOUNTER — Ambulatory Visit: Payer: Medicare Other | Admitting: Neurology

## 2016-12-10 DIAGNOSIS — Z029 Encounter for administrative examinations, unspecified: Secondary | ICD-10-CM

## 2016-12-15 DIAGNOSIS — G609 Hereditary and idiopathic neuropathy, unspecified: Secondary | ICD-10-CM | POA: Diagnosis not present

## 2016-12-15 DIAGNOSIS — M2010 Hallux valgus (acquired), unspecified foot: Secondary | ICD-10-CM | POA: Diagnosis not present

## 2016-12-15 DIAGNOSIS — E118 Type 2 diabetes mellitus with unspecified complications: Secondary | ICD-10-CM | POA: Diagnosis not present

## 2016-12-15 DIAGNOSIS — I739 Peripheral vascular disease, unspecified: Secondary | ICD-10-CM | POA: Diagnosis not present

## 2016-12-17 ENCOUNTER — Ambulatory Visit: Payer: Medicare Other | Admitting: Nurse Practitioner

## 2016-12-17 ENCOUNTER — Encounter: Payer: Self-pay | Admitting: Neurology

## 2016-12-25 ENCOUNTER — Ambulatory Visit: Payer: Medicare Other | Admitting: Orthotics

## 2016-12-25 ENCOUNTER — Ambulatory Visit: Payer: Medicare Other | Admitting: Podiatry

## 2017-01-10 DIAGNOSIS — K625 Hemorrhage of anus and rectum: Secondary | ICD-10-CM | POA: Diagnosis not present

## 2017-01-10 DIAGNOSIS — K649 Unspecified hemorrhoids: Secondary | ICD-10-CM | POA: Diagnosis not present

## 2017-01-12 ENCOUNTER — Ambulatory Visit: Payer: Medicare Other | Admitting: Podiatry

## 2017-01-12 ENCOUNTER — Ambulatory Visit: Payer: Medicare Other | Admitting: Orthotics

## 2017-01-12 DIAGNOSIS — N183 Chronic kidney disease, stage 3 (moderate): Secondary | ICD-10-CM | POA: Diagnosis not present

## 2017-01-12 DIAGNOSIS — Z5181 Encounter for therapeutic drug level monitoring: Secondary | ICD-10-CM | POA: Diagnosis not present

## 2017-01-12 DIAGNOSIS — Z794 Long term (current) use of insulin: Secondary | ICD-10-CM | POA: Diagnosis not present

## 2017-01-12 DIAGNOSIS — E1142 Type 2 diabetes mellitus with diabetic polyneuropathy: Secondary | ICD-10-CM | POA: Diagnosis not present

## 2017-01-19 ENCOUNTER — Ambulatory Visit: Payer: Medicare Other | Admitting: Nurse Practitioner

## 2017-01-29 ENCOUNTER — Ambulatory Visit (INDEPENDENT_AMBULATORY_CARE_PROVIDER_SITE_OTHER): Payer: Medicare Other | Admitting: Podiatry

## 2017-01-29 ENCOUNTER — Encounter: Payer: Self-pay | Admitting: Podiatry

## 2017-01-29 ENCOUNTER — Ambulatory Visit: Payer: Medicare Other

## 2017-01-29 ENCOUNTER — Ambulatory Visit (INDEPENDENT_AMBULATORY_CARE_PROVIDER_SITE_OTHER): Payer: Medicare Other

## 2017-01-29 DIAGNOSIS — M659 Synovitis and tenosynovitis, unspecified: Secondary | ICD-10-CM | POA: Diagnosis not present

## 2017-01-29 DIAGNOSIS — M19079 Primary osteoarthritis, unspecified ankle and foot: Secondary | ICD-10-CM

## 2017-01-29 DIAGNOSIS — M779 Enthesopathy, unspecified: Secondary | ICD-10-CM

## 2017-01-29 DIAGNOSIS — I739 Peripheral vascular disease, unspecified: Secondary | ICD-10-CM | POA: Diagnosis not present

## 2017-01-29 NOTE — Progress Notes (Signed)
Subjective: Kara Ortega presents the office today with her caregiver for concerns of continued left foot pain. She still awaiting her shoes of the arm backorder. She states that she has pain mostly also asked her foot. This is in the left foot. She has noticed some bruising to the right foot but she denies any recent injury or trauma and the bruising is localized to the toes. She's had no other treatments last saw her. She has no other concerns today. Denies any systemic complaints such as fevers, chills, nausea, vomiting. No acute changes since last appointment, and no other complaints at this time.   Objective: AAO x3, NAD DP/PT pulses palpable bilaterally, CRT less than 3 seconds Significant decrease in medial arch height is present. There is hallux varus present on the left side. Decreased range of motions the toe joint is tenderness on the lateral aspect of the foot on the sinus tarsi. Then will discomfort of the ankle joint along the anterior lateral aspect. No specific area pinpoint bony tenderness or pain the vibratory sensation. On the right side there is ecchymosis present of the digits but there is no area of tenderness identified at this time. There isno skin swelling present bilaterally there is no erythema or increase in warmth. This source identified. No open lesions or pre-ulcerative lesions.  No pain with calf compression, swelling, warmth, erythema  Assessment: Capsulitis left foot; bruising right foot  Plan: -All treatment options discussed with the patient including all alternatives, risks, complications.  -X-rays were obtained and reviewed bilaterally. There is no evidence of acute fracture or stress fracture.5 today. Hallux there is present on the left side as well as medial deviation of the digits with the left side worse than the right. There is arthritic changes present the subtalar joint and rear foot. -I did ABI in the office they do to vessel calcifications on x-ray. ABI  was1.23 the right was 1.29. Due to movement of his rechecked in the left of 1.18 andthe right was 1.17. -Discussed steroid injection into the sinus tarsi is on the left foot as this is what majority of her tenderness is localized. After verbal consent was obtained Betadine preparation was performed and a mixture of Kenalog 10 local anesthetic was infiltrated into the sinus tarsi on the left foot without, occasions. Post injection care was discussed. -Discussed and ankle brace for left side. -No pain on the right side. This could be due to an old injury that she was not aware of and given neuropathy. She'll continue to monitor. -Patient encouraged to call the office with any questions, concerns, change in symptoms.   Celesta Gentile, DPM

## 2017-01-30 DIAGNOSIS — H01004 Unspecified blepharitis left upper eyelid: Secondary | ICD-10-CM | POA: Diagnosis not present

## 2017-01-30 DIAGNOSIS — H04123 Dry eye syndrome of bilateral lacrimal glands: Secondary | ICD-10-CM | POA: Diagnosis not present

## 2017-01-30 DIAGNOSIS — H01001 Unspecified blepharitis right upper eyelid: Secondary | ICD-10-CM | POA: Diagnosis not present

## 2017-02-04 ENCOUNTER — Telehealth: Payer: Self-pay | Admitting: Podiatry

## 2017-02-04 NOTE — Telephone Encounter (Signed)
Left voicemail for pt/caregiver to call to schedule an appt to pick up diabetic shoes. They are in.

## 2017-02-09 DIAGNOSIS — I5032 Chronic diastolic (congestive) heart failure: Secondary | ICD-10-CM | POA: Diagnosis not present

## 2017-02-09 DIAGNOSIS — E782 Mixed hyperlipidemia: Secondary | ICD-10-CM | POA: Diagnosis not present

## 2017-02-09 DIAGNOSIS — F329 Major depressive disorder, single episode, unspecified: Secondary | ICD-10-CM | POA: Diagnosis not present

## 2017-02-09 DIAGNOSIS — Z23 Encounter for immunization: Secondary | ICD-10-CM | POA: Diagnosis not present

## 2017-02-12 ENCOUNTER — Encounter: Payer: Self-pay | Admitting: Podiatry

## 2017-02-12 ENCOUNTER — Ambulatory Visit (INDEPENDENT_AMBULATORY_CARE_PROVIDER_SITE_OTHER): Payer: Medicare Other | Admitting: Podiatry

## 2017-02-12 DIAGNOSIS — M2140 Flat foot [pes planus] (acquired), unspecified foot: Secondary | ICD-10-CM

## 2017-02-12 DIAGNOSIS — M2042 Other hammer toe(s) (acquired), left foot: Secondary | ICD-10-CM

## 2017-02-12 DIAGNOSIS — E1149 Type 2 diabetes mellitus with other diabetic neurological complication: Secondary | ICD-10-CM | POA: Diagnosis not present

## 2017-02-12 DIAGNOSIS — M19079 Primary osteoarthritis, unspecified ankle and foot: Secondary | ICD-10-CM

## 2017-02-12 DIAGNOSIS — M2041 Other hammer toe(s) (acquired), right foot: Secondary | ICD-10-CM

## 2017-02-13 NOTE — Progress Notes (Signed)
Ms. Poudrier presents the office to pick up diabetic shoes and inserts.  She states that overall she is doing better she only has the one sore spot on the top of her foot.  Upon trying that she is today she states that it felt very good and she is very happy with the fit.  She states that her good arch support and overall her foot does feel better while wearing the shoes and inserts.  I did dispense a small gel cushion to put on top of the foot as well to help protect any bony prominence that may be causing pain.  Discussed her to look at her feet on a daily basis for any changes any skin irritation or blistering to call the office immediately should any occur.  Should this occur we will get her back in for modifications or adjustments of her shoes.  At the end of the visit today she is very happy with the shoes and she states that her foot felt better.  Trula Slade DPM

## 2017-03-16 DIAGNOSIS — M2010 Hallux valgus (acquired), unspecified foot: Secondary | ICD-10-CM | POA: Diagnosis not present

## 2017-03-16 DIAGNOSIS — R600 Localized edema: Secondary | ICD-10-CM | POA: Diagnosis not present

## 2017-03-16 DIAGNOSIS — E118 Type 2 diabetes mellitus with unspecified complications: Secondary | ICD-10-CM | POA: Diagnosis not present

## 2017-03-16 DIAGNOSIS — I739 Peripheral vascular disease, unspecified: Secondary | ICD-10-CM | POA: Diagnosis not present

## 2017-03-25 DIAGNOSIS — E1142 Type 2 diabetes mellitus with diabetic polyneuropathy: Secondary | ICD-10-CM | POA: Diagnosis not present

## 2017-04-16 ENCOUNTER — Encounter: Payer: Self-pay | Admitting: Podiatry

## 2017-04-16 ENCOUNTER — Ambulatory Visit (INDEPENDENT_AMBULATORY_CARE_PROVIDER_SITE_OTHER): Payer: Medicare Other | Admitting: Podiatry

## 2017-04-16 DIAGNOSIS — B351 Tinea unguium: Secondary | ICD-10-CM | POA: Diagnosis not present

## 2017-04-16 DIAGNOSIS — E1149 Type 2 diabetes mellitus with other diabetic neurological complication: Secondary | ICD-10-CM

## 2017-04-16 DIAGNOSIS — M2140 Flat foot [pes planus] (acquired), unspecified foot: Secondary | ICD-10-CM

## 2017-04-16 DIAGNOSIS — M79676 Pain in unspecified toe(s): Secondary | ICD-10-CM | POA: Diagnosis not present

## 2017-04-16 MED ORDER — CAPSAICIN 0.075 % EX CREA
1.0000 | TOPICAL_CREAM | Freq: Two times a day (BID) | CUTANEOUS | 0 refills | Status: DC
Start: 2017-04-16 — End: 2017-05-18

## 2017-04-19 NOTE — Progress Notes (Signed)
Subjective: 84 y.o. returns the office today for painful, elongated, thickened toenails which she cannot trim herself. Denies any redness or drainage around the nails.  She states the new shoes to help her feet quite a bit but she does not wear them and she is concerned about getting them dirty.  Overall her foot pain has improved.  She denies any recent injury or trauma.  No increase in swelling or redness to her feet.  Denies any acute changes since last appointment and no new complaints today. Denies any systemic complaints such as fevers, chills, nausea, vomiting.   Objective: AAO 3, NAD DP/PT pulses palpable, CRT less than 3 seconds Sensation decreased with SWMF Nails hypertrophic, dystrophic, elongated, brittle, discolored 10. There is tenderness overlying the nails 1-5 bilaterally. There is no surrounding erythema or drainage along the nail sites. Hammertoes are present.  Significant flatfoot deformity is present as well as digital deformities. No open lesions or pre-ulcerative lesions are identified. No other areas of tenderness bilateral lower extremities. No overlying edema, erythema, increased warmth. No pain with calf compression, swelling, warmth, erythema.  Assessment: Patient presents with symptomatic onychomycosis; improving foot pain  Plan: -Treatment options including alternatives, risks, complications were discussed -Nails sharply debrided 10 without complication/bleeding. -She has new shoes that she do help her foot pain but she has not been wearing them as she will does not want to get the majority.  Discussed with her to start wearing the shoe gradually.  If there is any issues that she would call the office but she has been wearing some and is comfortable. -Discussed daily foot inspection. If there are any changes, to call the office immediately.  -Follow-up in 9 weeks or sooner if any problems are to arise. In the meantime, encouraged to call the office with any  questions, concerns, changes symptoms.  Celesta Gentile, DPM

## 2017-04-21 DIAGNOSIS — E1142 Type 2 diabetes mellitus with diabetic polyneuropathy: Secondary | ICD-10-CM | POA: Diagnosis not present

## 2017-04-21 DIAGNOSIS — Z794 Long term (current) use of insulin: Secondary | ICD-10-CM | POA: Diagnosis not present

## 2017-04-21 DIAGNOSIS — Z5181 Encounter for therapeutic drug level monitoring: Secondary | ICD-10-CM | POA: Diagnosis not present

## 2017-04-21 DIAGNOSIS — N183 Chronic kidney disease, stage 3 (moderate): Secondary | ICD-10-CM | POA: Diagnosis not present

## 2017-04-22 ENCOUNTER — Encounter: Payer: Self-pay | Admitting: Cardiology

## 2017-04-22 ENCOUNTER — Ambulatory Visit: Payer: Medicare Other | Admitting: Cardiology

## 2017-04-22 VITALS — Resp 16 | Ht 61.0 in | Wt 217.8 lb

## 2017-04-22 DIAGNOSIS — E119 Type 2 diabetes mellitus without complications: Secondary | ICD-10-CM | POA: Diagnosis not present

## 2017-04-22 DIAGNOSIS — Z794 Long term (current) use of insulin: Secondary | ICD-10-CM | POA: Diagnosis not present

## 2017-04-22 DIAGNOSIS — G300 Alzheimer's disease with early onset: Secondary | ICD-10-CM

## 2017-04-22 DIAGNOSIS — I4819 Other persistent atrial fibrillation: Secondary | ICD-10-CM

## 2017-04-22 DIAGNOSIS — F0280 Dementia in other diseases classified elsewhere without behavioral disturbance: Secondary | ICD-10-CM | POA: Diagnosis not present

## 2017-04-22 DIAGNOSIS — R079 Chest pain, unspecified: Secondary | ICD-10-CM

## 2017-04-22 DIAGNOSIS — I481 Persistent atrial fibrillation: Secondary | ICD-10-CM

## 2017-04-22 DIAGNOSIS — I350 Nonrheumatic aortic (valve) stenosis: Secondary | ICD-10-CM | POA: Diagnosis not present

## 2017-04-22 DIAGNOSIS — E782 Mixed hyperlipidemia: Secondary | ICD-10-CM

## 2017-04-22 MED ORDER — METOPROLOL SUCCINATE ER 25 MG PO TB24: 12.5000 mg | ORAL_TABLET | Freq: Every day | ORAL | 3 refills | Status: DC

## 2017-04-22 MED ORDER — DILTIAZEM HCL ER 180 MG PO CP24: 180.0000 mg | ORAL_CAPSULE | Freq: Every day | ORAL | 3 refills | Status: DC

## 2017-04-22 NOTE — Patient Instructions (Addendum)
Medication Instructions:  1. DECREASE DILTIAZEM 180 MG BY MOUTH ONCE DAILY 2. DECREASE METOPROLOL SUCCINATE 25 MG TAKE 12.5 MG (1/2 TABLET) BY MOUTH ONCE DAILY 3. IMDUR TAKE 60 MG BY MOUTH IN THE MORNING  Labwork: NONE ORDERED  Testing/Procedures: Your physician has requested that you have an echocardiogram. Echocardiography is a painless test that uses sound waves to create images of your heart. It provides your doctor with information about the size and shape of your heart and how well your heart's chambers and valves are working. This procedure takes approximately one hour. There are no restrictions for this procedure.  PLEASE SCHEDULE ECHO BEFORE APPOINTMENT WITH DR. Marlou Porch  Follow-Up: KEEP YOUR APPOINTMENT WITH DR. Marlou Porch ON May 18, 2017 AT 9:20 AM  Any Other Special Instructions Will Be Listed Below (If Applicable).  IF YOU FEEL LIKE YOU ARE GOING TO PASS OUT, GO TO [facility].    If you need a refill on your cardiac medications before your next appointment, please call your pharmacy.

## 2017-04-22 NOTE — Progress Notes (Signed)
Cardiology Office Note   Date:  04/22/2017   ID:  Kara Ortega, DOB January 08, 1930, MRN [id number]  PCP:  Carol Ada, MD  Cardiologist:  Dr. Marlou Porch    Chief Complaint  Patient presents with  . Shortness of Breath    chest pain  . Dizziness      History of Present Illness: Kara Ortega is a 84 y.o. female who presents for AS.  She hospitalized on 03/27/14 with acute kidney injury, atrial fibrillation , fall , weight gain, fluid overload paroxysmal atrial fibrillation   She also had moderately severe aortic stenosis and possible mitral stenosis.  We changed her over from Coumadin to Eliquis. Ease of use. Her son was present for discussion. Discussed bleeding risks. Continue to encourage conditioning. Aortic stenosis was moderate when last seen in 2017.    Today she complains of one near syncope rushing to leave her apartment.  She did not pass out but felt like she would.  She did keep walking.  She does not have much SOB.  But she does complain of chest pain, maybe twice a week.   She will take NTG and it is better.  She did not take any today.  She also has costochondritis and takes voltaren which resolves pain quickly.        Past Medical History:  Diagnosis Date  . A-fib ([place])   . Abnormal heart rhythms   . Alzheimer disease   . Anginal pain ([place])   . Back pain   . CHF (congestive heart failure) ([place])   . Chronic kidney disease    STAGE 3   . COPD (chronic obstructive pulmonary disease) ([place])   . Depression   . Diabetes ([place])    insulin dependent  . Hypertension   . Leg pain   . Memory loss   . Neuropathy   . Shortness of breath   . Sleep apnea    uses bipap X12 years.  . Stroke ([place])    LEFT SIDE RESIDUAL    Past Surgical History:  Procedure Laterality Date  . BREAST LUMPECTOMY    . BREAST SURGERY     X4  . COLON SURGERY     removed 14 inches of colon  . HERNIA REPAIR       Current Outpatient Medications  Medication Sig Dispense Refill    . acetaminophen (TYLENOL) 325 MG tablet Take 650 mg by mouth every 6 (six) hours as needed.    . ALPRAZolam (XANAX) 0.25 MG tablet Take 0.25 mg by mouth at bedtime.     Marland Kitchen apixaban (ELIQUIS) 5 MG TABS tablet Take 1 tablet (5 mg total) by mouth 2 (two) times daily. 180 tablet 3  . Ascorbic Acid (VITAMIN C) 1000 MG tablet Take 1,000 mg by mouth daily.    Marland Kitchen atorvastatin (LIPITOR) 10 MG tablet Take 10 mg by mouth at bedtime.     Marland Kitchen b complex vitamins tablet Take 1 tablet by mouth daily.    . B-D UF III MINI PEN NEEDLES 31G X 5 MM MISC U UTD TO INJECT INSULIN  5  . capsicum (ZOSTRIX) 0.075 % topical cream Apply 1 application topically 2 (two) times daily. 28.3 g 0  . Cholecalciferol (HM VITAMIN D3) 4000 UNITS CAPS Take 4,000 Units by mouth daily.     . citalopram (CELEXA) 20 MG tablet Take 20 mg by mouth daily.    Marland Kitchen COENZYME Q-10 PO Take 1 tablet by mouth.    . colchicine 0.6 MG  tablet Take 0.6 mg by mouth daily.     . diclofenac sodium (VOLTAREN) 1 % GEL Apply 4 g topically 4 (four) times daily as needed (For pain.).     Marland Kitchen febuxostat (ULORIC) 40 MG tablet Take 40 mg by mouth daily.    . fenofibrate micronized (LOFIBRA) 134 MG capsule Take 134 mg by mouth daily.    . fexofenadine (ALLEGRA) 180 MG tablet Take 180 mg by mouth daily.    . furosemide (LASIX) 40 MG tablet Take 40 mg by mouth daily.     Marland Kitchen HYDROcodone-acetaminophen (NORCO) 5-325 MG tablet Take 1 tablet by mouth every 6 (six) hours as needed for severe pain. 20 tablet 0  . hydrocortisone (ANUSOL-HC) 25 MG suppository Place 25 mg rectally 2 (two) times daily.    . insulin lispro (HUMALOG) 100 UNIT/ML injection Inject into the skin 3 (three) times daily before meals. Injects 16 units    . isosorbide mononitrate (IMDUR) 30 MG 24 hr tablet Take 60 mg by mouth daily.     . Liraglutide (VICTOZA) 18 MG/3ML SOPN Inject 1.2 mg into the skin daily.     Marland Kitchen loperamide (IMODIUM A-D) 2 MG tablet Take 2 mg by mouth 4 (four) times daily as needed for  diarrhea or loose stools.    Marland Kitchen losartan (COZAAR) 25 MG tablet Take 25 mg by mouth daily.    . memantine (NAMENDA TITRATION PAK) tablet pack 5 mg/day for =1 week; 5 mg twice daily for =1 week; 15 mg/day given in 5 mg and 10 mg separated doses for =1 week; then 10 mg twice daily 49 tablet 12  . methocarbamol (ROBAXIN) 500 MG tablet Take 500 mg by mouth 4 (four) times daily.    . Multiple Vitamins-Minerals (MULTIVITAMIN WITH MINERALS) tablet Take 1 tablet by mouth daily.    . nitroGLYCERIN (NITROSTAT) 0.4 MG SL tablet Place 1 tablet (0.4 mg total) under the tongue every 5 (five) minutes as needed for chest pain. 25 tablet prn  . pantoprazole (PROTONIX) 40 MG tablet Take 40 mg by mouth daily. Reported on 04/20/2015    . potassium chloride (KLOR-CON) 20 MEQ packet Take 40 mEq by mouth 2 (two) times daily.     . pregabalin (LYRICA) 100 MG capsule Take 1 capsule (100 mg total) by mouth 2 (two) times daily. 60 capsule 0  . propranolol ER (INDERAL LA) 60 MG 24 hr capsule TAKE 1 CAPSULE(60 MG) BY MOUTH DAILY 90 capsule 1  . Rivastigmine 13.3 MG/24HR PT24 Place 1 patch (13.3 mg total) onto the skin daily. 30 patch 0  . Rivastigmine 13.3 MG/24HR PT24 PLACE 1 PATCH ON SKIN AND CHANGE EVERY 24 HOURS 30 patch 0  . Rivastigmine 13.3 MG/24HR PT24 PLACE 1 PATCH ON SKIN AND CHANGE EVERY 24 HOURS 30 patch 5  . spironolactone (ALDACTONE) 25 MG tablet Take 25 mg by mouth 2 (two) times daily.     Nelva Nay SOLOSTAR 300 UNIT/ML SOPN Inject 80 mg into the skin at bedtime.   5  . traMADol (ULTRAM) 50 MG tablet Take 50 mg by mouth every 6 (six) hours as needed.    . vitamin B-12 (CYANOCOBALAMIN) 1000 MCG tablet Take 1,000 mcg by mouth daily.    Marland Kitchen zolpidem (AMBIEN) 5 MG tablet Take 5 mg by mouth at bedtime as needed for sleep.    Marland Kitchen diltiazem (DILACOR XR) 180 MG 24 hr capsule Take 1 capsule (180 mg total) by mouth daily. 90 capsule 3  . metoprolol succinate (TOPROL  XL) 25 MG 24 hr tablet Take 0.5 tablets (12.5 mg total) by  mouth daily. 45 tablet 3   No current facility-administered medications for this visit.     Allergies:   Avandia [rosiglitazone]; Benazepril; Dilaudid [hydromorphone hcl]; Fosamax [alendronate sodium]; Talwin [pentazocine]; Tetanus toxoids; and Zyrtec [cetirizine]    Social History:  The patient  reports that she quit smoking about 49 years ago. Her smoking use included cigarettes. She has a 27.00 pack-year smoking history. she has never used smokeless tobacco. She reports that she does not drink alcohol or use drugs.   Family History:  The patient's family history includes Alzheimer's disease in her sister; Dementia in her maternal aunt and maternal grandmother; Heart disease in her mother and sister; Mental illness in her other.    ROS:  General:no colds or fevers, + weight loss Skin:no rashes or ulcers HEENT:no blurred vision, no congestion CV:see HPI PUL:see HPI GI:no diarrhea constipation or melena, no indigestion GU:no hematuria, no dysuria MS:no joint pain, no claudication Neuro:no syncope, + lightheadedness Endo:+ diabetes, no thyroid disease  Wt Readings from Last 3 Encounters:  04/22/17 217 lb 12.8 oz (98.8 kg)  05/09/16 218 lb 7 oz (99.1 kg)  11/02/15 224 lb (101.6 kg)     PHYSICAL EXAM: VS:  Resp 16   Ht 5\' 1"  (1.549 m)   Wt 217 lb 12.8 oz (98.8 kg)   SpO2 95%   BMI 41.15 kg/m  , BMI Body mass index is 41.15 kg/m. General:Pleasant affect, NAD Skin:Warm and dry, brisk capillary refill HEENT:normocephalic, sclera clear, mucus membranes moist Neck:supple, no JVD, no bruits  Heart:S1S2 RRR with 2-3/6 aortic murmur, no gallup, rub or click Lungs:clear without rales, rhonchi, or wheezes CHY:IFOY, non tender, + BS, do not palpate liver spleen or masses Ext:no to trace lower ext edema, 2+ pedal pulses, 2+ radial pulses Neuro:alert and oriented X 3, MAE, follows commands, + facial symmetry  Lying BP 104/56 P 60 Sitting BP 86/40  P 61 Standing 80/38 P 64.    EKG:   EKG is ordered today. The ekg ordered today demonstrates SR and normal EKG no changes from previous.   Recent Labs: No results found for requested labs within last 8760 hours.    Lipid Panel    Component Value Date/Time   CHOL 169 05/19/2014 1510   TRIG (H) 05/19/2014 1510    555.0 Triglyceride is over 400; calculations on Lipids are invalid.   HDL 39.10 05/19/2014 1510   CHOLHDL 4 05/19/2014 1510   VLDL 35 09/04/2006 0315   LDLCALC (H) 09/04/2006 0315    140        Total Cholesterol/HDL:CHD Risk Coronary Heart Disease Risk Table                     Men   Women  1/2 Average Risk   3.4   3.3   LDLDIRECT 88.0 05/19/2014 1510       Other studies Reviewed: Additional studies/ records that were reviewed today include: . ECHO 05/04/15  Notes Recorded by Jerline Pain, MD on 05/04/2015 at 4:14 PM - Left ventricle: The cavity size was normal. There was mild concentric hypertrophy. Systolic function was normal. The estimated ejection fraction was in the range of 60% to 65%. Wall motion was normal; there were no regional wall motion abnormalities. Doppler parameters are consistent with abnormal left ventricular relaxation (grade 1 diastolic dysfunction). - Aortic valve: Valve mobility was restricted. There was moderate stenosis. Peak velocity (  S): 357 cm/s. Mean gradient (S): 32 mm Hg. - Mitral valve: Severely calcified annulus. - Left atrium: The atrium was mildly dilated. - Pulmonary arteries: Systolic pressure was mildly increased. PA peak pressure: 44 mm Hg (S).  Impressions:  - Aortic stenosis has increased. Mean gradient has increased from 26 mmHg.  ASSESSMENT AND PLAN:  1.  Aortic stenosis will check echo - need to monitor with symptoms below. Will have her follow up with Dr. Marlou Porch after the echo.   2.  Near syncope dizziness with orthostatic hypotension.  She is no driving, increase fluids -could be increase of severity of AS causing this episode,  she did just have BM so could have been vasovagal alone.  Check Echo.   Discussed with Dr. Curt Bears and decreased toprol to 12.5 mg daily and decreased dilt to 180 mg daily.    3.  Chest pain.  This amy be from AS, so will check echo first, if severe may need cardiac cath.  But if AS is stable will do lexiscan myoview   4.  Dementia followed by neurology - she is here alone for this appt.    5.  PAF maintaining SR on Eliquis continue for now.  6.  HLD per PCP.   7.  Diabetes per PCP   Current medicines are reviewed with the patient today.  The patient Has no concerns regarding medicines.  The following changes have been made:  See above Labs/ tests ordered today include:see above  Disposition:   FU:  see above  Signed, Cecilie Kicks, NP  04/22/2017 5:49 PM    [address], [place], [place], [place] Phone: [phone]; Fax: [phone]

## 2017-04-27 ENCOUNTER — Other Ambulatory Visit: Payer: Self-pay

## 2017-04-27 ENCOUNTER — Telehealth: Payer: Self-pay | Admitting: Cardiology

## 2017-04-27 ENCOUNTER — Ambulatory Visit (HOSPITAL_COMMUNITY): Payer: Medicare Other | Attending: Cardiology

## 2017-04-27 DIAGNOSIS — I272 Pulmonary hypertension, unspecified: Secondary | ICD-10-CM | POA: Diagnosis not present

## 2017-04-27 DIAGNOSIS — I110 Hypertensive heart disease with heart failure: Secondary | ICD-10-CM | POA: Diagnosis not present

## 2017-04-27 DIAGNOSIS — J449 Chronic obstructive pulmonary disease, unspecified: Secondary | ICD-10-CM | POA: Insufficient documentation

## 2017-04-27 DIAGNOSIS — I4891 Unspecified atrial fibrillation: Secondary | ICD-10-CM | POA: Insufficient documentation

## 2017-04-27 DIAGNOSIS — I083 Combined rheumatic disorders of mitral, aortic and tricuspid valves: Secondary | ICD-10-CM | POA: Diagnosis not present

## 2017-04-27 DIAGNOSIS — I509 Heart failure, unspecified: Secondary | ICD-10-CM | POA: Insufficient documentation

## 2017-04-27 DIAGNOSIS — I350 Nonrheumatic aortic (valve) stenosis: Secondary | ICD-10-CM | POA: Diagnosis not present

## 2017-04-27 DIAGNOSIS — E119 Type 2 diabetes mellitus without complications: Secondary | ICD-10-CM | POA: Insufficient documentation

## 2017-04-27 DIAGNOSIS — E785 Hyperlipidemia, unspecified: Secondary | ICD-10-CM | POA: Insufficient documentation

## 2017-04-27 MED ORDER — ISOSORBIDE MONONITRATE ER 60 MG PO TB24: 60.0000 mg | ORAL_TABLET | Freq: Every day | ORAL | 3 refills | Status: AC

## 2017-04-27 NOTE — Telephone Encounter (Signed)
New message     Delcie Roch from [facility] is asking about pt medication.   Pt c/o medication issue:  1. Name of Medication: Imdur 60 mg in AM  2. How are you currently taking this medication (dosage and times per day)? 30 mg BID  3. Are you having a reaction (difficulty breathing--STAT)? No.  4. What is your medication issue? Does pt need to continue taking medication as 30 mg BID or 60 mg in the morning. If it is 60 mg in the morning, please fax a script to [facility]

## 2017-04-27 NOTE — Telephone Encounter (Signed)
Patient Instructions  Addendum  Encounter Date:  04/22/2017            Medication Instructions:  1. DECREASE DILTIAZEM 180 MG BY MOUTH ONCE DAILY 2. DECREASE METOPROLOL SUCCINATE 25 MG TAKE 12.5 MG (1/2 TABLET) BY MOUTH ONCE DAILY 3. IMDUR TAKE 60 MG BY MOUTH IN THE MORNING     RX written, signed and faxed to # requested.

## 2017-05-08 ENCOUNTER — Encounter: Payer: Self-pay | Admitting: *Deleted

## 2017-05-18 ENCOUNTER — Ambulatory Visit: Payer: Medicare Other | Admitting: Cardiology

## 2017-05-18 ENCOUNTER — Encounter: Payer: Self-pay | Admitting: Cardiology

## 2017-05-18 VITALS — BP 110/78 | HR 70 | Ht 60.0 in | Wt 218.8 lb

## 2017-05-18 DIAGNOSIS — I481 Persistent atrial fibrillation: Secondary | ICD-10-CM | POA: Diagnosis not present

## 2017-05-18 DIAGNOSIS — I350 Nonrheumatic aortic (valve) stenosis: Secondary | ICD-10-CM

## 2017-05-18 DIAGNOSIS — I4819 Other persistent atrial fibrillation: Secondary | ICD-10-CM

## 2017-05-18 DIAGNOSIS — E782 Mixed hyperlipidemia: Secondary | ICD-10-CM

## 2017-05-18 NOTE — Progress Notes (Signed)
[facility] [address], [address] [place], [place]  [address] Phone: [phone] Fax:  [phone]  Date:  05/18/2017   ID:  Kara Ortega, DOB 1929-04-15, MRN [id number]  PCP:  Carol Ada, MD   History of Present Illness: Kara Ortega is a 84 y.o. female here for follow-up of aortic stenosis.    Previously hospitalized in 2015 with atrial fibrillation, acute kidney injury and diastolic heart failure.    She had near syncope, fall and was bradycardic and hypotensive. No acute fractures.   We changed her over from Coumadin to Eliquis. Ease of use. Her son was present for discussion. Discussed bleeding risks. Continue to encourage conditioning. Aortic stenosis is currently moderate. We will continue to monitor.  She has had no further falls. She uses a walker for ambulation. No bleeding episodes.  Occasionally will feel a sharp fleeting chest discomfort in her mid chest. Lasts a few seconds. NTG takes and goes away.  Not pursuing stress testing with her atypical symptoms.   Had some mild rectal bleeding which had stopped. Now has diarrhea. Diarrhea is her main complaint.   Wt Readings from Last 3 Encounters:  05/18/17 218 lb 12.8 oz (99.2 kg)  04/22/17 217 lb 12.8 oz (98.8 kg)  05/09/16 218 lb 7 oz (99.1 kg)     Past Medical History:  Diagnosis Date  . A-fib ([place])   . Abnormal heart rhythms   . Alzheimer disease   . Anginal pain ([place])   . Back pain   . CHF (congestive heart failure) ([place])   . Chronic kidney disease    STAGE 3   . COPD (chronic obstructive pulmonary disease) ([place])   . Depression   . Diabetes ([place])    insulin dependent  . Hypertension   . Leg pain   . Memory loss   . Neuropathy   . Shortness of breath   . Sleep apnea    uses bipap X12 years.  . Stroke ([place])    LEFT SIDE RESIDUAL    Past Surgical History:  Procedure Laterality Date  . BREAST LUMPECTOMY    . BREAST SURGERY     X4  . COLON SURGERY     removed 14 inches of colon  .  HERNIA REPAIR      Current Outpatient Medications  Medication Sig Dispense Refill  . acetaminophen (TYLENOL) 325 MG tablet Take 650 mg by mouth every 6 (six) hours as needed.    . ALPRAZolam (XANAX) 0.25 MG tablet Take 0.25 mg by mouth at bedtime.     Marland Kitchen apixaban (ELIQUIS) 5 MG TABS tablet Take 1 tablet (5 mg total) by mouth 2 (two) times daily. 180 tablet 3  . Ascorbic Acid (VITAMIN C) 1000 MG tablet Take 1,000 mg by mouth daily.    Marland Kitchen atorvastatin (LIPITOR) 10 MG tablet Take 10 mg by mouth at bedtime.     Marland Kitchen b complex vitamins tablet Take 1 tablet by mouth daily.    . B-D UF III MINI PEN NEEDLES 31G X 5 MM MISC U UTD TO INJECT INSULIN  5  . Cholecalciferol (HM VITAMIN D3) 4000 UNITS CAPS Take 4,000 Units by mouth daily.     . citalopram (CELEXA) 20 MG tablet Take 20 mg by mouth daily.    Marland Kitchen COENZYME Q-10 PO Take 1 tablet by mouth.    . diclofenac sodium (VOLTAREN) 1 % GEL Apply 4 g topically 4 (four) times daily as needed (For pain.).     Marland Kitchen  diltiazem (DILACOR XR) 180 MG 24 hr capsule Take 1 capsule (180 mg total) by mouth daily. 90 capsule 3  . febuxostat (ULORIC) 40 MG tablet Take 40 mg by mouth daily.    . fexofenadine (ALLEGRA) 180 MG tablet Take 180 mg by mouth daily.    . furosemide (LASIX) 40 MG tablet Take 40 mg by mouth daily.     . hydrocortisone (ANUSOL-HC) 25 MG suppository Place 25 mg rectally 2 (two) times daily.    . insulin lispro (HUMALOG) 100 UNIT/ML injection Inject into the skin 3 (three) times daily before meals. Injects 16 units    . isosorbide mononitrate (IMDUR) 60 MG 24 hr tablet Take 1 tablet (60 mg total) by mouth daily. 90 tablet 3  . Liraglutide (VICTOZA) 18 MG/3ML SOPN Inject 1.2 mg into the skin daily.     Marland Kitchen loperamide (IMODIUM A-D) 2 MG tablet Take 2 mg by mouth 4 (four) times daily as needed for diarrhea or loose stools.    Marland Kitchen losartan (COZAAR) 25 MG tablet Take 25 mg by mouth daily.    . methocarbamol (ROBAXIN) 500 MG tablet Take 500 mg by mouth 4 (four) times  daily.    . metoprolol succinate (TOPROL XL) 25 MG 24 hr tablet Take 0.5 tablets (12.5 mg total) by mouth daily. 45 tablet 3  . Multiple Vitamins-Minerals (MULTIVITAMIN WITH MINERALS) tablet Take 1 tablet by mouth daily.    . nitroGLYCERIN (NITROSTAT) 0.4 MG SL tablet Place 1 tablet (0.4 mg total) under the tongue every 5 (five) minutes as needed for chest pain. 25 tablet prn  . pregabalin (LYRICA) 100 MG capsule Take 1 capsule (100 mg total) by mouth 2 (two) times daily. 60 capsule 0  . Rivastigmine 13.3 MG/24HR PT24 PLACE 1 PATCH ON SKIN AND CHANGE EVERY 24 HOURS 30 patch 0  . spironolactone (ALDACTONE) 25 MG tablet Take 25 mg by mouth 2 (two) times daily.     Nelva Nay SOLOSTAR 300 UNIT/ML SOPN Inject 80 mg into the skin at bedtime.   5  . traMADol (ULTRAM) 50 MG tablet Take 50 mg by mouth every 6 (six) hours as needed.    . vitamin B-12 (CYANOCOBALAMIN) 1000 MCG tablet Take 1,000 mcg by mouth daily.    . Rivastigmine 13.3 MG/24HR PT24 Place 1 patch (13.3 mg total) onto the skin daily. (Patient not taking: Reported on 05/18/2017) 30 patch 0  . Rivastigmine 13.3 MG/24HR PT24 PLACE 1 PATCH ON SKIN AND CHANGE EVERY 24 HOURS (Patient not taking: Reported on 05/18/2017) 30 patch 5   No current facility-administered medications for this visit.     Allergies:    Allergies  Allergen Reactions  . Avandia [Rosiglitazone] Other (See Comments)    edema  . Benazepril Other (See Comments)    unknown  . Dilaudid [Hydromorphone Hcl] Other (See Comments)    "does not tolerate strong pain medications" per pt  . Fosamax [Alendronate Sodium] Nausea And Vomiting  . Talwin [Pentazocine] Nausea And Vomiting  . Tetanus Toxoids Other (See Comments)    "allergic to something in the tetanus injection" per patient  . Zyrtec [Cetirizine] Other (See Comments)    unknown    Social History:  The patient  reports that she quit smoking about 49 years ago. Her smoking use included cigarettes. She has a 27.00 pack-year  smoking history. she has never used smokeless tobacco. She reports that she does not drink alcohol or use drugs.   Family History  Problem Relation Age of  Onset  . Dementia Maternal Grandmother   . Heart disease Mother   . Heart disease Sister   . Alzheimer's disease Sister   . Dementia Maternal Aunt   . Mental illness Other     ROS:  Please see the history of present illness.    All other review of systems negative.  PHYSICAL EXAM: VS:  BP 110/78   Pulse 70   Ht 5' (1.524 m)   Wt 218 lb 12.8 oz (99.2 kg)   SpO2 93%   BMI 42.73 kg/m  GEN: Well nourished, well developed, in no acute distress  HEENT: normal  Neck: no JVD, carotid bruits, or masses Cardiac: RRR; 3/6 SM, no rubs, or gallops,no edema  Respiratory:  clear to auscultation bilaterally, normal work of breathing GI: soft, nontender, nondistended, + BS MS: no deformity or atrophy  Skin: warm and dry, no rash Neuro:  Alert and Oriented x 3, Strength and sensation are intact Psych: euthymic mood, full affect  EKG:   None today     ECHO: 04/27/17 - Hyperdynamic LVEF, moderate mitral and aortic stenosis. Mild   pulmonary hypertension.  ASSESSMENT AND PLAN:  Paroxysmal atrial fibrillation -we will continue with anticoagulation Eliquis 5 mg twice a day. She has a several year, remote history of GI bleed. This was during the [place] war. She understands the bleeding risks involved. She does utilize a walker and she is at a fall risk.  Rare rectal bleeding has resolved. Hg last 14.5  Aortic stenosis - Previously moderate to severe in December 2015. - ECHO stable. No changes. Moderate.   Hyperlipidemia -continue with atorvastatin 10 mg.  - When she is off of her fibrate, her triglycerides are 555. She is back on.  - OK to stop Co Q10, having diarrhea. She thinks this will help.   Morbid Obesity -encourage weight loss. Decrease carbs, exercise as best as possible.   Diabetes -per Dr. Tamala Julian.  Chronic  anticoagulation -Eliquis. - Watching basic metabolic profile and CBC  Prior syncope  -fall in the shower after she took nitroglycerin. She occasionally will have sharp fleeting chest pain that sometimes wakes her up from sleep. I instructed her that this is likely musculoskeletal and noncardiac and she should use her nitroglycerin sparingly. aortic stenosis - moderate , we will continue to monitor. No longer on propanolol  See back in 6 months with Mickel Baas, 12 with me  Signed, Candee Furbish, MD [facility]  05/18/2017 10:08 AM

## 2017-05-18 NOTE — Patient Instructions (Signed)
Medication Instructions:  Your physician has recommended you make the following change in your medication:  1) Stop Co-Q 10    Labwork: None ordered   Testing/Procedures: None ordered   Follow-Up: Your physician wants you to follow-up in: 6 months with Cecilie Kicks, PA and 12 months with Dr. Marlou Porch.  Please call our office to schedule the follow-up appointment.   Any Other Special Instructions Will Be Listed Below (If Applicable).     If you need a refill on your cardiac medications before your next appointment, please call your pharmacy.

## 2017-06-03 DIAGNOSIS — Z85828 Personal history of other malignant neoplasm of skin: Secondary | ICD-10-CM | POA: Diagnosis not present

## 2017-06-03 DIAGNOSIS — L821 Other seborrheic keratosis: Secondary | ICD-10-CM | POA: Diagnosis not present

## 2017-06-03 DIAGNOSIS — L853 Xerosis cutis: Secondary | ICD-10-CM | POA: Diagnosis not present

## 2017-06-09 DIAGNOSIS — R197 Diarrhea, unspecified: Secondary | ICD-10-CM | POA: Diagnosis not present

## 2017-06-15 DIAGNOSIS — E118 Type 2 diabetes mellitus with unspecified complications: Secondary | ICD-10-CM | POA: Diagnosis not present

## 2017-06-15 DIAGNOSIS — B351 Tinea unguium: Secondary | ICD-10-CM | POA: Diagnosis not present

## 2017-06-15 DIAGNOSIS — L603 Nail dystrophy: Secondary | ICD-10-CM | POA: Diagnosis not present

## 2017-06-15 DIAGNOSIS — Q845 Enlarged and hypertrophic nails: Secondary | ICD-10-CM | POA: Diagnosis not present

## 2017-07-05 DIAGNOSIS — E1142 Type 2 diabetes mellitus with diabetic polyneuropathy: Secondary | ICD-10-CM | POA: Diagnosis not present

## 2017-07-16 ENCOUNTER — Ambulatory Visit: Payer: Medicare Other | Admitting: Podiatry

## 2017-07-21 DIAGNOSIS — Z794 Long term (current) use of insulin: Secondary | ICD-10-CM | POA: Diagnosis not present

## 2017-07-21 DIAGNOSIS — N183 Chronic kidney disease, stage 3 (moderate): Secondary | ICD-10-CM | POA: Diagnosis not present

## 2017-07-21 DIAGNOSIS — Z5181 Encounter for therapeutic drug level monitoring: Secondary | ICD-10-CM | POA: Diagnosis not present

## 2017-07-21 DIAGNOSIS — E1142 Type 2 diabetes mellitus with diabetic polyneuropathy: Secondary | ICD-10-CM | POA: Diagnosis not present

## 2017-08-27 DIAGNOSIS — E1142 Type 2 diabetes mellitus with diabetic polyneuropathy: Secondary | ICD-10-CM | POA: Diagnosis not present

## 2017-09-28 DIAGNOSIS — M109 Gout, unspecified: Secondary | ICD-10-CM | POA: Diagnosis not present

## 2017-09-28 DIAGNOSIS — M5416 Radiculopathy, lumbar region: Secondary | ICD-10-CM | POA: Diagnosis not present

## 2017-10-14 DIAGNOSIS — E1142 Type 2 diabetes mellitus with diabetic polyneuropathy: Secondary | ICD-10-CM | POA: Diagnosis not present

## 2017-10-22 ENCOUNTER — Ambulatory Visit
Admission: RE | Admit: 2017-10-22 | Discharge: 2017-10-22 | Disposition: A | Discharge status: Home / Self Care | Payer: Medicare Other | Source: Ambulatory Visit | Attending: Internal Medicine | Admitting: Internal Medicine

## 2017-10-22 ENCOUNTER — Other Ambulatory Visit: Payer: Self-pay | Admitting: Internal Medicine

## 2017-10-22 DIAGNOSIS — M25511 Pain in right shoulder: Secondary | ICD-10-CM

## 2017-10-22 DIAGNOSIS — Z79899 Other long term (current) drug therapy: Secondary | ICD-10-CM | POA: Diagnosis not present

## 2017-10-22 DIAGNOSIS — E1142 Type 2 diabetes mellitus with diabetic polyneuropathy: Secondary | ICD-10-CM | POA: Diagnosis not present

## 2017-10-22 DIAGNOSIS — N183 Chronic kidney disease, stage 3 (moderate): Secondary | ICD-10-CM | POA: Diagnosis not present

## 2017-10-22 DIAGNOSIS — Z794 Long term (current) use of insulin: Secondary | ICD-10-CM | POA: Diagnosis not present

## 2017-10-22 DIAGNOSIS — S4991XA Unspecified injury of right shoulder and upper arm, initial encounter: Secondary | ICD-10-CM | POA: Diagnosis not present

## 2017-11-09 ENCOUNTER — Encounter: Payer: Self-pay | Admitting: Podiatry

## 2017-11-09 ENCOUNTER — Ambulatory Visit (INDEPENDENT_AMBULATORY_CARE_PROVIDER_SITE_OTHER): Payer: Medicare Other | Admitting: Podiatry

## 2017-11-09 ENCOUNTER — Ambulatory Visit: Payer: Medicare Other | Admitting: Orthotics

## 2017-11-09 DIAGNOSIS — I872 Venous insufficiency (chronic) (peripheral): Secondary | ICD-10-CM

## 2017-11-09 DIAGNOSIS — M19079 Primary osteoarthritis, unspecified ankle and foot: Secondary | ICD-10-CM

## 2017-11-09 DIAGNOSIS — M2042 Other hammer toe(s) (acquired), left foot: Secondary | ICD-10-CM

## 2017-11-09 DIAGNOSIS — M2140 Flat foot [pes planus] (acquired), unspecified foot: Secondary | ICD-10-CM

## 2017-11-09 DIAGNOSIS — B351 Tinea unguium: Secondary | ICD-10-CM

## 2017-11-09 DIAGNOSIS — E1149 Type 2 diabetes mellitus with other diabetic neurological complication: Secondary | ICD-10-CM

## 2017-11-09 DIAGNOSIS — M79676 Pain in unspecified toe(s): Secondary | ICD-10-CM

## 2017-11-09 DIAGNOSIS — M2041 Other hammer toe(s) (acquired), right foot: Secondary | ICD-10-CM

## 2017-11-09 NOTE — Patient Instructions (Signed)
[facility] Neuromuscular Therapy Tharon Aquas, LMBT [phone]  [place]

## 2017-11-09 NOTE — Progress Notes (Signed)
Patient presents today for diabetic shoe measurement and foam casting.  Goals of diabetic shoes/inserts to offer protection from conditions secondary to DM2, offer relief from sheer forces that could lead to ulcerations, protect the foot, and offer greater stability. Patient is under supervision of DPM [place] Physician managing patients DM2: Buddy Duty Patient has following documented conditions to qualify for diabetic shoes/inserts: DM2, pes planus, PAD, HT Patient measured with brannock device:

## 2017-11-11 DIAGNOSIS — L304 Erythema intertrigo: Secondary | ICD-10-CM | POA: Diagnosis not present

## 2017-11-11 DIAGNOSIS — D692 Other nonthrombocytopenic purpura: Secondary | ICD-10-CM | POA: Diagnosis not present

## 2017-11-11 DIAGNOSIS — L918 Other hypertrophic disorders of the skin: Secondary | ICD-10-CM | POA: Diagnosis not present

## 2017-11-11 DIAGNOSIS — Z85828 Personal history of other malignant neoplasm of skin: Secondary | ICD-10-CM | POA: Diagnosis not present

## 2017-11-11 NOTE — Progress Notes (Signed)
Subjective: 84 y.o. returns the office today for painful, elongated, thickened toenails which she cannot trim herself. Denies any redness or drainage around the nails.  She also presents to get measured for diabetic shoes with [name].  She is also asking if there is a place that she can go get a massage of her feet.  She occasionally gets some discomfort to her feet which is been a chronic issue for several years but denies any recent injury or trauma.  Denies any acute changes since last appointment and no new complaints today. Denies any systemic complaints such as fevers, chills, nausea, vomiting.   PCP: Carol Ada, MD  Objective: AAO 3, NAD DP/PT pulses palpable, CRT less than 3 seconds Protective sensation decreased  with Simms Weinstein monofilament Nails hypertrophic, dystrophic, elongated, brittle, discolored 10. There is tenderness overlying the nails 1-5 bilaterally. There is no surrounding erythema or drainage along the nail sites. No open lesions or pre-ulcerative lesions are identified. No other areas of tenderness bilateral lower extremities. No overlying edema, erythema, increased warmth. No pain with calf compression, swelling, warmth, erythema.  Assessment: Patient presents with symptomatic onychomycosis  Plan: -Treatment options including alternatives, risks, complications were discussed -Nails sharply debrided 10 without complication/bleeding. -Information was given out [place] neuromuscular therapy with Tharon Aquas, LMBT -Discussed daily foot inspection. If there are any changes, to call the office immediately.  -Follow-up in 3 months or sooner if any problems are to arise. In the meantime, encouraged to call the office with any questions, concerns, changes symptoms.  Celesta Gentile, DPM

## 2017-11-12 DIAGNOSIS — G4489 Other headache syndrome: Secondary | ICD-10-CM | POA: Diagnosis not present

## 2017-11-12 DIAGNOSIS — R5383 Other fatigue: Secondary | ICD-10-CM | POA: Diagnosis not present

## 2017-11-12 DIAGNOSIS — N3000 Acute cystitis without hematuria: Secondary | ICD-10-CM | POA: Diagnosis not present

## 2017-12-01 DIAGNOSIS — E1142 Type 2 diabetes mellitus with diabetic polyneuropathy: Secondary | ICD-10-CM | POA: Diagnosis not present

## 2017-12-14 ENCOUNTER — Ambulatory Visit (INDEPENDENT_AMBULATORY_CARE_PROVIDER_SITE_OTHER): Payer: Medicare Other | Admitting: Orthotics

## 2017-12-14 DIAGNOSIS — M2042 Other hammer toe(s) (acquired), left foot: Secondary | ICD-10-CM

## 2017-12-14 DIAGNOSIS — M19079 Primary osteoarthritis, unspecified ankle and foot: Secondary | ICD-10-CM | POA: Diagnosis not present

## 2017-12-14 DIAGNOSIS — I872 Venous insufficiency (chronic) (peripheral): Secondary | ICD-10-CM

## 2017-12-14 DIAGNOSIS — M2041 Other hammer toe(s) (acquired), right foot: Secondary | ICD-10-CM | POA: Diagnosis not present

## 2017-12-14 DIAGNOSIS — M2140 Flat foot [pes planus] (acquired), unspecified foot: Secondary | ICD-10-CM

## 2017-12-14 DIAGNOSIS — E1149 Type 2 diabetes mellitus with other diabetic neurological complication: Secondary | ICD-10-CM | POA: Diagnosis not present

## 2017-12-14 NOTE — Progress Notes (Signed)

## 2017-12-31 DIAGNOSIS — H0012 Chalazion right lower eyelid: Secondary | ICD-10-CM | POA: Diagnosis not present

## 2018-01-21 DIAGNOSIS — E1142 Type 2 diabetes mellitus with diabetic polyneuropathy: Secondary | ICD-10-CM | POA: Diagnosis not present

## 2018-01-26 DIAGNOSIS — I350 Nonrheumatic aortic (valve) stenosis: Secondary | ICD-10-CM | POA: Diagnosis not present

## 2018-01-26 DIAGNOSIS — E782 Mixed hyperlipidemia: Secondary | ICD-10-CM | POA: Diagnosis not present

## 2018-01-26 DIAGNOSIS — Z7901 Long term (current) use of anticoagulants: Secondary | ICD-10-CM | POA: Diagnosis not present

## 2018-01-26 DIAGNOSIS — I5032 Chronic diastolic (congestive) heart failure: Secondary | ICD-10-CM | POA: Diagnosis not present

## 2018-01-28 DIAGNOSIS — H52223 Regular astigmatism, bilateral: Secondary | ICD-10-CM | POA: Diagnosis not present

## 2018-02-08 ENCOUNTER — Ambulatory Visit: Payer: Medicare Other | Admitting: Podiatry

## 2018-03-08 DIAGNOSIS — Z0000 Encounter for general adult medical examination without abnormal findings: Secondary | ICD-10-CM | POA: Diagnosis not present

## 2018-03-08 DIAGNOSIS — I5032 Chronic diastolic (congestive) heart failure: Secondary | ICD-10-CM | POA: Diagnosis not present

## 2018-03-08 DIAGNOSIS — E782 Mixed hyperlipidemia: Secondary | ICD-10-CM | POA: Diagnosis not present

## 2018-03-08 DIAGNOSIS — Z1389 Encounter for screening for other disorder: Secondary | ICD-10-CM | POA: Diagnosis not present

## 2018-03-11 DIAGNOSIS — E1142 Type 2 diabetes mellitus with diabetic polyneuropathy: Secondary | ICD-10-CM | POA: Diagnosis not present

## 2018-03-23 DIAGNOSIS — Z6841 Body Mass Index (BMI) 40.0 and over, adult: Secondary | ICD-10-CM | POA: Diagnosis not present

## 2018-03-23 DIAGNOSIS — M25562 Pain in left knee: Secondary | ICD-10-CM | POA: Diagnosis not present

## 2018-03-23 DIAGNOSIS — M25561 Pain in right knee: Secondary | ICD-10-CM | POA: Diagnosis not present

## 2018-03-25 DIAGNOSIS — Z794 Long term (current) use of insulin: Secondary | ICD-10-CM | POA: Diagnosis not present

## 2018-03-25 DIAGNOSIS — N183 Chronic kidney disease, stage 3 (moderate): Secondary | ICD-10-CM | POA: Diagnosis not present

## 2018-03-25 DIAGNOSIS — E1142 Type 2 diabetes mellitus with diabetic polyneuropathy: Secondary | ICD-10-CM | POA: Diagnosis not present

## 2018-03-25 DIAGNOSIS — Z5181 Encounter for therapeutic drug level monitoring: Secondary | ICD-10-CM | POA: Diagnosis not present

## 2018-03-29 DIAGNOSIS — D72829 Elevated white blood cell count, unspecified: Secondary | ICD-10-CM | POA: Diagnosis not present

## 2018-03-29 DIAGNOSIS — I9589 Other hypotension: Secondary | ICD-10-CM | POA: Diagnosis not present

## 2018-04-07 ENCOUNTER — Telehealth: Payer: Self-pay | Admitting: Hematology

## 2018-04-07 NOTE — Telephone Encounter (Signed)
Received a new hem referral from Dr. Carol Ada for elevated white blood cell count. Cld and spoke to the pt's son, [name], to schedule an appt. Appt has been made for the pt to see Dr. Irene Limbo on 1/21 at 1pm. Aware to arrive 30 minutes early. Address and location provided.

## 2018-04-08 DIAGNOSIS — K644 Residual hemorrhoidal skin tags: Secondary | ICD-10-CM | POA: Diagnosis not present

## 2018-04-08 DIAGNOSIS — R1031 Right lower quadrant pain: Secondary | ICD-10-CM | POA: Diagnosis not present

## 2018-04-08 DIAGNOSIS — R319 Hematuria, unspecified: Secondary | ICD-10-CM | POA: Diagnosis not present

## 2018-04-09 DIAGNOSIS — K649 Unspecified hemorrhoids: Secondary | ICD-10-CM | POA: Diagnosis not present

## 2018-04-09 DIAGNOSIS — I959 Hypotension, unspecified: Secondary | ICD-10-CM | POA: Diagnosis not present

## 2018-04-09 DIAGNOSIS — K625 Hemorrhage of anus and rectum: Secondary | ICD-10-CM | POA: Diagnosis not present

## 2018-04-09 DIAGNOSIS — R109 Unspecified abdominal pain: Secondary | ICD-10-CM | POA: Diagnosis not present

## 2018-04-19 NOTE — Progress Notes (Signed)
HEMATOLOGY/ONCOLOGY CONSULTATION NOTE  Date of Service: 04/20/2018  Patient Care Team: Carol Ada, MD as PCP - General (Family Medicine)  CHIEF COMPLAINTS/PURPOSE OF CONSULTATION:  Neutrophilia  HISTORY OF PRESENTING ILLNESS:  Kara Ortega is a wonderful 85 y.o. female who has been referred to [place] by Dr. Carol Ada for evaluation and management of neutrophilia. She is accompanied today by her son, [name]. The pt reports that she is doing well overall.   The pt's son notes that the patient's WBC have been elevated for the last few months, and notes that her WBC were about 12k prior to her most recent 03/29/18 labs, as noted below. The pt notes that she has had less energy in the last six months, and notes that she also has a weak appetite and is not eating as well as she used to. She notes that she has lost about 10 pounds in the last few months as well. The pt notes that she is not sleeping well, which is her baseline, and notes that "happy thoughts," keep her up. The pt notes that her Bipap was last optimized "years ago," and does have sleep apnea. The pt was stopped on a blood pressure medication after having dizziness and low blood pressures.  The pt notes that she had "the worst cold she's ever had," sometime around November, which lasted several weeks. She did not receive antibiotics, nor steroids, and did not pursue medical evaluation. She notes that she has been fine for the last 3-4 weeks. She denies sore throat, discomfort passing urine, vaginal discharge or itching, or ear pain. The pt also denies new back pains or abdominal pains. The pt received a cortisone injection "around Christmas," for her right knee pain.  The pt has a history of bleeding hemorrhoids and notes that she has had some concerns for some bleeding recently. She also notes that she had five fibrocysts removed from her breasts over the years.   Most recent lab results (03/29/18) of CBC w/diff and CMP is as  follows: all values are WNL except for WBC at 15.1k, RDW at 18.0, MPV at 11.6, ANC at 10.6k, Lymphs abs at 3.30k, Monocytes abs at 900, Glucose at 181, BUN at 37, Creatinine at 1.40, GFR at 36.  On review of systems, pt reports right knee pain, tiredness, weaker appetite, not sleeping well, recent infection, moving her bowels well, dry skin, and denies abdominal pains, back pains, fevers, chills, night sweats, mouth sores, ear pain, vaginal discharge, vaginal bleeding, painful urination sore throat, vaginal itching, skin rashes, skin lesions, and any other symptoms.  On PMHx the pt reports dementia. 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  MEDICAL HISTORY:  Past Medical History:  Diagnosis Date  . A-fib ([place])   . Abnormal heart rhythms   . Alzheimer disease   . Anginal pain ([place])   . Back pain   . CHF (congestive heart failure) ([place])   . Chronic kidney disease    STAGE 3   . COPD (chronic obstructive pulmonary disease) ([place])   . Depression   . Diabetes ([place])    insulin dependent  . Hypertension   . Leg pain   . Memory loss   . Neuropathy   . Shortness of breath   . Sleep apnea    uses bipap X12 years.  . Stroke ([place])  LEFT SIDE RESIDUAL    SURGICAL HISTORY: Past Surgical History:  Procedure Laterality Date  . BREAST LUMPECTOMY    . BREAST SURGERY     X4  . COLON SURGERY     removed 14 inches of colon  . HERNIA REPAIR      SOCIAL HISTORY: Social History   Socioeconomic History  . Marital status: Single    Spouse name: Not on file  . Number of children: 3  . Years of education: Not on file  . Highest education level: Not on file  Occupational History  . Occupation: retired  Scientific laboratory technician  . Financial resource strain: Not on file  . Food insecurity:    Worry: Not on file    Inability: Not on file  . Transportation needs:    Medical: Not  on file    Non-medical: Not on file  Tobacco Use  . Smoking status: Former Smoker    Packs/day: 1.50    Years: 18.00    Pack years: 27.00    Types: Cigarettes    Last attempt to quit: 03/31/1968    Years since quitting: 50.0  . Smokeless tobacco: Never Used  Substance and Sexual Activity  . Alcohol use: No    Alcohol/week: 0.0 standard drinks  . Drug use: No  . Sexual activity: Never  Lifestyle  . Physical activity:    Days per week: Not on file    Minutes per session: Not on file  . Stress: Not on file  Relationships  . Social connections:    Talks on phone: Not on file    Gets together: Not on file    Attends religious service: Not on file    Active member of club or organization: Not on file    Attends meetings of clubs or organizations: Not on file    Relationship status: Not on file  . Intimate partner violence:    Fear of current or ex partner: Not on file    Emotionally abused: Not on file    Physically abused: Not on file    Forced sexual activity: Not on file  Other Topics Concern  . Not on file  Social History Narrative  . Not on file    FAMILY HISTORY: Family History  Problem Relation Age of Onset  . Dementia Maternal Grandmother   . Heart disease Mother   . Heart disease Sister   . Alzheimer's disease Sister   . Dementia Maternal Aunt   . Mental illness Other     ALLERGIES:  is allergic to avandia [rosiglitazone]; benazepril; dilaudid [hydromorphone hcl]; fosamax [alendronate sodium]; talwin [pentazocine]; tetanus toxoids; and zyrtec [cetirizine].  MEDICATIONS:  Current Outpatient Medications  Medication Sig Dispense Refill  . acetaminophen (TYLENOL) 325 MG tablet Take 650 mg by mouth every 6 (six) hours as needed.    . ALPRAZolam (XANAX) 0.25 MG tablet Take 0.25 mg by mouth at bedtime.     Marland Kitchen apixaban (ELIQUIS) 5 MG TABS tablet Take 1 tablet (5 mg total) by mouth 2 (two) times daily. 180 tablet 3  . Ascorbic Acid (VITAMIN C) 1000 MG tablet Take  1,000 mg by mouth daily.    Marland Kitchen atorvastatin (LIPITOR) 10 MG tablet Take 10 mg by mouth at bedtime.     Marland Kitchen b complex vitamins tablet Take 1 tablet by mouth daily.    . B-D UF III MINI PEN NEEDLES 31G X 5 MM MISC U UTD TO INJECT INSULIN  5  . Cholecalciferol (HM VITAMIN  D3) 4000 UNITS CAPS Take 4,000 Units by mouth daily.     . citalopram (CELEXA) 20 MG tablet Take 20 mg by mouth daily.    Marland Kitchen COENZYME Q-10 PO Take 1 tablet by mouth.    . diclofenac sodium (VOLTAREN) 1 % GEL Apply 4 g topically 4 (four) times daily as needed (For pain.).     Marland Kitchen diltiazem (DILACOR XR) 180 MG 24 hr capsule Take 1 capsule (180 mg total) by mouth daily. 90 capsule 3  . febuxostat (ULORIC) 40 MG tablet Take 40 mg by mouth daily.    . fexofenadine (ALLEGRA) 180 MG tablet Take 180 mg by mouth daily.    . furosemide (LASIX) 40 MG tablet Take 40 mg by mouth daily.     . hydrocortisone (ANUSOL-HC) 25 MG suppository Place 25 mg rectally 2 (two) times daily.    . insulin lispro (HUMALOG) 100 UNIT/ML injection Inject into the skin 3 (three) times daily before meals. Injects 16 units    . isosorbide mononitrate (IMDUR) 60 MG 24 hr tablet Take 1 tablet (60 mg total) by mouth daily. 90 tablet 3  . Liraglutide (VICTOZA) 18 MG/3ML SOPN Inject 1.2 mg into the skin daily.     Marland Kitchen loperamide (IMODIUM A-D) 2 MG tablet Take 2 mg by mouth 4 (four) times daily as needed for diarrhea or loose stools.    Marland Kitchen losartan (COZAAR) 25 MG tablet Take 25 mg by mouth daily.    . methocarbamol (ROBAXIN) 500 MG tablet Take 500 mg by mouth 4 (four) times daily.    . metoprolol succinate (TOPROL XL) 25 MG 24 hr tablet Take 0.5 tablets (12.5 mg total) by mouth daily. 45 tablet 3  . Multiple Vitamins-Minerals (MULTIVITAMIN WITH MINERALS) tablet Take 1 tablet by mouth daily.    . nitroGLYCERIN (NITROSTAT) 0.4 MG SL tablet Place 1 tablet (0.4 mg total) under the tongue every 5 (five) minutes as needed for chest pain. 25 tablet prn  . pregabalin (LYRICA) 100 MG  capsule Take 1 capsule (100 mg total) by mouth 2 (two) times daily. 60 capsule 0  . Rivastigmine 13.3 MG/24HR PT24 Place 1 patch (13.3 mg total) onto the skin daily. (Patient not taking: Reported on 05/18/2017) 30 patch 0  . Rivastigmine 13.3 MG/24HR PT24 PLACE 1 PATCH ON SKIN AND CHANGE EVERY 24 HOURS 30 patch 0  . Rivastigmine 13.3 MG/24HR PT24 PLACE 1 PATCH ON SKIN AND CHANGE EVERY 24 HOURS (Patient not taking: Reported on 05/18/2017) 30 patch 5  . spironolactone (ALDACTONE) 25 MG tablet Take 25 mg by mouth 2 (two) times daily.     Nelva Nay SOLOSTAR 300 UNIT/ML SOPN Inject 80 mg into the skin at bedtime.   5  . traMADol (ULTRAM) 50 MG tablet Take 50 mg by mouth every 6 (six) hours as needed.    . vitamin B-12 (CYANOCOBALAMIN) 1000 MCG tablet Take 1,000 mcg by mouth daily.     No current facility-administered medications for this visit.     REVIEW OF SYSTEMS:    10 Point review of Systems was done is negative except as noted above.  PHYSICAL EXAMINATION:  . Vitals:   04/20/18 1322  BP: 108/62  Pulse: 82  Resp: 18  Temp: 97.9 F (36.6 C)  SpO2: 95%   Filed Weights   04/20/18 1322  Weight: 213 lb (96.6 kg)   .Body mass index is 41.6 kg/m.  GENERAL:alert, in no acute distress and comfortable SKIN: no acute rashes, no significant lesions EYES:  conjunctiva are pink and non-injected, sclera anicteric OROPHARYNX: MMM, no exudates, no oropharyngeal erythema or ulceration NECK: supple, no JVD LYMPH:  no palpable lymphadenopathy in the cervical, axillary or inguinal regions LUNGS: clear to auscultation b/l with normal respiratory effort HEART: regular rate & rhythm ABDOMEN:  normoactive bowel sounds , non tender, not distended. No palpable hepatosplenomegaly Extremity: no pedal edema PSYCH: alert & oriented x 3 with fluent speech NEURO: no focal motor/sensory deficits  LABORATORY DATA:  I have reviewed the data as listed  . CBC Latest Ref Rng & Units 04/20/2018 01/29/2015  03/26/2014  WBC 4.0 - 10.5 K/uL 9.5 8.3 8.0  Hemoglobin 12.0 - 15.0 g/dL 14.7 14.6 13.9  Hematocrit 36.0 - 46.0 % 45.0 43.0 43.1  Platelets 150 - 400 K/uL 211 156 168    . CMP Latest Ref Rng & Units 04/20/2018 01/29/2015 03/26/2014  Glucose 70 - 99 mg/dL 158(H) 225(H) 196(H)  BUN 8 - 23 mg/dL 24(H) 26(H) 27(H)  Creatinine 0.44 - 1.00 mg/dL 1.17(H) 0.82 0.94  Sodium 135 - 145 mmol/L 139 136 138  Potassium 3.5 - 5.1 mmol/L 4.6 5.0 3.9  Chloride 98 - 111 mmol/L 102 103 103  CO2 22 - 32 mmol/L 25 25 27   Calcium 8.9 - 10.3 mg/dL 10.1 9.6 9.9  Total Protein 6.5 - 8.1 g/dL 7.4 - -  Total Bilirubin 0.3 - 1.2 mg/dL 0.4 - -  Alkaline Phos 38 - 126 U/L 62 - -  AST 15 - 41 U/L 17 - -  ALT 0 - 44 U/L 19 - -   . Lab Results  Component Value Date   LDH 158 04/20/2018    03/29/18 CBC w/diff:    RADIOGRAPHIC STUDIES: I have personally reviewed the radiological images as listed and agreed with the findings in the report. No results found.  ASSESSMENT & PLAN:  85 y.o. female with  1. Neutrophilia - likely reactive from respiratory infection in nov 2019 and from intra-articular steroids in dec 2019. Labs today show resolution of her leucocytosis/neutrophilia. PLAN -Discussed patient's most recent labs from 03/29/18, WBC at 15.1k, HGB normal at 14.8, PLT normal at 177k. ANC at 10.6k. -Reviewed previous CBC w/diff from 01/29/15 which showed all values WNL -Discussed that as the pt did have a significant concern for infection, and a steroid injection, around the time of her last labs, I suspect her mild neutrophilia is reactive -Discussed that I am not concerned for a primary bone marrow problem as her HGB and PLT have been normal also -Will order blood tests today- labs reviewed -- there has been resolution ofher leucocytosis/neutrophilia. -no further hematologic workup indicated.  Labs today RTC with Dr Irene Limbo as needed based on labs   All of the patients questions were answered with  apparent satisfaction. The patient knows to call the clinic with any problems, questions or concerns.  The total time spent in the appt was 40 minutes and more than 50% was on counseling and direct patient cares.     Sullivan Lone MD MS AAHIVMS [facility] Hematology/Oncology Physician [facility]  (Office):       [phone] (Work cell):  [phone] (Fax):           [phone]  04/20/2018 2:04 PM  I, Baldwin [place], am acting as a scribe for Dr. Sullivan Lone.   .I have reviewed the above documentation for accuracy and completeness, and I agree with the above. Brunetta Genera MD

## 2018-04-20 ENCOUNTER — Telehealth: Payer: Self-pay | Admitting: Hematology

## 2018-04-20 ENCOUNTER — Encounter: Payer: Medicare Other | Admitting: Hematology

## 2018-04-20 ENCOUNTER — Inpatient Hospital Stay: Payer: Medicare Other | Attending: Hematology | Admitting: Hematology

## 2018-04-20 ENCOUNTER — Inpatient Hospital Stay: Payer: Medicare Other

## 2018-04-20 ENCOUNTER — Encounter: Payer: Medicare Other | Admitting: Oncology

## 2018-04-20 VITALS — BP 108/62 | HR 82 | Temp 97.9°F | Resp 18 | Ht 60.0 in | Wt 213.0 lb

## 2018-04-20 DIAGNOSIS — D72829 Elevated white blood cell count, unspecified: Secondary | ICD-10-CM

## 2018-04-20 DIAGNOSIS — I10 Essential (primary) hypertension: Secondary | ICD-10-CM | POA: Diagnosis not present

## 2018-04-20 DIAGNOSIS — E119 Type 2 diabetes mellitus without complications: Secondary | ICD-10-CM | POA: Diagnosis not present

## 2018-04-20 DIAGNOSIS — Z7901 Long term (current) use of anticoagulants: Secondary | ICD-10-CM

## 2018-04-20 DIAGNOSIS — Z794 Long term (current) use of insulin: Secondary | ICD-10-CM | POA: Insufficient documentation

## 2018-04-20 DIAGNOSIS — Z79899 Other long term (current) drug therapy: Secondary | ICD-10-CM | POA: Insufficient documentation

## 2018-04-20 LAB — CBC WITH DIFFERENTIAL/PLATELET
ABS IMMATURE GRANULOCYTES: 0.05 10*3/uL (ref 0.00–0.07)
BASOS ABS: 0 10*3/uL (ref 0.0–0.1)
Basophils Relative: 0 %
EOS ABS: 0.2 10*3/uL (ref 0.0–0.5)
EOS PCT: 2 %
HEMATOCRIT: 45 % (ref 36.0–46.0)
Hemoglobin: 14.7 g/dL (ref 12.0–15.0)
Immature Granulocytes: 1 %
LYMPHS ABS: 2.4 10*3/uL (ref 0.7–4.0)
Lymphocytes Relative: 25 %
MCH: 28.7 pg (ref 26.0–34.0)
MCHC: 32.7 g/dL (ref 30.0–36.0)
MCV: 87.9 fL (ref 80.0–100.0)
MONO ABS: 0.9 10*3/uL (ref 0.1–1.0)
MONOS PCT: 9 %
Neutro Abs: 6.1 10*3/uL (ref 1.7–7.7)
Neutrophils Relative %: 63 %
Platelets: 211 10*3/uL (ref 150–400)
RBC: 5.12 MIL/uL — AB (ref 3.87–5.11)
RDW: 16.3 % — ABNORMAL HIGH (ref 11.5–15.5)
WBC: 9.5 10*3/uL (ref 4.0–10.5)
nRBC: 0 % (ref 0.0–0.2)

## 2018-04-20 LAB — CMP (CANCER CENTER ONLY)
ALBUMIN: 4.4 g/dL (ref 3.5–5.0)
ALK PHOS: 62 U/L (ref 38–126)
ALT: 19 U/L (ref 0–44)
AST: 17 U/L (ref 15–41)
Anion gap: 12 (ref 5–15)
BUN: 24 mg/dL — AB (ref 8–23)
CALCIUM: 10.1 mg/dL (ref 8.9–10.3)
CO2: 25 mmol/L (ref 22–32)
CREATININE: 1.17 mg/dL — AB (ref 0.44–1.00)
Chloride: 102 mmol/L (ref 98–111)
GFR, Est AFR Am: 49 mL/min — ABNORMAL LOW (ref >60)
GFR, Estimated: 42 mL/min — ABNORMAL LOW (ref >60)
GLUCOSE: 158 mg/dL — AB (ref 70–99)
Potassium: 4.6 mmol/L (ref 3.5–5.1)
SODIUM: 139 mmol/L (ref 135–145)
Total Bilirubin: 0.4 mg/dL (ref 0.3–1.2)
Total Protein: 7.4 g/dL (ref 6.5–8.1)

## 2018-04-20 LAB — LACTATE DEHYDROGENASE: LDH: 158 U/L (ref 98–192)

## 2018-04-20 NOTE — Telephone Encounter (Signed)
Gave patient avs report. Patient sent back to lab. Follow up as needed based on labs per 1/21. [place].

## 2018-04-20 NOTE — Patient Instructions (Signed)
Thank you for choosing [facility] to provide your oncology and hematology care.  To afford each patient quality time with our providers, please arrive 30 minutes before your scheduled appointment time.  If you arrive late for your appointment, you may be asked to reschedule.  We strive to give you quality time with our providers, and arriving late affects you and other patients whose appointments are after yours.    If you are a no show for multiple scheduled visits, you may be dismissed from the clinic at the providers discretion.     Again, thank you for choosing [facility], our hope is that these requests will decrease the amount of time that you wait before being seen by our physicians.  ______________________________________________________________________   Should you have questions after your visit to the [facility], please contact our office at [phone] between the hours of 8:30 and 4:30 p.m.    Voicemails left after 4:30p.m will not be returned until the following business day.     For prescription refill requests, please have your pharmacy contact us directly.  Please also try to allow 48 hours for prescription requests.     Please contact the scheduling department for questions regarding scheduling.  For scheduling of procedures such as PET scans, CT scans, MRI, Ultrasound, etc please contact central scheduling at [phone].     Resources For Cancer Patients and Caregivers:    Oncolink.org:  A wonderful resource for patients and healthcare providers for information regarding your disease, ways to tract your treatment, what to expect, etc.      American Cancer Society:  800-227-2345  Can help patients locate various types of support and financial assistance   Cancer Care: 1-800-813-HOPE (4673) Provides financial assistance, online support groups, medication/co-pay assistance.     [place] DSS:  336-641-3447 Where to apply  for food stamps, Medicaid, and utility assistance   Medicare Rights Center: 800-333-4114 Helps people with Medicare understand their rights and benefits, navigate the Medicare system, and secure the quality healthcare they deserve   SCAT: 336-333-6589 [place] [employer]'s shared-ride transportation service for eligible riders who have a disability that prevents them from riding the fixed route bus.     For additional information on assistance programs please contact our social worker:   Abigail Elmore:  [phone]  

## 2018-04-21 ENCOUNTER — Telehealth: Payer: Self-pay | Admitting: *Deleted

## 2018-04-21 NOTE — Telephone Encounter (Signed)
Per DPR: Ok to leave detailed message on home phone of son [name] [phone]. Left this message on named voice mail: Per Dr. Irene Limbo, WBCs are normalized and no further hematologic follow up or work up is needed with Dr. Irene Limbo. Encouraged to contact office for questions or concerns.

## 2018-04-29 ENCOUNTER — Encounter: Payer: Self-pay | Admitting: Cardiology

## 2018-04-30 DIAGNOSIS — E1142 Type 2 diabetes mellitus with diabetic polyneuropathy: Secondary | ICD-10-CM | POA: Diagnosis not present

## 2018-05-18 ENCOUNTER — Encounter: Payer: Self-pay | Admitting: Neurology

## 2018-05-21 ENCOUNTER — Ambulatory Visit: Payer: Medicare Other | Admitting: Cardiology

## 2018-05-26 DIAGNOSIS — S7001XA Contusion of right hip, initial encounter: Secondary | ICD-10-CM | POA: Diagnosis not present

## 2018-05-26 DIAGNOSIS — R300 Dysuria: Secondary | ICD-10-CM | POA: Diagnosis not present

## 2018-05-26 DIAGNOSIS — N6310 Unspecified lump in the right breast, unspecified quadrant: Secondary | ICD-10-CM | POA: Diagnosis not present

## 2018-05-26 DIAGNOSIS — F0390 Unspecified dementia without behavioral disturbance: Secondary | ICD-10-CM | POA: Diagnosis not present

## 2018-05-27 ENCOUNTER — Ambulatory Visit
Admission: RE | Admit: 2018-05-27 | Discharge: 2018-05-27 | Disposition: A | Discharge status: Home / Self Care | Payer: Medicare Other | Source: Ambulatory Visit | Attending: Family Medicine | Admitting: Family Medicine

## 2018-05-27 ENCOUNTER — Other Ambulatory Visit: Payer: Self-pay | Admitting: Family Medicine

## 2018-05-27 DIAGNOSIS — S7001XA Contusion of right hip, initial encounter: Secondary | ICD-10-CM

## 2018-05-27 DIAGNOSIS — S79911A Unspecified injury of right hip, initial encounter: Secondary | ICD-10-CM | POA: Diagnosis not present

## 2018-05-27 DIAGNOSIS — Y92129 Unspecified place in nursing home as the place of occurrence of the external cause: Secondary | ICD-10-CM

## 2018-05-27 DIAGNOSIS — R52 Pain, unspecified: Secondary | ICD-10-CM | POA: Diagnosis not present

## 2018-05-27 DIAGNOSIS — W19XXXA Unspecified fall, initial encounter: Secondary | ICD-10-CM

## 2018-05-27 DIAGNOSIS — M25551 Pain in right hip: Secondary | ICD-10-CM | POA: Diagnosis not present

## 2018-05-27 DIAGNOSIS — S3992XA Unspecified injury of lower back, initial encounter: Secondary | ICD-10-CM | POA: Diagnosis not present

## 2018-06-07 ENCOUNTER — Encounter

## 2018-06-07 ENCOUNTER — Ambulatory Visit (INDEPENDENT_AMBULATORY_CARE_PROVIDER_SITE_OTHER): Payer: Medicare Other | Admitting: Cardiology

## 2018-06-07 ENCOUNTER — Ambulatory Visit: Payer: Medicare Other | Admitting: Neurology

## 2018-06-07 ENCOUNTER — Encounter: Payer: Self-pay | Admitting: Cardiology

## 2018-06-07 VITALS — BP 124/68 | HR 80 | Ht 60.0 in | Wt 214.2 lb

## 2018-06-07 DIAGNOSIS — I350 Nonrheumatic aortic (valve) stenosis: Secondary | ICD-10-CM

## 2018-06-07 NOTE — Progress Notes (Signed)
[facility]. [address], [address], [place]  [address] Phone: [phone] Fax:  [phone]  Date:  06/07/2018   ID:  Kara Ortega, DOB January 02, 1930, MRN [id number]  PCP:  Carol Ada, MD   History of Present Illness: Kara Ortega is a 85 y.o. female here for follow-up of aortic stenosis.    Previously hospitalized in 2015 with atrial fibrillation, acute kidney injury and diastolic heart failure.    She had near syncope, fall and was bradycardic and hypotensive. No acute fractures.   We changed her over from Coumadin to Eliquis. Ease of use. Her son was present for discussion. Discussed bleeding risks. Continue to encourage conditioning. Aortic stenosis is currently moderate. We will continue to monitor.  She has had no further falls. She uses a walker for ambulation. No bleeding episodes.  Occasionally will feel a sharp fleeting chest discomfort in her mid chest. Lasts a few seconds. NTG takes and goes away.  Not pursuing stress testing with her atypical symptoms.   Had some mild rectal bleeding which had stopped. Now has diarrhea. Diarrhea is her main complaint.   06/07/2018-here for the follow-up of aortic stenosis, atrial fibrillation. No sharp chest pain any further.  Doing quite well.  She did have discussion about advanced directives with Dr. Tamala Julian in the past.  We discussed again today, DNR status.  See below.  Overall no syncope bleeding orthopnea PND.  She states that after stopping some of her blood pressure medications, her blood pressure is markedly improved.  It was quite low previously.  She has had falls in the past.  States that she has not had any recently.  Denies any fevers chills nausea vomiting syncope.  Wt Readings from Last 3 Encounters:  06/07/18 214 lb 3.2 oz (97.2 kg)  04/20/18 213 lb (96.6 kg)  05/18/17 218 lb 12.8 oz (99.2 kg)     Past Medical History:  Diagnosis Date  . A-fib ([place])   . Abnormal heart rhythms   . Alzheimer disease ([place])     . Anginal pain ([place])   . Back pain   . CHF (congestive heart failure) ([place])   . Chronic kidney disease    STAGE 3   . COPD (chronic obstructive pulmonary disease) ([place])   . Depression   . Diabetes ([place])    insulin dependent  . Hypertension   . Leg pain   . Memory loss   . Neuropathy   . Shortness of breath   . Sleep apnea    uses bipap X12 years.  . Stroke ([place])    LEFT SIDE RESIDUAL    Past Surgical History:  Procedure Laterality Date  . BREAST LUMPECTOMY    . BREAST SURGERY     X4  . COLON SURGERY     removed 14 inches of colon  . HERNIA REPAIR      Current Outpatient Medications  Medication Sig Dispense Refill  . acetaminophen (TYLENOL) 325 MG tablet Take 650 mg by mouth every 6 (six) hours as needed.    Marland Kitchen allopurinol (ZYLOPRIM) 100 MG tablet Take 100 mg by mouth 2 (two) times daily.    Marland Kitchen ALPRAZolam (XANAX) 0.25 MG tablet Take 0.25 mg by mouth at bedtime.     Marland Kitchen apixaban (ELIQUIS) 5 MG TABS tablet Take 1 tablet (5 mg total) by mouth 2 (two) times daily. 180 tablet 3  . Ascorbic Acid (VITAMIN C) 1000 MG tablet Take 1,000 mg by mouth daily.    Marland Kitchen  atorvastatin (LIPITOR) 10 MG tablet Take 10 mg by mouth at bedtime.     Marland Kitchen b complex vitamins tablet Take 1 tablet by mouth daily.    . B-D UF III MINI PEN NEEDLES 31G X 5 MM MISC U UTD TO INJECT INSULIN  5  . Cholecalciferol (HM VITAMIN D3) 4000 UNITS CAPS Take 1,000 Units by mouth daily.     . citalopram (CELEXA) 20 MG tablet Take 20 mg by mouth daily.    Marland Kitchen COENZYME Q-10 PO Take 1 tablet by mouth.    . diclofenac sodium (VOLTAREN) 1 % GEL Apply 4 g topically 4 (four) times daily as needed (For pain.).     Marland Kitchen diltiazem (DILACOR XR) 180 MG 24 hr capsule Take 1 capsule (180 mg total) by mouth daily. 90 capsule 3  . docusate sodium (COLACE) 100 MG capsule Take 100 mg by mouth daily as needed for mild constipation.    . febuxostat (ULORIC) 40 MG tablet Take 40 mg by mouth daily.    . fexofenadine (ALLEGRA) 180 MG tablet Take 180 mg  by mouth daily.    . furosemide (LASIX) 20 MG tablet Take 20 mg by mouth daily.     . hydrocortisone (ANUSOL-HC) 25 MG suppository Place 25 mg rectally 2 (two) times daily.    . insulin lispro (HUMALOG) 100 UNIT/ML injection Inject into the skin 3 (three) times daily before meals. Injects 16 units    . isosorbide mononitrate (IMDUR) 60 MG 24 hr tablet Take 1 tablet (60 mg total) by mouth daily. 90 tablet 3  . Liraglutide (VICTOZA) 18 MG/3ML SOPN Inject 1.2 mg into the skin daily.     Marland Kitchen loperamide (IMODIUM A-D) 2 MG tablet Take 2 mg by mouth 4 (four) times daily as needed for diarrhea or loose stools.    . Melatonin 5 MG TABS Take 10 mg by mouth at bedtime.    . methocarbamol (ROBAXIN) 500 MG tablet Take 500 mg by mouth 4 (four) times daily.    . Multiple Vitamins-Minerals (MULTIVITAMIN WITH MINERALS) tablet Take 1 tablet by mouth daily.    . nitroGLYCERIN (NITROSTAT) 0.4 MG SL tablet Place 1 tablet (0.4 mg total) under the tongue every 5 (five) minutes as needed for chest pain. 25 tablet prn  . pregabalin (LYRICA) 100 MG capsule Take 1 capsule (100 mg total) by mouth 2 (two) times daily. 60 capsule 0  . Rivastigmine 13.3 MG/24HR PT24 Place 1 patch (13.3 mg total) onto the skin daily. 30 patch 0  . spironolactone (ALDACTONE) 25 MG tablet Take 25 mg by mouth 2 (two) times daily.     Marland Kitchen tiZANidine (ZANAFLEX) 2 MG tablet Take 2 mg by mouth every 6 (six) hours as needed for muscle spasms.    Nelva Nay SOLOSTAR 300 UNIT/ML SOPN Inject 80 mg into the skin at bedtime.   5  . traMADol (ULTRAM) 50 MG tablet Take 50 mg by mouth every 6 (six) hours as needed.    . vitamin B-12 (CYANOCOBALAMIN) 1000 MCG tablet Take 1,000 mcg by mouth daily.     No current facility-administered medications for this visit.     Allergies:    Allergies  Allergen Reactions  . Avandia [Rosiglitazone] Other (See Comments)    edema  . Benazepril Other (See Comments)    unknown  . Dilaudid [Hydromorphone Hcl] Other (See  Comments)    "does not tolerate strong pain medications" per pt  . Fosamax [Alendronate Sodium] Nausea And Vomiting  . Talwin [  Pentazocine] Nausea And Vomiting  . Tetanus Toxoids Other (See Comments)    "allergic to something in the tetanus injection" per patient  . Zyrtec [Cetirizine] Other (See Comments)    unknown    Social History:  The patient  reports that she quit smoking about 50 years ago. Her smoking use included cigarettes. She has a 27.00 pack-year smoking history. She has never used smokeless tobacco. She reports that she does not drink alcohol or use drugs.   Family History  Problem Relation Age of Onset  . Dementia Maternal Grandmother   . Heart disease Mother   . Heart disease Sister   . Alzheimer's disease Sister   . Dementia Maternal Aunt   . Mental illness Other     ROS:  Please see the history of present illness.    All other review of systems negative.  PHYSICAL EXAM: VS:  BP 124/68   Pulse 80   Ht 5' (1.524 m)   Wt 214 lb 3.2 oz (97.2 kg)   SpO2 94%   BMI 41.83 kg/m  GEN: Well nourished, well developed, in no acute distress  HEENT: normal  Neck: no JVD, carotid bruits, or masses Cardiac: RRR; 2/6 SM, no rubs, or gallops,no edema  Respiratory:  clear to auscultation bilaterally, normal work of breathing GI: soft, nontender, nondistended, + BS MS: no deformity or atrophy  Skin: warm and dry, no rash Neuro:  Alert and Oriented x 3, Strength and sensation are intact Psych: euthymic mood, full affect   EKG:   06/07/2018-normal sinus rhythm 80 with nonspecific ST-T wave changes personally reviewed     ECHO: 04/27/17 - Hyperdynamic LVEF, moderate mitral and aortic stenosis. Mild   pulmonary hypertension.  ASSESSMENT AND PLAN:  Paroxysmal atrial fibrillation -we will continue with anticoagulation Eliquis 5 mg twice a day. She has a several year, remote history of GI bleed. This was during the [place] war. She understands the bleeding risks involved. She  does utilize a walker and she is at a fall risk.  Rare rectal bleeding has resolved. Hg last 14.5.  Overall she has been doing quite well without any bleeding episodes.  Continue with Eliquis for stroke prevention.  She is currently in sinus rhythm.  Aortic stenosis - Previously moderate to severe in December 2015. - ECHO stable in 2019 with moderate aortic stenosis once again. No changes. Moderate.  Murmur appreciated on exam.  No changes in symptoms.  Hyperlipidemia -continue with atorvastatin 10 mg.  - When she is off of her fibrate, her triglycerides are 555. She is back on. -Continue to promote good diet and exercise to the best of her abilities  Morbid Obesity -encourage weight loss. Decrease carbs, exercise as best as possible.  Once again encourage daily movement as best to her abilities.  Decrease caloric intake.  Diabetes -per Dr. Tamala Julian.  Overall no changes  Chronic anticoagulation -Eliquis. - Watching basic metabolic profile and CBC  Prior syncope  -fall in the shower after she took nitroglycerin. She occasionally will have sharp fleeting chest pain that sometimes wakes her up from sleep. I instructed her that this is likely musculoskeletal and noncardiac and she should use her nitroglycerin sparingly. aortic stenosis - moderate , we will continue to monitor. No longer on propanolol -She is also off of losartan.  She is feeling better since some of her blood pressure medications have been discontinued.  Advanced directives - She states that she and Dr. Tamala Julian discussed advanced directives at a prior office  visit.  I did ask her if she was interested in DNR status.  Explained at length what this means, no CPR no ventilator no shocks.  She is in agreement with this plan.  I did state that this does not mean do not treat if you have a severe medical condition that could be reversible.  I have given her a yellow DNR form to take back home with her.  She was appreciative of this.  She  desires a quick painless death.  1 year follow-up.  Signed, Candee Furbish, MD [facility]  06/07/2018 10:23 AM

## 2018-06-07 NOTE — Patient Instructions (Signed)
Medication Instructions:  Your provider recommends that you continue on your current medications as directed. Please refer to the Current Medication list given to you today.   If you need a refill on your cardiac medications before your next appointment, please call your pharmacy.    Follow-Up: At [facility], you and your health needs are our priority.  As part of our continuing mission to provide you with exceptional heart care, we have created designated Provider Care Teams.  These Care Teams include your primary Cardiologist (physician) and Advanced Practice Providers (APPs -  Physician Assistants and Nurse Practitioners) who all work together to provide you with the care you need, when you need it. You will need a follow up appointment in 12 months.  Please call our office 2 months in advance to schedule this appointment.  You may see Dr. Marlou Porch or one of the following Advanced Practice Providers on your designated Care Team:   Truitt Merle, NP Cecilie Kicks, NP . Kathyrn Drown, NP

## 2018-06-08 NOTE — Progress Notes (Deleted)
NEUROLOGY FOLLOW UP OFFICE NOTE  Kara Ortega [id number]  HISTORY OF PRESENT ILLNESS: Kara Ortega is an 85 year old right-handed woman with Alzheimer's disease, A. fib, CHF, unstable angina, hypertension, fibromyalgia, sleep apnea, hypothyroidism, hyperlipidemia, type 2 diabetes with neuropathy, and history of basal cell carcinoma who follows up for benign essential tremor.  She is accompanied by her son who supplements history.  UPDATE: The patient was last seen in February 2018. ***  HISTORY: She began having memory problems several years ago, while living in [place], [place], however it has more noticeably progressed over the past 1 to 2 years. She moved to [place] last year. It started with short-term memory problems. She will forget content of conversation. She would repeat herself frequently. She frequently repeats questions to reconfirm things. She requires help with administering her medications. Her children set up her medications for the week in a pillbox. She usually takes it correctly, but infrequently, she will miss a dose, usually because she is confused about the day of the week, even though it is written on the box. She has problems with math and gets confused calculating the amount of insulin she uses. She has left the stove on in the past. She usually has meals prepared by her children or she gets [place] take-out. She will use the stove occasionally to cook soup. She now finds that she will forget names of acquaintances, as well as some family such as her sister. She seldom drives, usually only to the [employer] which is behind her house. [place] would [place] [facility] [place]2 a mile away. She eats breakfast, but will typically graze the rest of the day. She is able to perform her ADLs and keeps the house clean and does the laundry. She does have difficulty with some simple tasks, such as operating the washer and dryer. She no  longer handles her finances or pays the bills (her son does it). She lives by herself. In the past, she used to see people in the house who weren't there, but this hasn't happened for at least a year. She does not exhibit delusions. She denies depression. She does have difficulty sleeping at times. She goes to bed around midnight and wakes up about 1.5 hours later. She will keep herself occupied by cooking soup until about 4 am. She will watch TV and drift in and out of sleep until getting up for the day around 11am. She often spends most of the day sitting and watching TV. She takes Exelon 9.5mg  patch.  Her sister has Alzheimer's. Her maternal grandmother and aunt had dementia. 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 She has a master's degree in administration and supervision.  She also has recurrent falls. He has had gait instability for many years due to imbalance. Over the past year, she has had some falls. Often she says her left knee gives out. She also reports pain in the left knee. She has some known mild weakness in the left leg and was told by her former neurologist that she may have had a small stroke  She has essential tremor:  After discussing with her cardiologist, it appears Eliquis is only contraindicated with strong CYP3A4 and P-gp inhibitors and inducers.  Although primidone is only a CYP3A4 inducer, there may still be concern that it would lower efficacy of Eliquis.  She is  on low dose metoprolol 25mg , so low dose propranolol seemed okay to try.  However, when she was started on propranolol 60mg , she became hypotensive and bradycardic, causing falls, thus propranolol was discontinued.  PAST MEDICAL HISTORY: Past Medical History:  Diagnosis Date  . A-fib ([place])   . Abnormal heart rhythms   . Alzheimer disease ([place])   . Anginal pain ([place])   . Back pain   . CHF  (congestive heart failure) ([place])   . Chronic kidney disease    STAGE 3   . COPD (chronic obstructive pulmonary disease) ([place])   . Depression   . Diabetes ([place])    insulin dependent  . Hypertension   . Leg pain   . Memory loss   . Neuropathy   . Shortness of breath   . Sleep apnea    uses bipap X12 years.  . Stroke [facility])    LEFT SIDE RESIDUAL    MEDICATIONS: Current Outpatient Medications on File Prior to Visit  Medication Sig Dispense Refill  . acetaminophen (TYLENOL) 325 MG tablet Take 650 mg by mouth every 6 (six) hours as needed.    Marland Kitchen allopurinol (ZYLOPRIM) 100 MG tablet Take 100 mg by mouth 2 (two) times daily.    Marland Kitchen ALPRAZolam (XANAX) 0.25 MG tablet Take 0.25 mg by mouth at bedtime.     Marland Kitchen apixaban (ELIQUIS) 5 MG TABS tablet Take 1 tablet (5 mg total) by mouth 2 (two) times daily. 180 tablet 3  . Ascorbic Acid (VITAMIN C) 1000 MG tablet Take 1,000 mg by mouth daily.    Marland Kitchen atorvastatin (LIPITOR) 10 MG tablet Take 10 mg by mouth at bedtime.     Marland Kitchen b complex vitamins tablet Take 1 tablet by mouth daily.    . B-D UF III MINI PEN NEEDLES 31G X 5 MM MISC U UTD TO INJECT INSULIN  5  . Cholecalciferol (HM VITAMIN D3) 4000 UNITS CAPS Take 1,000 Units by mouth daily.     . citalopram (CELEXA) 20 MG tablet Take 20 mg by mouth daily.    Marland Kitchen COENZYME Q-10 PO Take 1 tablet by mouth.    . diclofenac sodium (VOLTAREN) 1 % GEL Apply 4 g topically 4 (four) times daily as needed (For pain.).     Marland Kitchen diltiazem (DILACOR XR) 180 MG 24 hr capsule Take 1 capsule (180 mg total) by mouth daily. 90 capsule 3  . docusate sodium (COLACE) 100 MG capsule Take 100 mg by mouth daily as needed for mild constipation.    . febuxostat (ULORIC) 40 MG tablet Take 40 mg by mouth daily.    . fexofenadine (ALLEGRA) 180 MG tablet Take 180 mg by mouth daily.    . furosemide (LASIX) 20 MG tablet Take 20 mg by mouth daily.     . hydrocortisone (ANUSOL-HC) 25 MG suppository Place 25 mg rectally 2 (two) times daily.    .  insulin lispro (HUMALOG) 100 UNIT/ML injection Inject into the skin 3 (three) times daily before meals. Injects 16 units    . isosorbide mononitrate (IMDUR) 60 MG 24 hr tablet Take 1 tablet (60 mg total) by mouth daily. 90 tablet 3  . Liraglutide (VICTOZA) 18 MG/3ML SOPN Inject 1.2 mg into the skin daily.     Marland Kitchen loperamide (IMODIUM A-D) 2 MG tablet Take 2 mg by mouth 4 (four) times daily as needed for diarrhea or loose stools.    . Melatonin 5 MG TABS Take 10 mg by mouth at bedtime.    Marland Kitchen  methocarbamol (ROBAXIN) 500 MG tablet Take 500 mg by mouth 4 (four) times daily.    . Multiple Vitamins-Minerals (MULTIVITAMIN WITH MINERALS) tablet Take 1 tablet by mouth daily.    . nitroGLYCERIN (NITROSTAT) 0.4 MG SL tablet Place 1 tablet (0.4 mg total) under the tongue every 5 (five) minutes as needed for chest pain. 25 tablet prn  . pregabalin (LYRICA) 100 MG capsule Take 1 capsule (100 mg total) by mouth 2 (two) times daily. 60 capsule 0  . Rivastigmine 13.3 MG/24HR PT24 Place 1 patch (13.3 mg total) onto the skin daily. 30 patch 0  . spironolactone (ALDACTONE) 25 MG tablet Take 25 mg by mouth 2 (two) times daily.     Marland Kitchen tiZANidine (ZANAFLEX) 2 MG tablet Take 2 mg by mouth every 6 (six) hours as needed for muscle spasms.    Nelva Nay SOLOSTAR 300 UNIT/ML SOPN Inject 80 mg into the skin at bedtime.   5  . traMADol (ULTRAM) 50 MG tablet Take 50 mg by mouth every 6 (six) hours as needed.    . vitamin B-12 (CYANOCOBALAMIN) 1000 MCG tablet Take 1,000 mcg by mouth daily.     No current facility-administered medications on file prior to visit.     ALLERGIES: Allergies  Allergen Reactions  . Avandia [Rosiglitazone] Other (See Comments)    edema  . Benazepril Other (See Comments)    unknown  . Dilaudid [Hydromorphone Hcl] Other (See Comments)    "does not tolerate strong pain medications" per pt  . Fosamax [Alendronate Sodium] Nausea And Vomiting  . Talwin [Pentazocine] Nausea And Vomiting  . Tetanus Toxoids  Other (See Comments)    "allergic to something in the tetanus injection" per patient  . Zyrtec [Cetirizine] Other (See Comments)    unknown    FAMILY HISTORY: Family History  Problem Relation Age of Onset  . Dementia Maternal Grandmother   . Heart disease Mother   . Heart disease Sister   . Alzheimer's disease Sister   . Dementia Maternal Aunt   . Mental illness Other    SOCIAL HISTORY: Social History   Socioeconomic History  . Marital status: Single    Spouse name: Not on file  . Number of children: 3  . Years of education: Not on file  . Highest education level: Not on file  Occupational History  . Occupation: retired  Scientific laboratory technician  . Financial resource strain: Not on file  . Food insecurity:    Worry: Not on file    Inability: Not on file  . Transportation needs:    Medical: Not on file    Non-medical: Not on file  Tobacco Use  . Smoking status: Former Smoker    Packs/day: 1.50    Years: 18.00    Pack years: 27.00    Types: Cigarettes    Last attempt to quit: 03/31/1968    Years since quitting: 50.2  . Smokeless tobacco: Never Used  Substance and Sexual Activity  . Alcohol use: No    Alcohol/week: 0.0 standard drinks  . Drug use: No  . Sexual activity: Never  Lifestyle  . Physical activity:    Days per week: Not on file    Minutes per session: Not on file  . Stress: Not on file  Relationships  . Social connections:    Talks on phone: Not on file    Gets together: Not on file    Attends religious service: Not on file    Active member of club or  organization: Not on file    Attends meetings of clubs or organizations: Not on file    Relationship status: Not on file  . Intimate partner violence:    Fear of current or ex partner: Not on file    Emotionally abused: Not on file    Physically abused: Not on file    Forced sexual activity: Not on file  Other Topics Concern  . Not on file  Social History Narrative  . Not on file    REVIEW OF  SYSTEMS: Constitutional: No fevers, chills, or sweats, no generalized fatigue, change in appetite Eyes: No visual changes, double vision, eye pain Ear, nose and throat: No hearing loss, ear pain, nasal congestion, sore throat Cardiovascular: No chest pain, palpitations Respiratory:  No shortness of breath at rest or with exertion, wheezes GastrointestinaI: No nausea, vomiting, diarrhea, abdominal pain, fecal incontinence Genitourinary:  No dysuria, urinary retention or frequency Musculoskeletal:  No neck pain, back pain Integumentary: No rash, pruritus, skin lesions Neurological: as above Psychiatric: No depression, insomnia, anxiety Endocrine: No palpitations, fatigue, diaphoresis, mood swings, change in appetite, change in weight, increased thirst Hematologic/Lymphatic:  No purpura, petechiae. Allergic/Immunologic: no itchy/runny eyes, nasal congestion, recent allergic reactions, rashes  PHYSICAL EXAM: *** General: No acute distress.  Patient appears ***-groomed.   Head:  Normocephalic/atraumatic Eyes:  Fundi examined but not visualized Neck: supple, no paraspinal tenderness, full range of motion Heart:  Regular rate and rhythm Lungs:  Clear to auscultation bilaterally Back: No paraspinal tenderness Neurological Exam: alert and oriented to person, place, and time.  Attention span and concentration fair.  Delayed recall poor.  Remote memory intact.  Fund of knowledge intact.  Speech fluent and not dysarthric, language intact.  CN II-XII intact.  Bulk and tone normal.  Muscle strength 5/5 throughout.  Sensation to light touch intact.  Deep tendon reflexes 2+ throughout.  Finger-to-nose testing revealed postural and kinetic tremor bilaterally.  Some bilateral resting tremor noted.  No rigidity.  Decreased finger tapping amplitude but otherwise no bradykinesia.  Wide-based gait but no shuffling.  IMPRESSION: 1.  Benign essential tremor 2.  Alzheimer's disease  PLAN: ***  Metta Clines,  DO  CC: Carol Ada, MD

## 2018-06-09 ENCOUNTER — Ambulatory Visit: Payer: Medicare Other | Admitting: Neurology

## 2018-07-12 ENCOUNTER — Encounter (HOSPITAL_COMMUNITY): Payer: Self-pay

## 2018-07-12 ENCOUNTER — Emergency Department (HOSPITAL_COMMUNITY): Payer: Medicare Other

## 2018-07-12 ENCOUNTER — Emergency Department (HOSPITAL_COMMUNITY)
Admission: EM | Admit: 2018-07-12 | Discharge: 2018-07-12 | Disposition: A | Discharge status: Home / Self Care | Payer: Medicare Other | Attending: Emergency Medicine | Admitting: Emergency Medicine

## 2018-07-12 DIAGNOSIS — S199XXA Unspecified injury of neck, initial encounter: Secondary | ICD-10-CM | POA: Diagnosis not present

## 2018-07-12 DIAGNOSIS — E119 Type 2 diabetes mellitus without complications: Secondary | ICD-10-CM | POA: Insufficient documentation

## 2018-07-12 DIAGNOSIS — M545 Low back pain, unspecified: Secondary | ICD-10-CM

## 2018-07-12 DIAGNOSIS — Z794 Long term (current) use of insulin: Secondary | ICD-10-CM | POA: Diagnosis not present

## 2018-07-12 DIAGNOSIS — S0003XA Contusion of scalp, initial encounter: Secondary | ICD-10-CM | POA: Insufficient documentation

## 2018-07-12 DIAGNOSIS — J449 Chronic obstructive pulmonary disease, unspecified: Secondary | ICD-10-CM | POA: Diagnosis not present

## 2018-07-12 DIAGNOSIS — F0390 Unspecified dementia without behavioral disturbance: Secondary | ICD-10-CM | POA: Insufficient documentation

## 2018-07-12 DIAGNOSIS — Z79899 Other long term (current) drug therapy: Secondary | ICD-10-CM | POA: Insufficient documentation

## 2018-07-12 DIAGNOSIS — W19XXXA Unspecified fall, initial encounter: Secondary | ICD-10-CM | POA: Insufficient documentation

## 2018-07-12 DIAGNOSIS — N183 Chronic kidney disease, stage 3 (moderate): Secondary | ICD-10-CM | POA: Diagnosis not present

## 2018-07-12 DIAGNOSIS — M533 Sacrococcygeal disorders, not elsewhere classified: Secondary | ICD-10-CM | POA: Diagnosis not present

## 2018-07-12 DIAGNOSIS — R51 Headache: Secondary | ICD-10-CM | POA: Diagnosis not present

## 2018-07-12 DIAGNOSIS — Y999 Unspecified external cause status: Secondary | ICD-10-CM | POA: Diagnosis not present

## 2018-07-12 DIAGNOSIS — Z87891 Personal history of nicotine dependence: Secondary | ICD-10-CM | POA: Diagnosis not present

## 2018-07-12 DIAGNOSIS — I509 Heart failure, unspecified: Secondary | ICD-10-CM | POA: Diagnosis not present

## 2018-07-12 DIAGNOSIS — I130 Hypertensive heart and chronic kidney disease with heart failure and stage 1 through stage 4 chronic kidney disease, or unspecified chronic kidney disease: Secondary | ICD-10-CM | POA: Insufficient documentation

## 2018-07-12 DIAGNOSIS — I10 Essential (primary) hypertension: Secondary | ICD-10-CM | POA: Diagnosis not present

## 2018-07-12 DIAGNOSIS — Y92129 Unspecified place in nursing home as the place of occurrence of the external cause: Secondary | ICD-10-CM | POA: Diagnosis not present

## 2018-07-12 DIAGNOSIS — Y939 Activity, unspecified: Secondary | ICD-10-CM | POA: Insufficient documentation

## 2018-07-12 DIAGNOSIS — S0990XA Unspecified injury of head, initial encounter: Secondary | ICD-10-CM | POA: Diagnosis not present

## 2018-07-12 DIAGNOSIS — S3992XA Unspecified injury of lower back, initial encounter: Secondary | ICD-10-CM | POA: Diagnosis not present

## 2018-07-12 NOTE — ED Triage Notes (Signed)
Pt comes from [facility] via [facility], unwitnessed fall after going to the [facility], no LOC, hit head, hematoma to back of head, on Eliquis. C/o of pain to tailbone

## 2018-07-12 NOTE — ED Provider Notes (Signed)
[facility] EMERGENCY DEPARTMENT Provider Note   CSN: [id number] Arrival date & time: 07/12/18  1913    History   Chief Complaint Chief Complaint  Patient presents with   Fall    HPI Kara Ortega is a 85 y.o. female.     Patient with history of atrial fibrillation on Eliquis, history of Alzheimer's dementia, from [place] assisted living by ambulance --presents after a fall.  Patient was getting up from using the toilet when she fell backwards striking the back left side of her head.  She also noted on her lower back and complains of tailbone pain.  She denies loss of consciousness.  She states that earlier today she had a minor fall where she slid out of bed.  She was noted to have a hematoma on her head and was sent to the emergency department for further evaluation.  She states that she is otherwise in her normal state of health.  Level 5 caveat due to dementia.     Past Medical History:  Diagnosis Date   A-fib ([place])    Abnormal heart rhythms    Alzheimer disease (HCC)    Anginal pain (HCC)    Back pain    CHF (congestive heart failure) (HCC)    Chronic kidney disease    STAGE 3    COPD (chronic obstructive pulmonary disease) (HCC)    Depression    Diabetes (HCC)    insulin dependent   Hypertension    Leg pain    Memory loss    Neuropathy    Shortness of breath    Sleep apnea    uses bipap X12 years.   Stroke [facility])    LEFT SIDE RESIDUAL    Patient Active Problem List   Diagnosis Date Noted   Essential tremor 09/14/2014   High cholesterol 05/19/2014   Persistent atrial fibrillation 05/19/2014   Long-term use of high-risk medication 05/19/2014   Accident due to mechanical fall without injury 10/62/6948   Diastolic dysfunction with chronic heart failure ([place]) 03/24/2014   Sinus bradycardia 03/24/2014   Hypotension 03/24/2014   OSA (obstructive sleep apnea) 03/24/2014   Warfarin anticoagulation 03/24/2014     Aortic stenosis 03/24/2014   Mitral stenosis 03/24/2014   DOE (dyspnea on exertion) 03/24/2014   Recurrent falls 03/24/2014   AKI (acute kidney injury) ([place]) 03/23/2014   Alzheimer's dementia ([place]) 12/26/2013   Unstable angina ([place]) 09/14/2013   Diarrhea 09/14/2013   Chest pain at rest- most likely GI related 09/14/2013   CHF (congestive heart failure) ([place])    A-fib ([place])    Diabetes ([place])    Dyspnea on exertion 03/25/2013    Past Surgical History:  Procedure Laterality Date   BREAST LUMPECTOMY     BREAST SURGERY     X4   COLON SURGERY     removed 14 inches of colon   HERNIA REPAIR       OB History   No obstetric history on file.      Home Medications    Prior to Admission medications   Medication Sig Start Date End Date Taking? Authorizing Provider  acetaminophen (TYLENOL) 325 MG tablet Take 650 mg by mouth every 6 (six) hours as needed.    [provider]  allopurinol (ZYLOPRIM) 100 MG tablet Take 100 mg by mouth 2 (two) times daily.    [provider]  ALPRAZolam Duanne Moron) 0.25 MG tablet Take 0.25 mg by mouth at bedtime.     [provider]  apixaban (ELIQUIS) 5 MG TABS tablet Take 1 tablet (5 mg total) by mouth 2 (two) times daily. 02/19/15   Isaiah Serge, NP  Ascorbic Acid (VITAMIN C) 1000 MG tablet Take 1,000 mg by mouth daily.    [provider]  atorvastatin (LIPITOR) 10 MG tablet Take 10 mg by mouth at bedtime.     [provider]  b complex vitamins tablet Take 1 tablet by mouth daily.    [provider]  B-D UF III MINI PEN NEEDLES 31G X 5 MM MISC U UTD TO INJECT INSULIN 02/25/15   [provider]  Cholecalciferol (HM VITAMIN D3) 4000 UNITS CAPS Take 1,000 Units by mouth daily.     [provider]  citalopram (CELEXA) 20 MG tablet Take 20 mg by mouth daily.    [provider]  COENZYME Q-10 PO Take 1 tablet by mouth.    [provider]  diclofenac  sodium (VOLTAREN) 1 % GEL Apply 4 g topically 4 (four) times daily as needed (For pain.).     [provider]  diltiazem (DILACOR XR) 180 MG 24 hr capsule Take 1 capsule (180 mg total) by mouth daily. 04/22/17   Isaiah Serge, NP  docusate sodium (COLACE) 100 MG capsule Take 100 mg by mouth daily as needed for mild constipation.    [provider]  febuxostat (ULORIC) 40 MG tablet Take 40 mg by mouth daily.    [provider]  fexofenadine (ALLEGRA) 180 MG tablet Take 180 mg by mouth daily.    [provider]  furosemide (LASIX) 20 MG tablet Take 20 mg by mouth daily.     [provider]  hydrocortisone (ANUSOL-HC) 25 MG suppository Place 25 mg rectally 2 (two) times daily.    [provider]  insulin lispro (HUMALOG) 100 UNIT/ML injection Inject into the skin 3 (three) times daily before meals. Injects 16 units    [provider]  isosorbide mononitrate (IMDUR) 60 MG 24 hr tablet Take 1 tablet (60 mg total) by mouth daily. 04/27/17   Isaiah Serge, NP  Liraglutide (VICTOZA) 18 MG/3ML SOPN Inject 1.2 mg into the skin daily.     [provider]  loperamide (IMODIUM A-D) 2 MG tablet Take 2 mg by mouth 4 (four) times daily as needed for diarrhea or loose stools.    [provider]  Melatonin 5 MG TABS Take 10 mg by mouth at bedtime.    [provider]  methocarbamol (ROBAXIN) 500 MG tablet Take 500 mg by mouth 4 (four) times daily.    [provider]  Multiple Vitamins-Minerals (MULTIVITAMIN WITH MINERALS) tablet Take 1 tablet by mouth daily.    [provider]  nitroGLYCERIN (NITROSTAT) 0.4 MG SL tablet Place 1 tablet (0.4 mg total) under the tongue every 5 (five) minutes as needed for chest pain. 05/19/14   Jerline Pain, MD  pregabalin (LYRICA) 100 MG capsule Take 1 capsule (100 mg total) by mouth 2 (two) times daily. 07/25/16   Orpah Greek, MD  Rivastigmine 13.3 MG/24HR PT24 Place  1 patch (13.3 mg total) onto the skin daily. 04/27/15   Pieter Partridge, DO  spironolactone (ALDACTONE) 25 MG tablet Take 25 mg by mouth 2 (two) times daily.     [provider]  tiZANidine (ZANAFLEX) 2 MG tablet Take 2 mg by mouth every 6 (six) hours as needed for muscle spasms.    [provider]  Nelva Nay  SOLOSTAR 300 UNIT/ML SOPN Inject 80 mg into the skin at bedtime.  05/22/14   [provider]  traMADol (ULTRAM) 50 MG tablet Take 50 mg by mouth every 6 (six) hours as needed.    [provider]  vitamin B-12 (CYANOCOBALAMIN) 1000 MCG tablet Take 1,000 mcg by mouth daily.    [provider]    Family History Family History  Problem Relation Age of Onset   Dementia Maternal Grandmother    Heart disease Mother    Heart disease Sister    Alzheimer's disease Sister    Dementia Maternal Aunt    Mental illness Other     Social History Social History   Tobacco Use   Smoking status: Former Smoker    Packs/day: 1.50    Years: 18.00    Pack years: 27.00    Types: Cigarettes    Last attempt to quit: 03/31/1968    Years since quitting: 50.3   Smokeless tobacco: Never Used  Substance Use Topics   Alcohol use: No    Alcohol/week: 0.0 standard drinks   Drug use: No     Allergies   Avandia [rosiglitazone]; Benazepril; Dilaudid [hydromorphone hcl]; Fosamax [alendronate sodium]; Talwin [pentazocine]; Tetanus toxoids; and Zyrtec [cetirizine]   Review of Systems Review of Systems  Unable to perform ROS: Dementia  Respiratory: Negative for shortness of breath.   Cardiovascular: Negative for chest pain.  Musculoskeletal: Positive for back pain. Negative for neck pain.  Skin: Negative for wound.  Neurological: Positive for headaches.     Physical Exam Updated Vital Signs BP 140/70    Pulse 79    Temp 98.4 F (36.9 C) (Oral)    Resp 14    SpO2 94%   Physical Exam Vitals signs and nursing note reviewed.  Constitutional:       Appearance: She is well-developed.  HENT:     Head: Normocephalic. No raccoon eyes or Battle's sign.     Comments: Left occipital hematoma without laceration or active bleeding.    Right Ear: Tympanic membrane, ear canal and external ear normal. No hemotympanum.     Left Ear: Tympanic membrane, ear canal and external ear normal. No hemotympanum.     Nose: Nose normal.     Mouth/Throat:     Pharynx: Uvula midline.  Eyes:     General: Lids are normal.     Extraocular Movements:     Right eye: No nystagmus.     Left eye: No nystagmus.     Conjunctiva/sclera: Conjunctivae normal.     Pupils: Pupils are equal, round, and reactive to light.     Comments: No visible hyphema noted  Neck:     Musculoskeletal: Normal range of motion and neck supple.  Cardiovascular:     Rate and Rhythm: Normal rate and regular rhythm.  Pulmonary:     Effort: Pulmonary effort is normal.     Breath sounds: Normal breath sounds.  Abdominal:     Palpations: Abdomen is soft.     Tenderness: There is no abdominal tenderness.  Musculoskeletal:        General: No swelling.     Cervical back: She exhibits tenderness. She exhibits normal range of motion and no bony tenderness.     Thoracic back: She exhibits no tenderness and no bony tenderness.     Lumbar back: She exhibits tenderness and bony tenderness.     Comments: Patient is able to move all extremities without difficulty.  She is able to  lift her legs without any hip pain.  Skin:    General: Skin is warm and dry.  Neurological:     Mental Status: She is alert and oriented to person, place, and time.     GCS: GCS eye subscore is 4. GCS verbal subscore is 5. GCS motor subscore is 6.     Cranial Nerves: No cranial nerve deficit.     Sensory: No sensory deficit.     Coordination: Coordination normal.     Deep Tendon Reflexes: Reflexes are normal and symmetric.      ED Treatments / Results  Labs (all labs ordered are listed, but only abnormal results  are displayed) Labs Reviewed - No data to display  EKG EKG Interpretation  Date/Time:  Monday July 12 2018 19:39:25 EDT Ventricular Rate:  78 PR Interval:    QRS Duration: 99 QT Interval:  390 QTC Calculation: 445 R Axis:   75 Text Interpretation:  Sinus rhythm Borderline T abnormalities, inferior leads Artifact Abnormal ekg Confirmed by Carmin Muskrat [phone]) on 07/12/2018 8:00:49 PM   Radiology Dg Sacrum/coccyx  Result Date: 07/12/2018 CLINICAL DATA:  Recent fall with tailbone pain, initial encounter EXAM: SACRUM AND COCCYX - 2+ VIEW COMPARISON:  05/27/2018 FINDINGS: Pelvic ring is intact. Postsurgical changes are noted. The sacral are within normal limits. No definitive fracture is seen. IMPRESSION: No acute abnormality noted. Electronically Signed   By: Inez Catalina M.D.   On: 07/12/2018 20:27   Ct Head Wo Contrast  Result Date: 07/12/2018 CLINICAL DATA:  85 y/o F; unwitnessed fall, no loss of consciousness, head injury with hematoma to the back of head. EXAM: CT HEAD WITHOUT CONTRAST CT CERVICAL SPINE WITHOUT CONTRAST TECHNIQUE: Multidetector CT imaging of the head and cervical spine was performed following the standard protocol without intravenous contrast. Multiplanar CT image reconstructions of the cervical spine were also generated. COMPARISON:  07/26/2018 CT of the head and cervical spine. FINDINGS: CT HEAD FINDINGS Brain: No evidence of acute infarction, hemorrhage, hydrocephalus, extra-axial collection or mass lesion/mass effect. Interval very small chronic infarction within the right cerebellar hemisphere. Stable nonspecific white matter hypodensities compatible with chronic microvascular ischemic changes and stable volume loss of the brain. Vascular: Calcific atherosclerosis of carotid siphons. No hyperdense vessel identified. Skull: Small left occipital region scalp contusion. Sinuses/Orbits: Mild mucosal thickening within the anterior ethmoid air cells and the maxillary  sinuses. Additional visible paranasal sinuses and the mastoid air cells are normally aerated. Debris within the left external auditory canal, likely cerumen. Other: Bilateral intra-ocular lens replacement. CT CERVICAL SPINE FINDINGS Alignment: Stable C6-7 grade 1 anterolisthesis. Skull base and vertebrae: No acute fracture. No primary bone lesion or focal pathologic process. Soft tissues and spinal canal: No prevertebral fluid or swelling. No visible canal hematoma. Disc levels: Stable spondylosis of the cervical spine with multilevel disc and facet degenerative changes. Intervertebral disc space height is greatest at the C4-C6 levels. Uncovertebral and facet hypertrophy encroach on the neural foramen the bilateral C3-4, bilateral C4-5, bilateral C5-6 levels. No high-grade bony spinal canal stenosis. Upper chest: Negative. Other: Multiple subcentimeter thyroid nodules and small thyroid calcifications. Calcific atherosclerosis of the carotid siphons. IMPRESSION: CT head: 1. Small left occipital region scalp contusion. No calvarial fracture. 2. No acute intracranial abnormality. 3. Interval very small chronic infarction within the right cerebellar hemisphere. 4. Stable chronic microvascular ischemic changes and parenchymal volume loss of the brain. CT cervical spine: 1. No acute fracture or dislocation of the cervical spine. 2. Stable cervical spondylosis greatest  at the C4-C6 levels. No high-grade bony canal stenosis. Electronically Signed   By: Kristine Garbe M.D.   On: 07/12/2018 20:16   Ct Cervical Spine Wo Contrast  Result Date: 07/12/2018 CLINICAL DATA:  85 y/o F; unwitnessed fall, no loss of consciousness, head injury with hematoma to the back of head. EXAM: CT HEAD WITHOUT CONTRAST CT CERVICAL SPINE WITHOUT CONTRAST TECHNIQUE: Multidetector CT imaging of the head and cervical spine was performed following the standard protocol without intravenous contrast. Multiplanar CT image reconstructions of  the cervical spine were also generated. COMPARISON:  07/26/2018 CT of the head and cervical spine. FINDINGS: CT HEAD FINDINGS Brain: No evidence of acute infarction, hemorrhage, hydrocephalus, extra-axial collection or mass lesion/mass effect. Interval very small chronic infarction within the right cerebellar hemisphere. Stable nonspecific white matter hypodensities compatible with chronic microvascular ischemic changes and stable volume loss of the brain. Vascular: Calcific atherosclerosis of carotid siphons. No hyperdense vessel identified. Skull: Small left occipital region scalp contusion. Sinuses/Orbits: Mild mucosal thickening within the anterior ethmoid air cells and the maxillary sinuses. Additional visible paranasal sinuses and the mastoid air cells are normally aerated. Debris within the left external auditory canal, likely cerumen. Other: Bilateral intra-ocular lens replacement. CT CERVICAL SPINE FINDINGS Alignment: Stable C6-7 grade 1 anterolisthesis. Skull base and vertebrae: No acute fracture. No primary bone lesion or focal pathologic process. Soft tissues and spinal canal: No prevertebral fluid or swelling. No visible canal hematoma. Disc levels: Stable spondylosis of the cervical spine with multilevel disc and facet degenerative changes. Intervertebral disc space height is greatest at the C4-C6 levels. Uncovertebral and facet hypertrophy encroach on the neural foramen the bilateral C3-4, bilateral C4-5, bilateral C5-6 levels. No high-grade bony spinal canal stenosis. Upper chest: Negative. Other: Multiple subcentimeter thyroid nodules and small thyroid calcifications. Calcific atherosclerosis of the carotid siphons. IMPRESSION: CT head: 1. Small left occipital region scalp contusion. No calvarial fracture. 2. No acute intracranial abnormality. 3. Interval very small chronic infarction within the right cerebellar hemisphere. 4. Stable chronic microvascular ischemic changes and parenchymal volume loss  of the brain. CT cervical spine: 1. No acute fracture or dislocation of the cervical spine. 2. Stable cervical spondylosis greatest at the C4-C6 levels. No high-grade bony canal stenosis. Electronically Signed   By: Kristine Garbe M.D.   On: 07/12/2018 20:16    Procedures Procedures (including critical care time)  Medications Ordered in ED Medications - No data to display   Initial Impression / Assessment and Plan / ED Course  I have reviewed the triage vital signs and the nursing notes.  Pertinent labs & imaging results that were available during my care of the patient were reviewed by me and considered in my medical decision making (see chart for details).        Patient seen and examined. Work-up initiated. Patient is conversant.  She exhibits some mild confusion however seems to give a reliable history.  Given anticoagulation and age, will obtain imaging to rule out intracranial injury.  Vital signs reviewed and are as follows: BP 140/70    Pulse 79    Temp 98.4 F (36.9 C) (Oral)    Resp 14    SpO2 94%   9:04 PM Patient discussed with Dr. Vanita Panda who has seen patient.  EKG reviewed.  Patient was counseled on head injury precautions and symptoms that should indicate their return to the ED.  These include severe worsening headache, vision changes, confusion, loss of consciousness, trouble walking, nausea & vomiting, or weakness/tingling  in extremities.     Final Clinical Impressions(s) / ED Diagnoses   Final diagnoses:  Contusion of scalp, initial encounter  Acute midline low back pain without sciatica  Fall, initial encounter   Head injury after mechanical fall today.  Imaging negative.  Patient stable.  EKG without ischemic findings and overall no appreciable changes.  Comfortable with discharge to home at this time.  ED Discharge Orders    None       Carlisle Cater, Hershal Coria 07/12/18 2105    Carmin Muskrat, MD 07/13/18 [phone]

## 2018-07-12 NOTE — Discharge Instructions (Signed)
Please read and follow all provided instructions.  Your diagnoses today include:  1. Contusion of scalp, initial encounter   2. Acute midline low back pain without sciatica   3. Fall, initial encounter     Tests performed today include:  CT scan of your head and neck that did not show any serious injury.  X-ray of your lower back which did not show any tailbone fracture.  EKG  Vital signs. See below for your results today.   Medications prescribed:   None  Take any prescribed medications only as directed.  Home care instructions:  Follow any educational materials contained in this packet.  Follow-up instructions: Please follow-up with your primary care provider as needed for further evaluation of your symptoms.   Return instructions:  SEEK IMMEDIATE MEDICAL ATTENTION IF:  There is confusion or drowsiness (although children frequently become drowsy after injury).   You cannot awaken the injured person.   You have more than one episode of vomiting.   You notice dizziness or unsteadiness which is getting worse, or inability to walk.   You have convulsions or unconsciousness.   You experience severe, persistent headaches not relieved by Tylenol.  You cannot use arms or legs normally.   There are changes in pupil sizes. (This is the black center in the colored part of the eye)   There is clear or bloody discharge from the nose or ears.   You have change in speech, vision, swallowing, or understanding.   Localized weakness, numbness, tingling, or change in bowel or bladder control.  You have any other emergent concerns.  Additional Information: You have had a head injury which does not appear to require admission at this time.  Your vital signs today were: BP 135/67    Pulse 77    Temp 98.4 F (36.9 C) (Oral)    Resp 14    SpO2 91%  If your blood pressure (BP) was elevated above 135/85 this visit, please have this repeated by your doctor within one  month. --------------

## 2018-07-12 NOTE — ED Notes (Signed)
Patient transported to CT 

## 2018-07-12 NOTE — ED Notes (Signed)
Pt verbalizes understanding of d/c instructions. Pt taken to lobby in wheelchair. To be picked up by son who this RN spoke to over the phone.

## 2018-07-17 ENCOUNTER — Other Ambulatory Visit: Payer: Self-pay

## 2018-07-17 ENCOUNTER — Emergency Department (HOSPITAL_COMMUNITY): Payer: Medicare Other

## 2018-07-17 ENCOUNTER — Encounter (HOSPITAL_COMMUNITY): Payer: Self-pay

## 2018-07-17 ENCOUNTER — Emergency Department (HOSPITAL_COMMUNITY)
Admission: EM | Admit: 2018-07-17 | Discharge: 2018-07-17 | Disposition: A | Discharge status: Home / Self Care | Payer: Medicare Other | Attending: Emergency Medicine | Admitting: Emergency Medicine

## 2018-07-17 DIAGNOSIS — N183 Chronic kidney disease, stage 3 (moderate): Secondary | ICD-10-CM | POA: Diagnosis not present

## 2018-07-17 DIAGNOSIS — I5032 Chronic diastolic (congestive) heart failure: Secondary | ICD-10-CM | POA: Diagnosis not present

## 2018-07-17 DIAGNOSIS — Z87891 Personal history of nicotine dependence: Secondary | ICD-10-CM | POA: Diagnosis not present

## 2018-07-17 DIAGNOSIS — S8002XA Contusion of left knee, initial encounter: Secondary | ICD-10-CM | POA: Insufficient documentation

## 2018-07-17 DIAGNOSIS — Y92121 Bathroom in nursing home as the place of occurrence of the external cause: Secondary | ICD-10-CM | POA: Diagnosis not present

## 2018-07-17 DIAGNOSIS — W1830XA Fall on same level, unspecified, initial encounter: Secondary | ICD-10-CM | POA: Diagnosis not present

## 2018-07-17 DIAGNOSIS — Y9389 Activity, other specified: Secondary | ICD-10-CM | POA: Insufficient documentation

## 2018-07-17 DIAGNOSIS — S8992XA Unspecified injury of left lower leg, initial encounter: Secondary | ICD-10-CM | POA: Diagnosis not present

## 2018-07-17 DIAGNOSIS — M79662 Pain in left lower leg: Secondary | ICD-10-CM | POA: Diagnosis not present

## 2018-07-17 DIAGNOSIS — Z79899 Other long term (current) drug therapy: Secondary | ICD-10-CM | POA: Diagnosis not present

## 2018-07-17 DIAGNOSIS — Y999 Unspecified external cause status: Secondary | ICD-10-CM | POA: Insufficient documentation

## 2018-07-17 DIAGNOSIS — Z7401 Bed confinement status: Secondary | ICD-10-CM | POA: Diagnosis not present

## 2018-07-17 DIAGNOSIS — Z794 Long term (current) use of insulin: Secondary | ICD-10-CM | POA: Insufficient documentation

## 2018-07-17 DIAGNOSIS — I130 Hypertensive heart and chronic kidney disease with heart failure and stage 1 through stage 4 chronic kidney disease, or unspecified chronic kidney disease: Secondary | ICD-10-CM | POA: Diagnosis not present

## 2018-07-17 DIAGNOSIS — W19XXXA Unspecified fall, initial encounter: Secondary | ICD-10-CM | POA: Diagnosis not present

## 2018-07-17 DIAGNOSIS — I4891 Unspecified atrial fibrillation: Secondary | ICD-10-CM | POA: Diagnosis not present

## 2018-07-17 DIAGNOSIS — Z7901 Long term (current) use of anticoagulants: Secondary | ICD-10-CM | POA: Diagnosis not present

## 2018-07-17 DIAGNOSIS — S79912A Unspecified injury of left hip, initial encounter: Secondary | ICD-10-CM | POA: Diagnosis not present

## 2018-07-17 DIAGNOSIS — M25562 Pain in left knee: Secondary | ICD-10-CM | POA: Diagnosis not present

## 2018-07-17 DIAGNOSIS — E1122 Type 2 diabetes mellitus with diabetic chronic kidney disease: Secondary | ICD-10-CM | POA: Insufficient documentation

## 2018-07-17 DIAGNOSIS — G309 Alzheimer's disease, unspecified: Secondary | ICD-10-CM | POA: Diagnosis not present

## 2018-07-17 DIAGNOSIS — M2550 Pain in unspecified joint: Secondary | ICD-10-CM | POA: Diagnosis not present

## 2018-07-17 DIAGNOSIS — R52 Pain, unspecified: Secondary | ICD-10-CM | POA: Diagnosis not present

## 2018-07-17 NOTE — Discharge Instructions (Addendum)
Your x-rays are negative for fracture of the knee, the lower leg and the hip on the left.   It is recommended that you use a wheelchair to get around until you can safely use a walker and bear weight appropriately with the left leg, which will take some time. Your doctor can order physical therapy to help with this transition.   Your x-ray reports are printed below as requested:  Dg Sacrum/coccyx  Result Date: 07/12/2018 CLINICAL DATA:  Recent fall with tailbone pain, initial encounter EXAM: SACRUM AND COCCYX - 2+ VIEW COMPARISON:  05/27/2018 FINDINGS: Pelvic ring is intact. Postsurgical changes are noted. The sacral are within normal limits. No definitive fracture is seen. IMPRESSION: No acute abnormality noted. Electronically Signed   By: Inez Catalina M.D.   On: 07/12/2018 20:27   Dg Tibia/fibula Left  Result Date: 07/17/2018 CLINICAL DATA:  84 y/o F; fall with knee injury. Tender below the knee. EXAM: DG HIP (WITH OR WITHOUT PELVIS) 1V*L*; LEFT KNEE - COMPLETE 4+ VIEW; LEFT TIBIA AND FIBULA - 2 VIEW COMPARISON:  None. FINDINGS: Pelvis and left hip: There is no evidence of hip fracture or dislocation. There is no evidence of arthropathy or other focal bone abnormality. Mesh repair anchors are present over the lower abdominal wall insert trickle sutures project over mid pelvis. Left knee: No acute fracture or dislocation. Tricompartmental osteoarthrosis of the knee, mild and patellofemoral and medial femorotibial compartments, moderate to severe in the lateral femorotibial compartment. There is joint space narrowing and large periarticular osteophytes. No joint effusion. Vascular calcifications noted. Left tibia and fibula: There is no evidence of hip fracture or dislocation. Ankle joint is maintained. Dorsal calcaneal enthesophyte. IMPRESSION: 1. No acute fracture or dislocation identified. 2. Tricompartmental osteoarthrosis of the knee greatest in the lateral femorotibial compartment. Electronically  Signed   By: Kristine Garbe M.D.   On: 07/17/2018 03:48   Ct Head Wo Contrast  Result Date: 07/12/2018 CLINICAL DATA:  85 y/o F; unwitnessed fall, no loss of consciousness, head injury with hematoma to the back of head. EXAM: CT HEAD WITHOUT CONTRAST CT CERVICAL SPINE WITHOUT CONTRAST TECHNIQUE: Multidetector CT imaging of the head and cervical spine was performed following the standard protocol without intravenous contrast. Multiplanar CT image reconstructions of the cervical spine were also generated. COMPARISON:  07/26/2018 CT of the head and cervical spine. FINDINGS: CT HEAD FINDINGS Brain: No evidence of acute infarction, hemorrhage, hydrocephalus, extra-axial collection or mass lesion/mass effect. Interval very small chronic infarction within the right cerebellar hemisphere. Stable nonspecific white matter hypodensities compatible with chronic microvascular ischemic changes and stable volume loss of the brain. Vascular: Calcific atherosclerosis of carotid siphons. No hyperdense vessel identified. Skull: Small left occipital region scalp contusion. Sinuses/Orbits: Mild mucosal thickening within the anterior ethmoid air cells and the maxillary sinuses. Additional visible paranasal sinuses and the mastoid air cells are normally aerated. Debris within the left external auditory canal, likely cerumen. Other: Bilateral intra-ocular lens replacement. CT CERVICAL SPINE FINDINGS Alignment: Stable C6-7 grade 1 anterolisthesis. Skull base and vertebrae: No acute fracture. No primary bone lesion or focal pathologic process. Soft tissues and spinal canal: No prevertebral fluid or swelling. No visible canal hematoma. Disc levels: Stable spondylosis of the cervical spine with multilevel disc and facet degenerative changes. Intervertebral disc space height is greatest at the C4-C6 levels. Uncovertebral and facet hypertrophy encroach on the neural foramen the bilateral C3-4, bilateral C4-5, bilateral C5-6 levels.  No high-grade bony spinal canal stenosis. Upper chest: Negative. Other:  Multiple subcentimeter thyroid nodules and small thyroid calcifications. Calcific atherosclerosis of the carotid siphons. IMPRESSION: CT head: 1. Small left occipital region scalp contusion. No calvarial fracture. 2. No acute intracranial abnormality. 3. Interval very small chronic infarction within the right cerebellar hemisphere. 4. Stable chronic microvascular ischemic changes and parenchymal volume loss of the brain. CT cervical spine: 1. No acute fracture or dislocation of the cervical spine. 2. Stable cervical spondylosis greatest at the C4-C6 levels. No high-grade bony canal stenosis. Electronically Signed   By: Kristine Garbe M.D.   On: 07/12/2018 20:16   Ct Cervical Spine Wo Contrast  Result Date: 07/12/2018 CLINICAL DATA:  85 y/o F; unwitnessed fall, no loss of consciousness, head injury with hematoma to the back of head. EXAM: CT HEAD WITHOUT CONTRAST CT CERVICAL SPINE WITHOUT CONTRAST TECHNIQUE: Multidetector CT imaging of the head and cervical spine was performed following the standard protocol without intravenous contrast. Multiplanar CT image reconstructions of the cervical spine were also generated. COMPARISON:  07/26/2018 CT of the head and cervical spine. FINDINGS: CT HEAD FINDINGS Brain: No evidence of acute infarction, hemorrhage, hydrocephalus, extra-axial collection or mass lesion/mass effect. Interval very small chronic infarction within the right cerebellar hemisphere. Stable nonspecific white matter hypodensities compatible with chronic microvascular ischemic changes and stable volume loss of the brain. Vascular: Calcific atherosclerosis of carotid siphons. No hyperdense vessel identified. Skull: Small left occipital region scalp contusion. Sinuses/Orbits: Mild mucosal thickening within the anterior ethmoid air cells and the maxillary sinuses. Additional visible paranasal sinuses and the mastoid air cells  are normally aerated. Debris within the left external auditory canal, likely cerumen. Other: Bilateral intra-ocular lens replacement. CT CERVICAL SPINE FINDINGS Alignment: Stable C6-7 grade 1 anterolisthesis. Skull base and vertebrae: No acute fracture. No primary bone lesion or focal pathologic process. Soft tissues and spinal canal: No prevertebral fluid or swelling. No visible canal hematoma. Disc levels: Stable spondylosis of the cervical spine with multilevel disc and facet degenerative changes. Intervertebral disc space height is greatest at the C4-C6 levels. Uncovertebral and facet hypertrophy encroach on the neural foramen the bilateral C3-4, bilateral C4-5, bilateral C5-6 levels. No high-grade bony spinal canal stenosis. Upper chest: Negative. Other: Multiple subcentimeter thyroid nodules and small thyroid calcifications. Calcific atherosclerosis of the carotid siphons. IMPRESSION: CT head: 1. Small left occipital region scalp contusion. No calvarial fracture. 2. No acute intracranial abnormality. 3. Interval very small chronic infarction within the right cerebellar hemisphere. 4. Stable chronic microvascular ischemic changes and parenchymal volume loss of the brain. CT cervical spine: 1. No acute fracture or dislocation of the cervical spine. 2. Stable cervical spondylosis greatest at the C4-C6 levels. No high-grade bony canal stenosis. Electronically Signed   By: Kristine Garbe M.D.   On: 07/12/2018 20:16   Dg Knee Complete 4 Views Left  Result Date: 07/17/2018 CLINICAL DATA:  85 y/o F; fall with knee injury. Tender below the knee. EXAM: DG HIP (WITH OR WITHOUT PELVIS) 1V*L*; LEFT KNEE - COMPLETE 4+ VIEW; LEFT TIBIA AND FIBULA - 2 VIEW COMPARISON:  None. FINDINGS: Pelvis and left hip: There is no evidence of hip fracture or dislocation. There is no evidence of arthropathy or other focal bone abnormality. Mesh repair anchors are present over the lower abdominal wall insert trickle sutures  project over mid pelvis. Left knee: No acute fracture or dislocation. Tricompartmental osteoarthrosis of the knee, mild and patellofemoral and medial femorotibial compartments, moderate to severe in the lateral femorotibial compartment. There is joint space narrowing and large periarticular osteophytes.  No joint effusion. Vascular calcifications noted. Left tibia and fibula: There is no evidence of hip fracture or dislocation. Ankle joint is maintained. Dorsal calcaneal enthesophyte. IMPRESSION: 1. No acute fracture or dislocation identified. 2. Tricompartmental osteoarthrosis of the knee greatest in the lateral femorotibial compartment. Electronically Signed   By: Kristine Garbe M.D.   On: 07/17/2018 03:48   Dg Hip Unilat W Or Wo Pelvis 1 View Left  Result Date: 07/17/2018 CLINICAL DATA:  85 y/o F; fall with knee injury. Tender below the knee. EXAM: DG HIP (WITH OR WITHOUT PELVIS) 1V*L*; LEFT KNEE - COMPLETE 4+ VIEW; LEFT TIBIA AND FIBULA - 2 VIEW COMPARISON:  None. FINDINGS: Pelvis and left hip: There is no evidence of hip fracture or dislocation. There is no evidence of arthropathy or other focal bone abnormality. Mesh repair anchors are present over the lower abdominal wall insert trickle sutures project over mid pelvis. Left knee: No acute fracture or dislocation. Tricompartmental osteoarthrosis of the knee, mild and patellofemoral and medial femorotibial compartments, moderate to severe in the lateral femorotibial compartment. There is joint space narrowing and large periarticular osteophytes. No joint effusion. Vascular calcifications noted. Left tibia and fibula: There is no evidence of hip fracture or dislocation. Ankle joint is maintained. Dorsal calcaneal enthesophyte. IMPRESSION: 1. No acute fracture or dislocation identified. 2. Tricompartmental osteoarthrosis of the knee greatest in the lateral femorotibial compartment. Electronically Signed   By: Kristine Garbe M.D.   On:  07/17/2018 03:48

## 2018-07-17 NOTE — ED Provider Notes (Signed)
[facility] EMERGENCY DEPARTMENT Provider Note   CSN: [id number] Arrival date & time: 07/17/18  0231    History   Chief Complaint Chief Complaint  Patient presents with  . Fall    HPI Kara Ortega is a 85 y.o. female.     Patient to ED after mechanical fall while transferring from walker to toilet tonight. Per EMS, it appears her walker got caught on the raised lip of her shower causing her to fall forward onto her left knee. She denies hitting her head. No chest, abdominal or neck pain. She denies nausea or right LE pain or injury. She is anticoagulated with Eliquis. No wound or bleeding.   The history is provided by the patient. No language interpreter was used.  Fall  Pertinent negatives include no chest pain, no abdominal pain, no headaches and no shortness of breath.    Past Medical History:  Diagnosis Date  . A-fib ([place])   . Abnormal heart rhythms   . Alzheimer disease ([place])   . Anginal pain ([employer])   . Back pain   . CHF (congestive heart failure) ([place])   . Chronic kidney disease    STAGE 3   . COPD (chronic obstructive pulmonary disease) ([place])   . Depression   . Diabetes ([place])    insulin dependent  . Hypertension   . Leg pain   . Memory loss   . Neuropathy   . Shortness of breath   . Sleep apnea    uses bipap X12 years.  . Stroke [facility])    LEFT SIDE RESIDUAL    Patient Active Problem List   Diagnosis Date Noted  . Essential tremor 09/14/2014  . High cholesterol 05/19/2014  . Persistent atrial fibrillation 05/19/2014  . Long-term use of high-risk medication 05/19/2014  . Accident due to mechanical fall without injury 03/24/2014  . Diastolic dysfunction with chronic heart failure ([place]) 03/24/2014  . Sinus bradycardia 03/24/2014  . Hypotension 03/24/2014  . OSA (obstructive sleep apnea) 03/24/2014  . Warfarin anticoagulation 03/24/2014  . Aortic stenosis 03/24/2014  . Mitral stenosis 03/24/2014  . DOE (dyspnea on exertion)  03/24/2014  . Recurrent falls 03/24/2014  . AKI (acute kidney injury) ([place]) 03/23/2014  . Alzheimer's dementia ([place]) 12/26/2013  . Unstable angina ([place]) 09/14/2013  . Diarrhea 09/14/2013  . Chest pain at rest- most likely GI related 09/14/2013  . CHF (congestive heart failure) ([place])   . A-fib ([place])   . Diabetes ([place])   . Dyspnea on exertion 03/25/2013    Past Surgical History:  Procedure Laterality Date  . BREAST LUMPECTOMY    . BREAST SURGERY     X4  . COLON SURGERY     removed 14 inches of colon  . HERNIA REPAIR       OB History   No obstetric history on file.      Home Medications    Prior to Admission medications   Medication Sig Start Date End Date Taking? Authorizing Provider  acetaminophen (TYLENOL) 325 MG tablet Take 650 mg by mouth every 6 (six) hours as needed.    [provider]  allopurinol (ZYLOPRIM) 100 MG tablet Take 100 mg by mouth 2 (two) times daily.    [provider]  ALPRAZolam Duanne Moron) 0.25 MG tablet Take 0.25 mg by mouth at bedtime.     [provider]  apixaban (ELIQUIS) 5 MG TABS tablet Take 1 tablet (5 mg total) by mouth 2 (two) times daily. 02/19/15  Isaiah Serge, NP  Ascorbic Acid (VITAMIN C) 1000 MG tablet Take 1,000 mg by mouth daily.    [provider]  atorvastatin (LIPITOR) 10 MG tablet Take 10 mg by mouth at bedtime.     [provider]  b complex vitamins tablet Take 1 tablet by mouth daily.    [provider]  B-D UF III MINI PEN NEEDLES 31G X 5 MM MISC U UTD TO INJECT INSULIN 02/25/15   [provider]  Cholecalciferol (HM VITAMIN D3) 4000 UNITS CAPS Take 1,000 Units by mouth daily.     [provider]  citalopram (CELEXA) 20 MG tablet Take 20 mg by mouth daily.    [provider]  COENZYME Q-10 PO Take 1 tablet by mouth.    [provider]  diclofenac sodium (VOLTAREN) 1 % GEL Apply 4 g topically 4 (four) times daily as needed (For pain.).      [provider]  diltiazem (DILACOR XR) 180 MG 24 hr capsule Take 1 capsule (180 mg total) by mouth daily. 04/22/17   Isaiah Serge, NP  docusate sodium (COLACE) 100 MG capsule Take 100 mg by mouth daily as needed for mild constipation.    [provider]  febuxostat (ULORIC) 40 MG tablet Take 40 mg by mouth daily.    [provider]  fexofenadine (ALLEGRA) 180 MG tablet Take 180 mg by mouth daily.    [provider]  furosemide (LASIX) 20 MG tablet Take 20 mg by mouth daily.     [provider]  hydrocortisone (ANUSOL-HC) 25 MG suppository Place 25 mg rectally 2 (two) times daily.    [provider]  insulin lispro (HUMALOG) 100 UNIT/ML injection Inject into the skin 3 (three) times daily before meals. Injects 16 units    [provider]  isosorbide mononitrate (IMDUR) 60 MG 24 hr tablet Take 1 tablet (60 mg total) by mouth daily. 04/27/17   Isaiah Serge, NP  Liraglutide (VICTOZA) 18 MG/3ML SOPN Inject 1.2 mg into the skin daily.     [provider]  loperamide (IMODIUM A-D) 2 MG tablet Take 2 mg by mouth 4 (four) times daily as needed for diarrhea or loose stools.    [provider]  Melatonin 5 MG TABS Take 10 mg by mouth at bedtime.    [provider]  methocarbamol (ROBAXIN) 500 MG tablet Take 500 mg by mouth 4 (four) times daily.    [provider]  Multiple Vitamins-Minerals (MULTIVITAMIN WITH MINERALS) tablet Take 1 tablet by mouth daily.    [provider]  nitroGLYCERIN (NITROSTAT) 0.4 MG SL tablet Place 1 tablet (0.4 mg total) under the tongue every 5 (five) minutes as needed for chest pain. 05/19/14   Jerline Pain, MD  pregabalin (LYRICA) 100 MG capsule Take 1 capsule (100 mg total) by mouth 2 (two) times daily. 07/25/16   Orpah Greek, MD  Rivastigmine 13.3 MG/24HR PT24 Place 1 patch (13.3 mg total) onto the skin daily. 04/27/15   Pieter Partridge, DO  spironolactone  (ALDACTONE) 25 MG tablet Take 25 mg by mouth 2 (two) times daily.     [provider]  tiZANidine (ZANAFLEX) 2 MG tablet Take 2 mg by mouth every 6 (six) hours as needed for muscle spasms.    [provider]  TOUJEO SOLOSTAR 300 UNIT/ML SOPN Inject 80 mg into the skin at bedtime.  05/22/14   [provider]  traMADol Veatrice Bourbon) 50  MG tablet Take 50 mg by mouth every 6 (six) hours as needed.    [provider]  vitamin B-12 (CYANOCOBALAMIN) 1000 MCG tablet Take 1,000 mcg by mouth daily.    [provider]    Family History Family History  Problem Relation Age of Onset  . Dementia Maternal Grandmother   . Heart disease Mother   . Heart disease Sister   . Alzheimer's disease Sister   . Dementia Maternal Aunt   . Mental illness Other     Social History Social History   Tobacco Use  . Smoking status: Former Smoker    Packs/day: 1.50    Years: 18.00    Pack years: 27.00    Types: Cigarettes    Last attempt to quit: 03/31/1968    Years since quitting: 50.3  . Smokeless tobacco: Never Used  Substance Use Topics  . Alcohol use: No    Alcohol/week: 0.0 standard drinks  . Drug use: No     Allergies   Avandia [rosiglitazone]; Benazepril; Dilaudid [hydromorphone hcl]; Fosamax [alendronate sodium]; Talwin [pentazocine]; Tetanus toxoids; and Zyrtec [cetirizine]   Review of Systems Review of Systems  Constitutional: Negative for fever.  Respiratory: Negative for shortness of breath.   Cardiovascular: Negative for chest pain.  Gastrointestinal: Negative for abdominal pain, nausea and vomiting.  Musculoskeletal: Negative for neck pain.       See HPI.  Skin: Negative for wound.  Neurological: Negative for headaches.     Physical Exam Updated Vital Signs BP 112/69   Pulse (!) 59   Temp 97.7 F (36.5 C) (Oral)   Ht 5' (1.524 m)   Wt 96.2 kg   SpO2 93%   BMI 41.40 kg/m   Physical Exam Constitutional:      Appearance: She is  well-developed.  HENT:     Head: Normocephalic.  Neck:     Musculoskeletal: Normal range of motion and neck supple.  Cardiovascular:     Rate and Rhythm: Normal rate.  Pulmonary:     Effort: Pulmonary effort is normal.  Chest:     Chest wall: No tenderness.  Abdominal:     General: Bowel sounds are normal.     Palpations: Abdomen is soft.     Tenderness: There is no abdominal tenderness. There is no guarding or rebound.  Musculoskeletal: Normal range of motion.     Comments: No midline spinal tenderness. UE's have FROM without pain or limitation  Skin:    General: Skin is warm and dry.     Findings: No rash.  Neurological:     Mental Status: She is alert.     Cranial Nerves: No cranial nerve deficit.      ED Treatments / Results  Labs (all labs ordered are listed, but only abnormal results are displayed) Labs Reviewed - No data to display  EKG None  Radiology No results found.  Procedures Procedures (including critical care time)  Medications Ordered in ED Medications - No data to display   Initial Impression / Assessment and Plan / ED Course  I have reviewed the triage vital signs and the nursing notes.  Pertinent labs & imaging results that were available during my care of the patient were reviewed by me and considered in my medical decision making (see chart for details).        Patient to ED after mechanical fall injuring left knee. No head injury, neck/chest/abdominal pain. C/o left knee pain only.   The patient is awake, alert,  able to provide history of mechanical fall with injury to left knee. She has left hip tenderness on exam. All imaging is negative for fracture.   She is in an assisted living residence. Doubt she will be able to ambulate safely for several days given degree of tenderness to contused knee. Will recommend wheelchair use until she can transition back to a walker.     Final Clinical Impressions(s) / ED Diagnoses   Final  diagnoses:  None   1. Mechanical fall 2. Contusion left knee.  3. Coagulopathy  ED Discharge Orders    None       Charlann Lange, PA-C 07/17/18 0358    Ripley Fraise, MD 07/17/18 [phone]

## 2018-07-17 NOTE — ED Notes (Signed)
RN attempted to call report to [facility] however was told by facility that I would need to call RN number however when that was done I received a voice mail.  RN recalled facility and they asked RN to call back in 5 minutes.

## 2018-07-17 NOTE — ED Triage Notes (Signed)
Pt reports fall due to a slip. Pt hit knee, no obvious deformity. Pt tender just below knee. Pt also fell a week ago and hit head, Slight tenderness and healing scab on the back of her head. Denies LOC, dizziness cgb 213. Pain 6/10. sats 95% rA

## 2018-07-17 NOTE — ED Notes (Signed)
Called ptar for pt

## 2018-07-21 DIAGNOSIS — S8002XD Contusion of left knee, subsequent encounter: Secondary | ICD-10-CM | POA: Diagnosis not present

## 2018-07-21 DIAGNOSIS — Z7901 Long term (current) use of anticoagulants: Secondary | ICD-10-CM | POA: Diagnosis not present

## 2018-07-21 DIAGNOSIS — G309 Alzheimer's disease, unspecified: Secondary | ICD-10-CM | POA: Diagnosis not present

## 2018-07-21 DIAGNOSIS — I4821 Permanent atrial fibrillation: Secondary | ICD-10-CM | POA: Diagnosis not present

## 2018-07-21 DIAGNOSIS — M545 Low back pain: Secondary | ICD-10-CM | POA: Diagnosis not present

## 2018-07-21 DIAGNOSIS — I69354 Hemiplegia and hemiparesis following cerebral infarction affecting left non-dominant side: Secondary | ICD-10-CM | POA: Diagnosis not present

## 2018-07-21 DIAGNOSIS — G8911 Acute pain due to trauma: Secondary | ICD-10-CM | POA: Diagnosis not present

## 2018-07-21 DIAGNOSIS — J449 Chronic obstructive pulmonary disease, unspecified: Secondary | ICD-10-CM | POA: Diagnosis not present

## 2018-07-21 DIAGNOSIS — F329 Major depressive disorder, single episode, unspecified: Secondary | ICD-10-CM | POA: Diagnosis not present

## 2018-07-21 DIAGNOSIS — N183 Chronic kidney disease, stage 3 (moderate): Secondary | ICD-10-CM | POA: Diagnosis not present

## 2018-07-21 DIAGNOSIS — I5032 Chronic diastolic (congestive) heart failure: Secondary | ICD-10-CM | POA: Diagnosis not present

## 2018-07-21 DIAGNOSIS — E1142 Type 2 diabetes mellitus with diabetic polyneuropathy: Secondary | ICD-10-CM | POA: Diagnosis not present

## 2018-07-21 DIAGNOSIS — F0280 Dementia in other diseases classified elsewhere without behavioral disturbance: Secondary | ICD-10-CM | POA: Diagnosis not present

## 2018-07-21 DIAGNOSIS — W19XXXD Unspecified fall, subsequent encounter: Secondary | ICD-10-CM | POA: Diagnosis not present

## 2018-07-21 DIAGNOSIS — E1122 Type 2 diabetes mellitus with diabetic chronic kidney disease: Secondary | ICD-10-CM | POA: Diagnosis not present

## 2018-07-21 DIAGNOSIS — I130 Hypertensive heart and chronic kidney disease with heart failure and stage 1 through stage 4 chronic kidney disease, or unspecified chronic kidney disease: Secondary | ICD-10-CM | POA: Diagnosis not present

## 2018-07-22 DIAGNOSIS — G250 Essential tremor: Secondary | ICD-10-CM | POA: Diagnosis not present

## 2018-07-22 DIAGNOSIS — J449 Chronic obstructive pulmonary disease, unspecified: Secondary | ICD-10-CM | POA: Diagnosis not present

## 2018-07-22 DIAGNOSIS — R296 Repeated falls: Secondary | ICD-10-CM | POA: Diagnosis not present

## 2018-07-23 DIAGNOSIS — F329 Major depressive disorder, single episode, unspecified: Secondary | ICD-10-CM | POA: Diagnosis not present

## 2018-07-23 DIAGNOSIS — W19XXXD Unspecified fall, subsequent encounter: Secondary | ICD-10-CM | POA: Diagnosis not present

## 2018-07-23 DIAGNOSIS — S8002XD Contusion of left knee, subsequent encounter: Secondary | ICD-10-CM | POA: Diagnosis not present

## 2018-07-23 DIAGNOSIS — G309 Alzheimer's disease, unspecified: Secondary | ICD-10-CM | POA: Diagnosis not present

## 2018-07-23 DIAGNOSIS — N183 Chronic kidney disease, stage 3 (moderate): Secondary | ICD-10-CM | POA: Diagnosis not present

## 2018-07-23 DIAGNOSIS — Z7901 Long term (current) use of anticoagulants: Secondary | ICD-10-CM | POA: Diagnosis not present

## 2018-07-23 DIAGNOSIS — E1142 Type 2 diabetes mellitus with diabetic polyneuropathy: Secondary | ICD-10-CM | POA: Diagnosis not present

## 2018-07-23 DIAGNOSIS — I5032 Chronic diastolic (congestive) heart failure: Secondary | ICD-10-CM | POA: Diagnosis not present

## 2018-07-23 DIAGNOSIS — I130 Hypertensive heart and chronic kidney disease with heart failure and stage 1 through stage 4 chronic kidney disease, or unspecified chronic kidney disease: Secondary | ICD-10-CM | POA: Diagnosis not present

## 2018-07-23 DIAGNOSIS — G8911 Acute pain due to trauma: Secondary | ICD-10-CM | POA: Diagnosis not present

## 2018-07-23 DIAGNOSIS — F0280 Dementia in other diseases classified elsewhere without behavioral disturbance: Secondary | ICD-10-CM | POA: Diagnosis not present

## 2018-07-23 DIAGNOSIS — I4821 Permanent atrial fibrillation: Secondary | ICD-10-CM | POA: Diagnosis not present

## 2018-07-23 DIAGNOSIS — E1122 Type 2 diabetes mellitus with diabetic chronic kidney disease: Secondary | ICD-10-CM | POA: Diagnosis not present

## 2018-07-23 DIAGNOSIS — J449 Chronic obstructive pulmonary disease, unspecified: Secondary | ICD-10-CM | POA: Diagnosis not present

## 2018-07-23 DIAGNOSIS — M545 Low back pain: Secondary | ICD-10-CM | POA: Diagnosis not present

## 2018-07-23 DIAGNOSIS — I69354 Hemiplegia and hemiparesis following cerebral infarction affecting left non-dominant side: Secondary | ICD-10-CM | POA: Diagnosis not present

## 2018-07-26 DIAGNOSIS — G309 Alzheimer's disease, unspecified: Secondary | ICD-10-CM | POA: Diagnosis not present

## 2018-07-26 DIAGNOSIS — I4821 Permanent atrial fibrillation: Secondary | ICD-10-CM | POA: Diagnosis not present

## 2018-07-26 DIAGNOSIS — Z7901 Long term (current) use of anticoagulants: Secondary | ICD-10-CM | POA: Diagnosis not present

## 2018-07-26 DIAGNOSIS — N183 Chronic kidney disease, stage 3 (moderate): Secondary | ICD-10-CM | POA: Diagnosis not present

## 2018-07-26 DIAGNOSIS — S8002XD Contusion of left knee, subsequent encounter: Secondary | ICD-10-CM | POA: Diagnosis not present

## 2018-07-26 DIAGNOSIS — J449 Chronic obstructive pulmonary disease, unspecified: Secondary | ICD-10-CM | POA: Diagnosis not present

## 2018-07-26 DIAGNOSIS — I130 Hypertensive heart and chronic kidney disease with heart failure and stage 1 through stage 4 chronic kidney disease, or unspecified chronic kidney disease: Secondary | ICD-10-CM | POA: Diagnosis not present

## 2018-07-26 DIAGNOSIS — I69354 Hemiplegia and hemiparesis following cerebral infarction affecting left non-dominant side: Secondary | ICD-10-CM | POA: Diagnosis not present

## 2018-07-26 DIAGNOSIS — G8911 Acute pain due to trauma: Secondary | ICD-10-CM | POA: Diagnosis not present

## 2018-07-26 DIAGNOSIS — W19XXXD Unspecified fall, subsequent encounter: Secondary | ICD-10-CM | POA: Diagnosis not present

## 2018-07-26 DIAGNOSIS — E1122 Type 2 diabetes mellitus with diabetic chronic kidney disease: Secondary | ICD-10-CM | POA: Diagnosis not present

## 2018-07-26 DIAGNOSIS — F0280 Dementia in other diseases classified elsewhere without behavioral disturbance: Secondary | ICD-10-CM | POA: Diagnosis not present

## 2018-07-26 DIAGNOSIS — I5032 Chronic diastolic (congestive) heart failure: Secondary | ICD-10-CM | POA: Diagnosis not present

## 2018-07-26 DIAGNOSIS — F329 Major depressive disorder, single episode, unspecified: Secondary | ICD-10-CM | POA: Diagnosis not present

## 2018-07-26 DIAGNOSIS — E1142 Type 2 diabetes mellitus with diabetic polyneuropathy: Secondary | ICD-10-CM | POA: Diagnosis not present

## 2018-07-26 DIAGNOSIS — M545 Low back pain: Secondary | ICD-10-CM | POA: Diagnosis not present

## 2018-07-28 DIAGNOSIS — I130 Hypertensive heart and chronic kidney disease with heart failure and stage 1 through stage 4 chronic kidney disease, or unspecified chronic kidney disease: Secondary | ICD-10-CM | POA: Diagnosis not present

## 2018-07-28 DIAGNOSIS — N183 Chronic kidney disease, stage 3 (moderate): Secondary | ICD-10-CM | POA: Diagnosis not present

## 2018-07-28 DIAGNOSIS — S8002XD Contusion of left knee, subsequent encounter: Secondary | ICD-10-CM | POA: Diagnosis not present

## 2018-07-28 DIAGNOSIS — F329 Major depressive disorder, single episode, unspecified: Secondary | ICD-10-CM | POA: Diagnosis not present

## 2018-07-28 DIAGNOSIS — E1142 Type 2 diabetes mellitus with diabetic polyneuropathy: Secondary | ICD-10-CM | POA: Diagnosis not present

## 2018-07-28 DIAGNOSIS — E1122 Type 2 diabetes mellitus with diabetic chronic kidney disease: Secondary | ICD-10-CM | POA: Diagnosis not present

## 2018-07-28 DIAGNOSIS — F0280 Dementia in other diseases classified elsewhere without behavioral disturbance: Secondary | ICD-10-CM | POA: Diagnosis not present

## 2018-07-28 DIAGNOSIS — I69354 Hemiplegia and hemiparesis following cerebral infarction affecting left non-dominant side: Secondary | ICD-10-CM | POA: Diagnosis not present

## 2018-07-28 DIAGNOSIS — I4821 Permanent atrial fibrillation: Secondary | ICD-10-CM | POA: Diagnosis not present

## 2018-07-28 DIAGNOSIS — J449 Chronic obstructive pulmonary disease, unspecified: Secondary | ICD-10-CM | POA: Diagnosis not present

## 2018-07-28 DIAGNOSIS — W19XXXD Unspecified fall, subsequent encounter: Secondary | ICD-10-CM | POA: Diagnosis not present

## 2018-07-28 DIAGNOSIS — G8911 Acute pain due to trauma: Secondary | ICD-10-CM | POA: Diagnosis not present

## 2018-07-28 DIAGNOSIS — Z7901 Long term (current) use of anticoagulants: Secondary | ICD-10-CM | POA: Diagnosis not present

## 2018-07-28 DIAGNOSIS — I5032 Chronic diastolic (congestive) heart failure: Secondary | ICD-10-CM | POA: Diagnosis not present

## 2018-07-28 DIAGNOSIS — G309 Alzheimer's disease, unspecified: Secondary | ICD-10-CM | POA: Diagnosis not present

## 2018-07-28 DIAGNOSIS — M545 Low back pain: Secondary | ICD-10-CM | POA: Diagnosis not present

## 2018-07-29 DIAGNOSIS — I69354 Hemiplegia and hemiparesis following cerebral infarction affecting left non-dominant side: Secondary | ICD-10-CM | POA: Diagnosis not present

## 2018-07-29 DIAGNOSIS — F0280 Dementia in other diseases classified elsewhere without behavioral disturbance: Secondary | ICD-10-CM | POA: Diagnosis not present

## 2018-07-29 DIAGNOSIS — I5032 Chronic diastolic (congestive) heart failure: Secondary | ICD-10-CM | POA: Diagnosis not present

## 2018-07-29 DIAGNOSIS — F329 Major depressive disorder, single episode, unspecified: Secondary | ICD-10-CM | POA: Diagnosis not present

## 2018-07-29 DIAGNOSIS — J449 Chronic obstructive pulmonary disease, unspecified: Secondary | ICD-10-CM | POA: Diagnosis not present

## 2018-07-29 DIAGNOSIS — I4821 Permanent atrial fibrillation: Secondary | ICD-10-CM | POA: Diagnosis not present

## 2018-07-29 DIAGNOSIS — S8002XD Contusion of left knee, subsequent encounter: Secondary | ICD-10-CM | POA: Diagnosis not present

## 2018-07-29 DIAGNOSIS — W19XXXD Unspecified fall, subsequent encounter: Secondary | ICD-10-CM | POA: Diagnosis not present

## 2018-07-29 DIAGNOSIS — N183 Chronic kidney disease, stage 3 (moderate): Secondary | ICD-10-CM | POA: Diagnosis not present

## 2018-07-29 DIAGNOSIS — M545 Low back pain: Secondary | ICD-10-CM | POA: Diagnosis not present

## 2018-07-29 DIAGNOSIS — G8911 Acute pain due to trauma: Secondary | ICD-10-CM | POA: Diagnosis not present

## 2018-07-29 DIAGNOSIS — I130 Hypertensive heart and chronic kidney disease with heart failure and stage 1 through stage 4 chronic kidney disease, or unspecified chronic kidney disease: Secondary | ICD-10-CM | POA: Diagnosis not present

## 2018-07-29 DIAGNOSIS — E1122 Type 2 diabetes mellitus with diabetic chronic kidney disease: Secondary | ICD-10-CM | POA: Diagnosis not present

## 2018-07-29 DIAGNOSIS — G309 Alzheimer's disease, unspecified: Secondary | ICD-10-CM | POA: Diagnosis not present

## 2018-07-29 DIAGNOSIS — Z7901 Long term (current) use of anticoagulants: Secondary | ICD-10-CM | POA: Diagnosis not present

## 2018-07-29 DIAGNOSIS — E1142 Type 2 diabetes mellitus with diabetic polyneuropathy: Secondary | ICD-10-CM | POA: Diagnosis not present

## 2018-07-30 DIAGNOSIS — F0280 Dementia in other diseases classified elsewhere without behavioral disturbance: Secondary | ICD-10-CM | POA: Diagnosis not present

## 2018-07-30 DIAGNOSIS — S8002XD Contusion of left knee, subsequent encounter: Secondary | ICD-10-CM | POA: Diagnosis not present

## 2018-07-30 DIAGNOSIS — I130 Hypertensive heart and chronic kidney disease with heart failure and stage 1 through stage 4 chronic kidney disease, or unspecified chronic kidney disease: Secondary | ICD-10-CM | POA: Diagnosis not present

## 2018-07-30 DIAGNOSIS — I69354 Hemiplegia and hemiparesis following cerebral infarction affecting left non-dominant side: Secondary | ICD-10-CM | POA: Diagnosis not present

## 2018-07-30 DIAGNOSIS — M545 Low back pain: Secondary | ICD-10-CM | POA: Diagnosis not present

## 2018-07-30 DIAGNOSIS — I5032 Chronic diastolic (congestive) heart failure: Secondary | ICD-10-CM | POA: Diagnosis not present

## 2018-07-30 DIAGNOSIS — G8911 Acute pain due to trauma: Secondary | ICD-10-CM | POA: Diagnosis not present

## 2018-07-30 DIAGNOSIS — W19XXXD Unspecified fall, subsequent encounter: Secondary | ICD-10-CM | POA: Diagnosis not present

## 2018-07-30 DIAGNOSIS — E1142 Type 2 diabetes mellitus with diabetic polyneuropathy: Secondary | ICD-10-CM | POA: Diagnosis not present

## 2018-07-30 DIAGNOSIS — G309 Alzheimer's disease, unspecified: Secondary | ICD-10-CM | POA: Diagnosis not present

## 2018-07-31 DIAGNOSIS — E1142 Type 2 diabetes mellitus with diabetic polyneuropathy: Secondary | ICD-10-CM | POA: Diagnosis not present

## 2018-08-02 DIAGNOSIS — E1142 Type 2 diabetes mellitus with diabetic polyneuropathy: Secondary | ICD-10-CM | POA: Diagnosis not present

## 2018-08-02 DIAGNOSIS — S8002XD Contusion of left knee, subsequent encounter: Secondary | ICD-10-CM | POA: Diagnosis not present

## 2018-08-02 DIAGNOSIS — I5032 Chronic diastolic (congestive) heart failure: Secondary | ICD-10-CM | POA: Diagnosis not present

## 2018-08-02 DIAGNOSIS — I69354 Hemiplegia and hemiparesis following cerebral infarction affecting left non-dominant side: Secondary | ICD-10-CM | POA: Diagnosis not present

## 2018-08-02 DIAGNOSIS — G8911 Acute pain due to trauma: Secondary | ICD-10-CM | POA: Diagnosis not present

## 2018-08-02 DIAGNOSIS — F0280 Dementia in other diseases classified elsewhere without behavioral disturbance: Secondary | ICD-10-CM | POA: Diagnosis not present

## 2018-08-02 DIAGNOSIS — W19XXXD Unspecified fall, subsequent encounter: Secondary | ICD-10-CM | POA: Diagnosis not present

## 2018-08-02 DIAGNOSIS — G309 Alzheimer's disease, unspecified: Secondary | ICD-10-CM | POA: Diagnosis not present

## 2018-08-02 DIAGNOSIS — M545 Low back pain: Secondary | ICD-10-CM | POA: Diagnosis not present

## 2018-08-02 DIAGNOSIS — I130 Hypertensive heart and chronic kidney disease with heart failure and stage 1 through stage 4 chronic kidney disease, or unspecified chronic kidney disease: Secondary | ICD-10-CM | POA: Diagnosis not present

## 2018-08-03 DIAGNOSIS — G8911 Acute pain due to trauma: Secondary | ICD-10-CM | POA: Diagnosis not present

## 2018-08-03 DIAGNOSIS — M25562 Pain in left knee: Secondary | ICD-10-CM | POA: Diagnosis not present

## 2018-08-03 DIAGNOSIS — G309 Alzheimer's disease, unspecified: Secondary | ICD-10-CM | POA: Diagnosis not present

## 2018-08-03 DIAGNOSIS — I5032 Chronic diastolic (congestive) heart failure: Secondary | ICD-10-CM | POA: Diagnosis not present

## 2018-08-03 DIAGNOSIS — M79671 Pain in right foot: Secondary | ICD-10-CM | POA: Diagnosis not present

## 2018-08-03 DIAGNOSIS — E1142 Type 2 diabetes mellitus with diabetic polyneuropathy: Secondary | ICD-10-CM | POA: Diagnosis not present

## 2018-08-03 DIAGNOSIS — I130 Hypertensive heart and chronic kidney disease with heart failure and stage 1 through stage 4 chronic kidney disease, or unspecified chronic kidney disease: Secondary | ICD-10-CM | POA: Diagnosis not present

## 2018-08-03 DIAGNOSIS — F0280 Dementia in other diseases classified elsewhere without behavioral disturbance: Secondary | ICD-10-CM | POA: Diagnosis not present

## 2018-08-03 DIAGNOSIS — S8002XD Contusion of left knee, subsequent encounter: Secondary | ICD-10-CM | POA: Diagnosis not present

## 2018-08-03 DIAGNOSIS — W19XXXD Unspecified fall, subsequent encounter: Secondary | ICD-10-CM | POA: Diagnosis not present

## 2018-08-03 DIAGNOSIS — M545 Low back pain: Secondary | ICD-10-CM | POA: Diagnosis not present

## 2018-08-03 DIAGNOSIS — I69354 Hemiplegia and hemiparesis following cerebral infarction affecting left non-dominant side: Secondary | ICD-10-CM | POA: Diagnosis not present

## 2018-08-05 DIAGNOSIS — W19XXXD Unspecified fall, subsequent encounter: Secondary | ICD-10-CM | POA: Diagnosis not present

## 2018-08-05 DIAGNOSIS — E1142 Type 2 diabetes mellitus with diabetic polyneuropathy: Secondary | ICD-10-CM | POA: Diagnosis not present

## 2018-08-05 DIAGNOSIS — I130 Hypertensive heart and chronic kidney disease with heart failure and stage 1 through stage 4 chronic kidney disease, or unspecified chronic kidney disease: Secondary | ICD-10-CM | POA: Diagnosis not present

## 2018-08-05 DIAGNOSIS — I5032 Chronic diastolic (congestive) heart failure: Secondary | ICD-10-CM | POA: Diagnosis not present

## 2018-08-05 DIAGNOSIS — M545 Low back pain: Secondary | ICD-10-CM | POA: Diagnosis not present

## 2018-08-05 DIAGNOSIS — G8911 Acute pain due to trauma: Secondary | ICD-10-CM | POA: Diagnosis not present

## 2018-08-05 DIAGNOSIS — S8002XD Contusion of left knee, subsequent encounter: Secondary | ICD-10-CM | POA: Diagnosis not present

## 2018-08-05 DIAGNOSIS — F0280 Dementia in other diseases classified elsewhere without behavioral disturbance: Secondary | ICD-10-CM | POA: Diagnosis not present

## 2018-08-05 DIAGNOSIS — G309 Alzheimer's disease, unspecified: Secondary | ICD-10-CM | POA: Diagnosis not present

## 2018-08-05 DIAGNOSIS — I69354 Hemiplegia and hemiparesis following cerebral infarction affecting left non-dominant side: Secondary | ICD-10-CM | POA: Diagnosis not present

## 2018-08-06 DIAGNOSIS — G8911 Acute pain due to trauma: Secondary | ICD-10-CM | POA: Diagnosis not present

## 2018-08-06 DIAGNOSIS — F0280 Dementia in other diseases classified elsewhere without behavioral disturbance: Secondary | ICD-10-CM | POA: Diagnosis not present

## 2018-08-06 DIAGNOSIS — I130 Hypertensive heart and chronic kidney disease with heart failure and stage 1 through stage 4 chronic kidney disease, or unspecified chronic kidney disease: Secondary | ICD-10-CM | POA: Diagnosis not present

## 2018-08-06 DIAGNOSIS — I5032 Chronic diastolic (congestive) heart failure: Secondary | ICD-10-CM | POA: Diagnosis not present

## 2018-08-06 DIAGNOSIS — G309 Alzheimer's disease, unspecified: Secondary | ICD-10-CM | POA: Diagnosis not present

## 2018-08-06 DIAGNOSIS — E1142 Type 2 diabetes mellitus with diabetic polyneuropathy: Secondary | ICD-10-CM | POA: Diagnosis not present

## 2018-08-06 DIAGNOSIS — W19XXXD Unspecified fall, subsequent encounter: Secondary | ICD-10-CM | POA: Diagnosis not present

## 2018-08-06 DIAGNOSIS — I69354 Hemiplegia and hemiparesis following cerebral infarction affecting left non-dominant side: Secondary | ICD-10-CM | POA: Diagnosis not present

## 2018-08-06 DIAGNOSIS — M545 Low back pain: Secondary | ICD-10-CM | POA: Diagnosis not present

## 2018-08-06 DIAGNOSIS — S8002XD Contusion of left knee, subsequent encounter: Secondary | ICD-10-CM | POA: Diagnosis not present

## 2018-08-09 DIAGNOSIS — I130 Hypertensive heart and chronic kidney disease with heart failure and stage 1 through stage 4 chronic kidney disease, or unspecified chronic kidney disease: Secondary | ICD-10-CM | POA: Diagnosis not present

## 2018-08-09 DIAGNOSIS — I5032 Chronic diastolic (congestive) heart failure: Secondary | ICD-10-CM | POA: Diagnosis not present

## 2018-08-09 DIAGNOSIS — M545 Low back pain: Secondary | ICD-10-CM | POA: Diagnosis not present

## 2018-08-09 DIAGNOSIS — E1142 Type 2 diabetes mellitus with diabetic polyneuropathy: Secondary | ICD-10-CM | POA: Diagnosis not present

## 2018-08-09 DIAGNOSIS — G8911 Acute pain due to trauma: Secondary | ICD-10-CM | POA: Diagnosis not present

## 2018-08-09 DIAGNOSIS — S8002XD Contusion of left knee, subsequent encounter: Secondary | ICD-10-CM | POA: Diagnosis not present

## 2018-08-09 DIAGNOSIS — F0280 Dementia in other diseases classified elsewhere without behavioral disturbance: Secondary | ICD-10-CM | POA: Diagnosis not present

## 2018-08-09 DIAGNOSIS — W19XXXD Unspecified fall, subsequent encounter: Secondary | ICD-10-CM | POA: Diagnosis not present

## 2018-08-09 DIAGNOSIS — G309 Alzheimer's disease, unspecified: Secondary | ICD-10-CM | POA: Diagnosis not present

## 2018-08-09 DIAGNOSIS — I69354 Hemiplegia and hemiparesis following cerebral infarction affecting left non-dominant side: Secondary | ICD-10-CM | POA: Diagnosis not present

## 2018-08-10 DIAGNOSIS — G8911 Acute pain due to trauma: Secondary | ICD-10-CM | POA: Diagnosis not present

## 2018-08-10 DIAGNOSIS — E1142 Type 2 diabetes mellitus with diabetic polyneuropathy: Secondary | ICD-10-CM | POA: Diagnosis not present

## 2018-08-10 DIAGNOSIS — F0280 Dementia in other diseases classified elsewhere without behavioral disturbance: Secondary | ICD-10-CM | POA: Diagnosis not present

## 2018-08-10 DIAGNOSIS — G309 Alzheimer's disease, unspecified: Secondary | ICD-10-CM | POA: Diagnosis not present

## 2018-08-10 DIAGNOSIS — I69354 Hemiplegia and hemiparesis following cerebral infarction affecting left non-dominant side: Secondary | ICD-10-CM | POA: Diagnosis not present

## 2018-08-10 DIAGNOSIS — I130 Hypertensive heart and chronic kidney disease with heart failure and stage 1 through stage 4 chronic kidney disease, or unspecified chronic kidney disease: Secondary | ICD-10-CM | POA: Diagnosis not present

## 2018-08-10 DIAGNOSIS — W19XXXD Unspecified fall, subsequent encounter: Secondary | ICD-10-CM | POA: Diagnosis not present

## 2018-08-10 DIAGNOSIS — I5032 Chronic diastolic (congestive) heart failure: Secondary | ICD-10-CM | POA: Diagnosis not present

## 2018-08-10 DIAGNOSIS — M545 Low back pain: Secondary | ICD-10-CM | POA: Diagnosis not present

## 2018-08-10 DIAGNOSIS — S8002XD Contusion of left knee, subsequent encounter: Secondary | ICD-10-CM | POA: Diagnosis not present

## 2018-08-12 DIAGNOSIS — I130 Hypertensive heart and chronic kidney disease with heart failure and stage 1 through stage 4 chronic kidney disease, or unspecified chronic kidney disease: Secondary | ICD-10-CM | POA: Diagnosis not present

## 2018-08-12 DIAGNOSIS — E1142 Type 2 diabetes mellitus with diabetic polyneuropathy: Secondary | ICD-10-CM | POA: Diagnosis not present

## 2018-08-12 DIAGNOSIS — G8911 Acute pain due to trauma: Secondary | ICD-10-CM | POA: Diagnosis not present

## 2018-08-12 DIAGNOSIS — S8002XD Contusion of left knee, subsequent encounter: Secondary | ICD-10-CM | POA: Diagnosis not present

## 2018-08-12 DIAGNOSIS — G309 Alzheimer's disease, unspecified: Secondary | ICD-10-CM | POA: Diagnosis not present

## 2018-08-12 DIAGNOSIS — I5032 Chronic diastolic (congestive) heart failure: Secondary | ICD-10-CM | POA: Diagnosis not present

## 2018-08-12 DIAGNOSIS — M545 Low back pain: Secondary | ICD-10-CM | POA: Diagnosis not present

## 2018-08-12 DIAGNOSIS — I69354 Hemiplegia and hemiparesis following cerebral infarction affecting left non-dominant side: Secondary | ICD-10-CM | POA: Diagnosis not present

## 2018-08-12 DIAGNOSIS — F0280 Dementia in other diseases classified elsewhere without behavioral disturbance: Secondary | ICD-10-CM | POA: Diagnosis not present

## 2018-08-12 DIAGNOSIS — W19XXXD Unspecified fall, subsequent encounter: Secondary | ICD-10-CM | POA: Diagnosis not present

## 2018-08-13 DIAGNOSIS — W19XXXD Unspecified fall, subsequent encounter: Secondary | ICD-10-CM | POA: Diagnosis not present

## 2018-08-13 DIAGNOSIS — G8911 Acute pain due to trauma: Secondary | ICD-10-CM | POA: Diagnosis not present

## 2018-08-13 DIAGNOSIS — E1142 Type 2 diabetes mellitus with diabetic polyneuropathy: Secondary | ICD-10-CM | POA: Diagnosis not present

## 2018-08-13 DIAGNOSIS — F0280 Dementia in other diseases classified elsewhere without behavioral disturbance: Secondary | ICD-10-CM | POA: Diagnosis not present

## 2018-08-13 DIAGNOSIS — S8002XD Contusion of left knee, subsequent encounter: Secondary | ICD-10-CM | POA: Diagnosis not present

## 2018-08-13 DIAGNOSIS — M545 Low back pain: Secondary | ICD-10-CM | POA: Diagnosis not present

## 2018-08-13 DIAGNOSIS — I130 Hypertensive heart and chronic kidney disease with heart failure and stage 1 through stage 4 chronic kidney disease, or unspecified chronic kidney disease: Secondary | ICD-10-CM | POA: Diagnosis not present

## 2018-08-13 DIAGNOSIS — I5032 Chronic diastolic (congestive) heart failure: Secondary | ICD-10-CM | POA: Diagnosis not present

## 2018-08-13 DIAGNOSIS — I69354 Hemiplegia and hemiparesis following cerebral infarction affecting left non-dominant side: Secondary | ICD-10-CM | POA: Diagnosis not present

## 2018-08-13 DIAGNOSIS — G309 Alzheimer's disease, unspecified: Secondary | ICD-10-CM | POA: Diagnosis not present

## 2018-08-16 DIAGNOSIS — I69354 Hemiplegia and hemiparesis following cerebral infarction affecting left non-dominant side: Secondary | ICD-10-CM | POA: Diagnosis not present

## 2018-08-16 DIAGNOSIS — I5032 Chronic diastolic (congestive) heart failure: Secondary | ICD-10-CM | POA: Diagnosis not present

## 2018-08-16 DIAGNOSIS — E1142 Type 2 diabetes mellitus with diabetic polyneuropathy: Secondary | ICD-10-CM | POA: Diagnosis not present

## 2018-08-16 DIAGNOSIS — S8002XD Contusion of left knee, subsequent encounter: Secondary | ICD-10-CM | POA: Diagnosis not present

## 2018-08-16 DIAGNOSIS — I130 Hypertensive heart and chronic kidney disease with heart failure and stage 1 through stage 4 chronic kidney disease, or unspecified chronic kidney disease: Secondary | ICD-10-CM | POA: Diagnosis not present

## 2018-08-16 DIAGNOSIS — G309 Alzheimer's disease, unspecified: Secondary | ICD-10-CM | POA: Diagnosis not present

## 2018-08-16 DIAGNOSIS — M545 Low back pain: Secondary | ICD-10-CM | POA: Diagnosis not present

## 2018-08-16 DIAGNOSIS — G8911 Acute pain due to trauma: Secondary | ICD-10-CM | POA: Diagnosis not present

## 2018-08-16 DIAGNOSIS — F0280 Dementia in other diseases classified elsewhere without behavioral disturbance: Secondary | ICD-10-CM | POA: Diagnosis not present

## 2018-08-16 DIAGNOSIS — W19XXXD Unspecified fall, subsequent encounter: Secondary | ICD-10-CM | POA: Diagnosis not present

## 2018-08-18 DIAGNOSIS — I5032 Chronic diastolic (congestive) heart failure: Secondary | ICD-10-CM | POA: Diagnosis not present

## 2018-08-18 DIAGNOSIS — I69354 Hemiplegia and hemiparesis following cerebral infarction affecting left non-dominant side: Secondary | ICD-10-CM | POA: Diagnosis not present

## 2018-08-18 DIAGNOSIS — G309 Alzheimer's disease, unspecified: Secondary | ICD-10-CM | POA: Diagnosis not present

## 2018-08-18 DIAGNOSIS — S8002XD Contusion of left knee, subsequent encounter: Secondary | ICD-10-CM | POA: Diagnosis not present

## 2018-08-18 DIAGNOSIS — E1142 Type 2 diabetes mellitus with diabetic polyneuropathy: Secondary | ICD-10-CM | POA: Diagnosis not present

## 2018-08-18 DIAGNOSIS — W19XXXD Unspecified fall, subsequent encounter: Secondary | ICD-10-CM | POA: Diagnosis not present

## 2018-08-18 DIAGNOSIS — I130 Hypertensive heart and chronic kidney disease with heart failure and stage 1 through stage 4 chronic kidney disease, or unspecified chronic kidney disease: Secondary | ICD-10-CM | POA: Diagnosis not present

## 2018-08-18 DIAGNOSIS — G8911 Acute pain due to trauma: Secondary | ICD-10-CM | POA: Diagnosis not present

## 2018-08-18 DIAGNOSIS — M545 Low back pain: Secondary | ICD-10-CM | POA: Diagnosis not present

## 2018-08-18 DIAGNOSIS — F0280 Dementia in other diseases classified elsewhere without behavioral disturbance: Secondary | ICD-10-CM | POA: Diagnosis not present

## 2018-08-19 DIAGNOSIS — G8911 Acute pain due to trauma: Secondary | ICD-10-CM | POA: Diagnosis not present

## 2018-08-19 DIAGNOSIS — S8002XD Contusion of left knee, subsequent encounter: Secondary | ICD-10-CM | POA: Diagnosis not present

## 2018-08-19 DIAGNOSIS — I130 Hypertensive heart and chronic kidney disease with heart failure and stage 1 through stage 4 chronic kidney disease, or unspecified chronic kidney disease: Secondary | ICD-10-CM | POA: Diagnosis not present

## 2018-08-19 DIAGNOSIS — I69354 Hemiplegia and hemiparesis following cerebral infarction affecting left non-dominant side: Secondary | ICD-10-CM | POA: Diagnosis not present

## 2018-08-19 DIAGNOSIS — M545 Low back pain: Secondary | ICD-10-CM | POA: Diagnosis not present

## 2018-08-19 DIAGNOSIS — G309 Alzheimer's disease, unspecified: Secondary | ICD-10-CM | POA: Diagnosis not present

## 2018-08-19 DIAGNOSIS — F0280 Dementia in other diseases classified elsewhere without behavioral disturbance: Secondary | ICD-10-CM | POA: Diagnosis not present

## 2018-08-19 DIAGNOSIS — W19XXXD Unspecified fall, subsequent encounter: Secondary | ICD-10-CM | POA: Diagnosis not present

## 2018-08-19 DIAGNOSIS — E1142 Type 2 diabetes mellitus with diabetic polyneuropathy: Secondary | ICD-10-CM | POA: Diagnosis not present

## 2018-08-19 DIAGNOSIS — I5032 Chronic diastolic (congestive) heart failure: Secondary | ICD-10-CM | POA: Diagnosis not present

## 2018-08-21 DIAGNOSIS — R296 Repeated falls: Secondary | ICD-10-CM | POA: Diagnosis not present

## 2018-08-21 DIAGNOSIS — G250 Essential tremor: Secondary | ICD-10-CM | POA: Diagnosis not present

## 2018-08-21 DIAGNOSIS — J449 Chronic obstructive pulmonary disease, unspecified: Secondary | ICD-10-CM | POA: Diagnosis not present

## 2018-08-24 DIAGNOSIS — M545 Low back pain: Secondary | ICD-10-CM | POA: Diagnosis not present

## 2018-08-24 DIAGNOSIS — G8911 Acute pain due to trauma: Secondary | ICD-10-CM | POA: Diagnosis not present

## 2018-08-24 DIAGNOSIS — I130 Hypertensive heart and chronic kidney disease with heart failure and stage 1 through stage 4 chronic kidney disease, or unspecified chronic kidney disease: Secondary | ICD-10-CM | POA: Diagnosis not present

## 2018-08-24 DIAGNOSIS — G309 Alzheimer's disease, unspecified: Secondary | ICD-10-CM | POA: Diagnosis not present

## 2018-08-24 DIAGNOSIS — I5032 Chronic diastolic (congestive) heart failure: Secondary | ICD-10-CM | POA: Diagnosis not present

## 2018-08-24 DIAGNOSIS — F0280 Dementia in other diseases classified elsewhere without behavioral disturbance: Secondary | ICD-10-CM | POA: Diagnosis not present

## 2018-08-24 DIAGNOSIS — I69354 Hemiplegia and hemiparesis following cerebral infarction affecting left non-dominant side: Secondary | ICD-10-CM | POA: Diagnosis not present

## 2018-08-24 DIAGNOSIS — E1142 Type 2 diabetes mellitus with diabetic polyneuropathy: Secondary | ICD-10-CM | POA: Diagnosis not present

## 2018-08-24 DIAGNOSIS — S8002XD Contusion of left knee, subsequent encounter: Secondary | ICD-10-CM | POA: Diagnosis not present

## 2018-08-24 DIAGNOSIS — W19XXXD Unspecified fall, subsequent encounter: Secondary | ICD-10-CM | POA: Diagnosis not present

## 2018-08-26 DIAGNOSIS — I5032 Chronic diastolic (congestive) heart failure: Secondary | ICD-10-CM | POA: Diagnosis not present

## 2018-08-26 DIAGNOSIS — I130 Hypertensive heart and chronic kidney disease with heart failure and stage 1 through stage 4 chronic kidney disease, or unspecified chronic kidney disease: Secondary | ICD-10-CM | POA: Diagnosis not present

## 2018-08-26 DIAGNOSIS — S8002XD Contusion of left knee, subsequent encounter: Secondary | ICD-10-CM | POA: Diagnosis not present

## 2018-08-26 DIAGNOSIS — M545 Low back pain: Secondary | ICD-10-CM | POA: Diagnosis not present

## 2018-08-26 DIAGNOSIS — I69354 Hemiplegia and hemiparesis following cerebral infarction affecting left non-dominant side: Secondary | ICD-10-CM | POA: Diagnosis not present

## 2018-08-26 DIAGNOSIS — G8911 Acute pain due to trauma: Secondary | ICD-10-CM | POA: Diagnosis not present

## 2018-08-26 DIAGNOSIS — E1142 Type 2 diabetes mellitus with diabetic polyneuropathy: Secondary | ICD-10-CM | POA: Diagnosis not present

## 2018-08-26 DIAGNOSIS — G309 Alzheimer's disease, unspecified: Secondary | ICD-10-CM | POA: Diagnosis not present

## 2018-08-26 DIAGNOSIS — W19XXXD Unspecified fall, subsequent encounter: Secondary | ICD-10-CM | POA: Diagnosis not present

## 2018-08-26 DIAGNOSIS — F0280 Dementia in other diseases classified elsewhere without behavioral disturbance: Secondary | ICD-10-CM | POA: Diagnosis not present

## 2018-08-29 ENCOUNTER — Other Ambulatory Visit: Payer: Self-pay

## 2018-08-29 ENCOUNTER — Encounter (HOSPITAL_COMMUNITY): Payer: Self-pay | Admitting: Emergency Medicine

## 2018-08-29 ENCOUNTER — Emergency Department (HOSPITAL_COMMUNITY): Payer: Medicare Other

## 2018-08-29 ENCOUNTER — Emergency Department (HOSPITAL_COMMUNITY)
Admission: EM | Admit: 2018-08-29 | Discharge: 2018-08-29 | Disposition: A | Discharge status: Home / Self Care | Payer: Medicare Other | Attending: Emergency Medicine | Admitting: Emergency Medicine

## 2018-08-29 DIAGNOSIS — E1122 Type 2 diabetes mellitus with diabetic chronic kidney disease: Secondary | ICD-10-CM | POA: Diagnosis not present

## 2018-08-29 DIAGNOSIS — Z87891 Personal history of nicotine dependence: Secondary | ICD-10-CM | POA: Diagnosis not present

## 2018-08-29 DIAGNOSIS — E1140 Type 2 diabetes mellitus with diabetic neuropathy, unspecified: Secondary | ICD-10-CM | POA: Diagnosis not present

## 2018-08-29 DIAGNOSIS — J449 Chronic obstructive pulmonary disease, unspecified: Secondary | ICD-10-CM | POA: Insufficient documentation

## 2018-08-29 DIAGNOSIS — Y998 Other external cause status: Secondary | ICD-10-CM | POA: Insufficient documentation

## 2018-08-29 DIAGNOSIS — S199XXA Unspecified injury of neck, initial encounter: Secondary | ICD-10-CM | POA: Diagnosis not present

## 2018-08-29 DIAGNOSIS — S0990XA Unspecified injury of head, initial encounter: Secondary | ICD-10-CM | POA: Diagnosis present

## 2018-08-29 DIAGNOSIS — G309 Alzheimer's disease, unspecified: Secondary | ICD-10-CM | POA: Diagnosis not present

## 2018-08-29 DIAGNOSIS — Z794 Long term (current) use of insulin: Secondary | ICD-10-CM | POA: Insufficient documentation

## 2018-08-29 DIAGNOSIS — E1165 Type 2 diabetes mellitus with hyperglycemia: Secondary | ICD-10-CM | POA: Diagnosis not present

## 2018-08-29 DIAGNOSIS — I130 Hypertensive heart and chronic kidney disease with heart failure and stage 1 through stage 4 chronic kidney disease, or unspecified chronic kidney disease: Secondary | ICD-10-CM | POA: Diagnosis not present

## 2018-08-29 DIAGNOSIS — Y9389 Activity, other specified: Secondary | ICD-10-CM | POA: Insufficient documentation

## 2018-08-29 DIAGNOSIS — Y92121 Bathroom in nursing home as the place of occurrence of the external cause: Secondary | ICD-10-CM | POA: Insufficient documentation

## 2018-08-29 DIAGNOSIS — Z79899 Other long term (current) drug therapy: Secondary | ICD-10-CM | POA: Insufficient documentation

## 2018-08-29 DIAGNOSIS — I509 Heart failure, unspecified: Secondary | ICD-10-CM | POA: Diagnosis not present

## 2018-08-29 DIAGNOSIS — W01198A Fall on same level from slipping, tripping and stumbling with subsequent striking against other object, initial encounter: Secondary | ICD-10-CM | POA: Insufficient documentation

## 2018-08-29 DIAGNOSIS — W19XXXA Unspecified fall, initial encounter: Secondary | ICD-10-CM | POA: Diagnosis not present

## 2018-08-29 DIAGNOSIS — S0003XA Contusion of scalp, initial encounter: Secondary | ICD-10-CM | POA: Diagnosis not present

## 2018-08-29 DIAGNOSIS — N183 Chronic kidney disease, stage 3 (moderate): Secondary | ICD-10-CM | POA: Insufficient documentation

## 2018-08-29 LAB — CBG MONITORING, ED: Glucose-Capillary: 237 mg/dL — ABNORMAL HIGH (ref 70–99)

## 2018-08-29 MED ORDER — TRAMADOL HCL 50 MG PO TABS
25.0000 mg | ORAL_TABLET | Freq: Once | ORAL | Status: AC
  Administered 2018-08-29: 14:00:00 25 mg via ORAL
  Filled 2018-08-29: qty 1

## 2018-08-29 NOTE — Care Management (Signed)
ED CM attempted to contact patient's son by phone via both numbers listed. CM contacted [facility] concerning patient being medically cleared and ready for transport awaiting a return call.

## 2018-08-29 NOTE — ED Notes (Signed)
P[employer] placed.

## 2018-08-29 NOTE — ED Notes (Signed)
Patient verbalizes understanding of discharge instructions. Opportunity for questioning and answers were provided. Armband removed by staff, pt discharged from ED. Son picked up pt to transport to facility.

## 2018-08-29 NOTE — Care Management (Addendum)
As per Nurse Tawanna Solo Patient's son was located and will come to the ED to pick patient up.

## 2018-08-29 NOTE — ED Provider Notes (Signed)
[facility] EMERGENCY DEPARTMENT Provider Note   CSN: [id number] Arrival date & time: 08/29/18  1239    History   Chief Complaint Chief Complaint  Patient presents with  . Fall    HPI Kara Ortega is a 85 y.o. female.     Patient is an 85 year old female with a history of dementia, CKD, CHF, COPD, diabetes, A. fib on Eliquis, stroke and neuropathy who lives at a memory care unit presenting today after a fall.  Patient and the unit report that she had just finished having a bowel movement and she had stood up as they were cleaning her and stepped away and she lost her balance falling backwards hitting her head on the lip of the shower.  She had no loss of consciousness and states she is just having discomfort in the back of her head.  She denies any neck pain, numbness or tingling in the arms or legs.  She states the pain is a sharp sensation in the back of her head made worse by touching it.  She has not tried anything to make it better but when EMS arrived they placed her in spine immobilization.  She denies any pain in her arms or legs and has not had any cough, fever, shortness of breath or other complaints.  The history is provided by the patient, the EMS personnel and the nursing home.    Past Medical History:  Diagnosis Date  . A-fib ([place])   . Abnormal heart rhythms   . Alzheimer disease ([place])   . Anginal pain ([place])   . Back pain   . CHF (congestive heart failure) ([place])   . Chronic kidney disease    STAGE 3   . COPD (chronic obstructive pulmonary disease) ([place])   . Depression   . Diabetes ([place])    insulin dependent  . Hypertension   . Leg pain   . Memory loss   . Neuropathy   . Shortness of breath   . Sleep apnea    uses bipap X12 years.  . Stroke [facility])    LEFT SIDE RESIDUAL    Patient Active Problem List   Diagnosis Date Noted  . Essential tremor 09/14/2014  . High cholesterol 05/19/2014  . Persistent atrial fibrillation 05/19/2014  .  Long-term use of high-risk medication 05/19/2014  . Accident due to mechanical fall without injury 03/24/2014  . Diastolic dysfunction with chronic heart failure ([place]) 03/24/2014  . Sinus bradycardia 03/24/2014  . Hypotension 03/24/2014  . OSA (obstructive sleep apnea) 03/24/2014  . Warfarin anticoagulation 03/24/2014  . Aortic stenosis 03/24/2014  . Mitral stenosis 03/24/2014  . DOE (dyspnea on exertion) 03/24/2014  . Recurrent falls 03/24/2014  . AKI (acute kidney injury) ([place]) 03/23/2014  . Alzheimer's dementia ([place]) 12/26/2013  . Unstable angina ([place]) 09/14/2013  . Diarrhea 09/14/2013  . Chest pain at rest- most likely GI related 09/14/2013  . CHF (congestive heart failure) ([place])   . A-fib ([place])   . Diabetes ([place])   . Dyspnea on exertion 03/25/2013    Past Surgical History:  Procedure Laterality Date  . BREAST LUMPECTOMY    . BREAST SURGERY     X4  . COLON SURGERY     removed 14 inches of colon  . HERNIA REPAIR       OB History   No obstetric history on file.      Home Medications    Prior to Admission medications   Medication Sig Start Date End  Date Taking? Authorizing Provider  allopurinol (ZYLOPRIM) 100 MG tablet Take 200 mg by mouth daily.    Yes [provider]  atorvastatin (LIPITOR) 10 MG tablet Take 10 mg by mouth at bedtime.    Yes [provider]  Cholecalciferol (HM VITAMIN D3) 4000 UNITS CAPS Take 1,000 Units by mouth daily.    Yes [provider]  acetaminophen (TYLENOL) 325 MG tablet Take 650 mg by mouth every 6 (six) hours as needed.    [provider]  ALPRAZolam Duanne Moron) 0.25 MG tablet Take 0.25 mg by mouth at bedtime.     [provider]  apixaban (ELIQUIS) 5 MG TABS tablet Take 1 tablet (5 mg total) by mouth 2 (two) times daily. 02/19/15   Isaiah Serge, NP  Ascorbic Acid (VITAMIN C) 1000 MG tablet Take 1,000 mg by mouth daily.    [provider]  b complex vitamins tablet Take 1 tablet by  mouth daily.    [provider]  B-D UF III MINI PEN NEEDLES 31G X 5 MM MISC U UTD TO INJECT INSULIN 02/25/15   [provider]  citalopram (CELEXA) 20 MG tablet Take 20 mg by mouth daily.    [provider]  COENZYME Q-10 PO Take 1 tablet by mouth.    [provider]  diclofenac sodium (VOLTAREN) 1 % GEL Apply 4 g topically 4 (four) times daily as needed (For pain.).     [provider]  diltiazem (DILACOR XR) 180 MG 24 hr capsule Take 1 capsule (180 mg total) by mouth daily. 04/22/17   Isaiah Serge, NP  docusate sodium (COLACE) 100 MG capsule Take 100 mg by mouth daily as needed for mild constipation.    [provider]  febuxostat (ULORIC) 40 MG tablet Take 40 mg by mouth daily.    [provider]  fexofenadine (ALLEGRA) 180 MG tablet Take 180 mg by mouth daily.    [provider]  furosemide (LASIX) 20 MG tablet Take 20 mg by mouth daily.     [provider]  hydrocortisone (ANUSOL-HC) 25 MG suppository Place 25 mg rectally 2 (two) times daily.    [provider]  insulin lispro (HUMALOG) 100 UNIT/ML injection Inject into the skin 3 (three) times daily before meals. Injects 16 units    [provider]  isosorbide mononitrate (IMDUR) 60 MG 24 hr tablet Take 1 tablet (60 mg total) by mouth daily. 04/27/17   Isaiah Serge, NP  Liraglutide (VICTOZA) 18 MG/3ML SOPN Inject 1.2 mg into the skin daily.     [provider]  loperamide (IMODIUM A-D) 2 MG tablet Take 2 mg by mouth 4 (four) times daily as needed for diarrhea or loose stools.    [provider]  Melatonin 5 MG TABS Take 10 mg by mouth at bedtime.    [provider]  methocarbamol (ROBAXIN) 500 MG tablet Take 500 mg by mouth 4 (four) times daily.    [provider]  Multiple Vitamins-Minerals (MULTIVITAMIN WITH MINERALS) tablet Take 1 tablet by mouth daily.    [provider]  nitroGLYCERIN  (NITROSTAT) 0.4 MG SL tablet Place 1 tablet (0.4 mg total) under the tongue every 5 (five) minutes as needed for chest pain. 05/19/14   Jerline Pain, MD  pregabalin (LYRICA) 100 MG capsule Take 1 capsule (100 mg total) by mouth 2 (two) times daily. 07/25/16   Orpah Greek, MD  Rivastigmine 13.3 MG/24HR PT24 Place 1  patch (13.3 mg total) onto the skin daily. 04/27/15   Pieter Partridge, DO  spironolactone (ALDACTONE) 25 MG tablet Take 25 mg by mouth 2 (two) times daily.     [provider]  tiZANidine (ZANAFLEX) 2 MG tablet Take 2 mg by mouth every 6 (six) hours as needed for muscle spasms.    [provider]  TOUJEO SOLOSTAR 300 UNIT/ML SOPN Inject 80 mg into the skin at bedtime.  05/22/14   [provider]  traMADol (ULTRAM) 50 MG tablet Take 50 mg by mouth every 6 (six) hours as needed.    [provider]  vitamin B-12 (CYANOCOBALAMIN) 1000 MCG tablet Take 1,000 mcg by mouth daily.    [provider]    Family History Family History  Problem Relation Age of Onset  . Dementia Maternal Grandmother   . Heart disease Mother   . Heart disease Sister   . Alzheimer's disease Sister   . Dementia Maternal Aunt   . Mental illness Other     Social History Social History   Tobacco Use  . Smoking status: Former Smoker    Packs/day: 1.50    Years: 18.00    Pack years: 27.00    Types: Cigarettes    Last attempt to quit: 03/31/1968    Years since quitting: 50.4  . Smokeless tobacco: Never Used  Substance Use Topics  . Alcohol use: No    Alcohol/week: 0.0 standard drinks  . Drug use: No     Allergies   Avandia [rosiglitazone]; Benazepril; Dilaudid [hydromorphone hcl]; Fosamax [alendronate sodium]; Talwin [pentazocine]; Tetanus toxoids; and Zyrtec [cetirizine]   Review of Systems Review of Systems  All other systems reviewed and are negative.    Physical Exam Updated Vital Signs BP 121/68   Pulse 65   Temp 97.6 F (36.4 C) (Oral)    Resp 11   SpO2 93%   Physical Exam Vitals signs and nursing note reviewed.  Constitutional:      General: She is not in acute distress.    Appearance: Normal appearance. She is well-developed. She is obese.  HENT:     Head: Normocephalic. Contusion present.   Eyes:     Pupils: Pupils are equal, round, and reactive to light.  Neck:     Musculoskeletal: Normal range of motion and neck supple. No spinous process tenderness or muscular tenderness.  Cardiovascular:     Rate and Rhythm: Normal rate and regular rhythm.     Pulses: Normal pulses.     Heart sounds: Murmur present. No friction rub.  Pulmonary:     Effort: Pulmonary effort is normal.     Breath sounds: Normal breath sounds. No wheezing or rales.  Abdominal:     General: Bowel sounds are normal. There is no distension.     Palpations: Abdomen is soft.     Tenderness: There is no abdominal tenderness. There is no guarding or rebound.  Musculoskeletal: Normal range of motion.        General: No tenderness.     Right hip: Normal.     Left hip: Normal.     Right ankle: Normal.     Left ankle: Normal.     Comments: No edema  Skin:    General: Skin is warm and dry.     Findings: No rash.  Neurological:     Mental Status: She is alert and oriented to person, place, and time. Mental status is at baseline.     Cranial  Nerves: No cranial nerve deficit.     Comments: 5/5 strength in upper and lower ext.  Sensation intact  Psychiatric:        Mood and Affect: Mood normal.        Behavior: Behavior normal.        Thought Content: Thought content normal.      ED Treatments / Results  Labs (all labs ordered are listed, but only abnormal results are displayed) Labs Reviewed  CBG MONITORING, ED - Abnormal; Notable for the following components:      Result Value   Glucose-Capillary 237 (*)    All other components within normal limits    EKG None  Radiology Ct Head Wo Contrast  Result Date: 08/29/2018 CLINICAL  DATA:  Status post fall today with a blow to the back of the head. Initial encounter. EXAM: CT HEAD WITHOUT CONTRAST CT CERVICAL SPINE WITHOUT CONTRAST TECHNIQUE: Multidetector CT imaging of the head and cervical spine was performed following the standard protocol without intravenous contrast. Multiplanar CT image reconstructions of the cervical spine were also generated. COMPARISON:  Head and cervical spine CT scan 07/12/2018. FINDINGS: CT HEAD FINDINGS Brain: No evidence of acute infarction, hemorrhage, hydrocephalus, extra-axial collection or mass lesion/mass effect. Atrophy and chronic microvascular ischemic change noted. Vascular: No hyperdense vessel or unexpected calcification. Skull: Intact.  No focal lesion. Sinuses/Orbits: Status post cataract surgery.  Otherwise negative. Other: Small scalp contusion posteriorly noted. CT CERVICAL SPINE FINDINGS Alignment: Mild anterolisthesis C6 on C7 due to facet degenerative disease is unchanged. Skull base and vertebrae: No acute fracture. No primary bone lesion or focal pathologic process. Soft tissues and spinal canal: No prevertebral fluid or swelling. No visible canal hematoma. Disc levels: Intervertebral disc space height is maintained. There is some endplate spurring at P8-0. Scattered facet arthropathy noted. Upper chest: Lung apices clear. Other: None. IMPRESSION: Posterior scalp contusion without underlying fracture. No other acute abnormality head or cervical spine. Atrophy and chronic microvascular ischemic change. Cervical spondylosis. Electronically Signed   By: Inge Rise M.D.   On: 08/29/2018 14:08   Ct Cervical Spine Wo Contrast  Result Date: 08/29/2018 CLINICAL DATA:  Status post fall today with a blow to the back of the head. Initial encounter. EXAM: CT HEAD WITHOUT CONTRAST CT CERVICAL SPINE WITHOUT CONTRAST TECHNIQUE: Multidetector CT imaging of the head and cervical spine was performed following the standard protocol without intravenous  contrast. Multiplanar CT image reconstructions of the cervical spine were also generated. COMPARISON:  Head and cervical spine CT scan 07/12/2018. FINDINGS: CT HEAD FINDINGS Brain: No evidence of acute infarction, hemorrhage, hydrocephalus, extra-axial collection or mass lesion/mass effect. Atrophy and chronic microvascular ischemic change noted. Vascular: No hyperdense vessel or unexpected calcification. Skull: Intact.  No focal lesion. Sinuses/Orbits: Status post cataract surgery.  Otherwise negative. Other: Small scalp contusion posteriorly noted. CT CERVICAL SPINE FINDINGS Alignment: Mild anterolisthesis C6 on C7 due to facet degenerative disease is unchanged. Skull base and vertebrae: No acute fracture. No primary bone lesion or focal pathologic process. Soft tissues and spinal canal: No prevertebral fluid or swelling. No visible canal hematoma. Disc levels: Intervertebral disc space height is maintained. There is some endplate spurring at D9-8. Scattered facet arthropathy noted. Upper chest: Lung apices clear. Other: None. IMPRESSION: Posterior scalp contusion without underlying fracture. No other acute abnormality head or cervical spine. Atrophy and chronic microvascular ischemic change. Cervical spondylosis. Electronically Signed   By: Inge Rise M.D.   On: 08/29/2018 14:08    Procedures  Procedures (including critical care time)  Medications Ordered in ED Medications  traMADol (ULTRAM) tablet 25 mg (25 mg Oral Given 08/29/18 1339)     Initial Impression / Assessment and Plan / ED Course  I have reviewed the triage vital signs and the nursing notes.  Pertinent labs & imaging results that were available during my care of the patient were reviewed by me and considered in my medical decision making (see chart for details).       Elderly female with multiple medical problems presenting today after a fall at her memory care unit.  Patient states she had just had a bowel movement and was  standing up and they were cleaning her head and they stepped away and she fell backwards striking her head on the ledge of the shower.  She denies any loss of consciousness but is having pain to the scalp.  She denies any neck pain numbness or tingling of her arms or legs.  Patient is awake alert and in no acute distress.  Vital signs are stable.  Patient is on Eliquis for A. fib.  CT was negative for an acute bleed but did show posterior scalp contusion.  C-spine was negative for acute fracture.  Findings were discussed with the patient and she was discharged home with supportive care.  Final Clinical Impressions(s) / ED Diagnoses   Final diagnoses:  Fall, initial encounter  Contusion of scalp, initial encounter    ED Discharge Orders    None       Blanchie Dessert, MD 08/29/18 1451

## 2018-08-29 NOTE — ED Triage Notes (Addendum)
Pt arrives [facility] w/co fall. She fell forward while staff members were cleaning her and hit the back of her head on the shower lip. Pt states she was on the toilet and had a BM. Hematoma to the back of her head. No dizziness or N/V. Pain in the cervical spine. Pt takes Eliquis.  Pt A&O x4.

## 2018-08-29 NOTE — ED Notes (Addendum)
Called [facility] to give report on the pt and to get them to pick up the pt and take her back home. Nanine Means states that the manager left and only CNAS are on staff. Pt wanted me to call son to pick her up but Delfino Lovett didn't answer the phone 2 times.

## 2018-08-29 NOTE — ED Notes (Signed)
Pt son Delfino Lovett [phone]

## 2018-08-29 NOTE — Discharge Instructions (Signed)
This CAT scan of your head today showed no sign of internal bleeding or skull fracture.  You do have a large bruise which will be sore for days.  You can take your tramadol or Tylenol as needed for the pain.  Tomorrow you may be very sore everywhere and that would be normal.  If you start having severe headaches, confusion or difficulty moving her arms and legs you should return to the emergency room immediately.

## 2018-08-29 NOTE — ED Notes (Signed)
Patient's son Denice Paradise) called back and states that he would be here to pick her up in about 65mins

## 2018-09-02 DIAGNOSIS — E1142 Type 2 diabetes mellitus with diabetic polyneuropathy: Secondary | ICD-10-CM | POA: Diagnosis not present

## 2018-09-02 DIAGNOSIS — S8002XD Contusion of left knee, subsequent encounter: Secondary | ICD-10-CM | POA: Diagnosis not present

## 2018-09-02 DIAGNOSIS — G309 Alzheimer's disease, unspecified: Secondary | ICD-10-CM | POA: Diagnosis not present

## 2018-09-02 DIAGNOSIS — I5032 Chronic diastolic (congestive) heart failure: Secondary | ICD-10-CM | POA: Diagnosis not present

## 2018-09-02 DIAGNOSIS — E782 Mixed hyperlipidemia: Secondary | ICD-10-CM | POA: Diagnosis not present

## 2018-09-02 DIAGNOSIS — F0280 Dementia in other diseases classified elsewhere without behavioral disturbance: Secondary | ICD-10-CM | POA: Diagnosis not present

## 2018-09-02 DIAGNOSIS — W19XXXD Unspecified fall, subsequent encounter: Secondary | ICD-10-CM | POA: Diagnosis not present

## 2018-09-02 DIAGNOSIS — I130 Hypertensive heart and chronic kidney disease with heart failure and stage 1 through stage 4 chronic kidney disease, or unspecified chronic kidney disease: Secondary | ICD-10-CM | POA: Diagnosis not present

## 2018-09-02 DIAGNOSIS — E1122 Type 2 diabetes mellitus with diabetic chronic kidney disease: Secondary | ICD-10-CM | POA: Diagnosis not present

## 2018-09-02 DIAGNOSIS — I69354 Hemiplegia and hemiparesis following cerebral infarction affecting left non-dominant side: Secondary | ICD-10-CM | POA: Diagnosis not present

## 2018-09-02 DIAGNOSIS — M545 Low back pain: Secondary | ICD-10-CM | POA: Diagnosis not present

## 2018-09-02 DIAGNOSIS — I350 Nonrheumatic aortic (valve) stenosis: Secondary | ICD-10-CM | POA: Diagnosis not present

## 2018-09-09 DIAGNOSIS — G309 Alzheimer's disease, unspecified: Secondary | ICD-10-CM | POA: Diagnosis not present

## 2018-09-09 DIAGNOSIS — E1142 Type 2 diabetes mellitus with diabetic polyneuropathy: Secondary | ICD-10-CM | POA: Diagnosis not present

## 2018-09-09 DIAGNOSIS — W19XXXD Unspecified fall, subsequent encounter: Secondary | ICD-10-CM | POA: Diagnosis not present

## 2018-09-09 DIAGNOSIS — I69354 Hemiplegia and hemiparesis following cerebral infarction affecting left non-dominant side: Secondary | ICD-10-CM | POA: Diagnosis not present

## 2018-09-09 DIAGNOSIS — F0280 Dementia in other diseases classified elsewhere without behavioral disturbance: Secondary | ICD-10-CM | POA: Diagnosis not present

## 2018-09-09 DIAGNOSIS — I5032 Chronic diastolic (congestive) heart failure: Secondary | ICD-10-CM | POA: Diagnosis not present

## 2018-09-09 DIAGNOSIS — S8002XD Contusion of left knee, subsequent encounter: Secondary | ICD-10-CM | POA: Diagnosis not present

## 2018-09-09 DIAGNOSIS — I130 Hypertensive heart and chronic kidney disease with heart failure and stage 1 through stage 4 chronic kidney disease, or unspecified chronic kidney disease: Secondary | ICD-10-CM | POA: Diagnosis not present

## 2018-09-09 DIAGNOSIS — E1122 Type 2 diabetes mellitus with diabetic chronic kidney disease: Secondary | ICD-10-CM | POA: Diagnosis not present

## 2018-09-09 DIAGNOSIS — M545 Low back pain: Secondary | ICD-10-CM | POA: Diagnosis not present

## 2018-09-10 DIAGNOSIS — I130 Hypertensive heart and chronic kidney disease with heart failure and stage 1 through stage 4 chronic kidney disease, or unspecified chronic kidney disease: Secondary | ICD-10-CM | POA: Diagnosis not present

## 2018-09-10 DIAGNOSIS — S8002XD Contusion of left knee, subsequent encounter: Secondary | ICD-10-CM | POA: Diagnosis not present

## 2018-09-10 DIAGNOSIS — I69354 Hemiplegia and hemiparesis following cerebral infarction affecting left non-dominant side: Secondary | ICD-10-CM | POA: Diagnosis not present

## 2018-09-10 DIAGNOSIS — E1142 Type 2 diabetes mellitus with diabetic polyneuropathy: Secondary | ICD-10-CM | POA: Diagnosis not present

## 2018-09-10 DIAGNOSIS — I5032 Chronic diastolic (congestive) heart failure: Secondary | ICD-10-CM | POA: Diagnosis not present

## 2018-09-10 DIAGNOSIS — W19XXXD Unspecified fall, subsequent encounter: Secondary | ICD-10-CM | POA: Diagnosis not present

## 2018-09-10 DIAGNOSIS — E1122 Type 2 diabetes mellitus with diabetic chronic kidney disease: Secondary | ICD-10-CM | POA: Diagnosis not present

## 2018-09-10 DIAGNOSIS — M545 Low back pain: Secondary | ICD-10-CM | POA: Diagnosis not present

## 2018-09-10 DIAGNOSIS — G309 Alzheimer's disease, unspecified: Secondary | ICD-10-CM | POA: Diagnosis not present

## 2018-09-10 DIAGNOSIS — F0280 Dementia in other diseases classified elsewhere without behavioral disturbance: Secondary | ICD-10-CM | POA: Diagnosis not present

## 2018-09-13 DIAGNOSIS — I5032 Chronic diastolic (congestive) heart failure: Secondary | ICD-10-CM | POA: Diagnosis not present

## 2018-09-13 DIAGNOSIS — E1122 Type 2 diabetes mellitus with diabetic chronic kidney disease: Secondary | ICD-10-CM | POA: Diagnosis not present

## 2018-09-13 DIAGNOSIS — W19XXXD Unspecified fall, subsequent encounter: Secondary | ICD-10-CM | POA: Diagnosis not present

## 2018-09-13 DIAGNOSIS — M545 Low back pain: Secondary | ICD-10-CM | POA: Diagnosis not present

## 2018-09-13 DIAGNOSIS — I69354 Hemiplegia and hemiparesis following cerebral infarction affecting left non-dominant side: Secondary | ICD-10-CM | POA: Diagnosis not present

## 2018-09-13 DIAGNOSIS — E1142 Type 2 diabetes mellitus with diabetic polyneuropathy: Secondary | ICD-10-CM | POA: Diagnosis not present

## 2018-09-13 DIAGNOSIS — F0280 Dementia in other diseases classified elsewhere without behavioral disturbance: Secondary | ICD-10-CM | POA: Diagnosis not present

## 2018-09-13 DIAGNOSIS — G309 Alzheimer's disease, unspecified: Secondary | ICD-10-CM | POA: Diagnosis not present

## 2018-09-13 DIAGNOSIS — S8002XD Contusion of left knee, subsequent encounter: Secondary | ICD-10-CM | POA: Diagnosis not present

## 2018-09-13 DIAGNOSIS — I130 Hypertensive heart and chronic kidney disease with heart failure and stage 1 through stage 4 chronic kidney disease, or unspecified chronic kidney disease: Secondary | ICD-10-CM | POA: Diagnosis not present

## 2018-09-15 DIAGNOSIS — G309 Alzheimer's disease, unspecified: Secondary | ICD-10-CM | POA: Diagnosis not present

## 2018-09-15 DIAGNOSIS — E1142 Type 2 diabetes mellitus with diabetic polyneuropathy: Secondary | ICD-10-CM | POA: Diagnosis not present

## 2018-09-15 DIAGNOSIS — M545 Low back pain: Secondary | ICD-10-CM | POA: Diagnosis not present

## 2018-09-15 DIAGNOSIS — W19XXXD Unspecified fall, subsequent encounter: Secondary | ICD-10-CM | POA: Diagnosis not present

## 2018-09-15 DIAGNOSIS — I5032 Chronic diastolic (congestive) heart failure: Secondary | ICD-10-CM | POA: Diagnosis not present

## 2018-09-15 DIAGNOSIS — E1122 Type 2 diabetes mellitus with diabetic chronic kidney disease: Secondary | ICD-10-CM | POA: Diagnosis not present

## 2018-09-15 DIAGNOSIS — I69354 Hemiplegia and hemiparesis following cerebral infarction affecting left non-dominant side: Secondary | ICD-10-CM | POA: Diagnosis not present

## 2018-09-15 DIAGNOSIS — S8002XD Contusion of left knee, subsequent encounter: Secondary | ICD-10-CM | POA: Diagnosis not present

## 2018-09-15 DIAGNOSIS — I130 Hypertensive heart and chronic kidney disease with heart failure and stage 1 through stage 4 chronic kidney disease, or unspecified chronic kidney disease: Secondary | ICD-10-CM | POA: Diagnosis not present

## 2018-09-15 DIAGNOSIS — F0280 Dementia in other diseases classified elsewhere without behavioral disturbance: Secondary | ICD-10-CM | POA: Diagnosis not present

## 2018-09-19 DIAGNOSIS — I69354 Hemiplegia and hemiparesis following cerebral infarction affecting left non-dominant side: Secondary | ICD-10-CM | POA: Diagnosis not present

## 2018-09-19 DIAGNOSIS — F0280 Dementia in other diseases classified elsewhere without behavioral disturbance: Secondary | ICD-10-CM | POA: Diagnosis not present

## 2018-09-19 DIAGNOSIS — E1142 Type 2 diabetes mellitus with diabetic polyneuropathy: Secondary | ICD-10-CM | POA: Diagnosis not present

## 2018-09-19 DIAGNOSIS — G309 Alzheimer's disease, unspecified: Secondary | ICD-10-CM | POA: Diagnosis not present

## 2018-09-20 DIAGNOSIS — E1142 Type 2 diabetes mellitus with diabetic polyneuropathy: Secondary | ICD-10-CM | POA: Diagnosis not present

## 2018-09-20 DIAGNOSIS — S8002XD Contusion of left knee, subsequent encounter: Secondary | ICD-10-CM | POA: Diagnosis not present

## 2018-09-20 DIAGNOSIS — W19XXXD Unspecified fall, subsequent encounter: Secondary | ICD-10-CM | POA: Diagnosis not present

## 2018-09-20 DIAGNOSIS — E1122 Type 2 diabetes mellitus with diabetic chronic kidney disease: Secondary | ICD-10-CM | POA: Diagnosis not present

## 2018-09-20 DIAGNOSIS — G309 Alzheimer's disease, unspecified: Secondary | ICD-10-CM | POA: Diagnosis not present

## 2018-09-20 DIAGNOSIS — F0280 Dementia in other diseases classified elsewhere without behavioral disturbance: Secondary | ICD-10-CM | POA: Diagnosis not present

## 2018-09-20 DIAGNOSIS — M545 Low back pain: Secondary | ICD-10-CM | POA: Diagnosis not present

## 2018-09-20 DIAGNOSIS — I130 Hypertensive heart and chronic kidney disease with heart failure and stage 1 through stage 4 chronic kidney disease, or unspecified chronic kidney disease: Secondary | ICD-10-CM | POA: Diagnosis not present

## 2018-09-20 DIAGNOSIS — I5032 Chronic diastolic (congestive) heart failure: Secondary | ICD-10-CM | POA: Diagnosis not present

## 2018-09-20 DIAGNOSIS — I69354 Hemiplegia and hemiparesis following cerebral infarction affecting left non-dominant side: Secondary | ICD-10-CM | POA: Diagnosis not present

## 2018-09-21 DIAGNOSIS — J449 Chronic obstructive pulmonary disease, unspecified: Secondary | ICD-10-CM | POA: Diagnosis not present

## 2018-09-21 DIAGNOSIS — R296 Repeated falls: Secondary | ICD-10-CM | POA: Diagnosis not present

## 2018-09-21 DIAGNOSIS — G250 Essential tremor: Secondary | ICD-10-CM | POA: Diagnosis not present

## 2018-09-23 ENCOUNTER — Ambulatory Visit: Payer: Medicare Other | Admitting: Neurology

## 2018-09-23 DIAGNOSIS — E1122 Type 2 diabetes mellitus with diabetic chronic kidney disease: Secondary | ICD-10-CM | POA: Diagnosis not present

## 2018-09-23 DIAGNOSIS — M545 Low back pain: Secondary | ICD-10-CM | POA: Diagnosis not present

## 2018-09-23 DIAGNOSIS — I5032 Chronic diastolic (congestive) heart failure: Secondary | ICD-10-CM | POA: Diagnosis not present

## 2018-09-23 DIAGNOSIS — E1142 Type 2 diabetes mellitus with diabetic polyneuropathy: Secondary | ICD-10-CM | POA: Diagnosis not present

## 2018-09-23 DIAGNOSIS — I130 Hypertensive heart and chronic kidney disease with heart failure and stage 1 through stage 4 chronic kidney disease, or unspecified chronic kidney disease: Secondary | ICD-10-CM | POA: Diagnosis not present

## 2018-09-23 DIAGNOSIS — W19XXXD Unspecified fall, subsequent encounter: Secondary | ICD-10-CM | POA: Diagnosis not present

## 2018-09-23 DIAGNOSIS — F0280 Dementia in other diseases classified elsewhere without behavioral disturbance: Secondary | ICD-10-CM | POA: Diagnosis not present

## 2018-09-23 DIAGNOSIS — I69354 Hemiplegia and hemiparesis following cerebral infarction affecting left non-dominant side: Secondary | ICD-10-CM | POA: Diagnosis not present

## 2018-09-23 DIAGNOSIS — S8002XD Contusion of left knee, subsequent encounter: Secondary | ICD-10-CM | POA: Diagnosis not present

## 2018-09-23 DIAGNOSIS — G309 Alzheimer's disease, unspecified: Secondary | ICD-10-CM | POA: Diagnosis not present

## 2018-09-27 DIAGNOSIS — M545 Low back pain: Secondary | ICD-10-CM | POA: Diagnosis not present

## 2018-09-27 DIAGNOSIS — F0280 Dementia in other diseases classified elsewhere without behavioral disturbance: Secondary | ICD-10-CM | POA: Diagnosis not present

## 2018-09-27 DIAGNOSIS — S8002XD Contusion of left knee, subsequent encounter: Secondary | ICD-10-CM | POA: Diagnosis not present

## 2018-09-27 DIAGNOSIS — I130 Hypertensive heart and chronic kidney disease with heart failure and stage 1 through stage 4 chronic kidney disease, or unspecified chronic kidney disease: Secondary | ICD-10-CM | POA: Diagnosis not present

## 2018-09-27 DIAGNOSIS — E1142 Type 2 diabetes mellitus with diabetic polyneuropathy: Secondary | ICD-10-CM | POA: Diagnosis not present

## 2018-09-27 DIAGNOSIS — I5032 Chronic diastolic (congestive) heart failure: Secondary | ICD-10-CM | POA: Diagnosis not present

## 2018-09-27 DIAGNOSIS — G309 Alzheimer's disease, unspecified: Secondary | ICD-10-CM | POA: Diagnosis not present

## 2018-09-27 DIAGNOSIS — W19XXXD Unspecified fall, subsequent encounter: Secondary | ICD-10-CM | POA: Diagnosis not present

## 2018-09-27 DIAGNOSIS — E1122 Type 2 diabetes mellitus with diabetic chronic kidney disease: Secondary | ICD-10-CM | POA: Diagnosis not present

## 2018-09-27 DIAGNOSIS — I69354 Hemiplegia and hemiparesis following cerebral infarction affecting left non-dominant side: Secondary | ICD-10-CM | POA: Diagnosis not present

## 2018-09-29 DIAGNOSIS — Z794 Long term (current) use of insulin: Secondary | ICD-10-CM | POA: Diagnosis not present

## 2018-09-29 DIAGNOSIS — S8002XD Contusion of left knee, subsequent encounter: Secondary | ICD-10-CM | POA: Diagnosis not present

## 2018-09-29 DIAGNOSIS — J449 Chronic obstructive pulmonary disease, unspecified: Secondary | ICD-10-CM | POA: Diagnosis not present

## 2018-09-29 DIAGNOSIS — I5032 Chronic diastolic (congestive) heart failure: Secondary | ICD-10-CM | POA: Diagnosis not present

## 2018-09-29 DIAGNOSIS — F0280 Dementia in other diseases classified elsewhere without behavioral disturbance: Secondary | ICD-10-CM | POA: Diagnosis not present

## 2018-09-29 DIAGNOSIS — I130 Hypertensive heart and chronic kidney disease with heart failure and stage 1 through stage 4 chronic kidney disease, or unspecified chronic kidney disease: Secondary | ICD-10-CM | POA: Diagnosis not present

## 2018-09-29 DIAGNOSIS — F329 Major depressive disorder, single episode, unspecified: Secondary | ICD-10-CM | POA: Diagnosis not present

## 2018-09-29 DIAGNOSIS — I4821 Permanent atrial fibrillation: Secondary | ICD-10-CM | POA: Diagnosis not present

## 2018-09-29 DIAGNOSIS — M545 Low back pain: Secondary | ICD-10-CM | POA: Diagnosis not present

## 2018-09-29 DIAGNOSIS — Z7901 Long term (current) use of anticoagulants: Secondary | ICD-10-CM | POA: Diagnosis not present

## 2018-09-29 DIAGNOSIS — G309 Alzheimer's disease, unspecified: Secondary | ICD-10-CM | POA: Diagnosis not present

## 2018-09-29 DIAGNOSIS — W19XXXD Unspecified fall, subsequent encounter: Secondary | ICD-10-CM | POA: Diagnosis not present

## 2018-09-29 DIAGNOSIS — E1142 Type 2 diabetes mellitus with diabetic polyneuropathy: Secondary | ICD-10-CM | POA: Diagnosis not present

## 2018-09-29 DIAGNOSIS — I69354 Hemiplegia and hemiparesis following cerebral infarction affecting left non-dominant side: Secondary | ICD-10-CM | POA: Diagnosis not present

## 2018-09-29 DIAGNOSIS — N183 Chronic kidney disease, stage 3 (moderate): Secondary | ICD-10-CM | POA: Diagnosis not present

## 2018-09-29 DIAGNOSIS — E1122 Type 2 diabetes mellitus with diabetic chronic kidney disease: Secondary | ICD-10-CM | POA: Diagnosis not present

## 2018-09-30 ENCOUNTER — Ambulatory Visit: Payer: Medicare Other | Admitting: Neurology

## 2018-10-06 DIAGNOSIS — J449 Chronic obstructive pulmonary disease, unspecified: Secondary | ICD-10-CM | POA: Diagnosis not present

## 2018-10-06 DIAGNOSIS — F329 Major depressive disorder, single episode, unspecified: Secondary | ICD-10-CM | POA: Diagnosis not present

## 2018-10-06 DIAGNOSIS — Z7901 Long term (current) use of anticoagulants: Secondary | ICD-10-CM | POA: Diagnosis not present

## 2018-10-06 DIAGNOSIS — M545 Low back pain: Secondary | ICD-10-CM | POA: Diagnosis not present

## 2018-10-06 DIAGNOSIS — N183 Chronic kidney disease, stage 3 (moderate): Secondary | ICD-10-CM | POA: Diagnosis not present

## 2018-10-06 DIAGNOSIS — I5032 Chronic diastolic (congestive) heart failure: Secondary | ICD-10-CM | POA: Diagnosis not present

## 2018-10-06 DIAGNOSIS — E1122 Type 2 diabetes mellitus with diabetic chronic kidney disease: Secondary | ICD-10-CM | POA: Diagnosis not present

## 2018-10-06 DIAGNOSIS — G309 Alzheimer's disease, unspecified: Secondary | ICD-10-CM | POA: Diagnosis not present

## 2018-10-06 DIAGNOSIS — I69354 Hemiplegia and hemiparesis following cerebral infarction affecting left non-dominant side: Secondary | ICD-10-CM | POA: Diagnosis not present

## 2018-10-06 DIAGNOSIS — E1142 Type 2 diabetes mellitus with diabetic polyneuropathy: Secondary | ICD-10-CM | POA: Diagnosis not present

## 2018-10-06 DIAGNOSIS — S8002XD Contusion of left knee, subsequent encounter: Secondary | ICD-10-CM | POA: Diagnosis not present

## 2018-10-06 DIAGNOSIS — Z794 Long term (current) use of insulin: Secondary | ICD-10-CM | POA: Diagnosis not present

## 2018-10-06 DIAGNOSIS — W19XXXD Unspecified fall, subsequent encounter: Secondary | ICD-10-CM | POA: Diagnosis not present

## 2018-10-06 DIAGNOSIS — I130 Hypertensive heart and chronic kidney disease with heart failure and stage 1 through stage 4 chronic kidney disease, or unspecified chronic kidney disease: Secondary | ICD-10-CM | POA: Diagnosis not present

## 2018-10-06 DIAGNOSIS — I4821 Permanent atrial fibrillation: Secondary | ICD-10-CM | POA: Diagnosis not present

## 2018-10-06 DIAGNOSIS — F0280 Dementia in other diseases classified elsewhere without behavioral disturbance: Secondary | ICD-10-CM | POA: Diagnosis not present

## 2018-10-08 DIAGNOSIS — G309 Alzheimer's disease, unspecified: Secondary | ICD-10-CM | POA: Diagnosis not present

## 2018-10-08 DIAGNOSIS — F0280 Dementia in other diseases classified elsewhere without behavioral disturbance: Secondary | ICD-10-CM | POA: Diagnosis not present

## 2018-10-08 DIAGNOSIS — M545 Low back pain: Secondary | ICD-10-CM | POA: Diagnosis not present

## 2018-10-08 DIAGNOSIS — S8002XD Contusion of left knee, subsequent encounter: Secondary | ICD-10-CM | POA: Diagnosis not present

## 2018-10-08 DIAGNOSIS — I5032 Chronic diastolic (congestive) heart failure: Secondary | ICD-10-CM | POA: Diagnosis not present

## 2018-10-08 DIAGNOSIS — N183 Chronic kidney disease, stage 3 (moderate): Secondary | ICD-10-CM | POA: Diagnosis not present

## 2018-10-08 DIAGNOSIS — Z7901 Long term (current) use of anticoagulants: Secondary | ICD-10-CM | POA: Diagnosis not present

## 2018-10-08 DIAGNOSIS — I4821 Permanent atrial fibrillation: Secondary | ICD-10-CM | POA: Diagnosis not present

## 2018-10-08 DIAGNOSIS — E1122 Type 2 diabetes mellitus with diabetic chronic kidney disease: Secondary | ICD-10-CM | POA: Diagnosis not present

## 2018-10-08 DIAGNOSIS — F329 Major depressive disorder, single episode, unspecified: Secondary | ICD-10-CM | POA: Diagnosis not present

## 2018-10-08 DIAGNOSIS — I130 Hypertensive heart and chronic kidney disease with heart failure and stage 1 through stage 4 chronic kidney disease, or unspecified chronic kidney disease: Secondary | ICD-10-CM | POA: Diagnosis not present

## 2018-10-08 DIAGNOSIS — J449 Chronic obstructive pulmonary disease, unspecified: Secondary | ICD-10-CM | POA: Diagnosis not present

## 2018-10-08 DIAGNOSIS — Z794 Long term (current) use of insulin: Secondary | ICD-10-CM | POA: Diagnosis not present

## 2018-10-08 DIAGNOSIS — E1142 Type 2 diabetes mellitus with diabetic polyneuropathy: Secondary | ICD-10-CM | POA: Diagnosis not present

## 2018-10-08 DIAGNOSIS — W19XXXD Unspecified fall, subsequent encounter: Secondary | ICD-10-CM | POA: Diagnosis not present

## 2018-10-08 DIAGNOSIS — I69354 Hemiplegia and hemiparesis following cerebral infarction affecting left non-dominant side: Secondary | ICD-10-CM | POA: Diagnosis not present

## 2018-10-12 DIAGNOSIS — E1122 Type 2 diabetes mellitus with diabetic chronic kidney disease: Secondary | ICD-10-CM | POA: Diagnosis not present

## 2018-10-12 DIAGNOSIS — F329 Major depressive disorder, single episode, unspecified: Secondary | ICD-10-CM | POA: Diagnosis not present

## 2018-10-12 DIAGNOSIS — I4821 Permanent atrial fibrillation: Secondary | ICD-10-CM | POA: Diagnosis not present

## 2018-10-12 DIAGNOSIS — W19XXXD Unspecified fall, subsequent encounter: Secondary | ICD-10-CM | POA: Diagnosis not present

## 2018-10-12 DIAGNOSIS — N183 Chronic kidney disease, stage 3 (moderate): Secondary | ICD-10-CM | POA: Diagnosis not present

## 2018-10-12 DIAGNOSIS — J449 Chronic obstructive pulmonary disease, unspecified: Secondary | ICD-10-CM | POA: Diagnosis not present

## 2018-10-12 DIAGNOSIS — Z794 Long term (current) use of insulin: Secondary | ICD-10-CM | POA: Diagnosis not present

## 2018-10-12 DIAGNOSIS — I69354 Hemiplegia and hemiparesis following cerebral infarction affecting left non-dominant side: Secondary | ICD-10-CM | POA: Diagnosis not present

## 2018-10-12 DIAGNOSIS — F0280 Dementia in other diseases classified elsewhere without behavioral disturbance: Secondary | ICD-10-CM | POA: Diagnosis not present

## 2018-10-12 DIAGNOSIS — E1142 Type 2 diabetes mellitus with diabetic polyneuropathy: Secondary | ICD-10-CM | POA: Diagnosis not present

## 2018-10-12 DIAGNOSIS — Z7901 Long term (current) use of anticoagulants: Secondary | ICD-10-CM | POA: Diagnosis not present

## 2018-10-12 DIAGNOSIS — G309 Alzheimer's disease, unspecified: Secondary | ICD-10-CM | POA: Diagnosis not present

## 2018-10-12 DIAGNOSIS — I130 Hypertensive heart and chronic kidney disease with heart failure and stage 1 through stage 4 chronic kidney disease, or unspecified chronic kidney disease: Secondary | ICD-10-CM | POA: Diagnosis not present

## 2018-10-12 DIAGNOSIS — M545 Low back pain: Secondary | ICD-10-CM | POA: Diagnosis not present

## 2018-10-12 DIAGNOSIS — I5032 Chronic diastolic (congestive) heart failure: Secondary | ICD-10-CM | POA: Diagnosis not present

## 2018-10-12 DIAGNOSIS — S8002XD Contusion of left knee, subsequent encounter: Secondary | ICD-10-CM | POA: Diagnosis not present

## 2018-10-15 DIAGNOSIS — F0280 Dementia in other diseases classified elsewhere without behavioral disturbance: Secondary | ICD-10-CM | POA: Diagnosis not present

## 2018-10-15 DIAGNOSIS — N183 Chronic kidney disease, stage 3 (moderate): Secondary | ICD-10-CM | POA: Diagnosis not present

## 2018-10-15 DIAGNOSIS — I69354 Hemiplegia and hemiparesis following cerebral infarction affecting left non-dominant side: Secondary | ICD-10-CM | POA: Diagnosis not present

## 2018-10-15 DIAGNOSIS — W19XXXD Unspecified fall, subsequent encounter: Secondary | ICD-10-CM | POA: Diagnosis not present

## 2018-10-15 DIAGNOSIS — I130 Hypertensive heart and chronic kidney disease with heart failure and stage 1 through stage 4 chronic kidney disease, or unspecified chronic kidney disease: Secondary | ICD-10-CM | POA: Diagnosis not present

## 2018-10-15 DIAGNOSIS — Z794 Long term (current) use of insulin: Secondary | ICD-10-CM | POA: Diagnosis not present

## 2018-10-15 DIAGNOSIS — I4821 Permanent atrial fibrillation: Secondary | ICD-10-CM | POA: Diagnosis not present

## 2018-10-15 DIAGNOSIS — E1122 Type 2 diabetes mellitus with diabetic chronic kidney disease: Secondary | ICD-10-CM | POA: Diagnosis not present

## 2018-10-15 DIAGNOSIS — Z7901 Long term (current) use of anticoagulants: Secondary | ICD-10-CM | POA: Diagnosis not present

## 2018-10-15 DIAGNOSIS — E1142 Type 2 diabetes mellitus with diabetic polyneuropathy: Secondary | ICD-10-CM | POA: Diagnosis not present

## 2018-10-15 DIAGNOSIS — I5032 Chronic diastolic (congestive) heart failure: Secondary | ICD-10-CM | POA: Diagnosis not present

## 2018-10-15 DIAGNOSIS — J449 Chronic obstructive pulmonary disease, unspecified: Secondary | ICD-10-CM | POA: Diagnosis not present

## 2018-10-15 DIAGNOSIS — G309 Alzheimer's disease, unspecified: Secondary | ICD-10-CM | POA: Diagnosis not present

## 2018-10-15 DIAGNOSIS — S8002XD Contusion of left knee, subsequent encounter: Secondary | ICD-10-CM | POA: Diagnosis not present

## 2018-10-15 DIAGNOSIS — M545 Low back pain: Secondary | ICD-10-CM | POA: Diagnosis not present

## 2018-10-15 DIAGNOSIS — F329 Major depressive disorder, single episode, unspecified: Secondary | ICD-10-CM | POA: Diagnosis not present

## 2018-10-21 DIAGNOSIS — R296 Repeated falls: Secondary | ICD-10-CM | POA: Diagnosis not present

## 2018-10-21 DIAGNOSIS — J449 Chronic obstructive pulmonary disease, unspecified: Secondary | ICD-10-CM | POA: Diagnosis not present

## 2018-10-21 DIAGNOSIS — G250 Essential tremor: Secondary | ICD-10-CM | POA: Diagnosis not present

## 2018-11-21 DIAGNOSIS — J449 Chronic obstructive pulmonary disease, unspecified: Secondary | ICD-10-CM | POA: Diagnosis not present

## 2018-11-21 DIAGNOSIS — R296 Repeated falls: Secondary | ICD-10-CM | POA: Diagnosis not present

## 2018-11-21 DIAGNOSIS — G250 Essential tremor: Secondary | ICD-10-CM | POA: Diagnosis not present

## 2018-11-23 NOTE — Progress Notes (Signed)
Virtual Visit via Video Note The purpose of this virtual visit is to provide medical care while limiting exposure to the novel coronavirus.    Consent was obtained for video visit:  Yes Answered questions that patient had about telehealth interaction:  Yes I discussed the limitations, risks, security and privacy concerns of performing an evaluation and management service by telemedicine. I also discussed with the patient that there may be a patient responsible charge related to this service. The patient expressed understanding and agreed to proceed.  Pt location: Home Physician Location: office Name of referring provider:  Carol Ada, MD I connected with [name] at patients initiation/request on 11/24/2018 at  9:50 AM EDT by video enabled telemedicine application and verified that I am speaking with the correct person using two identifiers. Pt MRN:  [id number] Pt DOB:  [birth year] Video Participants:  [name]; her nurse   History of Present Illness:  Kara Ortega is an 85 year old right-handed woman with Alzheimer's dementia, atrial fibrillation, CHF, HTN, hypothyroidism, fibromyalgia, sleep apnea, type 2 diabetes mellitus who follows up for essential tremor.  UPDATE: She reports worsening tremor.  It is difficult to write.  Previous medication:  Propranolol (hypotension)  Past Medical History: Past Medical History:  Diagnosis Date  . A-fib ([place])   . Abnormal heart rhythms   . Alzheimer disease ([place])   . Anginal pain ([place])   . Back pain   . CHF (congestive heart failure) ([place])   . Chronic kidney disease    STAGE 3   . COPD (chronic obstructive pulmonary disease) ([place])   . Depression   . Diabetes ([place])    insulin dependent  . Hypertension   . Leg pain   . Memory loss   . Neuropathy   . Shortness of breath   . Sleep apnea    uses bipap X12 years.  . Stroke [facility])    LEFT SIDE RESIDUAL    Medications: Outpatient Encounter Medications as of  11/24/2018  Medication Sig  . acetaminophen (TYLENOL) 325 MG tablet Take 650 mg by mouth every 6 (six) hours as needed.  Marland Kitchen allopurinol (ZYLOPRIM) 100 MG tablet Take 200 mg by mouth daily.   Marland Kitchen ALPRAZolam (XANAX) 0.25 MG tablet Take 0.25 mg by mouth every 12 (twelve) hours as needed for anxiety or sleep.   Marland Kitchen apixaban (ELIQUIS) 5 MG TABS tablet Take 1 tablet (5 mg total) by mouth 2 (two) times daily.  . Ascorbic Acid (VITAMIN C) 1000 MG tablet Take 1,000 mg by mouth daily.  Marland Kitchen atorvastatin (LIPITOR) 10 MG tablet Take 10 mg by mouth at bedtime.   . B-D UF III MINI PEN NEEDLES 31G X 5 MM MISC U UTD TO INJECT INSULIN  . Cholecalciferol (HM VITAMIN D3) 4000 UNITS CAPS Take 1,000 Units by mouth daily.   . citalopram (CELEXA) 20 MG tablet Take 20 mg by mouth daily.  . diclofenac sodium (VOLTAREN) 1 % GEL Apply 4 g topically 4 (four) times daily as needed (For pain.).   Marland Kitchen diltiazem (DILACOR XR) 180 MG 24 hr capsule Take 1 capsule (180 mg total) by mouth daily.  Marland Kitchen docusate sodium (COLACE) 100 MG capsule Take 100 mg by mouth daily as needed for mild constipation.  . furosemide (LASIX) 20 MG tablet Take 20 mg by mouth daily.   Marland Kitchen guaiFENesin (MUCINEX) 600 MG 12 hr tablet Take by mouth every 12 (twelve) hours as needed for cough or to loosen phlegm.  . hydrocortisone (ANUSOL-HC) 25 MG  suppository Place 25 mg rectally 2 (two) times daily as needed for hemorrhoids.   . isosorbide mononitrate (IMDUR) 60 MG 24 hr tablet Take 1 tablet (60 mg total) by mouth daily.  Marland Kitchen ketoconazole (NIZORAL) 2 % cream Apply 1 application topically as needed. For rash  . Liraglutide (VICTOZA) 18 MG/3ML SOPN Inject 1.2 mg into the skin daily.   Marland Kitchen loperamide (IMODIUM A-D) 2 MG tablet Take 2 mg by mouth 4 (four) times daily as needed for diarrhea or loose stools.  . Melatonin 5 MG TABS Take 10 mg by mouth at bedtime.  . methocarbamol (ROBAXIN) 500 MG tablet Take 500 mg by mouth every 6 (six) hours as needed (back pain).   . nitroGLYCERIN  (NITROSTAT) 0.4 MG SL tablet Place 1 tablet (0.4 mg total) under the tongue every 5 (five) minutes as needed for chest pain.  Vladimir Faster Glycol-Propyl Glycol (SYSTANE) 0.4-0.3 % SOLN Place 1 drop into both eyes 2 (two) times a day.  . pregabalin (LYRICA) 100 MG capsule Take 1 capsule (100 mg total) by mouth 2 (two) times daily. (Patient taking differently: Take 100-200 mg by mouth 2 (two) times daily. Give 1 capsule (100mg ) in th am, then 2 capsules (200mg ) in the evening)  . psyllium (METAMUCIL SMOOTH TEXTURE) 28 % packet Take 1 packet by mouth daily as needed (constipation).  . Rivastigmine 13.3 MG/24HR PT24 Place 1 patch (13.3 mg total) onto the skin daily.  Marland Kitchen spironolactone (ALDACTONE) 25 MG tablet Take 25 mg by mouth 2 (two) times daily.   Marland Kitchen tiZANidine (ZANAFLEX) 2 MG tablet Take 2 mg by mouth every 6 (six) hours as needed for muscle spasms.  Nelva Nay SOLOSTAR 300 UNIT/ML SOPN Inject 80 mg into the skin at bedtime.   . traMADol (ULTRAM) 50 MG tablet Take 25 mg by mouth every 8 (eight) hours as needed for moderate pain.   . vitamin B-12 (CYANOCOBALAMIN) 1000 MCG tablet Take 1,000 mcg by mouth daily.   No facility-administered encounter medications on file as of 11/24/2018.     Allergies: Allergies  Allergen Reactions  . Avandia [Rosiglitazone] Other (See Comments)    edema  . Benazepril Other (See Comments)    unknown  . Dilaudid [Hydromorphone Hcl] Other (See Comments)    "does not tolerate strong pain medications" per pt  . Fosamax [Alendronate Sodium] Nausea And Vomiting  . Talwin [Pentazocine] Nausea And Vomiting  . Tetanus Toxoids Other (See Comments)    "allergic to something in the tetanus injection" per patient  . Zyrtec [Cetirizine] Other (See Comments)    unknown    Family History: Family History  Problem Relation Age of Onset  . Dementia Maternal Grandmother   . Heart disease Mother   . Heart disease Sister   . Alzheimer's disease Sister   . Dementia Maternal Aunt    . Mental illness Other     Social History: Social History   Socioeconomic History  . Marital status: Single    Spouse name: Not on file  . Number of children: 3  . Years of education: Not on file  . Highest education level: Not on file  Occupational History  . Occupation: retired  Scientific laboratory technician  . Financial resource strain: Not on file  . Food insecurity    Worry: Not on file    Inability: Not on file  . Transportation needs    Medical: Not on file    Non-medical: Not on file  Tobacco Use  . Smoking status: Former  Smoker    Packs/day: 1.50    Years: 18.00    Pack years: 27.00    Types: Cigarettes    Quit date: 03/31/1968    Years since quitting: 50.6  . Smokeless tobacco: Never Used  Substance and Sexual Activity  . Alcohol use: No    Alcohol/week: 0.0 standard drinks  . Drug use: No  . Sexual activity: Never  Lifestyle  . Physical activity    Days per week: Not on file    Minutes per session: Not on file  . Stress: Not on file  Relationships  . Social Herbalist on phone: Not on file    Gets together: Not on file    Attends religious service: Not on file    Active member of club or organization: Not on file    Attends meetings of clubs or organizations: Not on file    Relationship status: Not on file  . Intimate partner violence    Fear of current or ex partner: Not on file    Emotionally abused: Not on file    Physically abused: Not on file    Forced sexual activity: Not on file  Other Topics Concern  . Not on file  Social History Narrative  . Not on file    Observations/Objective:   There were no vitals taken for this visit. No acute distress.  Alert and oriented.  Speech fluent and not dysarthric.  Language intact.  Eyes orthophoric on primary gaze.  Face symmetric.  Assessment and Plan:   Essential tremor.  Hypotensive on propranolol.  Would not use primidone due to possibility of decreasing efficacy of Eliquis.  1.  Start topiramate  25mg  at bedtime 2.  Follow up in 4 months.  Follow Up Instructions:    -I discussed the assessment and treatment plan with the patient. The patient was provided an opportunity to ask questions and all were answered. The patient agreed with the plan and demonstrated an understanding of the instructions.   The patient was advised to call back or seek an in-person evaluation if the symptoms worsen or if the condition fails to improve as anticipated.    Total Time spent in visit with the patient was:  21 minutes   Dudley Major, DO

## 2018-11-24 ENCOUNTER — Other Ambulatory Visit: Payer: Self-pay

## 2018-11-24 ENCOUNTER — Encounter: Payer: Self-pay | Admitting: Neurology

## 2018-11-24 ENCOUNTER — Telehealth (INDEPENDENT_AMBULATORY_CARE_PROVIDER_SITE_OTHER): Payer: Medicare Other | Admitting: Neurology

## 2018-11-24 DIAGNOSIS — G250 Essential tremor: Secondary | ICD-10-CM

## 2018-11-24 MED ORDER — TOPIRAMATE 25 MG PO TABS: 25.0000 mg | ORAL_TABLET | Freq: Every day | ORAL | 3 refills | Status: AC

## 2018-12-22 DIAGNOSIS — G250 Essential tremor: Secondary | ICD-10-CM | POA: Diagnosis not present

## 2018-12-22 DIAGNOSIS — J449 Chronic obstructive pulmonary disease, unspecified: Secondary | ICD-10-CM | POA: Diagnosis not present

## 2018-12-22 DIAGNOSIS — R296 Repeated falls: Secondary | ICD-10-CM | POA: Diagnosis not present

## 2019-01-21 DIAGNOSIS — G250 Essential tremor: Secondary | ICD-10-CM | POA: Diagnosis not present

## 2019-01-21 DIAGNOSIS — B356 Tinea cruris: Secondary | ICD-10-CM | POA: Diagnosis not present

## 2019-01-21 DIAGNOSIS — R296 Repeated falls: Secondary | ICD-10-CM | POA: Diagnosis not present

## 2019-01-21 DIAGNOSIS — J449 Chronic obstructive pulmonary disease, unspecified: Secondary | ICD-10-CM | POA: Diagnosis not present

## 2019-01-28 DIAGNOSIS — E1142 Type 2 diabetes mellitus with diabetic polyneuropathy: Secondary | ICD-10-CM | POA: Diagnosis not present

## 2019-04-11 ENCOUNTER — Other Ambulatory Visit: Payer: Self-pay

## 2019-04-11 ENCOUNTER — Emergency Department (HOSPITAL_COMMUNITY): Payer: Medicare Other

## 2019-04-11 ENCOUNTER — Encounter (HOSPITAL_COMMUNITY): Payer: Self-pay

## 2019-04-11 ENCOUNTER — Emergency Department (HOSPITAL_COMMUNITY)
Admission: EM | Admit: 2019-04-11 | Discharge: 2019-04-12 | Disposition: A | Discharge status: Home / Self Care | Payer: Medicare Other | Source: Home / Self Care | Attending: Emergency Medicine | Admitting: Emergency Medicine

## 2019-04-11 DIAGNOSIS — F0281 Dementia in other diseases classified elsewhere with behavioral disturbance: Secondary | ICD-10-CM | POA: Diagnosis not present

## 2019-04-11 DIAGNOSIS — Z887 Allergy status to serum and vaccine status: Secondary | ICD-10-CM | POA: Diagnosis not present

## 2019-04-11 DIAGNOSIS — E785 Hyperlipidemia, unspecified: Secondary | ICD-10-CM | POA: Diagnosis not present

## 2019-04-11 DIAGNOSIS — J440 Chronic obstructive pulmonary disease with acute lower respiratory infection: Secondary | ICD-10-CM | POA: Diagnosis not present

## 2019-04-11 DIAGNOSIS — Z8249 Family history of ischemic heart disease and other diseases of the circulatory system: Secondary | ICD-10-CM | POA: Diagnosis not present

## 2019-04-11 DIAGNOSIS — J1282 Pneumonia due to coronavirus disease 2019: Secondary | ICD-10-CM | POA: Diagnosis not present

## 2019-04-11 DIAGNOSIS — F05 Delirium due to known physiological condition: Secondary | ICD-10-CM | POA: Diagnosis not present

## 2019-04-11 DIAGNOSIS — F330 Major depressive disorder, recurrent, mild: Secondary | ICD-10-CM | POA: Diagnosis not present

## 2019-04-11 DIAGNOSIS — R410 Disorientation, unspecified: Secondary | ICD-10-CM | POA: Diagnosis not present

## 2019-04-11 DIAGNOSIS — R279 Unspecified lack of coordination: Secondary | ICD-10-CM | POA: Diagnosis not present

## 2019-04-11 DIAGNOSIS — Z87891 Personal history of nicotine dependence: Secondary | ICD-10-CM | POA: Diagnosis not present

## 2019-04-11 DIAGNOSIS — U071 COVID-19: Secondary | ICD-10-CM

## 2019-04-11 DIAGNOSIS — N183 Chronic kidney disease, stage 3 unspecified: Secondary | ICD-10-CM | POA: Insufficient documentation

## 2019-04-11 DIAGNOSIS — J9600 Acute respiratory failure, unspecified whether with hypoxia or hypercapnia: Secondary | ICD-10-CM | POA: Diagnosis not present

## 2019-04-11 DIAGNOSIS — Z781 Physical restraint status: Secondary | ICD-10-CM | POA: Diagnosis not present

## 2019-04-11 DIAGNOSIS — F0390 Unspecified dementia without behavioral disturbance: Secondary | ICD-10-CM | POA: Diagnosis not present

## 2019-04-11 DIAGNOSIS — I69354 Hemiplegia and hemiparesis following cerebral infarction affecting left non-dominant side: Secondary | ICD-10-CM | POA: Diagnosis not present

## 2019-04-11 DIAGNOSIS — I69320 Aphasia following cerebral infarction: Secondary | ICD-10-CM | POA: Diagnosis not present

## 2019-04-11 DIAGNOSIS — I4819 Other persistent atrial fibrillation: Secondary | ICD-10-CM | POA: Diagnosis not present

## 2019-04-11 DIAGNOSIS — M2550 Pain in unspecified joint: Secondary | ICD-10-CM | POA: Diagnosis not present

## 2019-04-11 DIAGNOSIS — J9601 Acute respiratory failure with hypoxia: Secondary | ICD-10-CM | POA: Diagnosis not present

## 2019-04-11 DIAGNOSIS — I509 Heart failure, unspecified: Secondary | ICD-10-CM | POA: Insufficient documentation

## 2019-04-11 DIAGNOSIS — I130 Hypertensive heart and chronic kidney disease with heart failure and stage 1 through stage 4 chronic kidney disease, or unspecified chronic kidney disease: Secondary | ICD-10-CM | POA: Insufficient documentation

## 2019-04-11 DIAGNOSIS — F411 Generalized anxiety disorder: Secondary | ICD-10-CM | POA: Diagnosis not present

## 2019-04-11 DIAGNOSIS — E119 Type 2 diabetes mellitus without complications: Secondary | ICD-10-CM | POA: Diagnosis not present

## 2019-04-11 DIAGNOSIS — R05 Cough: Secondary | ICD-10-CM | POA: Diagnosis not present

## 2019-04-11 DIAGNOSIS — Z7401 Bed confinement status: Secondary | ICD-10-CM | POA: Diagnosis not present

## 2019-04-11 DIAGNOSIS — Z794 Long term (current) use of insulin: Secondary | ICD-10-CM | POA: Diagnosis not present

## 2019-04-11 DIAGNOSIS — E1122 Type 2 diabetes mellitus with diabetic chronic kidney disease: Secondary | ICD-10-CM | POA: Diagnosis not present

## 2019-04-11 DIAGNOSIS — I482 Chronic atrial fibrillation, unspecified: Secondary | ICD-10-CM | POA: Diagnosis not present

## 2019-04-11 DIAGNOSIS — R404 Transient alteration of awareness: Secondary | ICD-10-CM | POA: Diagnosis not present

## 2019-04-11 DIAGNOSIS — F0280 Dementia in other diseases classified elsewhere without behavioral disturbance: Secondary | ICD-10-CM | POA: Diagnosis not present

## 2019-04-11 DIAGNOSIS — Z7901 Long term (current) use of anticoagulants: Secondary | ICD-10-CM | POA: Insufficient documentation

## 2019-04-11 DIAGNOSIS — G4733 Obstructive sleep apnea (adult) (pediatric): Secondary | ICD-10-CM | POA: Diagnosis not present

## 2019-04-11 DIAGNOSIS — R0902 Hypoxemia: Secondary | ICD-10-CM | POA: Diagnosis not present

## 2019-04-11 DIAGNOSIS — G309 Alzheimer's disease, unspecified: Secondary | ICD-10-CM | POA: Diagnosis not present

## 2019-04-11 DIAGNOSIS — R918 Other nonspecific abnormal finding of lung field: Secondary | ICD-10-CM | POA: Diagnosis not present

## 2019-04-11 DIAGNOSIS — E118 Type 2 diabetes mellitus with unspecified complications: Secondary | ICD-10-CM | POA: Diagnosis not present

## 2019-04-11 DIAGNOSIS — R0602 Shortness of breath: Secondary | ICD-10-CM | POA: Diagnosis not present

## 2019-04-11 DIAGNOSIS — R7401 Elevation of levels of liver transaminase levels: Secondary | ICD-10-CM | POA: Diagnosis present

## 2019-04-11 DIAGNOSIS — R4182 Altered mental status, unspecified: Secondary | ICD-10-CM | POA: Diagnosis not present

## 2019-04-11 DIAGNOSIS — I129 Hypertensive chronic kidney disease with stage 1 through stage 4 chronic kidney disease, or unspecified chronic kidney disease: Secondary | ICD-10-CM | POA: Diagnosis not present

## 2019-04-11 DIAGNOSIS — M6281 Muscle weakness (generalized): Secondary | ICD-10-CM | POA: Diagnosis not present

## 2019-04-11 DIAGNOSIS — Z7984 Long term (current) use of oral hypoglycemic drugs: Secondary | ICD-10-CM | POA: Insufficient documentation

## 2019-04-11 DIAGNOSIS — I69391 Dysphagia following cerebral infarction: Secondary | ICD-10-CM | POA: Diagnosis not present

## 2019-04-11 DIAGNOSIS — N184 Chronic kidney disease, stage 4 (severe): Secondary | ICD-10-CM | POA: Diagnosis not present

## 2019-04-11 DIAGNOSIS — Z79899 Other long term (current) drug therapy: Secondary | ICD-10-CM | POA: Insufficient documentation

## 2019-04-11 DIAGNOSIS — M1A9XX0 Chronic gout, unspecified, without tophus (tophi): Secondary | ICD-10-CM | POA: Diagnosis not present

## 2019-04-11 DIAGNOSIS — Z515 Encounter for palliative care: Secondary | ICD-10-CM | POA: Diagnosis not present

## 2019-04-11 DIAGNOSIS — J449 Chronic obstructive pulmonary disease, unspecified: Secondary | ICD-10-CM | POA: Insufficient documentation

## 2019-04-11 DIAGNOSIS — Z820 Family history of epilepsy and other diseases of the nervous system: Secondary | ICD-10-CM | POA: Diagnosis not present

## 2019-04-11 DIAGNOSIS — Z743 Need for continuous supervision: Secondary | ICD-10-CM | POA: Diagnosis not present

## 2019-04-11 DIAGNOSIS — Z9989 Dependence on other enabling machines and devices: Secondary | ICD-10-CM | POA: Diagnosis not present

## 2019-04-11 DIAGNOSIS — I959 Hypotension, unspecified: Secondary | ICD-10-CM | POA: Diagnosis not present

## 2019-04-11 LAB — CBC
HCT: 52.6 % — ABNORMAL HIGH (ref 36.0–46.0)
Hemoglobin: 17.1 g/dL — ABNORMAL HIGH (ref 12.0–15.0)
MCH: 29 pg (ref 26.0–34.0)
MCHC: 32.5 g/dL (ref 30.0–36.0)
MCV: 89.2 fL (ref 80.0–100.0)
Platelets: 157 10*3/uL (ref 150–400)
RBC: 5.9 MIL/uL — ABNORMAL HIGH (ref 3.87–5.11)
RDW: 17.2 % — ABNORMAL HIGH (ref 11.5–15.5)
WBC: 7.8 10*3/uL (ref 4.0–10.5)
nRBC: 0 % (ref 0.0–0.2)

## 2019-04-11 LAB — BASIC METABOLIC PANEL
Anion gap: 14 (ref 5–15)
BUN: 46 mg/dL — ABNORMAL HIGH (ref 8–23)
CO2: 26 mmol/L (ref 22–32)
Calcium: 9.5 mg/dL (ref 8.9–10.3)
Chloride: 93 mmol/L — ABNORMAL LOW (ref 98–111)
Creatinine, Ser: 1.41 mg/dL — ABNORMAL HIGH (ref 0.44–1.00)
GFR calc Af Amer: 39 mL/min — ABNORMAL LOW (ref >60)
GFR calc non Af Amer: 33 mL/min — ABNORMAL LOW (ref >60)
Glucose, Bld: 125 mg/dL — ABNORMAL HIGH (ref 70–99)
Potassium: 4.5 mmol/L (ref 3.5–5.1)
Sodium: 133 mmol/L — ABNORMAL LOW (ref 135–145)

## 2019-04-11 LAB — RESPIRATORY PANEL BY RT PCR (FLU A&B, COVID)
Influenza A by PCR: NEGATIVE
Influenza B by PCR: NEGATIVE
SARS Coronavirus 2 by RT PCR: POSITIVE — AB

## 2019-04-11 MED ORDER — SODIUM CHLORIDE 0.9 % IV BOLUS
500.0000 mL | Freq: Once | INTRAVENOUS | Status: AC
  Administered 2019-04-11: 500 mL via INTRAVENOUS

## 2019-04-11 NOTE — Discharge Instructions (Signed)
It was our pleasure to provide your ER care today - we hope that you feel better.  Your covid test is positive - see covid information/instructions.  Return to ER if worse, new symptoms, increased difficulty breathing, weak/faint, or other concern.

## 2019-04-11 NOTE — ED Provider Notes (Addendum)
[place] DEPT Provider Note   CSN: [id number] Arrival date & time: 04/11/19  1754     History Chief Complaint  Patient presents with  . Covid Positive  . Low 02 Sat    Kara Ortega is a 86 y.o. female.  Patient presents via EMS from [facility] with concern for possible covid infection. Patient was noted to appear sob, and ?room air pulse ox around 90%. Symptoms acute onset, mild, without specific exacerbating or alleviating factors, now improved. Per report, several others at [facility] w recent dx covid. Patient currently denies specific complaint. No fever/chills. No cough or sore throat. No headache. No chest pain or discomfort. No sob, feels breathing at baseline. No leg pain or swelling.   The history is provided by the patient and the EMS personnel.       Past Medical History:  Diagnosis Date  . A-fib ([place])   . Abnormal heart rhythms   . Alzheimer disease ([place])   . Anginal pain ([place])   . Back pain   . CHF (congestive heart failure) ([place])   . Chronic kidney disease    STAGE 3   . COPD (chronic obstructive pulmonary disease) ([place])   . Depression   . Diabetes ([place])    insulin dependent  . Hypertension   . Leg pain   . Memory loss   . Neuropathy   . Shortness of breath   . Sleep apnea    uses bipap X12 years.  . Stroke [facility])    LEFT SIDE RESIDUAL    Patient Active Problem List   Diagnosis Date Noted  . Essential tremor 09/14/2014  . High cholesterol 05/19/2014  . Persistent atrial fibrillation ([place]) 05/19/2014  . Long-term use of high-risk medication 05/19/2014  . Accident due to mechanical fall without injury 03/24/2014  . Diastolic dysfunction with chronic heart failure ([place]) 03/24/2014  . Sinus bradycardia 03/24/2014  . Hypotension 03/24/2014  . OSA (obstructive sleep apnea) 03/24/2014  . Warfarin anticoagulation 03/24/2014  . Aortic stenosis 03/24/2014  . Mitral stenosis 03/24/2014  . DOE (dyspnea on exertion) 03/24/2014  .  Recurrent falls 03/24/2014  . AKI (acute kidney injury) ([place]) 03/23/2014  . Alzheimer's dementia ([place]) 12/26/2013  . Unstable angina ([place]) 09/14/2013  . Diarrhea 09/14/2013  . Chest pain at rest- most likely GI related 09/14/2013  . CHF (congestive heart failure) ([place])   . A-fib ([place])   . Diabetes ([place])   . Dyspnea on exertion 03/25/2013    Past Surgical History:  Procedure Laterality Date  . BREAST LUMPECTOMY    . BREAST SURGERY     X4  . COLON SURGERY     removed 14 inches of colon  . HERNIA REPAIR       OB History   No obstetric history on file.     Family History  Problem Relation Age of Onset  . Dementia Maternal Grandmother   . Heart disease Mother   . Heart disease Sister   . Alzheimer's disease Sister   . Dementia Maternal Aunt   . Mental illness Other     Social History   Tobacco Use  . Smoking status: Former Smoker    Packs/day: 1.50    Years: 18.00    Pack years: 27.00    Types: Cigarettes    Quit date: 03/31/1968    Years since quitting: 51.0  . Smokeless tobacco: Never Used  Substance Use Topics  . Alcohol use: No    Alcohol/week: 0.0 standard  drinks  . Drug use: No    Home Medications Prior to Admission medications   Medication Sig Start Date End Date Taking? Authorizing Provider  acetaminophen (TYLENOL) 325 MG tablet Take 650 mg by mouth every 6 (six) hours as needed.    [provider]  allopurinol (ZYLOPRIM) 100 MG tablet Take 200 mg by mouth daily.     [provider]  ALPRAZolam Duanne Moron) 0.25 MG tablet Take 0.25 mg by mouth every 12 (twelve) hours as needed for anxiety or sleep.     [provider]  apixaban (ELIQUIS) 5 MG TABS tablet Take 1 tablet (5 mg total) by mouth 2 (two) times daily. 02/19/15   Isaiah Serge, NP  Ascorbic Acid (VITAMIN C) 1000 MG tablet Take 1,000 mg by mouth daily.    [provider]  atorvastatin (LIPITOR) 10 MG tablet Take 10 mg by mouth at bedtime.     [provider]  B-D UF III MINI PEN NEEDLES 31G X 5 MM MISC U UTD TO INJECT INSULIN 02/25/15   [provider]  Cholecalciferol (HM VITAMIN D3) 4000 UNITS CAPS Take 1,000 Units by mouth daily.     [provider]  citalopram (CELEXA) 20 MG tablet Take 20 mg by mouth daily.    [provider]  diclofenac sodium (VOLTAREN) 1 % GEL Apply 4 g topically 4 (four) times daily as needed (For pain.).     [provider]  diltiazem (DILACOR XR) 180 MG 24 hr capsule Take 1 capsule (180 mg total) by mouth daily. 04/22/17   Isaiah Serge, NP  docusate sodium (COLACE) 100 MG capsule Take 100 mg by mouth daily as needed for mild constipation.    [provider]  furosemide (LASIX) 20 MG tablet Take 20 mg by mouth daily.     [provider]  guaiFENesin (MUCINEX) 600 MG 12 hr tablet Take by mouth every 12 (twelve) hours as needed for cough or to loosen phlegm.    [provider]  hydrocortisone (ANUSOL-HC) 25 MG suppository Place 25 mg rectally 2 (two) times daily as needed for hemorrhoids.     [provider]  isosorbide mononitrate (IMDUR) 60 MG 24 hr tablet Take 1 tablet (60 mg total) by mouth daily. 04/27/17   Isaiah Serge, NP  ketoconazole (NIZORAL) 2 % cream Apply 1 application topically as needed. For rash 08/12/18   [provider]  Liraglutide (VICTOZA) 18 MG/3ML SOPN Inject 1.2 mg into the skin daily.     [provider]  loperamide (IMODIUM A-D) 2 MG tablet Take 2 mg by mouth 4 (four) times daily as needed for diarrhea or loose stools.    [provider]  Melatonin 5 MG TABS Take 10 mg by mouth at bedtime.    [provider]  methocarbamol (ROBAXIN) 500 MG tablet Take 500 mg by mouth every 6 (six) hours as needed (back pain).     [provider]  nitroGLYCERIN (NITROSTAT) 0.4 MG SL tablet Place 1 tablet (0.4 mg total) under the tongue every 5 (five) minutes as needed for chest pain. 05/19/14   Jerline Pain, MD  Polyethyl Glycol-Propyl Glycol (SYSTANE) 0.4-0.3 % SOLN Place 1 drop into both eyes 2 (two) times a day.    [provider]  pregabalin (LYRICA) 100 MG capsule Take 1 capsule (100 mg total) by mouth 2 (two) times daily. Patient taking differently: Take 100-200 mg by mouth 2 (two) times daily. Give  1 capsule (100mg ) in th am, then 2 capsules (200mg ) in the evening 07/25/16   Pollina, Gwenyth Allegra, MD  psyllium (METAMUCIL SMOOTH TEXTURE) 28 % packet Take 1 packet by mouth daily as needed (constipation).    [provider]  Rivastigmine 13.3 MG/24HR PT24 Place 1 patch (13.3 mg total) onto the skin daily. 04/27/15   Pieter Partridge, DO  spironolactone (ALDACTONE) 25 MG tablet Take 25 mg by mouth 2 (two) times daily.     [provider]  tiZANidine (ZANAFLEX) 2 MG tablet Take 2 mg by mouth every 6 (six) hours as needed for muscle spasms.    [provider]  topiramate (TOPAMAX) 25 MG tablet Take 1 tablet (25 mg total) by mouth at bedtime. 11/24/18   Jaffe, Adam R, DO  TOUJEO SOLOSTAR 300 UNIT/ML SOPN Inject 80 mg into the skin at bedtime.  05/22/14   [provider]  traMADol (ULTRAM) 50 MG tablet Take 25 mg by mouth every 8 (eight) hours as needed for moderate pain.     [provider]  vitamin B-12 (CYANOCOBALAMIN) 1000 MCG tablet Take 1,000 mcg by mouth daily.    [provider]    Allergies    Avandia [rosiglitazone], Benazepril, Dilaudid [hydromorphone hcl], Fosamax [alendronate sodium], Talwin [pentazocine], Tetanus toxoids, and Zyrtec [cetirizine]  Review of Systems   Review of Systems  Constitutional: Negative for fever.  HENT: Negative for sore throat.   Eyes: Negative for redness.  Respiratory: Negative for cough and shortness of breath.   Cardiovascular: Negative for chest pain.  Gastrointestinal: Negative for abdominal pain.  Genitourinary: Negative for flank pain.  Musculoskeletal: Negative for back pain and neck  pain.  Skin: Negative for rash.  Neurological: Negative for headaches.  Hematological: Does not bruise/bleed easily.  Psychiatric/Behavioral: Negative for confusion.    Physical Exam Updated Vital Signs BP (!) 115/57   Pulse 73   Temp 97.9 F (36.6 C) (Oral)   Resp 16   SpO2 96%   Physical Exam Vitals and nursing note reviewed.  Constitutional:      Appearance: Normal appearance. She is well-developed.  HENT:     Head: Atraumatic.     Nose: Nose normal.     Mouth/Throat:     Mouth: Mucous membranes are moist.  Eyes:     General: No scleral icterus.    Conjunctiva/sclera: Conjunctivae normal.  Neck:     Trachea: No tracheal deviation.  Cardiovascular:     Rate and Rhythm: Normal rate and regular rhythm.     Pulses: Normal pulses.     Heart sounds: Normal heart sounds. No murmur. No friction rub. No gallop.   Pulmonary:     Effort: Pulmonary effort is normal. No respiratory distress.     Breath sounds: Normal breath sounds.  Abdominal:     General: Bowel sounds are normal. There is no distension.     Palpations: Abdomen is soft.     Tenderness: There is no abdominal tenderness. There is no guarding.  Genitourinary:    Comments: No cva tenderness.  Musculoskeletal:        General: No swelling or tenderness.     Cervical back: Normal range of motion and neck supple. No rigidity. No muscular tenderness.  Skin:    General: Skin is warm and dry.     Findings: No rash.  Neurological:     Mental Status: She is alert.     Comments: Alert, speech normal.   Psychiatric:  Mood and Affect: Mood normal.     ED Results / Procedures / Treatments   Labs (all labs ordered are listed, but only abnormal results are displayed) Results for orders placed or performed during the hospital encounter of 04/11/19  Respiratory Panel by RT PCR (Flu A&B, Covid) - Nasopharyngeal Swab   Specimen: Nasopharyngeal Swab  Result Value Ref Range   SARS Coronavirus 2 by RT PCR POSITIVE  (A) NEGATIVE   Influenza A by PCR NEGATIVE NEGATIVE   Influenza B by PCR NEGATIVE NEGATIVE  CBC  Result Value Ref Range   WBC 7.8 4.0 - 10.5 K/uL   RBC 5.90 (H) 3.87 - 5.11 MIL/uL   Hemoglobin 17.1 (H) 12.0 - 15.0 g/dL   HCT 52.6 (H) 36.0 - 46.0 %   MCV 89.2 80.0 - 100.0 fL   MCH 29.0 26.0 - 34.0 pg   MCHC 32.5 30.0 - 36.0 g/dL   RDW 17.2 (H) 11.5 - 15.5 %   Platelets 157 150 - 400 K/uL   nRBC 0.0 0.0 - 0.2 %  BMET  Result Value Ref Range   Sodium 133 (L) 135 - 145 mmol/L   Potassium 4.5 3.5 - 5.1 mmol/L   Chloride 93 (L) 98 - 111 mmol/L   CO2 26 22 - 32 mmol/L   Glucose, Bld 125 (H) 70 - 99 mg/dL   BUN 46 (H) 8 - 23 mg/dL   Creatinine, Ser 1.41 (H) 0.44 - 1.00 mg/dL   Calcium 9.5 8.9 - 10.3 mg/dL   GFR calc non Af Amer 33 (L) >60 mL/min   GFR calc Af Amer 39 (L) >60 mL/min   Anion gap 14 5 - 15   XR Chest Portable  Result Date: 04/11/2019 CLINICAL DATA:  86 year old female with cough. EXAM: PORTABLE CHEST 1 VIEW COMPARISON:  Chest radiograph dated 03/23/2014. FINDINGS: Minimal left lung base linear and streaky densities, likely atelectasis/scarring. No focal consolidation, pleural effusion, or pneumothorax. An 8 mm faint hazy density in the left mid lung field may represent atelectasis. A pulmonary nodule or infiltrate is not excluded. Attention on follow-up imaging recommended. Stable cardiomediastinal silhouette. There is calcification of the mitral annulus as well as atherosclerotic calcification of the aorta. No acute osseous pathology. IMPRESSION: 1. Left lung base linear atelectasis/scarring. No focal consolidation. 2.  Aortic Atherosclerosis (ICD10-I70.0). Electronically Signed   By: Anner Crete M.D.   On: 04/11/2019 19:03    EKG None  Radiology XR Chest Portable  Result Date: 04/11/2019 CLINICAL DATA:  86 year old female with cough. EXAM: PORTABLE CHEST 1 VIEW COMPARISON:  Chest radiograph dated 03/23/2014. FINDINGS: Minimal left lung base linear and streaky  densities, likely atelectasis/scarring. No focal consolidation, pleural effusion, or pneumothorax. An 8 mm faint hazy density in the left mid lung field may represent atelectasis. A pulmonary nodule or infiltrate is not excluded. Attention on follow-up imaging recommended. Stable cardiomediastinal silhouette. There is calcification of the mitral annulus as well as atherosclerotic calcification of the aorta. No acute osseous pathology. IMPRESSION: 1. Left lung base linear atelectasis/scarring. No focal consolidation. 2.  Aortic Atherosclerosis (ICD10-I70.0). Electronically Signed   By: Anner Crete M.D.   On: 04/11/2019 19:03    Procedures Procedures (including critical care time)  Medications Ordered in ED Medications - No data to display  ED Course  I have reviewed the triage vital signs and the nursing notes.  Pertinent labs & imaging results that were available during my care of the patient were reviewed by me and considered in  my medical decision making (see chart for details).    MDM Rules/Calculators/A&P                     Iv ns. Continuous pulse ox and monitor.   Reviewed nursing notes and prior charts for additional history.   Labs reviewed/interpreted by me - wbc normal. Bun/cr sl inc from prior. 500 cc ns bolus. Po fluids.  Deah Otsuka was evaluated in Emergency Department on 04/11/2019 for the symptoms described in the history of present illness. She was evaluated in the context of the global COVID-19 pandemic, which necessitated consideration that the patient might be at risk for infection with the SARS-CoV-2 virus that causes COVID-19. Institutional protocols and algorithms that pertain to the evaluation of patients at risk for COVID-19 are in a state of rapid change based on information released by regulatory bodies including the CDC and federal and state organizations. These policies and algorithms were followed during the patient's care in the ED.  CXR  reviewed/interpreted by me - no pna.  Additional labs reviewed, covid is positive.   Recheck pt - room air pulse ox is 94-95% RA with no increased wob, and no subjective dyspnea. Patient requests d/c to her facility/home - stating she feels fine - indicates if we do not discharge her, she will walk out of here. Currently bp 115/59. Pulse 66. rr 16. Pulse ox 95% off of any o2.   Patient currently appears stable for d/c.   Return precautions provided.       Final Clinical Impression(s) / ED Diagnoses Final diagnoses:  None    Rx / DC Orders ED Discharge Orders    None              Lajean Saver, MD 04/11/19 2230

## 2019-04-11 NOTE — ED Notes (Signed)
[facility] called for transportation. Paperwork at nurses station. 

## 2019-04-12 NOTE — ED Notes (Signed)
PTAR at bedside 

## 2019-04-12 NOTE — ED Notes (Addendum)
Son Kara Ortega) was called to verify patients nursing facility.  Patients discharge papers went over with son. [place].

## 2019-04-13 ENCOUNTER — Other Ambulatory Visit: Payer: Self-pay

## 2019-04-13 ENCOUNTER — Encounter (HOSPITAL_COMMUNITY): Payer: Self-pay | Admitting: Emergency Medicine

## 2019-04-13 ENCOUNTER — Inpatient Hospital Stay (HOSPITAL_COMMUNITY)
Admission: EM | Admit: 2019-04-13 | Discharge: 2019-04-21 | DRG: 177 | Disposition: A | Discharge status: Skilled Nursing Facility | Payer: Medicare Other | Attending: Internal Medicine | Admitting: Internal Medicine

## 2019-04-13 ENCOUNTER — Emergency Department (HOSPITAL_COMMUNITY): Payer: Medicare Other

## 2019-04-13 DIAGNOSIS — U071 COVID-19: Secondary | ICD-10-CM | POA: Diagnosis present

## 2019-04-13 DIAGNOSIS — E119 Type 2 diabetes mellitus without complications: Secondary | ICD-10-CM | POA: Diagnosis not present

## 2019-04-13 DIAGNOSIS — Z887 Allergy status to serum and vaccine status: Secondary | ICD-10-CM

## 2019-04-13 DIAGNOSIS — N184 Chronic kidney disease, stage 4 (severe): Secondary | ICD-10-CM | POA: Diagnosis present

## 2019-04-13 DIAGNOSIS — G309 Alzheimer's disease, unspecified: Secondary | ICD-10-CM | POA: Diagnosis present

## 2019-04-13 DIAGNOSIS — Z794 Long term (current) use of insulin: Secondary | ICD-10-CM | POA: Diagnosis not present

## 2019-04-13 DIAGNOSIS — Z79899 Other long term (current) drug therapy: Secondary | ICD-10-CM | POA: Diagnosis not present

## 2019-04-13 DIAGNOSIS — J9601 Acute respiratory failure with hypoxia: Secondary | ICD-10-CM | POA: Diagnosis present

## 2019-04-13 DIAGNOSIS — Z781 Physical restraint status: Secondary | ICD-10-CM

## 2019-04-13 DIAGNOSIS — F0281 Dementia in other diseases classified elsewhere with behavioral disturbance: Secondary | ICD-10-CM | POA: Diagnosis present

## 2019-04-13 DIAGNOSIS — I69354 Hemiplegia and hemiparesis following cerebral infarction affecting left non-dominant side: Secondary | ICD-10-CM | POA: Diagnosis not present

## 2019-04-13 DIAGNOSIS — E785 Hyperlipidemia, unspecified: Secondary | ICD-10-CM | POA: Diagnosis present

## 2019-04-13 DIAGNOSIS — R7401 Elevation of levels of liver transaminase levels: Secondary | ICD-10-CM | POA: Diagnosis present

## 2019-04-13 DIAGNOSIS — F05 Delirium due to known physiological condition: Secondary | ICD-10-CM | POA: Diagnosis present

## 2019-04-13 DIAGNOSIS — J440 Chronic obstructive pulmonary disease with acute lower respiratory infection: Secondary | ICD-10-CM | POA: Diagnosis present

## 2019-04-13 DIAGNOSIS — E1122 Type 2 diabetes mellitus with diabetic chronic kidney disease: Secondary | ICD-10-CM | POA: Diagnosis present

## 2019-04-13 DIAGNOSIS — Z820 Family history of epilepsy and other diseases of the nervous system: Secondary | ICD-10-CM | POA: Diagnosis not present

## 2019-04-13 DIAGNOSIS — J9600 Acute respiratory failure, unspecified whether with hypoxia or hypercapnia: Secondary | ICD-10-CM | POA: Diagnosis present

## 2019-04-13 DIAGNOSIS — Z9989 Dependence on other enabling machines and devices: Secondary | ICD-10-CM

## 2019-04-13 DIAGNOSIS — R0902 Hypoxemia: Secondary | ICD-10-CM

## 2019-04-13 DIAGNOSIS — J1282 Pneumonia due to coronavirus disease 2019: Secondary | ICD-10-CM | POA: Diagnosis present

## 2019-04-13 DIAGNOSIS — F0390 Unspecified dementia without behavioral disturbance: Secondary | ICD-10-CM | POA: Diagnosis not present

## 2019-04-13 DIAGNOSIS — Z87891 Personal history of nicotine dependence: Secondary | ICD-10-CM | POA: Diagnosis not present

## 2019-04-13 DIAGNOSIS — R410 Disorientation, unspecified: Secondary | ICD-10-CM | POA: Diagnosis not present

## 2019-04-13 DIAGNOSIS — I959 Hypotension, unspecified: Secondary | ICD-10-CM | POA: Diagnosis present

## 2019-04-13 DIAGNOSIS — I129 Hypertensive chronic kidney disease with stage 1 through stage 4 chronic kidney disease, or unspecified chronic kidney disease: Secondary | ICD-10-CM | POA: Diagnosis present

## 2019-04-13 DIAGNOSIS — I482 Chronic atrial fibrillation, unspecified: Secondary | ICD-10-CM | POA: Diagnosis not present

## 2019-04-13 DIAGNOSIS — F0280 Dementia in other diseases classified elsewhere without behavioral disturbance: Secondary | ICD-10-CM | POA: Diagnosis not present

## 2019-04-13 DIAGNOSIS — G4733 Obstructive sleep apnea (adult) (pediatric): Secondary | ICD-10-CM | POA: Diagnosis present

## 2019-04-13 DIAGNOSIS — Z515 Encounter for palliative care: Secondary | ICD-10-CM

## 2019-04-13 DIAGNOSIS — Z7901 Long term (current) use of anticoagulants: Secondary | ICD-10-CM | POA: Diagnosis not present

## 2019-04-13 DIAGNOSIS — Z888 Allergy status to other drugs, medicaments and biological substances status: Secondary | ICD-10-CM

## 2019-04-13 DIAGNOSIS — I4819 Other persistent atrial fibrillation: Secondary | ICD-10-CM | POA: Diagnosis present

## 2019-04-13 DIAGNOSIS — Z8249 Family history of ischemic heart disease and other diseases of the circulatory system: Secondary | ICD-10-CM

## 2019-04-13 LAB — LACTATE DEHYDROGENASE: LDH: 205 U/L — ABNORMAL HIGH (ref 98–192)

## 2019-04-13 LAB — CBC WITH DIFFERENTIAL/PLATELET
Abs Immature Granulocytes: 0.03 10*3/uL (ref 0.00–0.07)
Basophils Absolute: 0 10*3/uL (ref 0.0–0.1)
Basophils Relative: 0 %
Eosinophils Absolute: 0 10*3/uL (ref 0.0–0.5)
Eosinophils Relative: 0 %
HCT: 49.7 % — ABNORMAL HIGH (ref 36.0–46.0)
Hemoglobin: 16 g/dL — ABNORMAL HIGH (ref 12.0–15.0)
Immature Granulocytes: 0 %
Lymphocytes Relative: 15 %
Lymphs Abs: 1 10*3/uL (ref 0.7–4.0)
MCH: 28.4 pg (ref 26.0–34.0)
MCHC: 32.2 g/dL (ref 30.0–36.0)
MCV: 88.3 fL (ref 80.0–100.0)
Monocytes Absolute: 0.7 10*3/uL (ref 0.1–1.0)
Monocytes Relative: 10 %
Neutro Abs: 5.2 10*3/uL (ref 1.7–7.7)
Neutrophils Relative %: 75 %
Platelets: 134 10*3/uL — ABNORMAL LOW (ref 150–400)
RBC: 5.63 MIL/uL — ABNORMAL HIGH (ref 3.87–5.11)
RDW: 16.6 % — ABNORMAL HIGH (ref 11.5–15.5)
WBC: 7 10*3/uL (ref 4.0–10.5)
nRBC: 0 % (ref 0.0–0.2)

## 2019-04-13 LAB — BASIC METABOLIC PANEL
Anion gap: 12 (ref 5–15)
BUN: 40 mg/dL — ABNORMAL HIGH (ref 8–23)
CO2: 28 mmol/L (ref 22–32)
Calcium: 9.1 mg/dL (ref 8.9–10.3)
Chloride: 96 mmol/L — ABNORMAL LOW (ref 98–111)
Creatinine, Ser: 1.35 mg/dL — ABNORMAL HIGH (ref 0.44–1.00)
GFR calc Af Amer: 41 mL/min — ABNORMAL LOW (ref >60)
GFR calc non Af Amer: 35 mL/min — ABNORMAL LOW (ref >60)
Glucose, Bld: 83 mg/dL (ref 70–99)
Potassium: 3.6 mmol/L (ref 3.5–5.1)
Sodium: 136 mmol/L (ref 135–145)

## 2019-04-13 LAB — FIBRINOGEN: Fibrinogen: 594 mg/dL — ABNORMAL HIGH (ref 210–475)

## 2019-04-13 LAB — ABO/RH: ABO/RH(D): O POS

## 2019-04-13 LAB — PROCALCITONIN: Procalcitonin: 0.12 ng/mL

## 2019-04-13 LAB — C-REACTIVE PROTEIN: CRP: 0.8 mg/dL (ref <1.0)

## 2019-04-13 LAB — D-DIMER, QUANTITATIVE: D-Dimer, Quant: 0.95 ug/mL-FEU — ABNORMAL HIGH (ref 0.00–0.50)

## 2019-04-13 LAB — FERRITIN: Ferritin: 226 ng/mL (ref 11–307)

## 2019-04-13 MED ORDER — BUDESONIDE 180 MCG/ACT IN AEPB
2.0000 | INHALATION_SPRAY | Freq: Two times a day (BID) | RESPIRATORY_TRACT | Status: DC
  Administered 2019-04-13 – 2019-04-21 (×15): 2 via RESPIRATORY_TRACT
  Filled 2019-04-13 (×2): qty 1

## 2019-04-13 MED ORDER — SODIUM CHLORIDE 0.9 % IV SOLN
200.0000 mg | Freq: Once | INTRAVENOUS | Status: AC
  Administered 2019-04-13: 21:00:00 200 mg via INTRAVENOUS
  Filled 2019-04-13: qty 200

## 2019-04-13 MED ORDER — VITAMIN D3 25 MCG (1000 UNIT) PO TABS
4000.0000 [IU] | ORAL_TABLET | Freq: Every day | ORAL | Status: DC
  Administered 2019-04-14 – 2019-04-21 (×8): 4000 [IU] via ORAL
  Filled 2019-04-13 (×15): qty 4

## 2019-04-13 MED ORDER — INSULIN LISPRO 100 UNIT/ML ~~LOC~~ SOLN: 16.0000 [IU] | Freq: Once | SUBCUTANEOUS | Status: DC

## 2019-04-13 MED ORDER — ASCORBIC ACID 500 MG PO TABS
1000.0000 mg | ORAL_TABLET | Freq: Every day | ORAL | Status: DC
  Administered 2019-04-14 – 2019-04-21 (×8): 1000 mg via ORAL
  Filled 2019-04-13 (×8): qty 2

## 2019-04-13 MED ORDER — SODIUM CHLORIDE 0.9 % IV SOLN
500.0000 mg | INTRAVENOUS | Status: DC
  Administered 2019-04-13 – 2019-04-14 (×2): 500 mg via INTRAVENOUS
  Filled 2019-04-13 (×2): qty 500

## 2019-04-13 MED ORDER — RIVASTIGMINE 13.3 MG/24HR TD PT24
13.3000 mg | MEDICATED_PATCH | TRANSDERMAL | Status: DC
  Administered 2019-04-14 – 2019-04-21 (×8): 13.3 mg via TRANSDERMAL
  Filled 2019-04-13 (×9): qty 1

## 2019-04-13 MED ORDER — CITALOPRAM HYDROBROMIDE 10 MG PO TABS
20.0000 mg | ORAL_TABLET | Freq: Every day | ORAL | Status: DC
  Administered 2019-04-14 – 2019-04-21 (×8): 20 mg via ORAL
  Filled 2019-04-13 (×8): qty 2

## 2019-04-13 MED ORDER — ALLOPURINOL 100 MG PO TABS
200.0000 mg | ORAL_TABLET | Freq: Every day | ORAL | Status: DC
  Administered 2019-04-14 – 2019-04-21 (×8): 200 mg via ORAL
  Filled 2019-04-13 (×8): qty 2

## 2019-04-13 MED ORDER — DILTIAZEM HCL ER 180 MG PO CP24
180.0000 mg | ORAL_CAPSULE | Freq: Every day | ORAL | Status: DC
  Filled 2019-04-13: qty 1

## 2019-04-13 MED ORDER — ZINC SULFATE 220 (50 ZN) MG PO CAPS
220.0000 mg | ORAL_CAPSULE | Freq: Every day | ORAL | Status: DC
  Administered 2019-04-13 – 2019-04-21 (×9): 220 mg via ORAL
  Filled 2019-04-13 (×8): qty 1

## 2019-04-13 MED ORDER — SODIUM CHLORIDE 0.9 % IV SOLN
1.0000 g | INTRAVENOUS | Status: DC
  Administered 2019-04-13 – 2019-04-14 (×2): 1 g via INTRAVENOUS
  Filled 2019-04-13 (×2): qty 10

## 2019-04-13 MED ORDER — ATORVASTATIN CALCIUM 10 MG PO TABS
10.0000 mg | ORAL_TABLET | Freq: Every day | ORAL | Status: DC
  Administered 2019-04-13 – 2019-04-20 (×8): 10 mg via ORAL
  Filled 2019-04-13 (×10): qty 1

## 2019-04-13 MED ORDER — ISOSORBIDE MONONITRATE ER 30 MG PO TB24
60.0000 mg | ORAL_TABLET | Freq: Every day | ORAL | Status: DC
  Administered 2019-04-14 – 2019-04-17 (×4): 60 mg via ORAL
  Filled 2019-04-13: qty 1
  Filled 2019-04-13 (×3): qty 2

## 2019-04-13 MED ORDER — SODIUM CHLORIDE 0.9 % IV SOLN
100.0000 mg | Freq: Every day | INTRAVENOUS | Status: AC
  Administered 2019-04-14 – 2019-04-17 (×4): 100 mg via INTRAVENOUS
  Filled 2019-04-13: qty 20
  Filled 2019-04-13: qty 100
  Filled 2019-04-13 (×2): qty 20

## 2019-04-13 MED ORDER — VITAMIN B-12 1000 MCG PO TABS
1000.0000 ug | ORAL_TABLET | Freq: Every day | ORAL | Status: DC
  Administered 2019-04-14 – 2019-04-21 (×8): 1000 ug via ORAL
  Filled 2019-04-13 (×8): qty 1

## 2019-04-13 MED ORDER — MELATONIN 5 MG PO TABS
10.0000 mg | ORAL_TABLET | Freq: Every day | ORAL | Status: DC
  Administered 2019-04-13 – 2019-04-20 (×8): 10 mg via ORAL
  Filled 2019-04-13 (×11): qty 2

## 2019-04-13 MED ORDER — APIXABAN 5 MG PO TABS
5.0000 mg | ORAL_TABLET | Freq: Two times a day (BID) | ORAL | Status: DC
  Administered 2019-04-13 – 2019-04-21 (×16): 5 mg via ORAL
  Filled 2019-04-13 (×18): qty 1

## 2019-04-13 MED ORDER — DEXAMETHASONE 6 MG PO TABS
6.0000 mg | ORAL_TABLET | Freq: Every day | ORAL | Status: DC
  Administered 2019-04-13 – 2019-04-15 (×3): 6 mg via ORAL
  Filled 2019-04-13 (×3): qty 1

## 2019-04-13 NOTE — ED Provider Notes (Signed)
Care assumed from [facility], PA-C, at shift change, please see their notes for full documentation of patient's complaint/HPI. Briefly, pt here with hypoxia and COVID positive. Awaiting admission labs and CXR. Plan is to admit.   Physical Exam  BP (!) 123/59 (BP Location: Left Arm)   Pulse 66   Temp (!) 100.8 F (38.2 C) (Oral)   Resp (!) 25   SpO2 95%   Physical Exam  ED Course/Procedures     Procedures  MDM  Pt currently on 4L oxygen. She was 89% on RA on arrival to the ED. Will call hospitalist for admission. Labwork consistent with COVID 19 infection. Creatinine stable compared to baseline. No leukocytosis. Hgb stable. CXR consistent with COVID pneumonia.   Discussed case with hospitalist Dr. Junious Dresser who agrees to accept patient for admission.        Eustaquio Maize, PA-C 04/13/19 2334    Carmin Muskrat, MD 04/13/19 [phone]

## 2019-04-13 NOTE — H&P (Signed)
Kara Ortega is an 86 y.o. female.   Chief Complaint: Acute shortness of breath HPI: 86 year old female with past medical history of dementia, Alzheimer type, chronic kidney disease stage IV, chronic obstructive pulmonary disease, diabetes mellitus type 2, hypertension, previous stroke with left-sided hemiparesis, who presented with acute onset of shortness of breath from nursing facility. Patient was seen at the ED last 3 days and diagnosed with COVID-19 virus infection without any respiratory symptoms and discharged back to nursing facility. Today patient developed acute onset of shortness of breath. Patient is fully alert and only oriented to person.  Has significant cognitive impairment from Alzheimer dementia. History was obtained from medical records and ED provider. No reported fever, chills, nausea, vomiting or diarrhea and patient denies same. In the ED, patient was found to be tachypneic with respiratory rate 27, She was saturating in the high 80s, requiring 4 L O2 by nasal cannula to maintain saturation greater than 90%. Chest x-ray showed subtle hazy airspace opacity in the mid to lower lung consistent with multifocal Covid 19 pneumonia. Lab was significant for elevation of inflammatory markers. EKG showed no acute ST-T wave changes. Hospitalist team was asked to see and admit patient for further management. Patient will be admitted alcohol withdrawal and started on remdesivir and dexamethasone. I will start her on empiric ceftriaxone and azithromycin for possible secondary bacterial superinfection. Expected length of stay is more than 2 nights.     Past Medical History:  Diagnosis Date  . A-fib ([place])   . Abnormal heart rhythms   . Alzheimer disease ([place])   . Anginal pain ([place])   . Back pain   . CHF (congestive heart failure) ([place])   . Chronic kidney disease    STAGE 3   . COPD (chronic obstructive pulmonary disease) ([place])   . Depression   . Diabetes ([place])    insulin  dependent  . Hypertension   . Leg pain   . Memory loss   . Neuropathy   . Shortness of breath   . Sleep apnea    uses bipap X12 years.  . Stroke ([place])    LEFT SIDE RESIDUAL    Past Surgical History:  Procedure Laterality Date  . BREAST LUMPECTOMY    . BREAST SURGERY     X4  . COLON SURGERY     removed 14 inches of colon  . HERNIA REPAIR      Family History  Problem Relation Age of Onset  . Dementia Maternal Grandmother   . Heart disease Mother   . Heart disease Sister   . Alzheimer's disease Sister   . Dementia Maternal Aunt   . Mental illness Other    Social History:  reports that she quit smoking about 51 years ago. Her smoking use included cigarettes. She has a 27.00 pack-year smoking history. She has never used smokeless tobacco. She reports that she does not drink alcohol or use drugs.  Allergies:  Allergies  Allergen Reactions  . Avandia [Rosiglitazone] Other (See Comments)    edema  . Benazepril Other (See Comments)    unknown  . Dilaudid [Hydromorphone Hcl] Other (See Comments)    "does not tolerate strong pain medications" per pt  . Fosamax [Alendronate Sodium] Nausea And Vomiting  . Talwin [Pentazocine] Nausea And Vomiting  . Tetanus Toxoids Other (See Comments)    "allergic to something in the tetanus injection" per patient  . Zyrtec [Cetirizine] Other (See Comments)    unknown    (Not in a  hospital admission)   Results for orders placed or performed during the hospital encounter of 04/13/19 (from the past 48 hour(s))  Basic metabolic panel     Status: Abnormal   Collection Time: 04/13/19  2:57 PM  Result Value Ref Range   Sodium 136 135 - 145 mmol/L   Potassium 3.6 3.5 - 5.1 mmol/L    Comment: DELTA CHECK NOTED   Chloride 96 (L) 98 - 111 mmol/L   CO2 28 22 - 32 mmol/L   Glucose, Bld 83 70 - 99 mg/dL   BUN 40 (H) 8 - 23 mg/dL   Creatinine, Ser 1.35 (H) 0.44 - 1.00 mg/dL   Calcium 9.1 8.9 - 10.3 mg/dL   GFR calc non Af Amer 35 (L) >60 mL/min    GFR calc Af Amer 41 (L) >60 mL/min   Anion gap 12 5 - 15    Comment: Performed at [facility], [place] [address], [place], [place] [address]  CBC with Differential     Status: Abnormal   Collection Time: 04/13/19  2:57 PM  Result Value Ref Range   WBC 7.0 4.0 - 10.5 K/uL   RBC 5.63 (H) 3.87 - 5.11 MIL/uL   Hemoglobin 16.0 (H) 12.0 - 15.0 g/dL   HCT 49.7 (H) 36.0 - 46.0 %   MCV 88.3 80.0 - 100.0 fL   MCH 28.4 26.0 - 34.0 pg   MCHC 32.2 30.0 - 36.0 g/dL   RDW 16.6 (H) 11.5 - 15.5 %   Platelets 134 (L) 150 - 400 K/uL   nRBC 0.0 0.0 - 0.2 %   Neutrophils Relative % 75 %   Neutro Abs 5.2 1.7 - 7.7 K/uL   Lymphocytes Relative 15 %   Lymphs Abs 1.0 0.7 - 4.0 K/uL   Monocytes Relative 10 %   Monocytes Absolute 0.7 0.1 - 1.0 K/uL   Eosinophils Relative 0 %   Eosinophils Absolute 0.0 0.0 - 0.5 K/uL   Basophils Relative 0 %   Basophils Absolute 0.0 0.0 - 0.1 K/uL   Immature Granulocytes 0 %   Abs Immature Granulocytes 0.03 0.00 - 0.07 K/uL    Comment: Performed at [facility], [place] [address] [place], [place] [address]  ABO/Rh     Status: None   Collection Time: 04/13/19  2:57 PM  Result Value Ref Range   ABO/RH(D)      O POS Performed at [facility], [place] [address] [place], [place] [address]   D-dimer, quantitative (not at [facility])     Status: Abnormal   Collection Time: 04/13/19  2:57 PM  Result Value Ref Range   D-Dimer, Quant 0.95 (H) 0.00 - 0.50 ug/mL-FEU    Comment: (NOTE) At the manufacturer cut-off of 0.50 ug/mL FEU, this assay has been documented to exclude PE with a sensitivity and negative predictive value of 97 to 99%.  At this time, this assay has not been approved by the FDA to exclude DVT/VTE. Results should be correlated with clinical presentation. Performed at [facility], [place] [address] [place], [place] [address]   Fibrinogen     Status: Abnormal   Collection Time: 04/13/19  2:57 PM   Result Value Ref Range   Fibrinogen 594 (H) 210 - 475 mg/dL    Comment: Performed at [facility], [place] [address], [place], [place] [address]  Lactate dehydrogenase     Status: Abnormal   Collection Time: 04/13/19  2:57 PM  Result  Value Ref Range   LDH 205 (H) 98 - 192 U/L    Comment: Performed at [facility], [place] [address], [place],  [address]  Procalcitonin     Status: None   Collection Time: 04/13/19  2:57 PM  Result Value Ref Range   Procalcitonin 0.12 ng/mL    Comment:        Interpretation: PCT (Procalcitonin) <= 0.5 ng/mL: Systemic infection (sepsis) is not likely. Local bacterial infection is possible. (NOTE)       Sepsis PCT Algorithm           Lower Respiratory Tract                                      Infection PCT Algorithm    ----------------------------     ----------------------------         PCT < 0.25 ng/mL                PCT < 0.10 ng/mL         Strongly encourage             Strongly discourage   discontinuation of antibiotics    initiation of antibiotics    ----------------------------     -----------------------------       PCT 0.25 - 0.50 ng/mL            PCT 0.10 - 0.25 ng/mL               OR       >80% decrease in PCT            Discourage initiation of                                            antibiotics      Encourage discontinuation           of antibiotics    ----------------------------     -----------------------------         PCT >= 0.50 ng/mL              PCT 0.26 - 0.50 ng/mL               AND        <80% decrease in PCT             Encourage initiation of                                             antibiotics       Encourage continuation           of antibiotics    ----------------------------     -----------------------------        PCT >= 0.50 ng/mL                  PCT > 0.50 ng/mL               AND         increase in PCT                  Strongly encourage  initiation of antibiotics    Strongly encourage escalation           of antibiotics                                     -----------------------------                                           PCT <= 0.25 ng/mL                                                 OR                                        > 80% decrease in PCT                                     Discontinue / Do not initiate                                             antibiotics Performed at [place] [address], [place], [place] [address]   Ferritin     Status: None   Collection Time: 04/13/19  2:57 PM  Result Value Ref Range   Ferritin 226 11 - 307 ng/mL    Comment: Performed at [facility], [place] [address], [place], [place] [address]  C-reactive protein     Status: None   Collection Time: 04/13/19  2:57 PM  Result Value Ref Range   CRP 0.8 <1.0 mg/dL    Comment: Performed at [facility], [place] [address], [place], [place] [address]   DG Chest Port 1 View  Result Date: 04/13/2019 CLINICAL DATA:  Sob, covid +, hypoxic EXAM: PORTABLE CHEST 1 VIEW COMPARISON:  04/11/2019 FINDINGS: Since the prior study, subtle hazy airspace opacity become apparent left mid and both lower lungs. Cardiac silhouette is normal in size. No convincing mediastinal hilar masses. No pleural effusion or pneumothorax. Skeletal structures are grossly intact. IMPRESSION: 1. Subtle hazy airspace opacities in the mid to lower lungs consistent with multifocal COVID-19 pneumonia. Electronically Signed   By: Lajean Manes M.D.   On: 04/13/2019 16:15   XR Chest Portable  Result Date: 04/11/2019 CLINICAL DATA:  86 year old female with cough. EXAM: PORTABLE CHEST 1 VIEW COMPARISON:  Chest radiograph dated 03/23/2014. FINDINGS: Minimal left lung base linear and streaky densities, likely atelectasis/scarring. No focal consolidation, pleural effusion, or pneumothorax. An 8 mm faint hazy density in  the left mid lung field may represent atelectasis. A pulmonary nodule or infiltrate is not excluded. Attention on follow-up imaging recommended. Stable cardiomediastinal silhouette. There is calcification of the mitral annulus as well as atherosclerotic calcification of the aorta. No acute osseous pathology. IMPRESSION: 1. Left lung base linear atelectasis/scarring. No focal consolidation. 2.  Aortic Atherosclerosis (ICD10-I70.0). Electronically Signed   By: Milas Hock  Radparvar M.D.   On: 04/11/2019 19:03    Review of Systems  Constitutional: Negative.   HENT: Negative.   Respiratory: Positive for shortness of breath and wheezing.   Cardiovascular: Negative.   Gastrointestinal: Negative.   Endocrine: Negative.   Genitourinary: Negative.   Musculoskeletal: Negative.   Skin: Negative.   Allergic/Immunologic: Negative.   Neurological: Negative.   Hematological: Negative.   Psychiatric/Behavioral: Negative.     Blood pressure (!) 127/57, pulse 72, temperature 98.9 F (37.2 C), temperature source Oral, resp. rate (!) 27, SpO2 94 %. Physical Exam  Constitutional: She appears well-developed and well-nourished.  HENT:  Head: Normocephalic and atraumatic.  Eyes: Pupils are equal, round, and reactive to light.  Cardiovascular: Normal rate, regular rhythm, normal heart sounds and intact distal pulses. Exam reveals no gallop.  Respiratory: She is in respiratory distress. She has wheezes.  GI: Soft. Bowel sounds are normal. There is no abdominal tenderness. There is no rebound and no guarding.  Musculoskeletal:        General: Deformity present. No edema.     Cervical back: Normal range of motion and neck supple.  Neurological: She is alert.  Left hemiparesis.  Oriented x1  Skin: Skin is warm and dry. No erythema.  Psychiatric: She has a normal mood and affect. Her behavior is normal.     Assessment/Plan Clinical problems list 1.  Acute hypoxic respiratory failure secondary to pneumonia due  to COVID-19 virus infection 2.  Dementia, Alzheimer type 3.  Diabetes mellitus type 2 4.  Essential hypertension  5.  Previous CVA with left hemiparesis   1.  Acute hypoxic respiratory failure secondary to pneumonia due to COVID-19 virus infection.  Acute shortness of breath with hypoxia and chest x-ray finding concerning for multifocal pneumonia Will initiate remdesivir and dexamethasone Start empiric antibiotics ceftriaxone and azithromycin for possible secondary bacterial superinfection Initiate bronchodilators Continue with oxygen supplementation and wean as tolerated  2.  Dementia, Alzheimer type Continue with Exelon  3.  Diabetes mellitus type 2 Continue with sliding scale insulin Initiate basal insulin Will add mealtime insulin to cover for steroid-induced hyperglycemia Fingersticks before meals and at bedtime Hypoglycemic protocol  4.  Essential hypertension.  Blood pressure Continue wit home blood pressure monitoring  5.  Previous CVA with left hemiparesis Continue with secondary stroke prevention with aspirin and statin  DVT prophylaxis: Lovenox subcu GI prophylaxis: Diabetic/cardiac diet Disposition: More than 2 nights  Total time spent: 75 minutes  Elie Confer, MD 04/13/2019, 5:45 PM

## 2019-04-13 NOTE — ED Notes (Signed)
Spoke with Dr. Myna Hidalgo about bed request. He states he is going to look into placing an order for a bed request.

## 2019-04-13 NOTE — ED Provider Notes (Signed)
[facility] DEPT Provider Note   CSN: [id number] Arrival date & time: 04/13/19  1412     History Chief Complaint  Patient presents with  . hypoxia  . Covid positive    Kara Ortega is a 86 y.o. female.  HPI Patient presents to the emergency department with pulse oximetry at the nursing facility.  The patient was diagnosed with Covid several days ago.  She does not have any complaints.  The patient states that she just feels tired.  The patient denies chest pain, shortness of breath, headache,blurred vision, neck pain, fever, cough, weakness, numbness, dizziness, anorexia, edema, abdominal pain, nausea, vomiting, diarrhea, rash, back pain, dysuria, hematemesis, bloody stool, near syncope, or syncope.    Past Medical History:  Diagnosis Date  . A-fib ([place])   . Abnormal heart rhythms   . Alzheimer disease ([place])   . Anginal pain ([place])   . Back pain   . CHF (congestive heart failure) ([place])   . Chronic kidney disease    STAGE 3   . COPD (chronic obstructive pulmonary disease) ([place])   . Depression   . Diabetes ([place])    insulin dependent  . Hypertension   . Leg pain   . Memory loss   . Neuropathy   . Shortness of breath   . Sleep apnea    uses bipap X12 years.  . Stroke [facility])    LEFT SIDE RESIDUAL    Patient Active Problem List   Diagnosis Date Noted  . Essential tremor 09/14/2014  . High cholesterol 05/19/2014  . Persistent atrial fibrillation ([place]) 05/19/2014  . Long-term use of high-risk medication 05/19/2014  . Accident due to mechanical fall without injury 03/24/2014  . Diastolic dysfunction with chronic heart failure ([place]) 03/24/2014  . Sinus bradycardia 03/24/2014  . Hypotension 03/24/2014  . OSA (obstructive sleep apnea) 03/24/2014  . Warfarin anticoagulation 03/24/2014  . Aortic stenosis 03/24/2014  . Mitral stenosis 03/24/2014  . DOE (dyspnea on exertion) 03/24/2014  . Recurrent falls 03/24/2014  . AKI (acute kidney  injury) ([place]) 03/23/2014  . Alzheimer's dementia ([place]) 12/26/2013  . Unstable angina ([place]) 09/14/2013  . Diarrhea 09/14/2013  . Chest pain at rest- most likely GI related 09/14/2013  . CHF (congestive heart failure) ([place])   . A-fib ([place])   . Diabetes ([place])   . Dyspnea on exertion 03/25/2013    Past Surgical History:  Procedure Laterality Date  . BREAST LUMPECTOMY    . BREAST SURGERY     X4  . COLON SURGERY     removed 14 inches of colon  . HERNIA REPAIR       OB History   No obstetric history on file.     Family History  Problem Relation Age of Onset  . Dementia Maternal Grandmother   . Heart disease Mother   . Heart disease Sister   . Alzheimer's disease Sister   . Dementia Maternal Aunt   . Mental illness Other     Social History   Tobacco Use  . Smoking status: Former Smoker    Packs/day: 1.50    Years: 18.00    Pack years: 27.00    Types: Cigarettes    Quit date: 03/31/1968    Years since quitting: 51.0  . Smokeless tobacco: Never Used  Substance Use Topics  . Alcohol use: No    Alcohol/week: 0.0 standard drinks  . Drug use: No    Home Medications Prior to Admission medications   Medication Sig  Start Date End Date Taking? Authorizing Provider  acetaminophen (TYLENOL) 325 MG tablet Take 650 mg by mouth every 6 (six) hours as needed.    [provider]  allopurinol (ZYLOPRIM) 100 MG tablet Take 200 mg by mouth daily.     [provider]  ALPRAZolam Duanne Moron) 0.25 MG tablet Take 0.25 mg by mouth every 12 (twelve) hours as needed for anxiety or sleep.     [provider]  apixaban (ELIQUIS) 5 MG TABS tablet Take 1 tablet (5 mg total) by mouth 2 (two) times daily. 02/19/15   Isaiah Serge, NP  Ascorbic Acid (VITAMIN C) 1000 MG tablet Take 1,000 mg by mouth daily.    [provider]  atorvastatin (LIPITOR) 10 MG tablet Take 10 mg by mouth at bedtime.     [provider]  B-D UF III MINI PEN NEEDLES 31G X 5 MM MISC  U UTD TO INJECT INSULIN 02/25/15   [provider]  Cholecalciferol (HM VITAMIN D3) 4000 UNITS CAPS Take 1,000 Units by mouth daily.     [provider]  citalopram (CELEXA) 20 MG tablet Take 20 mg by mouth daily.    [provider]  clotrimazole (LOTRIMIN) 1 % cream Apply 1 application topically 2 (two) times daily. To toe    [provider]  diclofenac sodium (VOLTAREN) 1 % GEL Apply 4 g topically 4 (four) times daily as needed (For pain.).     [provider]  diltiazem (DILACOR XR) 180 MG 24 hr capsule Take 1 capsule (180 mg total) by mouth daily. 04/22/17   Isaiah Serge, NP  diltiazem [facility]) 180 MG 24 hr capsule Take 180 mg by mouth daily. 03/21/19   [provider]  docusate sodium (COLACE) 100 MG capsule Take 100 mg by mouth daily as needed for mild constipation.    [provider]  furosemide (LASIX) 20 MG tablet Take 20 mg by mouth daily.     [provider]  guaiFENesin (MUCINEX) 600 MG 12 hr tablet Take by mouth every 12 (twelve) hours as needed for cough or to loosen phlegm.    [provider]  hydrocortisone (ANUSOL-HC) 25 MG suppository Place 25 mg rectally 2 (two) times daily as needed for hemorrhoids.     [provider]  insulin lispro (HUMALOG) 100 UNIT/ML injection Inject 16 Units into the skin once.    [provider]  isosorbide mononitrate (IMDUR) 60 MG 24 hr tablet Take 1 tablet (60 mg total) by mouth daily. 04/27/17   Isaiah Serge, NP  ketoconazole (NIZORAL) 2 % cream Apply 1 application topically as needed. For rash 08/12/18   [provider]  Liraglutide (VICTOZA) 18 MG/3ML SOPN Inject 1.2 mg into the skin daily.     [provider]  loperamide (IMODIUM A-D) 2 MG tablet Take 2 mg by mouth 4 (four) times daily as needed for diarrhea or loose stools.    [provider]  Melatonin 5 MG TABS Take 10 mg by mouth at bedtime.    [provider]   methocarbamol (ROBAXIN) 500 MG tablet Take 500 mg by mouth every 6 (six) hours as needed (back pain).     [provider]  nitroGLYCERIN (NITROSTAT) 0.4 MG SL tablet Place 1 tablet (0.4 mg total) under the tongue every 5 (five) minutes as needed for chest pain. 05/19/14   Jerline Pain, MD  nystatin (MYCOSTATIN/NYSTOP) powder Apply 1 application topically 2 (two) times daily. Under  breast    [provider]  Polyethyl Glycol-Propyl Glycol (SYSTANE) 0.4-0.3 % SOLN Place 1 drop into both eyes 2 (two) times a day.    [provider]  pregabalin (LYRICA) 100 MG capsule Take 1 capsule (100 mg total) by mouth 2 (two) times daily. Patient taking differently: Take 100-200 mg by mouth 2 (two) times daily. Give 1 capsule (100mg ) in th am, then 2 capsules (200mg ) in the evening 07/25/16   Pollina, Gwenyth Allegra, MD  psyllium (METAMUCIL SMOOTH TEXTURE) 28 % packet Take 1 packet by mouth daily as needed (constipation).    [provider]  Rivastigmine 13.3 MG/24HR PT24 Place 1 patch (13.3 mg total) onto the skin daily. 04/27/15   Pieter Partridge, DO  spironolactone (ALDACTONE) 25 MG tablet Take 25 mg by mouth 2 (two) times daily.     [provider]  tiZANidine (ZANAFLEX) 2 MG tablet Take 2 mg by mouth every 6 (six) hours as needed for muscle spasms.    [provider]  topiramate (TOPAMAX) 25 MG tablet Take 1 tablet (25 mg total) by mouth at bedtime. 11/24/18   Jaffe, Adam R, DO  TOUJEO SOLOSTAR 300 UNIT/ML SOPN Inject 80 mg into the skin at bedtime.  05/22/14   [provider]  traMADol (ULTRAM) 50 MG tablet Take 25 mg by mouth every 8 (eight) hours as needed for moderate pain.     [provider]  vitamin B-12 (CYANOCOBALAMIN) 1000 MCG tablet Take 1,000 mcg by mouth daily.    [provider]    Allergies    Avandia [rosiglitazone], Benazepril, Dilaudid [hydromorphone hcl], Fosamax [alendronate sodium], Talwin [pentazocine], Tetanus  toxoids, and Zyrtec [cetirizine]  Review of Systems   Review of Systems All other systems negative except as documented in the HPI. All pertinent positives and negatives as reviewed in the HPI. Physical Exam Updated Vital Signs BP 125/63 (BP Location: Left Arm)   Pulse 73   Temp 98.9 F (37.2 C) (Oral)   Resp (!) 26   SpO2 (!) 89%   Physical Exam Vitals and nursing note reviewed.  Constitutional:      General: She is not in acute distress.    Appearance: She is well-developed.  HENT:     Head: Normocephalic and atraumatic.  Eyes:     Pupils: Pupils are equal, round, and reactive to light.  Cardiovascular:     Rate and Rhythm: Normal rate and regular rhythm.     Heart sounds: Normal heart sounds. No murmur. No friction rub. No gallop.   Pulmonary:     Effort: Pulmonary effort is normal. No respiratory distress.     Breath sounds: Normal breath sounds. No wheezing.  Abdominal:     General: Bowel sounds are normal. There is no distension.     Palpations: Abdomen is soft.     Tenderness: There is no abdominal tenderness.  Musculoskeletal:     Cervical back: Normal range of motion and neck supple.  Skin:    General: Skin is warm and dry.     Capillary Refill: Capillary refill takes less than 2 seconds.     Findings: No erythema or rash.  Neurological:     Mental Status: She is alert and oriented to person, place, and time.     Motor: No abnormal muscle tone.     Coordination: Coordination normal.  Psychiatric:        Behavior: Behavior normal.     ED Results / Procedures / Treatments  Labs (all labs ordered are listed, but only abnormal results are displayed) Labs Reviewed  BASIC METABOLIC PANEL  CBC WITH DIFFERENTIAL/PLATELET  D-DIMER, QUANTITATIVE (NOT AT [facility])  FIBRINOGEN  LACTATE DEHYDROGENASE  PROCALCITONIN  FERRITIN  C-REACTIVE PROTEIN  ABO/RH    EKG None  Radiology XR Chest Portable  Result Date: 04/11/2019 CLINICAL DATA:  86 year old female  with cough. EXAM: PORTABLE CHEST 1 VIEW COMPARISON:  Chest radiograph dated 03/23/2014. FINDINGS: Minimal left lung base linear and streaky densities, likely atelectasis/scarring. No focal consolidation, pleural effusion, or pneumothorax. An 8 mm faint hazy density in the left mid lung field may represent atelectasis. A pulmonary nodule or infiltrate is not excluded. Attention on follow-up imaging recommended. Stable cardiomediastinal silhouette. There is calcification of the mitral annulus as well as atherosclerotic calcification of the aorta. No acute osseous pathology. IMPRESSION: 1. Left lung base linear atelectasis/scarring. No focal consolidation. 2.  Aortic Atherosclerosis (ICD10-I70.0). Electronically Signed   By: Anner Crete M.D.   On: 04/11/2019 19:03    Procedures Procedures (including critical care time)  Medications Ordered in ED Medications - No data to display  ED Course  I have reviewed the triage vital signs and the nursing notes.  Pertinent labs & imaging results that were available during my care of the patient were reviewed by me and considered in my medical decision making (see chart for details).    MDM Rules/Calculators/A&P                      Patient came in on nonrebreather and on nasal cannula.  She will most likely need admission for hypoxia with Covid. Final Clinical Impression(s) / ED Diagnoses Final diagnoses:  None    Rx / DC Orders ED Discharge Orders    None       Dalia Heading, PA-C 04/13/19 1522    Maudie Flakes, MD 04/14/19 [phone]

## 2019-04-14 LAB — GLUCOSE, CAPILLARY: Glucose-Capillary: 256 mg/dL — ABNORMAL HIGH (ref 70–99)

## 2019-04-14 MED ORDER — HALOPERIDOL LACTATE 5 MG/ML IJ SOLN
2.0000 mg | Freq: Four times a day (QID) | INTRAMUSCULAR | Status: DC | PRN
  Administered 2019-04-14 – 2019-04-20 (×4): 2 mg via INTRAVENOUS
  Filled 2019-04-14 (×5): qty 1

## 2019-04-14 MED ORDER — DILTIAZEM HCL ER COATED BEADS 180 MG PO CP24
180.0000 mg | ORAL_CAPSULE | Freq: Every day | ORAL | Status: DC
  Administered 2019-04-14 – 2019-04-17 (×4): 180 mg via ORAL
  Filled 2019-04-14 (×4): qty 1

## 2019-04-14 MED ORDER — ENOXAPARIN SODIUM 40 MG/0.4ML ~~LOC~~ SOLN: 40.0000 mg | SUBCUTANEOUS | Status: DC

## 2019-04-14 NOTE — Progress Notes (Signed)
Arrived from ED, phone report received from [facility], RN. Pt initially calm, confused and cooperative is now very agitated with attempts on pulling on lines, PIV, grabbing nurse with great force and climbing out of bed. Posey bed alarm and mat to floor. Haldol 2 mg IV given with very little effectiveness. Will request for sitter.

## 2019-04-14 NOTE — Progress Notes (Signed)
Son, [name], called and notified of pt transfer from ED and periods of agitation. Patient cooperative with less stress and agitation. Offered fluids, declined dinner. Tele sitter ordered and placed for continuous monitoring. IV rocephin and azithromycin currently administering. Saturation 93% on room air.

## 2019-04-14 NOTE — ED Notes (Signed)
Patient SpO2 90% on 4 liters of oxygen. Increased oxygen to 6 liters per minute with an improvement to 93-95%. Patient in no apparent distress at this time.

## 2019-04-14 NOTE — Progress Notes (Signed)
PROGRESS NOTE    Kara Ortega  [id number] DOB: June 08, 1929 DOA: 04/13/2019 PCP: Carol Ada, MD     Brief Narrative: 86 year old female with past medical history of dementia, Alzheimer type, chronic kidney disease stage IV, chronic obstructive pulmonary disease, diabetes mellitus type 2, hypertension, previous stroke with left-sided hemiparesis, who presented with acute onset of shortness of breath from nursing facility. Patient was seen at the ED last 3 days and diagnosed with COVID-19 virus infection without any respiratory symptoms and discharged back to nursing facility. Today patient developed acute onset of shortness of breath. Patient is fully alert and only oriented to person.  Has significant cognitive impairment from Alzheimer dementia. History was obtained from medical records and ED provider. No reported fever, chills, nausea, vomiting or diarrhea and patient denies same.  Please see H&P for full details  Assessment & Plan: Clinical problems list 1.  Acute hypoxic respiratory failure secondary to pneumonia due to COVID-19 virus infection 2.  Dementia, Alzheimer type 3.  Diabetes mellitus type 2 4.  Essential hypertension  5.  Previous CVA with left hemiparesis   1.  Acute hypoxic respiratory failure secondary to pneumonia due to COVID-19 virus infection.  Acute shortness of breath with hypoxia and chest x-ray finding concerning for multifocal pneumonia Will initiate remdesivir and dexamethasone Start empiric antibiotics ceftriaxone and azithromycin for possible secondary bacterial superinfection Initiate bronchodilators Continue with oxygen supplementation and wean as tolerated May de-escalate antibiotics as necessary  2.  Dementia, Alzheimer type Continue with Exelon  3.  Diabetes mellitus type 2 Continue with sliding scale insulin Initiate basal insulin Fingersticks before meals and at bedtime Hypoglycemic protocol  4.  Essential hypertension.   Blood pressure is stable Continue with home blood pressure monitoring  5.  Previous CVA with left hemiparesis Continue with secondary stroke prevention with aspirin and statin    DVT prophylaxis: Apixaban  Code Status: Prior  Family Communication: I spoke with patient's daughter, from out of town and discussed in detail care plan with her. Disposition Plan: Transfer to [facility]  Consultants:   None  Procedures: None  ( Antimicrobials:  Azithromycin Ceftriaxone  Subjective: Patient was seen and examined at bedside in the ED.  She is currently off oxygen and saturating above 90%. Has bilateral tremulous upper extremities.  Not in acute distress, alert and oriented x3.  Patient got a call from out of town and I spoke with her daughter and discussed detailed care plan with her and the progress of her mom.  Objective: Vitals:   04/14/19 1100 04/14/19 1102 04/14/19 1148 04/14/19 1200  BP: 101/67   100/77  Pulse: (!) 58 (!) 58  (!) 55  Resp: (!) 22 (!) 22  (!) 21  Temp:      TempSrc:      SpO2: 91% 91% 92% 92%    Intake/Output Summary (Last 24 hours) at 04/14/2019 1217 Last data filed at 04/13/2019 2321 Gross per 24 hour  Intake 600 ml  Output --  Net 600 ml   There were no vitals filed for this visit.  Examination:  General exam: Appears calm and comfortable  Respiratory system: Clear to auscultation. Respiratory effort normal. Cardiovascular system: S1 & S2 heard, RRR. No JVD, murmurs, rubs, gallops or clicks. No pedal edema. Gastrointestinal system: Abdomen is nondistended, soft and nontender. No organomegaly or masses felt. Normal bowel sounds heard. Central nervous system: Alert and oriented. No focal neurological deficits. Extremities: Symmetric 5 x 5 power. Skin: No rashes, lesions or ulcers Psychiatry:  Judgement and insight appear normal. Mood & affect appropriate.     Data Reviewed: I have personally reviewed following labs and imaging studies  CBC: Recent  Labs  Lab 04/11/19 1817 04/13/19 1457  WBC 7.8 7.0  NEUTROABS  --  5.2  HGB 17.1* 16.0*  HCT 52.6* 49.7*  MCV 89.2 88.3  PLT 157 [id number]*   Basic Metabolic Panel: Recent Labs  Lab 04/11/19 1817 04/13/19 1457  NA 133* 136  K 4.5 3.6  CL 93* 96*  CO2 26 28  GLUCOSE 125* 83  BUN 46* 40*  CREATININE 1.41* 1.35*  CALCIUM 9.5 9.1   GFR: CrCl cannot be calculated (Unknown ideal weight.). Liver Function Tests: No results for input(s): AST, ALT, ALKPHOS, BILITOT, PROT, ALBUMIN in the last 168 hours. No results for input(s): LIPASE, AMYLASE in the last 168 hours. No results for input(s): AMMONIA in the last 168 hours. Coagulation Profile: No results for input(s): INR, PROTIME in the last 168 hours. Cardiac Enzymes: No results for input(s): CKTOTAL, CKMB, CKMBINDEX, TROPONINI in the last 168 hours. BNP (last 3 results) No results for input(s): PROBNP in the last 8760 hours. HbA1C: No results for input(s): HGBA1C in the last 72 hours. CBG: No results for input(s): GLUCAP in the last 168 hours. Lipid Profile: No results for input(s): CHOL, HDL, LDLCALC, TRIG, CHOLHDL, LDLDIRECT in the last 72 hours. Thyroid Function Tests: No results for input(s): TSH, T4TOTAL, FREET4, T3FREE, THYROIDAB in the last 72 hours. Anemia Panel: Recent Labs    04/13/19 1457  FERRITIN 226   Sepsis Labs: Recent Labs  Lab 04/13/19 1457  PROCALCITON 0.12    Recent Results (from the past 240 hour(s))  Respiratory Panel by RT PCR (Flu A&B, Covid) - Nasopharyngeal Swab     Status: Abnormal   Collection Time: 04/11/19  6:17 PM   Specimen: Nasopharyngeal Swab  Result Value Ref Range Status   SARS Coronavirus 2 by RT PCR POSITIVE (A) NEGATIVE Final    Comment: RESULT CALLED TO, READ BACK BY AND VERIFIED WITH: HAMLETT,T @ 2126 ON [id number] BY POTEAT,S (NOTE) SARS-CoV-2 target nucleic acids are DETECTED. SARS-CoV-2 RNA is generally detectable in upper respiratory specimens  during the acute phase of  infection. Positive results are indicative of the presence of the identified virus, but do not rule out bacterial infection or co-infection with other pathogens not detected by the test. Clinical correlation with patient history and other diagnostic information is necessary to determine patient infection status. The expected result is Negative. Fact Sheet for Patients:  PinkCheek.be Fact Sheet for Healthcare Providers: GravelBags.it This test is not yet approved or cleared by the [place] FDA and  has been authorized for detection and/or diagnosis of SARS-CoV-2 by FDA under an Emergency Use Authorization (EUA).  This EUA will remain in effect (meaning this test can be used ) for the duration of  the COVID-19 declaration under Section 564(b)(1) of the Act, 21 U.S.C. section 360bbb-3(b)(1), unless the authorization is terminated or revoked sooner.    Influenza A by PCR NEGATIVE NEGATIVE Final   Influenza B by PCR NEGATIVE NEGATIVE Final    Comment: (NOTE) The Xpert Xpress SARS-CoV-2/FLU/RSV assay is intended as an aid in  the diagnosis of influenza from Nasopharyngeal swab specimens and  should not be used as a sole basis for treatment. Nasal washings and  aspirates are unacceptable for Xpert Xpress SARS-CoV-2/FLU/RSV  testing. Fact Sheet for Patients: PinkCheek.be Fact Sheet for Healthcare Providers: GravelBags.it This test is not yet approved  or cleared by the [place] and  has been authorized for detection and/or diagnosis of SARS-CoV-2 by  FDA under an Emergency Use Authorization (EUA). This EUA will remain  in effect (meaning this test can be used) for the duration of the  Covid-19 declaration under Section 564(b)(1) of the Act, 21  U.S.C. section 360bbb-3(b)(1), unless the authorization is  terminated or revoked. Performed at [facility], [place] [address], [place], [place] [address]          Radiology Studies: DG Chest Port 1 View  Result Date: 04/13/2019 CLINICAL DATA:  Sob, covid +, hypoxic EXAM: PORTABLE CHEST 1 VIEW COMPARISON:  04/11/2019 FINDINGS: Since the prior study, subtle hazy airspace opacity become apparent left mid and both lower lungs. Cardiac silhouette is normal in size. No convincing mediastinal hilar masses. No pleural effusion or pneumothorax. Skeletal structures are grossly intact. IMPRESSION: 1. Subtle hazy airspace opacities in the mid to lower lungs consistent with multifocal COVID-19 pneumonia. Electronically Signed   By: Lajean Manes M.D.   On: 04/13/2019 16:15        Scheduled Meds: . allopurinol  200 mg Oral Daily  . apixaban  5 mg Oral BID  . vitamin C  1,000 mg Oral Daily  . atorvastatin  10 mg Oral QHS  . budesonide  2 puff Inhalation BID  . cholecalciferol  4,000 Units Oral Daily  . citalopram  20 mg Oral Daily  . dexamethasone  6 mg Oral Daily  . diltiazem  180 mg Oral Daily  . isosorbide mononitrate  60 mg Oral Daily  . Melatonin  10 mg Oral QHS  . rivastigmine  13.3 mg Transdermal Q24H  . vitamin B-12  1,000 mcg Oral Daily  . zinc sulfate  220 mg Oral Daily   Continuous Infusions: . azithromycin Stopped (04/13/19 2321)  . cefTRIAXone (ROCEPHIN)  IV Stopped (04/13/19 1958)  . remdesivir 100 mg in NS 100 mL 100 mg (04/14/19 0924)     LOS: 1 day    Time spent: [place], MD [facility] Pager [phone]   If 7PM-7AM, please contact night-coverage [email or web address] Password [facility] 04/14/2019, 12:17 PM

## 2019-04-14 NOTE — ED Notes (Signed)
Pt. Documented in error see above note in chart. 

## 2019-04-14 NOTE — ED Notes (Signed)
Pt provided meal tray

## 2019-04-15 DIAGNOSIS — I482 Chronic atrial fibrillation, unspecified: Secondary | ICD-10-CM

## 2019-04-15 DIAGNOSIS — R410 Disorientation, unspecified: Secondary | ICD-10-CM

## 2019-04-15 LAB — COMPREHENSIVE METABOLIC PANEL
ALT: <5 U/L (ref 0–44)
AST: 60 U/L — ABNORMAL HIGH (ref 15–41)
Albumin: 3.5 g/dL (ref 3.5–5.0)
Alkaline Phosphatase: 57 U/L (ref 38–126)
Anion gap: 19 — ABNORMAL HIGH (ref 5–15)
BUN: 37 mg/dL — ABNORMAL HIGH (ref 8–23)
CO2: 20 mmol/L — ABNORMAL LOW (ref 22–32)
Calcium: 8.8 mg/dL — ABNORMAL LOW (ref 8.9–10.3)
Chloride: 97 mmol/L — ABNORMAL LOW (ref 98–111)
Creatinine, Ser: 1.14 mg/dL — ABNORMAL HIGH (ref 0.44–1.00)
GFR calc Af Amer: 50 mL/min — ABNORMAL LOW (ref >60)
GFR calc non Af Amer: 43 mL/min — ABNORMAL LOW (ref >60)
Glucose, Bld: 215 mg/dL — ABNORMAL HIGH (ref 70–99)
Potassium: 3.8 mmol/L (ref 3.5–5.1)
Sodium: 136 mmol/L (ref 135–145)
Total Bilirubin: 0.6 mg/dL (ref 0.3–1.2)
Total Protein: 6.7 g/dL (ref 6.5–8.1)

## 2019-04-15 LAB — D-DIMER, QUANTITATIVE: D-Dimer, Quant: 1.06 ug/mL-FEU — ABNORMAL HIGH (ref 0.00–0.50)

## 2019-04-15 LAB — CBC
HCT: 47.8 % — ABNORMAL HIGH (ref 36.0–46.0)
Hemoglobin: 15.6 g/dL — ABNORMAL HIGH (ref 12.0–15.0)
MCH: 28.9 pg (ref 26.0–34.0)
MCHC: 32.6 g/dL (ref 30.0–36.0)
MCV: 88.5 fL (ref 80.0–100.0)
Platelets: 136 10*3/uL — ABNORMAL LOW (ref 150–400)
RBC: 5.4 MIL/uL — ABNORMAL HIGH (ref 3.87–5.11)
RDW: 16.3 % — ABNORMAL HIGH (ref 11.5–15.5)
WBC: 7.8 10*3/uL (ref 4.0–10.5)
nRBC: 0 % (ref 0.0–0.2)

## 2019-04-15 LAB — PHOSPHORUS: Phosphorus: 3.4 mg/dL (ref 2.5–4.6)

## 2019-04-15 LAB — GLUCOSE, CAPILLARY
Glucose-Capillary: 248 mg/dL — ABNORMAL HIGH (ref 70–99)
Glucose-Capillary: 297 mg/dL — ABNORMAL HIGH (ref 70–99)
Glucose-Capillary: 370 mg/dL — ABNORMAL HIGH (ref 70–99)
Glucose-Capillary: 409 mg/dL — ABNORMAL HIGH (ref 70–99)

## 2019-04-15 LAB — C-REACTIVE PROTEIN: CRP: 0.9 mg/dL (ref <1.0)

## 2019-04-15 LAB — MAGNESIUM: Magnesium: 1.7 mg/dL (ref 1.7–2.4)

## 2019-04-15 MED ORDER — QUETIAPINE FUMARATE 25 MG PO TABS
25.0000 mg | ORAL_TABLET | Freq: Two times a day (BID) | ORAL | Status: DC
  Administered 2019-04-15 – 2019-04-16 (×3): 25 mg via ORAL
  Filled 2019-04-15 (×3): qty 1

## 2019-04-15 MED ORDER — INSULIN ASPART 100 UNIT/ML ~~LOC~~ SOLN
8.0000 [IU] | Freq: Once | SUBCUTANEOUS | Status: AC
  Administered 2019-04-15: 8 [IU] via SUBCUTANEOUS

## 2019-04-15 MED ORDER — ALPRAZOLAM 0.5 MG PO TABS
0.2500 mg | ORAL_TABLET | Freq: Two times a day (BID) | ORAL | Status: DC | PRN
  Administered 2019-04-15: 0.25 mg via ORAL
  Filled 2019-04-15: qty 1

## 2019-04-15 MED ORDER — PREGABALIN 100 MG PO CAPS
200.0000 mg | ORAL_CAPSULE | Freq: Every day | ORAL | Status: DC
  Administered 2019-04-15 – 2019-04-20 (×5): 200 mg via ORAL
  Filled 2019-04-15 (×6): qty 2

## 2019-04-15 MED ORDER — PREGABALIN 100 MG PO CAPS
100.0000 mg | ORAL_CAPSULE | Freq: Every day | ORAL | Status: DC
  Administered 2019-04-16 – 2019-04-21 (×7): 100 mg via ORAL
  Filled 2019-04-15 (×6): qty 1

## 2019-04-15 MED ORDER — PREGABALIN 100 MG PO CAPS: 100.0000 mg | ORAL_CAPSULE | Freq: Two times a day (BID) | ORAL | Status: DC

## 2019-04-15 MED ORDER — TOPIRAMATE 25 MG PO TABS
25.0000 mg | ORAL_TABLET | Freq: Every day | ORAL | Status: DC
  Administered 2019-04-15 – 2019-04-20 (×6): 25 mg via ORAL
  Filled 2019-04-15 (×7): qty 1

## 2019-04-15 MED ORDER — INSULIN ASPART 100 UNIT/ML ~~LOC~~ SOLN
0.0000 [IU] | Freq: Three times a day (TID) | SUBCUTANEOUS | Status: DC
  Administered 2019-04-16: 15 [IU] via SUBCUTANEOUS
  Administered 2019-04-16 (×2): 11 [IU] via SUBCUTANEOUS
  Administered 2019-04-17: 15 [IU] via SUBCUTANEOUS
  Administered 2019-04-17: 5 [IU] via SUBCUTANEOUS
  Administered 2019-04-17: 8 [IU] via SUBCUTANEOUS

## 2019-04-15 MED ORDER — OLANZAPINE 10 MG IM SOLR
5.0000 mg | Freq: Once | INTRAMUSCULAR | Status: AC
  Administered 2019-04-15: 5 mg via INTRAMUSCULAR
  Filled 2019-04-15: qty 10

## 2019-04-15 MED ORDER — INSULIN ASPART 100 UNIT/ML ~~LOC~~ SOLN
0.0000 [IU] | Freq: Every day | SUBCUTANEOUS | Status: DC
  Administered 2019-04-15: 5 [IU] via SUBCUTANEOUS
  Administered 2019-04-16: 2 [IU] via SUBCUTANEOUS

## 2019-04-15 MED ORDER — TIZANIDINE HCL 2 MG PO TABS
2.0000 mg | ORAL_TABLET | Freq: Four times a day (QID) | ORAL | Status: DC | PRN
  Administered 2019-04-17 – 2019-04-20 (×4): 2 mg via ORAL
  Filled 2019-04-15 (×5): qty 1

## 2019-04-15 MED ORDER — INSULIN GLARGINE 100 UNIT/ML ~~LOC~~ SOLN
40.0000 [IU] | Freq: Every day | SUBCUTANEOUS | Status: DC
  Administered 2019-04-15: 40 [IU] via SUBCUTANEOUS
  Filled 2019-04-15 (×2): qty 0.4

## 2019-04-15 NOTE — Progress Notes (Signed)
Patient continuously tries to get out of bed, two bed alarms placed, as well as fall mats. Charge nurse providing 1 to 1 observation while I get my other patients situated.

## 2019-04-15 NOTE — Progress Notes (Signed)
04/14/19 22:30 Providing 1 to 1 observation to patient related to safety concerns with high fall risk.. In addition to telecameras observation. Telecameras intervention was unsuccessful due to patient not being verbally reoriented.  04/15/19 00:00 Patients agitation increasing, patient pulling on tele leads, removing nasal canula, and climbing over side rails to exit bed. PRN chemical restraint unsuccessful as patients unsafe behavior continues after a 30 minutes nap.  Mittens placed on patient as patient continues to pull on tele leads and removing nasal canula causing saturation to decrease. Patient's agitation increase with mittens placed. Patient screaming, "help me" and removing mittens with teeth, once mittens removed with her teeth, patient grips my arm and attempts to bite me. Order for soft wrist restraints obtained and placed. Patient is calm and resting with restraints in place.

## 2019-04-15 NOTE — Progress Notes (Signed)
Inpatient Diabetes Program Recommendations  AACE/ADA: New Consensus Statement on Inpatient Glycemic Control (2015)  Target Ranges:  Prepandial:   less than 140 mg/dL      Peak postprandial:   less than 180 mg/dL (1-2 hours)      Critically ill patients:  140 - 180 mg/dL   Lab Results  Component Value Date   GLUCAP 370 (H) 04/15/2019   HGBA1C 8.9 (H) 03/23/2014    Review of Glycemic Control Results for Kara Ortega, Kara Ortega (MRN [id number]) as of 04/15/2019 16:01  Ref. Range 04/14/2019 16:53 04/15/2019 07:41 04/15/2019 12:22 04/15/2019 15:43  Glucose-Capillary Latest Ref Range: 70 - 99 mg/dL 256 (H) 248 (H) 297 (H) 370 (H)   Diabetes history: DM2 Outpatient Diabetes medications: Toujeo 80 units qd + Victoza 1.2 + Humalog ? doses Current orders for Inpatient glycemic control: Lantus 40 units qd  Inpatient Diabetes Program Recommendations:   Will probably need increase in Lantus while on steroids. -Glycemic control order set sensitive correction tid  -May also need meal coverage tid if eats well Will follow.  Thank you, Nani Gasser. Corlene Sabia, RN, MSN, CDE  Diabetes Coordinator Inpatient Glycemic Control Team Team Pager [phone] (8am-5pm) 04/15/2019 4:08 PM

## 2019-04-15 NOTE — Plan of Care (Signed)
  Problem: Education: Goal: Knowledge of risk factors and measures for prevention of condition will improve Outcome: Progressing   Problem: Coping: Goal: Psychosocial and spiritual needs will be supported Outcome: Progressing   Problem: Respiratory: Goal: Will maintain a patent airway Description: Currently receiving 4 liters [place] with sats in the upper 90's, pt will pull off nasal cannula, sat 93%. Respirations are non labored, diminished and clear breath sounds.  Outcome: Progressing Goal: Complications related to the disease process, condition or treatment will be avoided or minimized Outcome: Progressing

## 2019-04-15 NOTE — Progress Notes (Addendum)
PROGRESS NOTE    Kara Ortega  [id number] DOB: [birth year] DOA: 04/13/2019 PCP: Carol Ada, MD     Brief Narrative:  86 year old female with past medical history of dementia, Alzheimer type, chronic kidney disease stage IV, chronic obstructive pulmonary disease, diabetes mellitus type 2, hypertension, previous stroke with left-sided hemiparesis, who presented with acute onset of shortness of breath from nursing facility.  Was in ED 3 days prior to admission diagnosed with COVID-19, he presents with shortness of breath, was hypoxic on presentation, imaging with COVID-19 for pneumonia, she was transferred to [place] for further management.   Subjective: Per my discussion with staff, patient with some episodes of confusion, agitation overnight, to get out of bed, which she required telemetry sitter, restraints and as needed Haldol/Zyprexa .   Assessment & Plan:  Acute hypoxic respiratory failure secondary to pneumonia due to COVID-19 virus infection.   - Acute shortness of breath with hypoxia and chest x-ray finding concerning for multifocal opacity. -Continue with IV steroids. -Continue with IV remdesivir. -Procalcitonin within normal limit,no indications for  Antibiotics, will go ahead and discontinue -Continue with bronchodilators. -Patient significantly demented, doubt she will be to tolerate proning, but discussed with staff, they will try to encourage incentive spirometry and flutter valve. -Continue to monitor inflammatory markers closely.  COVID-19 Labs  Recent Labs    04/13/19 1457 04/15/19 0718 04/15/19 0719  DDIMER 0.95* 1.06*  --   FERRITIN 226  --   --   LDH 205*  --   --   CRP 0.8  --  0.9    Lab Results  Component Value Date   SARSCOV2NAA POSITIVE (A) 04/11/2019   Alzheimer's dementia with behavioral disturbances/hospital delirium - Continue with Exelon -As well patient with significant confusion, impulsiveness, trying to get out of bed to chair, she  is requiring wrist restraints, telemetry sitter, will start on low-dose Seroquel, she did require Zyprexa overnight, she is on as needed Haldol, will start on low-dose Seroquel, will try to arrange for bedside sitter.  Diabetes mellitus type 2 -Tarted on insulin sliding scale, she is on Toujeo 80 units subcu nightly, I will substitute with Lantus 40 units daily and adjust as needed.  Continue with sliding scale insulin Initiate basal insulin Fingersticks before meals and at bedtime Hypoglycemic protocol  Essential hypertension. -Continue with home medication  Chronic atrial fibrillation -Continue with Cardizem for heart rate control, continue with apixaban for anticoagulation  Previous CVA with left hemiparesis -Continue with Eliquis and statin     DVT prophylaxis: Apixaban  Code Status: Full Family Communication: daughter via phone Disposition Plan: Transfer to [place]  Consultants:   None  Procedures: None  ( Antimicrobials:  Azithromycin Ceftriaxone  Objective: Vitals:   04/15/19 0000 04/15/19 0430 04/15/19 0737 04/15/19 1137  BP: (!) 114/52 92/77 108/71 (!) 115/53  Pulse: 66 66 (!) 108 61  Resp:  18  (!) 23  Temp: 98.2 F (36.8 C) 98.1 F (36.7 C) (!) 97.5 F (36.4 C) 97.7 F (36.5 C)  TempSrc: Axillary Axillary Axillary Axillary  SpO2: 91% 92% 93% 92%  Weight:      Height:        Intake/Output Summary (Last 24 hours) at 04/15/2019 1317 Last data filed at 04/14/2019 1813 Gross per 24 hour  Intake 540 ml  Output --  Net 540 ml   Filed Weights   04/14/19 2036  Weight: 96.2 kg    Examination:  Awake Alert, Oriented X 1, demented, confused, no apparent distress  Symmetrical Chest wall movement, Good air movement bilaterally, CTAB RRR,No Gallops,Rubs or new Murmurs, No Parasternal Heave +ve B.Sounds, Abd Soft, No tenderness, No rebound - guarding or rigidity. No Cyanosis, Clubbing or edema, No new Rash or bruise       Data Reviewed: I have  personally reviewed following labs and imaging studies  CBC: Recent Labs  Lab 04/11/19 1817 04/13/19 1457 04/15/19 0039  WBC 7.8 7.0 7.8  NEUTROABS  --  5.2  --   HGB 17.1* 16.0* 15.6*  HCT 52.6* 49.7* 47.8*  MCV 89.2 88.3 88.5  PLT 157 134* [id number]*   Basic Metabolic Panel: Recent Labs  Lab 04/11/19 1817 04/13/19 1457 04/15/19 0039  NA 133* 136 136  K 4.5 3.6 3.8  CL 93* 96* 97*  CO2 26 28 20*  GLUCOSE 125* 83 215*  BUN 46* 40* 37*  CREATININE 1.41* 1.35* 1.14*  CALCIUM 9.5 9.1 8.8*  MG  --   --  1.7  PHOS  --   --  3.4   GFR: Estimated Creatinine Clearance: 36.1 mL/min (A) (by C-G formula based on SCr of 1.14 mg/dL (H)). Liver Function Tests: Recent Labs  Lab 04/15/19 0039  AST 60*  ALT <5  ALKPHOS 57  BILITOT 0.6  PROT 6.7  ALBUMIN 3.5   No results for input(s): LIPASE, AMYLASE in the last 168 hours. No results for input(s): AMMONIA in the last 168 hours. Coagulation Profile: No results for input(s): INR, PROTIME in the last 168 hours. Cardiac Enzymes: No results for input(s): CKTOTAL, CKMB, CKMBINDEX, TROPONINI in the last 168 hours. BNP (last 3 results) No results for input(s): PROBNP in the last 8760 hours. HbA1C: No results for input(s): HGBA1C in the last 72 hours. CBG: Recent Labs  Lab 04/14/19 1653 04/15/19 0741 04/15/19 1222  GLUCAP 256* 248* 297*   Lipid Profile: No results for input(s): CHOL, HDL, LDLCALC, TRIG, CHOLHDL, LDLDIRECT in the last 72 hours. Thyroid Function Tests: No results for input(s): TSH, T4TOTAL, FREET4, T3FREE, THYROIDAB in the last 72 hours. Anemia Panel: Recent Labs    04/13/19 1457  FERRITIN 226   Sepsis Labs: Recent Labs  Lab 04/13/19 1457  PROCALCITON 0.12    Recent Results (from the past 240 hour(s))  Respiratory Panel by RT PCR (Flu A&B, Covid) - Nasopharyngeal Swab     Status: Abnormal   Collection Time: 04/11/19  6:17 PM   Specimen: Nasopharyngeal Swab  Result Value Ref Range Status   SARS  Coronavirus 2 by RT PCR POSITIVE (A) NEGATIVE Final    Comment: RESULT CALLED TO, READ BACK BY AND VERIFIED WITH: HAMLETT,T @ 2126 ON [id number] BY POTEAT,S (NOTE) SARS-CoV-2 target nucleic acids are DETECTED. SARS-CoV-2 RNA is generally detectable in upper respiratory specimens  during the acute phase of infection. Positive results are indicative of the presence of the identified virus, but do not rule out bacterial infection or co-infection with other pathogens not detected by the test. Clinical correlation with patient history and other diagnostic information is necessary to determine patient infection status. The expected result is Negative. Fact Sheet for Patients:  PinkCheek.be Fact Sheet for Healthcare Providers: GravelBags.it This test is not yet approved or cleared by the [place] FDA and  has been authorized for detection and/or diagnosis of SARS-CoV-2 by FDA under an Emergency Use Authorization (EUA).  This EUA will remain in effect (meaning this test can be used ) for the duration of  the COVID-19 declaration under Section 564(b)(1) of the Act, 21  U.S.C. section 360bbb-3(b)(1), unless the authorization is terminated or revoked sooner.    Influenza A by PCR NEGATIVE NEGATIVE Final   Influenza B by PCR NEGATIVE NEGATIVE Final    Comment: (NOTE) The Xpert Xpress SARS-CoV-2/FLU/RSV assay is intended as an aid in  the diagnosis of influenza from Nasopharyngeal swab specimens and  should not be used as a sole basis for treatment. Nasal washings and  aspirates are unacceptable for Xpert Xpress SARS-CoV-2/FLU/RSV  testing. Fact Sheet for Patients: PinkCheek.be Fact Sheet for Healthcare Providers: GravelBags.it This test is not yet approved or cleared by the [place] FDA and  has been authorized for detection and/or diagnosis of SARS-CoV-2 by  FDA under an  Emergency Use Authorization (EUA). This EUA will remain  in effect (meaning this test can be used) for the duration of the  Covid-19 declaration under Section 564(b)(1) of the Act, 21  U.S.C. section 360bbb-3(b)(1), unless the authorization is  terminated or revoked. Performed at [facility], [place] [address], [place], [place] [address]          Radiology Studies: DG Chest Port 1 View  Result Date: 04/13/2019 CLINICAL DATA:  Sob, covid +, hypoxic EXAM: PORTABLE CHEST 1 VIEW COMPARISON:  04/11/2019 FINDINGS: Since the prior study, subtle hazy airspace opacity become apparent left mid and both lower lungs. Cardiac silhouette is normal in size. No convincing mediastinal hilar masses. No pleural effusion or pneumothorax. Skeletal structures are grossly intact. IMPRESSION: 1. Subtle hazy airspace opacities in the mid to lower lungs consistent with multifocal COVID-19 pneumonia. Electronically Signed   By: Lajean Manes M.D.   On: 04/13/2019 16:15        Scheduled Meds: . allopurinol  200 mg Oral Daily  . apixaban  5 mg Oral BID  . vitamin C  1,000 mg Oral Daily  . atorvastatin  10 mg Oral QHS  . budesonide  2 puff Inhalation BID  . cholecalciferol  4,000 Units Oral Daily  . citalopram  20 mg Oral Daily  . dexamethasone  6 mg Oral Daily  . diltiazem  180 mg Oral Daily  . isosorbide mononitrate  60 mg Oral Daily  . Melatonin  10 mg Oral QHS  . rivastigmine  13.3 mg Transdermal Q24H  . vitamin B-12  1,000 mcg Oral Daily  . zinc sulfate  220 mg Oral Daily   Continuous Infusions: . remdesivir 100 mg in NS 100 mL 100 mg (04/15/19 0845)     LOS: 2 days    Phillips Climes, MD [facility]  If 7PM-7AM, please contact night-coverage [email or web address] Password [email or web address] 04/15/2019, 1:17 PM

## 2019-04-16 ENCOUNTER — Inpatient Hospital Stay (HOSPITAL_COMMUNITY): Payer: Medicare Other

## 2019-04-16 DIAGNOSIS — E119 Type 2 diabetes mellitus without complications: Secondary | ICD-10-CM

## 2019-04-16 DIAGNOSIS — Z794 Long term (current) use of insulin: Secondary | ICD-10-CM

## 2019-04-16 LAB — COMPREHENSIVE METABOLIC PANEL
ALT: 30 U/L (ref 0–44)
AST: 54 U/L — ABNORMAL HIGH (ref 15–41)
Albumin: 3.5 g/dL (ref 3.5–5.0)
Alkaline Phosphatase: 58 U/L (ref 38–126)
Anion gap: 13 (ref 5–15)
BUN: 41 mg/dL — ABNORMAL HIGH (ref 8–23)
CO2: 23 mmol/L (ref 22–32)
Calcium: 8.9 mg/dL (ref 8.9–10.3)
Chloride: 98 mmol/L (ref 98–111)
Creatinine, Ser: 1.05 mg/dL — ABNORMAL HIGH (ref 0.44–1.00)
GFR calc Af Amer: 55 mL/min — ABNORMAL LOW (ref >60)
GFR calc non Af Amer: 48 mL/min — ABNORMAL LOW (ref >60)
Glucose, Bld: 379 mg/dL — ABNORMAL HIGH (ref 70–99)
Potassium: 3.9 mmol/L (ref 3.5–5.1)
Sodium: 134 mmol/L — ABNORMAL LOW (ref 135–145)
Total Bilirubin: 0.5 mg/dL (ref 0.3–1.2)
Total Protein: 6.4 g/dL — ABNORMAL LOW (ref 6.5–8.1)

## 2019-04-16 LAB — GLUCOSE, CAPILLARY
Glucose-Capillary: 302 mg/dL — ABNORMAL HIGH (ref 70–99)
Glucose-Capillary: 354 mg/dL — ABNORMAL HIGH (ref 70–99)
Glucose-Capillary: 350 mg/dL — ABNORMAL HIGH (ref 70–99)

## 2019-04-16 LAB — C-REACTIVE PROTEIN: CRP: 0.7 mg/dL (ref <1.0)

## 2019-04-16 LAB — CBC
HCT: 47.3 % — ABNORMAL HIGH (ref 36.0–46.0)
Hemoglobin: 15.7 g/dL — ABNORMAL HIGH (ref 12.0–15.0)
MCH: 28.9 pg (ref 26.0–34.0)
MCHC: 33.2 g/dL (ref 30.0–36.0)
MCV: 87.1 fL (ref 80.0–100.0)
Platelets: 138 10*3/uL — ABNORMAL LOW (ref 150–400)
RBC: 5.43 MIL/uL — ABNORMAL HIGH (ref 3.87–5.11)
RDW: 16.4 % — ABNORMAL HIGH (ref 11.5–15.5)
WBC: 7.9 10*3/uL (ref 4.0–10.5)
nRBC: 0 % (ref 0.0–0.2)

## 2019-04-16 LAB — ABO/RH: ABO/RH(D): O POS

## 2019-04-16 LAB — D-DIMER, QUANTITATIVE: D-Dimer, Quant: 0.82 ug/mL-FEU — ABNORMAL HIGH (ref 0.00–0.50)

## 2019-04-16 LAB — HEMOGLOBIN A1C
Hgb A1c MFr Bld: 8.1 % — ABNORMAL HIGH (ref 4.8–5.6)
Mean Plasma Glucose: 185.77 mg/dL

## 2019-04-16 MED ORDER — SODIUM CHLORIDE 0.9 % IV SOLN
3.0000 g | Freq: Four times a day (QID) | INTRAVENOUS | Status: DC
  Administered 2019-04-16 – 2019-04-21 (×21): 3 g via INTRAVENOUS
  Filled 2019-04-16 (×22): qty 8
  Filled 2019-04-16: qty 3
  Filled 2019-04-16 (×2): qty 8

## 2019-04-16 MED ORDER — INSULIN GLARGINE 100 UNIT/ML ~~LOC~~ SOLN
85.0000 [IU] | Freq: Every day | SUBCUTANEOUS | Status: DC
  Filled 2019-04-16: qty 0.85

## 2019-04-16 MED ORDER — INSULIN GLARGINE 100 UNIT/ML ~~LOC~~ SOLN
10.0000 [IU] | Freq: Once | SUBCUTANEOUS | Status: AC
  Administered 2019-04-16: 10 [IU] via SUBCUTANEOUS
  Filled 2019-04-16: qty 0.1

## 2019-04-16 MED ORDER — QUETIAPINE FUMARATE 25 MG PO TABS
25.0000 mg | ORAL_TABLET | Freq: Every day | ORAL | Status: DC
  Administered 2019-04-17 – 2019-04-19 (×3): 25 mg via ORAL
  Filled 2019-04-16 (×3): qty 1

## 2019-04-16 MED ORDER — SODIUM CHLORIDE 0.9% IV SOLUTION: Freq: Once | INTRAVENOUS | Status: AC

## 2019-04-16 MED ORDER — LINAGLIPTIN 5 MG PO TABS
5.0000 mg | ORAL_TABLET | Freq: Every day | ORAL | Status: DC
  Administered 2019-04-16 – 2019-04-21 (×6): 5 mg via ORAL
  Filled 2019-04-16 (×6): qty 1

## 2019-04-16 MED ORDER — INSULIN GLARGINE 100 UNIT/ML ~~LOC~~ SOLN
75.0000 [IU] | Freq: Every day | SUBCUTANEOUS | Status: DC
  Administered 2019-04-16: 75 [IU] via SUBCUTANEOUS
  Filled 2019-04-16: qty 0.75

## 2019-04-16 MED ORDER — DEXAMETHASONE SODIUM PHOSPHATE 10 MG/ML IJ SOLN
10.0000 mg | Freq: Every day | INTRAMUSCULAR | Status: DC
  Administered 2019-04-16 – 2019-04-21 (×6): 10 mg via INTRAVENOUS
  Filled 2019-04-16 (×6): qty 1

## 2019-04-16 NOTE — Progress Notes (Signed)
Spoke to patients son Learlean Naquin,  described in details previous shift 04/14/19, needing charge nurse to 1 to 1 observe patient, as I attended to other patients, due to shortage on additional staff to sit with Vinnie [place]. I described my interaction and patients escalated events of aggressions leading to need for soft wrist restraints.  [name] stated, "my mother was on some medication on the day shift, I can't recall the name. On that medication, she slept and was cooperative. I would prefer the chemical restraints vs the physical restraints." [name] made aware unfortunately not always having opportunity to have 1 to 1 observation for his mother Rodericka, [place] stated he is well aware and accepts that and would prefer chemical restraints still.

## 2019-04-16 NOTE — Progress Notes (Signed)
Currently at patients bedside for 1 to 1 observation. Patient  heart rate 49 - 51 bpm, O2 saturation 90% post increase on nasal canula from 4 liters to  8 liters and humidified.

## 2019-04-16 NOTE — Progress Notes (Signed)
Pharmacy Antibiotic Note  Kara Ortega is a 86 y.o. female admitted on 04/13/2019 with Covid pneumonia on D#4 remdesivir.  Pharmacy has been consulted for unasyn for aspiration pneumonia, crcl ~ 40 ml/min.  Plan: Unasyn 3g IV Q 6 hrs  Height: 5' (152.4 cm) Weight: 212 lb 1.3 oz (96.2 kg) IBW/kg (Calculated) : 45.5  Temp (24hrs), Avg:97.4 F (36.3 C), Min:96.6 F (35.9 C), Max:97.8 F (36.6 C)  Recent Labs  Lab 04/11/19 1817 04/13/19 1457 04/15/19 0039 04/16/19 0158  WBC 7.8 7.0 7.8 7.9  CREATININE 1.41* 1.35* 1.14* 1.05*    Estimated Creatinine Clearance: 39.2 mL/min (A) (by C-G formula based on SCr of 1.05 mg/dL (H)).    Allergies  Allergen Reactions  . Avandia [Rosiglitazone] Other (See Comments)    edema  . Benazepril Other (See Comments)    unknown  . Dilaudid [Hydromorphone Hcl] Other (See Comments)    "does not tolerate strong pain medications" per pt  . Fosamax [Alendronate Sodium] Nausea And Vomiting  . Talwin [Pentazocine] Nausea And Vomiting  . Tetanus Toxoids Other (See Comments)    "allergic to something in the tetanus injection" per patient  . Zyrtec [Cetirizine] Other (See Comments)    unknown    Antimicrobials this admission: Remdesivir 1/13 >> (1/17) Azith & CTX 1/13 >> 1/14 Unasyn 1/16 >>  Dose adjustments this admission:   Microbiology results:  Thank you for allowing pharmacy to be a part of this patient's care.  Maryanna Shape, PharmD, BCPS, BCPPS Clinical Pharmacist  Pager: [phone]   04/16/2019 11:55 AM

## 2019-04-16 NOTE — Progress Notes (Signed)
Patient's heart rate decreased to 42 steady 48. SPO2 90%. Physician paged to notify change ion condition

## 2019-04-16 NOTE — Plan of Care (Addendum)
Patient awaken this am for breakfast and medications, ate about 25% of meal, took medications with applesauce without difficulty. Patient remains calm and cooperative when awake . Patient was smiling and happy to see snow falling outside this am.   Problem: Education: Goal: Knowledge of risk factors and measures for prevention of condition will improve Outcome: Progressing   Problem: Coping: Goal: Psychosocial and spiritual needs will be supported Outcome: Progressing   Problem: Respiratory: Goal: Will maintain a patent airway Description: Currently receiving 4 liters [place] with sats in the upper 90's, pt will pull off nasal cannula, sat 93%. Respirations are non labored, diminished and clear breath sounds.  Outcome: Not Progressing Goal: Complications related to the disease process, condition or treatment will be avoided or minimized Outcome: Progressing

## 2019-04-16 NOTE — Progress Notes (Signed)
PROGRESS NOTE    Kara Ortega  [id number] DOB: 1930-02-28 DOA: 04/13/2019 PCP: Carol Ada, MD     Brief Narrative:  86 year old female with past medical history of dementia, Alzheimer type, chronic kidney disease stage IV, chronic obstructive pulmonary disease, diabetes mellitus type 2, hypertension, previous stroke with left-sided hemiparesis, who presented with acute onset of shortness of breath from nursing facility.  Was in ED 3 days prior to admission diagnosed with COVID-19, he presents with shortness of breath, was hypoxic on presentation, imaging with COVID-19 for pneumonia, she was transferred to [facility] for further management.   Subjective:  Patient is poor historian, she cannot provide any significant complaints overnight, she did not require any additional Haldol or Zyprexa overnight  Assessment & Plan:  Acute hypoxic respiratory failure secondary to pneumonia due to COVID-19 virus infection.   - Acute shortness of breath with hypoxia and chest x-ray finding concerning for multifocal opacity, patient oxygen requirement continued trending up, this morning she is on 8 L nasal cannula. -Discussed with staff, they will try to encourage her to use incentive spirometry, it would be difficult given her significant dementia. -Continue with IV steroids. -Continue with IV remdesivir. -Procalcitonin within normal limit. -Continue with bronchodilators. -Discussed with son convulsant plasma, he is agreeable, will proceed with convalescent plasma. -Patient with known history of diverticulitis, questionable hepatitis, as well her CRP within normal limit, so there is no role for Actemra here. -Continue to monitor inflammatory markers closely.  COVID-19 Labs  Recent Labs    04/13/19 1457 04/15/19 0718 04/15/19 0719 04/16/19 0158  DDIMER 0.95* 1.06*  --  0.82*  FERRITIN 226  --   --   --   LDH 205*  --   --   --   CRP 0.8  --  0.9 0.7    Lab Results  Component Value  Date   SARSCOV2NAA POSITIVE (A) 04/11/2019   Alzheimer's dementia with behavioral disturbances/hospital delirium - Continue with Exelon -Patient with significant sundowning/hospital delirium, requiring bedside sitter yesterday, this seems to be improved on Seroquel , she is currently less impulsive, she remains on as needed Haldol .   - Continue with home medication including Celexa, alprazolam, Lyrica and Topamax.  Diabetes mellitus type 2 -Continue with insulin sliding scale, she is on Toujeo 80 units subcu nightly, now her CBG started to increase, will up her Lantus to 75 units subcu daily . -We will start on Tradjenta  - Fingersticks before meals and at bedtime - Hypoglycemic protocol  Essential hypertension. -Continue with home medication  Chronic atrial fibrillation -Continue with Cardizem for heart rate control, continue with apixaban for anticoagulation  Previous CVA with left hemiparesis -Continue with Eliquis and statin     DVT prophylaxis: Apixaban  Code Status: Partial code as discussed with the son, no CPR/defibrillation, but okay with ventilation Family Communication: Son via phone Disposition Plan:  Consultants:   None  Procedures: None  Objective: Vitals:   04/16/19 0000 04/16/19 0519 04/16/19 0725 04/16/19 1330  BP: (!) 101/54 (!) 108/56 (!) 118/54   Pulse: (!) 58 (!) 42 (!) 46 (!) 54  Resp: 20  15 (!) 21  Temp: (!) 97.2 F (36.2 C) (!) 96.6 F (35.9 C) (!) 97.5 F (36.4 C)   TempSrc: Axillary Axillary Axillary   SpO2: 92% 90% 91% 94%  Weight:      Height:        Intake/Output Summary (Last 24 hours) at 04/16/2019 1354 Last data filed at 04/16/2019 1328  Gross per 24 hour  Intake 340 ml  Output 800 ml  Net -460 ml   Filed Weights   04/14/19 2036  Weight: 96.2 kg    Examination:  Awake Alert, Oriented X 1, No new F.N deficits, Normal affect Symmetrical Chest wall movement, her breathing appears to be more shallow today, with  bibasilar Rales. RRR,No Gallops,Rubs or new Murmurs, No Parasternal Heave +ve B.Sounds, Abd Soft, No tenderness, No rebound - guarding or rigidity. No Cyanosis, Clubbing or edema, No new Rash or bruise         Data Reviewed: I have personally reviewed following labs and imaging studies  CBC: Recent Labs  Lab 04/11/19 1817 04/13/19 1457 04/15/19 0039 04/16/19 0158  WBC 7.8 7.0 7.8 7.9  NEUTROABS  --  5.2  --   --   HGB 17.1* 16.0* 15.6* 15.7*  HCT 52.6* 49.7* 47.8* 47.3*  MCV 89.2 88.3 88.5 87.1  PLT 157 134* 136* 0000000*   Basic Metabolic Panel: Recent Labs  Lab 04/11/19 1817 04/13/19 1457 04/15/19 0039 04/16/19 0158  NA 133* 136 136 134*  K 4.5 3.6 3.8 3.9  CL 93* 96* 97* 98  CO2 26 28 20* 23  GLUCOSE 125* 83 215* 379*  BUN 46* 40* 37* 41*  CREATININE 1.41* 1.35* 1.14* 1.05*  CALCIUM 9.5 9.1 8.8* 8.9  MG  --   --  1.7  --   PHOS  --   --  3.4  --    GFR: Estimated Creatinine Clearance: 39.2 mL/min (A) (by C-G formula based on SCr of 1.05 mg/dL (H)). Liver Function Tests: Recent Labs  Lab 04/15/19 0039 04/16/19 0158  AST 60* 54*  ALT <5 30  ALKPHOS 57 58  BILITOT 0.6 0.5  PROT 6.7 6.4*  ALBUMIN 3.5 3.5   No results for input(s): LIPASE, AMYLASE in the last 168 hours. No results for input(s): AMMONIA in the last 168 hours. Coagulation Profile: No results for input(s): INR, PROTIME in the last 168 hours. Cardiac Enzymes: No results for input(s): CKTOTAL, CKMB, CKMBINDEX, TROPONINI in the last 168 hours. BNP (last 3 results) No results for input(s): PROBNP in the last 8760 hours. HbA1C: Recent Labs    04/16/19 0158  HGBA1C 8.1*   CBG: Recent Labs  Lab 04/15/19 1222 04/15/19 1543 04/15/19 2124 04/16/19 0739 04/16/19 1209  GLUCAP 297* 370* 409* 302* 354*   Lipid Profile: No results for input(s): CHOL, HDL, LDLCALC, TRIG, CHOLHDL, LDLDIRECT in the last 72 hours. Thyroid Function Tests: No results for input(s): TSH, T4TOTAL, FREET4, T3FREE,  THYROIDAB in the last 72 hours. Anemia Panel: Recent Labs    04/13/19 1457  FERRITIN 226   Sepsis Labs: Recent Labs  Lab 04/13/19 1457  PROCALCITON 0.12    Recent Results (from the past 240 hour(s))  Respiratory Panel by RT PCR (Flu A&B, Covid) - Nasopharyngeal Swab     Status: Abnormal   Collection Time: 04/11/19  6:17 PM   Specimen: Nasopharyngeal Swab  Result Value Ref Range Status   SARS Coronavirus 2 by RT PCR POSITIVE (A) NEGATIVE Final    Comment: RESULT CALLED TO, READ BACK BY AND VERIFIED WITH: HAMLETT,T @ 2126 ON [id number] BY POTEAT,S (NOTE) SARS-CoV-2 target nucleic acids are DETECTED. SARS-CoV-2 RNA is generally detectable in upper respiratory specimens  during the acute phase of infection. Positive results are indicative of the presence of the identified virus, but do not rule out bacterial infection or co-infection with other pathogens not detected by the test.  Clinical correlation with patient history and other diagnostic information is necessary to determine patient infection status. The expected result is Negative. Fact Sheet for Patients:  PinkCheek.be Fact Sheet for Healthcare Providers: GravelBags.it This test is not yet approved or cleared by the [place] FDA and  has been authorized for detection and/or diagnosis of SARS-CoV-2 by FDA under an Emergency Use Authorization (EUA).  This EUA will remain in effect (meaning this test can be used ) for the duration of  the COVID-19 declaration under Section 564(b)(1) of the Act, 21 U.S.C. section 360bbb-3(b)(1), unless the authorization is terminated or revoked sooner.    Influenza A by PCR NEGATIVE NEGATIVE Final   Influenza B by PCR NEGATIVE NEGATIVE Final    Comment: (NOTE) The Xpert Xpress SARS-CoV-2/FLU/RSV assay is intended as an aid in  the diagnosis of influenza from Nasopharyngeal swab specimens and  should not be used as a sole basis for  treatment. Nasal washings and  aspirates are unacceptable for Xpert Xpress SARS-CoV-2/FLU/RSV  testing. Fact Sheet for Patients: PinkCheek.be Fact Sheet for Healthcare Providers: GravelBags.it This test is not yet approved or cleared by the [place] FDA and  has been authorized for detection and/or diagnosis of SARS-CoV-2 by  FDA under an Emergency Use Authorization (EUA). This EUA will remain  in effect (meaning this test can be used) for the duration of the  Covid-19 declaration under Section 564(b)(1) of the Act, 21  U.S.C. section 360bbb-3(b)(1), unless the authorization is  terminated or revoked. Performed at [facility], [place] [address] [place], [place] [address]          Radiology Studies: [facility] Chest Port 1 View  Result Date: 04/16/2019 CLINICAL DATA:  COVID-19 virus infection. EXAM: PORTABLE CHEST 1 VIEW COMPARISON:  April 13, 2019. FINDINGS: Stable cardiomediastinal silhouette. No pneumothorax is noted. Stable bilateral lung opacities are noted, left greater than right, concerning for multifocal pneumonia. No significant pleural effusion is noted. Bony thorax is unremarkable. IMPRESSION: Stable bilateral lung opacities are noted concerning for multifocal pneumonia. Electronically Signed   By: Marijo Conception M.D.   On: 04/16/2019 08:46        Scheduled Meds: . allopurinol  200 mg Oral Daily  . apixaban  5 mg Oral BID  . vitamin C  1,000 mg Oral Daily  . atorvastatin  10 mg Oral QHS  . budesonide  2 puff Inhalation BID  . cholecalciferol  4,000 Units Oral Daily  . citalopram  20 mg Oral Daily  . dexamethasone (DECADRON) injection  10 mg Intravenous Daily  . diltiazem  180 mg Oral Daily  . insulin aspart  0-15 Units Subcutaneous TID WC  . insulin aspart  0-5 Units Subcutaneous QHS  . insulin glargine  75 Units Subcutaneous Daily  . isosorbide mononitrate  60 mg Oral Daily  .  Melatonin  10 mg Oral QHS  . pregabalin  100 mg Oral Daily   And  . pregabalin  200 mg Oral QHS  . QUEtiapine  25 mg Oral BID  . rivastigmine  13.3 mg Transdermal Q24H  . topiramate  25 mg Oral QHS  . vitamin B-12  1,000 mcg Oral Daily  . zinc sulfate  220 mg Oral Daily   Continuous Infusions: . ampicillin-sulbactam (UNASYN) IV 3 g (04/16/19 1252)  . remdesivir 100 mg in NS 100 mL 100 mg (04/16/19 0817)     LOS: 3 days    Phillips Climes, MD [facility]  If 7PM-7AM, please  contact night-coverage [email or web address] Password [email or web address] 04/16/2019, 1:54 PM

## 2019-04-16 NOTE — Progress Notes (Signed)
Patient asleep majority of shift, easily awaken by voice and touch. Patient pleasantly confused, calm and cooperative during shift, was awaken for meals and position change without incident. Heart rate remains in 40-50's without any signs of distress, MD is aware, instructed to continue to monitor. Plasma x 1 unit currently infusing, no distress noted. O2 decreased from 10 LPM HFNC to 7 LPM, throughout the shift, current O2 sat 92%.

## 2019-04-17 DIAGNOSIS — G309 Alzheimer's disease, unspecified: Secondary | ICD-10-CM

## 2019-04-17 DIAGNOSIS — F0281 Dementia in other diseases classified elsewhere with behavioral disturbance: Secondary | ICD-10-CM

## 2019-04-17 LAB — COMPREHENSIVE METABOLIC PANEL
ALT: 25 U/L (ref 0–44)
AST: 32 U/L (ref 15–41)
Albumin: 3.4 g/dL — ABNORMAL LOW (ref 3.5–5.0)
Alkaline Phosphatase: 56 U/L (ref 38–126)
Anion gap: 11 (ref 5–15)
BUN: 39 mg/dL — ABNORMAL HIGH (ref 8–23)
CO2: 25 mmol/L (ref 22–32)
Calcium: 8.9 mg/dL (ref 8.9–10.3)
Chloride: 102 mmol/L (ref 98–111)
Creatinine, Ser: 0.86 mg/dL (ref 0.44–1.00)
GFR calc Af Amer: >60 mL/min (ref >60)
GFR calc non Af Amer: >60 mL/min (ref >60)
Glucose, Bld: 313 mg/dL — ABNORMAL HIGH (ref 70–99)
Potassium: 4.3 mmol/L (ref 3.5–5.1)
Sodium: 138 mmol/L (ref 135–145)
Total Bilirubin: 0.7 mg/dL (ref 0.3–1.2)
Total Protein: 6.2 g/dL — ABNORMAL LOW (ref 6.5–8.1)

## 2019-04-17 LAB — GLUCOSE, CAPILLARY
Glucose-Capillary: 246 mg/dL — ABNORMAL HIGH (ref 70–99)
Glucose-Capillary: 285 mg/dL — ABNORMAL HIGH (ref 70–99)
Glucose-Capillary: 385 mg/dL — ABNORMAL HIGH (ref 70–99)
Glucose-Capillary: 462 mg/dL — ABNORMAL HIGH (ref 70–99)

## 2019-04-17 LAB — C-REACTIVE PROTEIN: CRP: 0.6 mg/dL (ref <1.0)

## 2019-04-17 LAB — CBC
HCT: 45.2 % (ref 36.0–46.0)
Hemoglobin: 15.1 g/dL — ABNORMAL HIGH (ref 12.0–15.0)
MCH: 28.9 pg (ref 26.0–34.0)
MCHC: 33.4 g/dL (ref 30.0–36.0)
MCV: 86.6 fL (ref 80.0–100.0)
Platelets: 130 10*3/uL — ABNORMAL LOW (ref 150–400)
RBC: 5.22 MIL/uL — ABNORMAL HIGH (ref 3.87–5.11)
RDW: 16.1 % — ABNORMAL HIGH (ref 11.5–15.5)
WBC: 9.7 10*3/uL (ref 4.0–10.5)
nRBC: 0 % (ref 0.0–0.2)

## 2019-04-17 LAB — D-DIMER, QUANTITATIVE: D-Dimer, Quant: 0.76 ug/mL-FEU — ABNORMAL HIGH (ref 0.00–0.50)

## 2019-04-17 MED ORDER — INSULIN DETEMIR 100 UNIT/ML ~~LOC~~ SOLN
20.0000 [IU] | Freq: Every day | SUBCUTANEOUS | Status: DC
  Administered 2019-04-17: 20 [IU] via SUBCUTANEOUS
  Filled 2019-04-17: qty 0.2

## 2019-04-17 MED ORDER — ISOSORBIDE MONONITRATE ER 30 MG PO TB24: 15.0000 mg | ORAL_TABLET | Freq: Every day | ORAL | Status: DC

## 2019-04-17 MED ORDER — INSULIN ASPART 100 UNIT/ML ~~LOC~~ SOLN
8.0000 [IU] | Freq: Once | SUBCUTANEOUS | Status: AC
  Administered 2019-04-17: 8 [IU] via SUBCUTANEOUS

## 2019-04-17 MED ORDER — INSULIN ASPART 100 UNIT/ML ~~LOC~~ SOLN
6.0000 [IU] | Freq: Three times a day (TID) | SUBCUTANEOUS | Status: DC
  Administered 2019-04-17 (×3): 6 [IU] via SUBCUTANEOUS

## 2019-04-17 MED ORDER — DILTIAZEM HCL ER COATED BEADS 120 MG PO CP24: 120.0000 mg | ORAL_CAPSULE | Freq: Every day | ORAL | Status: DC

## 2019-04-17 MED ORDER — GLUCERNA SHAKE PO LIQD
237.0000 mL | Freq: Three times a day (TID) | ORAL | Status: DC
  Administered 2019-04-17 – 2019-04-21 (×11): 237 mL via ORAL

## 2019-04-17 MED ORDER — INSULIN GLARGINE 100 UNIT/ML ~~LOC~~ SOLN: 100.0000 [IU] | Freq: Every day | SUBCUTANEOUS | Status: DC

## 2019-04-17 MED ORDER — INSULIN GLARGINE 100 UNIT/ML ~~LOC~~ SOLN
90.0000 [IU] | Freq: Every day | SUBCUTANEOUS | Status: DC
  Administered 2019-04-17: 90 [IU] via SUBCUTANEOUS
  Filled 2019-04-17 (×2): qty 0.9

## 2019-04-17 NOTE — Progress Notes (Signed)
Patient change in condition, heart rate decreased briefly to 39, then remain consistently in the 40's 47 to 49 bpm. Physician notified of change. Current heart rate 54 bpm. Patient SPO2 93% on 9 liters HFNC. No change on SPO2 recently.

## 2019-04-17 NOTE — Plan of Care (Addendum)
Patient continues with intermittent confusion but calm,cooperative and pleasant today. Patient  up to chair with max assist x 2, watching television, using  incentive spirometry with minimal instruction, O2 8 lpm via HFNC, heart rate in the 50's, no distress noted.    Problem: Education: Goal: Knowledge of risk factors and measures for prevention of condition will improve Outcome: Progressing   Problem: Coping: Goal: Psychosocial and spiritual needs will be supported Outcome: Progressing   Problem: Respiratory: Goal: Will maintain a patent airway Description: Currently receiving 4 liters [place] with sats in the upper 90's, pt will pull off nasal cannula, sat 93%. Respirations are non labored, diminished and clear breath sounds.  Outcome: Progressing Goal: Complications related to the disease process, condition or treatment will be avoided or minimized Outcome: Progressing

## 2019-04-17 NOTE — Progress Notes (Signed)
PROGRESS NOTE    Kara Ortega  [id number] DOB: 17-Dec-1929 DOA: 04/13/2019 PCP: Carol Ada, MD     Brief Narrative:  86 year old female with past medical history of dementia, Alzheimer type, chronic kidney disease stage IV, chronic obstructive pulmonary disease, diabetes mellitus type 2, hypertension, previous stroke with left-sided hemiparesis, who presented with acute onset of shortness of breath from nursing facility.  Was in ED 3 days prior to admission diagnosed with COVID-19, he presents with shortness of breath, was hypoxic on presentation, imaging with COVID-19 for pneumonia, she was transferred to [facility] for further management.  Usually with significant hospital delirium, fused, trying to get out of bed to chair, required sitter for a brief period of time, as well she did require some as needed antipsychotic and low-dose Seroquel, mentation has improved, patient oxygen requirement has been increasing steadily.   Subjective:  Patient poor historian, she herself denies any complaints, asking when she can go home, no significant events overnight as discussed with staff .  Assessment & Plan:  Acute hypoxic respiratory failure secondary to pneumonia due to COVID-19 virus infection.   - Acute shortness of breath with hypoxia and chest x-ray finding concerning for multifocal opacity, patient oxygen requirement continued trending, oxygen requirement is 9L this morning. -Discussed with staff, keep encouraging her to use incentive spirometry, . -Continue with IV steroids. -Continue with IV remdesivir. -Procalcitonin within normal limit. -Continue with bronchodilators. -Received convalescent plasma 1/16 -Started on Unasyn given worsening respiratory failure -Patient with known history of diverticulitis, questionable hepatitis, as well her CRP within normal limit, so there is no role for Actemra here. -Continue to monitor inflammatory markers closely.  COVID-19 Labs  Recent Labs     04/15/19 0718 04/15/19 0719 04/16/19 0158 04/17/19 0145  DDIMER 1.06*  --  0.82* 0.76*  CRP  --  0.9 0.7 0.6    Lab Results  Component Value Date   SARSCOV2NAA POSITIVE (A) 04/11/2019   Alzheimer's dementia with behavioral disturbances/hospital delirium - Continue with Exelon -Patient with significant sundowning/hospital delirium initially, this appears to be improved with Seroquel, now her mentation has improved, will continue with Seroquel nightly, hold morning Seroquel. - Continue with home medication including Celexa, alprazolam, Lyrica and Topamax.  Diabetes mellitus type 2 -Continue with insulin sliding scale, she is on Toujeo 80 units subcu nightly, CBGs remain uncontrolled on equal dose of Lantus, this is most likely in setting of COVID-19 infection and steroids, I will go ahead and add Levemir 20 units nightly, and add 5 of NovoLog before meals . -Started on Tradjenta  - Fingersticks before meals and at bedtime - Hypoglycemic protocol  Essential hypertension. -Blood pressure remains soft, I will go ahead and discontinue Cardizem CD especially in the setting of bradycardia, I will decrease her Imdur to 15 mg daily  Chronic atrial fibrillation -Cardizem for heart rate control, but she has some bradycardia, I will go ahead and hold her Cardizem and reassess heart rate tomorrow to see if it needs to be started on a lower dose . -Continue with apixaban for anticoagulation .  Previous CVA with left hemiparesis -Continue with Eliquis and statin     DVT prophylaxis: Apixaban  Code Status: Partial code as discussed with the son, no CPR/defibrillation, but okay with ventilation Family Communication: Son via phone 1/7, will discuss later today Disposition Plan: Pending clinical progression  Consultants:   None  Procedures: None  Objective: Vitals:   04/16/19 1850 04/16/19 2200 04/17/19 0000 04/17/19 0723  BP:  124/65 Marland Kitchen)  101/57 (!) 118/56  Pulse:  (!) 49 (!)  48 (!) 58  Resp:  18 19 (!) 21  Temp: 97.7 F (36.5 C) (!) 97.5 F (36.4 C) 97.8 F (36.6 C) (!) 97.5 F (36.4 C)  TempSrc: Oral Axillary Axillary Oral  SpO2:  93% 93% 91%  Weight:      Height:        Intake/Output Summary (Last 24 hours) at 04/17/2019 1230 Last data filed at 04/17/2019 0630 Gross per 24 hour  Intake 637.5 ml  Output 1400 ml  Net -762.5 ml   Filed Weights   04/14/19 2036  Weight: 96.2 kg    Examination:  Awake Alert, Oriented X 1, she is more conversant today, pleasant, smiling and asking when she can go home. Symmetrical Chest wall movement, diminished at the bases, with scattered rales RRR,No Gallops,Rubs or new Murmurs, No Parasternal Heave (HR around 50s on bedside telemetry monitor) +ve B.Sounds, Abd Soft, No tenderness, No rebound - guarding or rigidity. No Cyanosis, Clubbing or edema, No new Rash or bruise      Data Reviewed: I have personally reviewed following labs and imaging studies  CBC: Recent Labs  Lab 04/11/19 1817 04/13/19 1457 04/15/19 0039 04/16/19 0158 04/17/19 0145  WBC 7.8 7.0 7.8 7.9 9.7  NEUTROABS  --  5.2  --   --   --   HGB 17.1* 16.0* 15.6* 15.7* 15.1*  HCT 52.6* 49.7* 47.8* 47.3* 45.2  MCV 89.2 88.3 88.5 87.1 86.6  PLT 157 134* 136* 138* [id number]   Basic Metabolic Panel: Recent Labs  Lab 04/11/19 1817 04/13/19 1457 04/15/19 0039 04/16/19 0158 04/17/19 0145  NA 133* 136 136 134* 138  K 4.5 3.6 3.8 3.9 4.3  CL 93* 96* 97* 98 102  CO2 26 28 20* 23 25  GLUCOSE 125* 83 215* 379* 313*  BUN 46* 40* 37* 41* 39*  CREATININE 1.41* 1.35* 1.14* 1.05* 0.86  CALCIUM 9.5 9.1 8.8* 8.9 8.9  MG  --   --  1.7  --   --   PHOS  --   --  3.4  --   --    GFR: Estimated Creatinine Clearance: 47.9 mL/min (by C-G formula based on SCr of 0.86 mg/dL). Liver Function Tests: Recent Labs  Lab 04/15/19 0039 04/16/19 0158 04/17/19 0145  AST 60* 54* 32  ALT 5 30 25   ALKPHOS 57 58 56  BILITOT 0.6 0.5 0.7  PROT 6.7 6.4* 6.2*    ALBUMIN 3.5 3.5 3.4*   No results for input(s): LIPASE, AMYLASE in the last 168 hours. No results for input(s): AMMONIA in the last 168 hours. Coagulation Profile: No results for input(s): INR, PROTIME in the last 168 hours. Cardiac Enzymes: No results for input(s): CKTOTAL, CKMB, CKMBINDEX, TROPONINI in the last 168 hours. BNP (last 3 results) No results for input(s): PROBNP in the last 8760 hours. HbA1C: Recent Labs    04/16/19 0158  HGBA1C 8.1*   CBG: Recent Labs  Lab 04/16/19 0739 04/16/19 1209 04/16/19 1527 04/17/19 0813 04/17/19 1141  GLUCAP 302* 354* 350* 246* 285*   Lipid Profile: No results for input(s): CHOL, HDL, LDLCALC, TRIG, CHOLHDL, LDLDIRECT in the last 72 hours. Thyroid Function Tests: No results for input(s): TSH, T4TOTAL, FREET4, T3FREE, THYROIDAB in the last 72 hours. Anemia Panel: No results for input(s): VITAMINB12, FOLATE, FERRITIN, TIBC, IRON, RETICCTPCT in the last 72 hours. Sepsis Labs: Recent Labs  Lab 04/13/19 1457  PROCALCITON 0.12    Recent Results (from  the past 240 hour(s))  Respiratory Panel by RT PCR (Flu A&B, Covid) - Nasopharyngeal Swab     Status: Abnormal   Collection Time: 04/11/19  6:17 PM   Specimen: Nasopharyngeal Swab  Result Value Ref Range Status   SARS Coronavirus 2 by RT PCR POSITIVE (A) NEGATIVE Final    Comment: RESULT CALLED TO, READ BACK BY AND VERIFIED WITH: HAMLETT,T @ 2126 ON [id number] BY POTEAT,S (NOTE) SARS-CoV-2 target nucleic acids are DETECTED. SARS-CoV-2 RNA is generally detectable in upper respiratory specimens  during the acute phase of infection. Positive results are indicative of the presence of the identified virus, but do not rule out bacterial infection or co-infection with other pathogens not detected by the test. Clinical correlation with patient history and other diagnostic information is necessary to determine patient infection status. The expected result is Negative. Fact Sheet for Patients:   PinkCheek.be Fact Sheet for Healthcare Providers: GravelBags.it This test is not yet approved or cleared by the [place] FDA and  has been authorized for detection and/or diagnosis of SARS-CoV-2 by FDA under an Emergency Use Authorization (EUA).  This EUA will remain in effect (meaning this test can be used ) for the duration of  the COVID-19 declaration under Section 564(b)(1) of the Act, 21 U.S.C. section 360bbb-3(b)(1), unless the authorization is terminated or revoked sooner.    Influenza A by PCR NEGATIVE NEGATIVE Final   Influenza B by PCR NEGATIVE NEGATIVE Final    Comment: (NOTE) The Xpert Xpress SARS-CoV-2/FLU/RSV assay is intended as an aid in  the diagnosis of influenza from Nasopharyngeal swab specimens and  should not be used as a sole basis for treatment. Nasal washings and  aspirates are unacceptable for Xpert Xpress SARS-CoV-2/FLU/RSV  testing. Fact Sheet for Patients: PinkCheek.be Fact Sheet for Healthcare Providers: GravelBags.it This test is not yet approved or cleared by the [place] FDA and  has been authorized for detection and/or diagnosis of SARS-CoV-2 by  FDA under an Emergency Use Authorization (EUA). This EUA will remain  in effect (meaning this test can be used) for the duration of the  Covid-19 declaration under Section 564(b)(1) of the Act, 21  U.S.C. section 360bbb-3(b)(1), unless the authorization is  terminated or revoked. Performed at [facility], [place] [address], [place], [place] [address]          Radiology Studies: [facility] Chest Port 1 View  Result Date: 04/16/2019 CLINICAL DATA:  COVID-19 virus infection. EXAM: PORTABLE CHEST 1 VIEW COMPARISON:  April 13, 2019. FINDINGS: Stable cardiomediastinal silhouette. No pneumothorax is noted. Stable bilateral lung opacities are noted, left greater than  right, concerning for multifocal pneumonia. No significant pleural effusion is noted. Bony thorax is unremarkable. IMPRESSION: Stable bilateral lung opacities are noted concerning for multifocal pneumonia. Electronically Signed   By: Marijo Conception M.D.   On: 04/16/2019 08:46        Scheduled Meds: . allopurinol  200 mg Oral Daily  . apixaban  5 mg Oral BID  . vitamin C  1,000 mg Oral Daily  . atorvastatin  10 mg Oral QHS  . budesonide  2 puff Inhalation BID  . cholecalciferol  4,000 Units Oral Daily  . citalopram  20 mg Oral Daily  . dexamethasone (DECADRON) injection  10 mg Intravenous Daily  . feeding supplement (GLUCERNA SHAKE)  237 mL Oral TID BM  . insulin aspart  0-15 Units Subcutaneous TID WC  . insulin aspart  0-5 Units Subcutaneous QHS  . insulin  aspart  6 Units Subcutaneous TID WC  . insulin detemir  20 Units Subcutaneous QHS  . insulin glargine  90 Units Subcutaneous Daily  . [START ON 04/18/2019] isosorbide mononitrate  15 mg Oral Daily  . linagliptin  5 mg Oral Daily  . Melatonin  10 mg Oral QHS  . pregabalin  100 mg Oral Daily   And  . pregabalin  200 mg Oral QHS  . QUEtiapine  25 mg Oral QHS  . rivastigmine  13.3 mg Transdermal Q24H  . topiramate  25 mg Oral QHS  . vitamin B-12  1,000 mcg Oral Daily  . zinc sulfate  220 mg Oral Daily   Continuous Infusions: . ampicillin-sulbactam (UNASYN) IV 3 g (04/17/19 1152)     LOS: 4 days    Phillips Climes, MD [facility]  If 7PM-7AM, please contact night-coverage [email or web address] Password [facility] 04/17/2019, 12:30 PM

## 2019-04-18 DIAGNOSIS — F0390 Unspecified dementia without behavioral disturbance: Secondary | ICD-10-CM

## 2019-04-18 DIAGNOSIS — F0280 Dementia in other diseases classified elsewhere without behavioral disturbance: Secondary | ICD-10-CM

## 2019-04-18 DIAGNOSIS — J9600 Acute respiratory failure, unspecified whether with hypoxia or hypercapnia: Secondary | ICD-10-CM

## 2019-04-18 DIAGNOSIS — G4733 Obstructive sleep apnea (adult) (pediatric): Secondary | ICD-10-CM

## 2019-04-18 DIAGNOSIS — Z515 Encounter for palliative care: Secondary | ICD-10-CM

## 2019-04-18 DIAGNOSIS — U071 COVID-19: Principal | ICD-10-CM

## 2019-04-18 DIAGNOSIS — Z9989 Dependence on other enabling machines and devices: Secondary | ICD-10-CM

## 2019-04-18 LAB — CBC
HCT: 44.8 % (ref 36.0–46.0)
Hemoglobin: 14.6 g/dL (ref 12.0–15.0)
MCH: 28.3 pg (ref 26.0–34.0)
MCHC: 32.6 g/dL (ref 30.0–36.0)
MCV: 86.8 fL (ref 80.0–100.0)
Platelets: 135 10*3/uL — ABNORMAL LOW (ref 150–400)
RBC: 5.16 MIL/uL — ABNORMAL HIGH (ref 3.87–5.11)
RDW: 15.9 % — ABNORMAL HIGH (ref 11.5–15.5)
WBC: 10.5 10*3/uL (ref 4.0–10.5)
nRBC: 0 % (ref 0.0–0.2)

## 2019-04-18 LAB — GLUCOSE, CAPILLARY
Glucose-Capillary: 172 mg/dL — ABNORMAL HIGH (ref 70–99)
Glucose-Capillary: 275 mg/dL — ABNORMAL HIGH (ref 70–99)
Glucose-Capillary: 276 mg/dL — ABNORMAL HIGH (ref 70–99)
Glucose-Capillary: 316 mg/dL — ABNORMAL HIGH (ref 70–99)
Glucose-Capillary: 345 mg/dL — ABNORMAL HIGH (ref 70–99)
Glucose-Capillary: 346 mg/dL — ABNORMAL HIGH (ref 70–99)
Glucose-Capillary: 355 mg/dL — ABNORMAL HIGH (ref 70–99)

## 2019-04-18 LAB — PREPARE FRESH FROZEN PLASMA

## 2019-04-18 LAB — COMPREHENSIVE METABOLIC PANEL
ALT: 208 U/L — ABNORMAL HIGH (ref 0–44)
AST: 281 U/L — ABNORMAL HIGH (ref 15–41)
Albumin: 3.1 g/dL — ABNORMAL LOW (ref 3.5–5.0)
Alkaline Phosphatase: 88 U/L (ref 38–126)
Anion gap: 8 (ref 5–15)
BUN: 40 mg/dL — ABNORMAL HIGH (ref 8–23)
CO2: 27 mmol/L (ref 22–32)
Calcium: 8.9 mg/dL (ref 8.9–10.3)
Chloride: 102 mmol/L (ref 98–111)
Creatinine, Ser: 0.92 mg/dL (ref 0.44–1.00)
GFR calc Af Amer: >60 mL/min (ref >60)
GFR calc non Af Amer: 56 mL/min — ABNORMAL LOW (ref >60)
Glucose, Bld: 374 mg/dL — ABNORMAL HIGH (ref 70–99)
Potassium: 4.2 mmol/L (ref 3.5–5.1)
Sodium: 137 mmol/L (ref 135–145)
Total Bilirubin: 0.6 mg/dL (ref 0.3–1.2)
Total Protein: 5.7 g/dL — ABNORMAL LOW (ref 6.5–8.1)

## 2019-04-18 LAB — BPAM FFP
Blood Product Expiration Date: 202101171428
ISSUE DATE / TIME: 202101161431
Unit Type and Rh: 5100

## 2019-04-18 LAB — D-DIMER, QUANTITATIVE: D-Dimer, Quant: 0.83 ug/mL-FEU — ABNORMAL HIGH (ref 0.00–0.50)

## 2019-04-18 LAB — C-REACTIVE PROTEIN: CRP: 0.7 mg/dL (ref <1.0)

## 2019-04-18 MED ORDER — INSULIN DETEMIR 100 UNIT/ML ~~LOC~~ SOLN
45.0000 [IU] | Freq: Every day | SUBCUTANEOUS | Status: DC
  Filled 2019-04-18: qty 0.45

## 2019-04-18 MED ORDER — INSULIN ASPART 100 UNIT/ML ~~LOC~~ SOLN
18.0000 [IU] | Freq: Three times a day (TID) | SUBCUTANEOUS | Status: DC
  Administered 2019-04-18 (×2): 18 [IU] via SUBCUTANEOUS

## 2019-04-18 MED ORDER — INSULIN GLARGINE 100 UNIT/ML ~~LOC~~ SOLN
80.0000 [IU] | Freq: Every day | SUBCUTANEOUS | Status: DC
  Administered 2019-04-18: 80 [IU] via SUBCUTANEOUS
  Filled 2019-04-18 (×2): qty 0.8

## 2019-04-18 MED ORDER — INSULIN ASPART 100 UNIT/ML ~~LOC~~ SOLN
0.0000 [IU] | Freq: Three times a day (TID) | SUBCUTANEOUS | Status: DC
  Administered 2019-04-18: 15 [IU] via SUBCUTANEOUS
  Administered 2019-04-18: 11 [IU] via SUBCUTANEOUS

## 2019-04-18 MED ORDER — INSULIN ASPART 100 UNIT/ML ~~LOC~~ SOLN
0.0000 [IU] | SUBCUTANEOUS | Status: DC
  Administered 2019-04-18: 11 [IU] via SUBCUTANEOUS
  Administered 2019-04-18: 15 [IU] via SUBCUTANEOUS
  Administered 2019-04-19: 01:00:00 4 [IU] via SUBCUTANEOUS
  Administered 2019-04-19: 05:00:00 3 [IU] via SUBCUTANEOUS
  Administered 2019-04-19: 17:00:00 7 [IU] via SUBCUTANEOUS
  Administered 2019-04-19: 4 [IU] via SUBCUTANEOUS
  Administered 2019-04-19 (×2): 11 [IU] via SUBCUTANEOUS
  Administered 2019-04-20 (×2): 4 [IU] via SUBCUTANEOUS
  Administered 2019-04-20 (×2): 3 [IU] via SUBCUTANEOUS
  Administered 2019-04-20: 7 [IU] via SUBCUTANEOUS
  Administered 2019-04-21: 17:00:00 15 [IU] via SUBCUTANEOUS
  Administered 2019-04-21: 3 [IU] via SUBCUTANEOUS
  Administered 2019-04-21: 12:00:00 7 [IU] via SUBCUTANEOUS

## 2019-04-18 MED ORDER — INSULIN DETEMIR 100 UNIT/ML ~~LOC~~ SOLN
50.0000 [IU] | Freq: Every day | SUBCUTANEOUS | Status: DC
  Administered 2019-04-18 – 2019-04-20 (×3): 50 [IU] via SUBCUTANEOUS
  Filled 2019-04-18 (×4): qty 0.5

## 2019-04-18 MED ORDER — INSULIN GLARGINE 100 UNIT/ML ~~LOC~~ SOLN
50.0000 [IU] | Freq: Every day | SUBCUTANEOUS | Status: DC
  Filled 2019-04-18: qty 0.5

## 2019-04-18 MED ORDER — INSULIN ASPART 100 UNIT/ML ~~LOC~~ SOLN
12.0000 [IU] | Freq: Three times a day (TID) | SUBCUTANEOUS | Status: DC
  Administered 2019-04-18 (×2): 12 [IU] via SUBCUTANEOUS

## 2019-04-18 MED ORDER — INSULIN DETEMIR 100 UNIT/ML ~~LOC~~ SOLN
50.0000 [IU] | Freq: Every day | SUBCUTANEOUS | Status: DC
  Filled 2019-04-18: qty 0.5

## 2019-04-18 MED ORDER — INSULIN ASPART 100 UNIT/ML ~~LOC~~ SOLN
5.0000 [IU] | Freq: Once | SUBCUTANEOUS | Status: AC
  Administered 2019-04-18: 5 [IU] via SUBCUTANEOUS

## 2019-04-18 MED ORDER — INSULIN ASPART 100 UNIT/ML ~~LOC~~ SOLN: 8.0000 [IU] | Freq: Three times a day (TID) | SUBCUTANEOUS | Status: DC

## 2019-04-18 MED ORDER — INSULIN GLARGINE 100 UNIT/ML ~~LOC~~ SOLN
100.0000 [IU] | Freq: Every day | SUBCUTANEOUS | Status: DC
  Filled 2019-04-18: qty 1

## 2019-04-18 NOTE — Progress Notes (Signed)
PROGRESS NOTE    Kara Ortega  [id number] DOB: May 13, 1929 DOA: 04/13/2019 PCP: Carol Ada, MD     Brief Narrative:  86 year old female with past medical history of dementia, Alzheimer type, chronic kidney disease stage IV, chronic obstructive pulmonary disease, diabetes mellitus type 2, hypertension, previous stroke with left-sided hemiparesis, who presented with acute onset of shortness of breath from nursing facility.  Was in ED 3 days prior to admission diagnosed with COVID-19, he presents with shortness of breath, was hypoxic on presentation, imaging with COVID-19 for pneumonia, she was transferred to [facility] for further management.  Usually with significant hospital delirium, fused, trying to get out of bed to chair, required sitter for a brief period of time, as well she did require some as needed antipsychotic and low-dose Seroquel, mentation has improved, patient oxygen requirement has been increasing steadily.   Subjective:  Patient poor historian, pleasant, she denies any complaints once asked, no significant events as discussed with staff .  Assessment & Plan:  Acute hypoxic respiratory failure secondary to pneumonia due to COVID-19 virus infection.   - Acute shortness of breath with hypoxia and chest x-ray finding concerning for multifocal opacity, she remains with significant oxygen requirement, she is on 8 L high flow nasal cannula this morning . -Discussed with staff, keep encouraging her to use incentive spirometry, . -Continue with IV steroids. -Treated with IV remdesivir x5 days -Procalcitonin within normal limit. -Continue with bronchodilators. -Received convalescent plasma 1/16 -to finish  total 5 days of Unasyn -Patient with known history of diverticulitis, questionable hepatitis, as well her CRP within normal limit, so there is no role for Actemra here. -Continue to monitor inflammatory markers closely.  COVID-19 Labs  Recent Labs    04/16/19 0158  04/17/19 0145 04/18/19 0214  DDIMER 0.82* 0.76* 0.83*  CRP 0.7 0.6 0.7    Lab Results  Component Value Date   SARSCOV2NAA POSITIVE (A) 04/11/2019   Alzheimer's dementia with behavioral disturbances/hospital delirium - Continue with Exelon -Patient with significant sundowning/hospital delirium initially, this appears to be improved with Seroquel, now we will continue with Seroquel nightly, to allow for good night sleep and to keep day/night psych are appropriate . - Continue with home medication including Celexa, alprazolam, Lyrica and Topamax.  Diabetes mellitus type 2 -Continue with insulin sliding scale, she is on Toujeo 80 units subcu nightly. -BG remains significantly uncontrolled despite uptitrating her regimen, this is most likely in the setting of COVID-19 and steroid use, will change her Lantus to 80 units daily, 50 units nightly, will continue with resistant sliding scale every 4 hours, and 18 units before meals . -Started on Tradjenta  - Fingersticks before meals and at bedtime - Hypoglycemic protocol  Essential hypertension. -Blood pressure on the lower side, so we will continue to hold Cardizem CD, and I will DC her Imdur.  Chronic atrial fibrillation -She is on Cardizem for heart rate control, given she is significantly bradycardic mainly when she sleeps I will DC her Cardizem currently , continue on telemetry monitor . -Continue with apixaban for anticoagulation .  Previous CVA with left hemiparesis -Continue with Eliquis and statin     DVT prophylaxis: Apixaban  Code Status: Partial code as discussed with the son, no CPR/defibrillation, but okay with ventilation Family Communication: Son via phone 1/17 Disposition Plan: SNF, he remains on 8 L nasal cannula  Consultants:   Palliative   Procedures: None  Objective: Vitals:   04/18/19 0500 04/18/19 0600 04/18/19 0803 04/18/19 1041  BP:  Marland Kitchen)  98/54 121/68   Pulse: (!) 39  (!) 57   Resp: 17  19   Temp:   (!) 97 F (36.1 C)    TempSrc:  Axillary Oral   SpO2: 95%  92% 93%  Weight:      Height:        Intake/Output Summary (Last 24 hours) at 04/18/2019 1228 Last data filed at 04/18/2019 0549 Gross per 24 hour  Intake 480 ml  Output 1550 ml  Net -1070 ml   Filed Weights   04/14/19 2036  Weight: 96.2 kg    Examination:  Awake Alert, demented, easily distracted, but pleasant and communicative  Symmetrical Chest wall movement, improved air entry at the bases  Bradycardic,No Gallops,Rubs or new Murmurs, No Parasternal Heave +ve B.Sounds, Abd Soft, No tenderness, No rebound - guarding or rigidity. No Cyanosis, Clubbing or edema, No new Rash or bruise       Data Reviewed: I have personally reviewed following labs and imaging studies  CBC: Recent Labs  Lab 04/13/19 1457 04/15/19 0039 04/16/19 0158 04/17/19 0145 04/18/19 0214  WBC 7.0 7.8 7.9 9.7 10.5  NEUTROABS 5.2  --   --   --   --   HGB 16.0* 15.6* 15.7* 15.1* 14.6  HCT 49.7* 47.8* 47.3* 45.2 44.8  MCV 88.3 88.5 87.1 86.6 86.8  PLT 134* 136* 138* 130* [id number]*   Basic Metabolic Panel: Recent Labs  Lab 04/13/19 1457 04/15/19 0039 04/16/19 0158 04/17/19 0145 04/18/19 0214  NA 136 136 134* 138 137  K 3.6 3.8 3.9 4.3 4.2  CL 96* 97* 98 102 102  CO2 28 20* 23 25 27   GLUCOSE 83 215* 379* 313* 374*  BUN 40* 37* 41* 39* 40*  CREATININE 1.35* 1.14* 1.05* 0.86 0.92  CALCIUM 9.1 8.8* 8.9 8.9 8.9  MG  --  1.7  --   --   --   PHOS  --  3.4  --   --   --    GFR: Estimated Creatinine Clearance: 44.8 mL/min (by C-G formula based on SCr of 0.92 mg/dL). Liver Function Tests: Recent Labs  Lab 04/15/19 0039 04/16/19 0158 04/17/19 0145 04/18/19 0214  AST 60* 54* 32 281*  ALT 5 30 25  208*  ALKPHOS 57 58 56 88  BILITOT 0.6 0.5 0.7 0.6  PROT 6.7 6.4* 6.2* 5.7*  ALBUMIN 3.5 3.5 3.4* 3.1*   No results for input(s): LIPASE, AMYLASE in the last 168 hours. No results for input(s): AMMONIA in the last 168 hours. Coagulation  Profile: No results for input(s): INR, PROTIME in the last 168 hours. Cardiac Enzymes: No results for input(s): CKTOTAL, CKMB, CKMBINDEX, TROPONINI in the last 168 hours. BNP (last 3 results) No results for input(s): PROBNP in the last 8760 hours. HbA1C: Recent Labs    04/16/19 0158  HGBA1C 8.1*   CBG: Recent Labs  Lab 04/17/19 1536 04/17/19 2229 04/18/19 0149 04/18/19 0412 04/18/19 0802  GLUCAP 385* 462* 355* 345* 275*   Lipid Profile: No results for input(s): CHOL, HDL, LDLCALC, TRIG, CHOLHDL, LDLDIRECT in the last 72 hours. Thyroid Function Tests: No results for input(s): TSH, T4TOTAL, FREET4, T3FREE, THYROIDAB in the last 72 hours. Anemia Panel: No results for input(s): VITAMINB12, FOLATE, FERRITIN, TIBC, IRON, RETICCTPCT in the last 72 hours. Sepsis Labs: Recent Labs  Lab 04/13/19 1457  PROCALCITON 0.12    Recent Results (from the past 240 hour(s))  Respiratory Panel by RT PCR (Flu A&B, Covid) - Nasopharyngeal Swab  Status: Abnormal   Collection Time: 04/11/19  6:17 PM   Specimen: Nasopharyngeal Swab  Result Value Ref Range Status   SARS Coronavirus 2 by RT PCR POSITIVE (A) NEGATIVE Final    Comment: RESULT CALLED TO, READ BACK BY AND VERIFIED WITH: HAMLETT,T @ 2126 ON [id number] BY POTEAT,S (NOTE) SARS-CoV-2 target nucleic acids are DETECTED. SARS-CoV-2 RNA is generally detectable in upper respiratory specimens  during the acute phase of infection. Positive results are indicative of the presence of the identified virus, but do not rule out bacterial infection or co-infection with other pathogens not detected by the test. Clinical correlation with patient history and other diagnostic information is necessary to determine patient infection status. The expected result is Negative. Fact Sheet for Patients:  PinkCheek.be Fact Sheet for Healthcare Providers: GravelBags.it This test is not yet approved or  cleared by the [place] FDA and  has been authorized for detection and/or diagnosis of SARS-CoV-2 by FDA under an Emergency Use Authorization (EUA).  This EUA will remain in effect (meaning this test can be used ) for the duration of  the COVID-19 declaration under Section 564(b)(1) of the Act, 21 U.S.C. section 360bbb-3(b)(1), unless the authorization is terminated or revoked sooner.    Influenza A by PCR NEGATIVE NEGATIVE Final   Influenza B by PCR NEGATIVE NEGATIVE Final    Comment: (NOTE) The Xpert Xpress SARS-CoV-2/FLU/RSV assay is intended as an aid in  the diagnosis of influenza from Nasopharyngeal swab specimens and  should not be used as a sole basis for treatment. Nasal washings and  aspirates are unacceptable for Xpert Xpress SARS-CoV-2/FLU/RSV  testing. Fact Sheet for Patients: PinkCheek.be Fact Sheet for Healthcare Providers: GravelBags.it This test is not yet approved or cleared by the [place] FDA and  has been authorized for detection and/or diagnosis of SARS-CoV-2 by  FDA under an Emergency Use Authorization (EUA). This EUA will remain  in effect (meaning this test can be used) for the duration of the  Covid-19 declaration under Section 564(b)(1) of the Act, 21  U.S.C. section 360bbb-3(b)(1), unless the authorization is  terminated or revoked. Performed at [facility], [place] [address], [place], [place] [address]          Radiology Studies: No results found.      Scheduled Meds: . allopurinol  200 mg Oral Daily  . apixaban  5 mg Oral BID  . vitamin C  1,000 mg Oral Daily  . atorvastatin  10 mg Oral QHS  . budesonide  2 puff Inhalation BID  . cholecalciferol  4,000 Units Oral Daily  . citalopram  20 mg Oral Daily  . dexamethasone (DECADRON) injection  10 mg Intravenous Daily  . feeding supplement (GLUCERNA SHAKE)  237 mL Oral TID BM  . insulin aspart  0-20 Units  Subcutaneous TID WC  . insulin aspart  0-5 Units Subcutaneous QHS  . insulin aspart  12 Units Subcutaneous TID WC  . insulin detemir  45 Units Subcutaneous QHS  . insulin glargine  80 Units Subcutaneous Daily  . linagliptin  5 mg Oral Daily  . Melatonin  10 mg Oral QHS  . pregabalin  100 mg Oral Daily   And  . pregabalin  200 mg Oral QHS  . QUEtiapine  25 mg Oral QHS  . rivastigmine  13.3 mg Transdermal Q24H  . topiramate  25 mg Oral QHS  . vitamin B-12  1,000 mcg Oral Daily  . zinc sulfate  220 mg Oral Daily  Continuous Infusions: . ampicillin-sulbactam (UNASYN) IV 3 g (04/18/19 0549)     LOS: 5 days    Phillips Climes, MD [facility]  If 7PM-7AM, please contact night-coverage [email or web address] Password [facility] 04/18/2019, 12:28 PM

## 2019-04-18 NOTE — Progress Notes (Signed)
Pharmacy Antibiotic Note  Kara Ortega is a 86 y.o. female admitted on 04/13/2019 with Covid pneumonia on D#4 remdesivir.  Pharmacy has been consulted for unasyn for aspiration pneumonia, crcl ~ 40 ml/min.  Today, 04/18/19  Day 3 of 5 per Md notes for Unasyn  WBC and SCr stable   Afebrile  Plan: Continue Unasyn 3g IV Q 6 hrs  Height: 5' (152.4 cm) Weight: 212 lb 1.3 oz (96.2 kg) IBW/kg (Calculated) : 45.5  Temp (24hrs), Avg:97.2 F (36.2 C), Min:96.7 F (35.9 C), Max:97.5 F (36.4 C)  Recent Labs  Lab 04/13/19 1457 04/15/19 0039 04/16/19 0158 04/17/19 0145 04/18/19 0214  WBC 7.0 7.8 7.9 9.7 10.5  CREATININE 1.35* 1.14* 1.05* 0.86 0.92    Estimated Creatinine Clearance: 44.8 mL/min (by C-G formula based on SCr of 0.92 mg/dL).    Allergies  Allergen Reactions  . Avandia [Rosiglitazone] Other (See Comments)    edema  . Benazepril Other (See Comments)    unknown  . Dilaudid [Hydromorphone Hcl] Other (See Comments)    "does not tolerate strong pain medications" per pt  . Fosamax [Alendronate Sodium] Nausea And Vomiting  . Talwin [Pentazocine] Nausea And Vomiting  . Tetanus Toxoids Other (See Comments)    "allergic to something in the tetanus injection" per patient  . Zyrtec [Cetirizine] Other (See Comments)    unknown     Thank you for allowing pharmacy to be a part of this patient's care.  Adrian Saran, PharmD, BCPS 04/18/2019 2:16 PM

## 2019-04-18 NOTE — Evaluation (Signed)
Physical Therapy Evaluation Patient Details Name: Kara Ortega MRN: [id number] DOB: 1929/09/06 Today's Date: 04/18/2019   History of Present Illness  86 year old female with past medical history of dementia, Alzheimer type, chronic kidney disease stage IV, chronic obstructive pulmonary disease, diabetes mellitus type 2, hypertension, previous stroke with left-sided hemiparesis, who presented with acute onset of shortness of breath from nursing facility.  Was in ED 3 days prior to admission diagnosed with COVID-19, he presents with shortness of breath, was hypoxic on presentation, imaging with COVID-19 for pneumonia, she was transferred to [facility] for further management.  Usually with significant hospital delirium, fused, trying to get out of bed to chair, required sitter for a brief period of time, as well she did require some as needed antipsychotic and low-dose Seroquel, mentation has improved, patient oxygen requirement has been increasing steadily.  Clinical Impression  Patient presents as very pleasant but slow to process. Lethargic. Agreed to sit in chair, and Denna Haggard was used to accomplish. She does have UE tremors. She resides in a nursing facility. She was noted to be ambulatory with RW. Required assistance for all ADLs. Generalized weakness currently, both in core and LEs- reduced postural awareness and safety awareness- but according to nursing facility she has only had one fall in the last 6 months. She should benefit from PT to address goals for optimal functional outcomes.     Follow Up Recommendations SNF    Equipment Recommendations  (Used Denna Haggard for sit<>stand to get her to chair.)    Recommendations for Other Services       Precautions / Restrictions Precautions Precautions: Fall Precaution Comments: She has difficulty processing to take any steps once in standing. Also has tremors both UEs. Restrictions Weight Bearing Restrictions: No      Mobility  Bed  Mobility Overal bed mobility: Needs Assistance Bed Mobility: Supine to Sit     Supine to sit: +2 for safety/equipment;+2 for physical assistance;Max assist     General bed mobility comments: Unable to follow one step commands. Max assist x 2 for BLEs and trunk  Transfers Overall transfer level: Needs assistance(Used Denna Haggard) Equipment used: 2 person hand held assist Transfers: Sit to/from Stand Sit to Stand: Mod assist;+2 safety/equipment;+2 physical assistance         General transfer comment: Pt unable to follow commands to pivot over to bedside chair this date. Used sara stedy  Ambulation/Gait Ambulation/Gait assistance: (Unable to safely initiate any steps toward BS chair. Took 2 "scooting " type with right foot, then stopped) Gait Distance (Feet): 2 Feet Assistive device: 2 person hand held assist Gait Pattern/deviations: (only able to scoot with RIGHT foot- but could not follow through with LEFT foot)        Stairs            Wheelchair Mobility    Modified Rankin (Stroke Patients Only)       Balance Overall balance assessment: Needs assistance Sitting-balance support: Feet supported Sitting balance-Leahy Scale: Fair       Standing balance-Leahy Scale: Poor                               Pertinent Vitals/Pain Pain Assessment: No/denies pain    Home Living Family/patient expects to be discharged to:: Assisted living               Home Equipment: Walker - 4 wheels Additional Comments: Pt is from [facility]  Prior Function Level of Independence: Independent with assistive device(s);Needs assistance   Gait / Transfers Assistance Needed: PLOF obtained from staff at [facility]. Per staff, pt ambulates independently with a 4WW and has had 1 fall in the last 6 months.   ADL's / Homemaking Assistance Needed: Pt independent with ADLs with staff providing supervision for showers.         Hand Dominance   Dominant Hand:  Right    Extremity/Trunk Assessment   Upper Extremity Assessment Upper Extremity Assessment: Defer to OT evaluation    Lower Extremity Assessment Lower Extremity Assessment: Defer to PT evaluation;Generalized weakness    Cervical / Trunk Assessment Cervical / Trunk Assessment: Kyphotic  Communication   Communication: Other (comment)(She is slow to respond- generally "yes/no" simple answers.)  Cognition Arousal/Alertness: Lethargic Behavior During Therapy: Flat affect Overall Cognitive Status: History of cognitive impairments - at baseline                                 General Comments: Hx of dementia at baseline   Alzheimer's type      General Comments General comments (skin integrity, edema, etc.): Poor tone and turgor. Skin prone to tears and bruising. She is currently on 8 LPM [place]- HF- No signs or report of shortness of breath but some desatting noted during sit-stand and attempting ambulation    Exercises Other Exercises Other Exercises: Reviewed use of IS and she was able to return demo at 650 x 5. Will continue to encourage use of same.   Assessment/Plan    PT Assessment Patient needs continued PT services  PT Problem List Decreased strength;Decreased balance;Decreased mobility;Decreased activity tolerance       PT Treatment Interventions Gait training;DME instruction;Functional mobility training;Therapeutic activities;Therapeutic exercise;Patient/family education    PT Goals (Current goals can be found in the Care Plan section)  Acute Rehab PT Goals Patient Stated Goal: Pt agreeable to sitting up in chair- could not understand setting a goal beyond this PT Goal Formulation: With patient Potential to Achieve Goals: Fair    Frequency Min 2X/week   Barriers to discharge        Co-evaluation PT/OT/SLP Co-Evaluation/Treatment: Yes Reason for Co-Treatment: For patient/therapist safety PT goals addressed during session: Mobility/safety with  mobility;Other (comment)(Reviewed proper breathing and use of IS.) OT goals addressed during session: ADL's and self-care       AM-PAC PT "6 Clicks" Mobility  Outcome Measure Help needed turning from your back to your side while in a flat bed without using bedrails?: A Lot Help needed moving from lying on your back to sitting on the side of a flat bed without using bedrails?: A Lot Help needed moving to and from a bed to a chair (including a wheelchair)?: A Lot Help needed standing up from a chair using your arms (e.g., wheelchair or bedside chair)?: A Little Help needed to walk in hospital room?: A Lot Help needed climbing 3-5 steps with a railing? : A Lot 6 Click Score: 13    End of Session   Activity Tolerance: Patient tolerated treatment well;Other (comment)(Limited more from cognition and inability to fully process to complete tasks.) Patient left: in chair;with call bell/phone within reach;with bed alarm set Nurse Communication: Mobility status PT Visit Diagnosis: Muscle weakness (generalized) (M62.81)    Time: [id number] PT Time Calculation (min) (ACUTE ONLY): 45 min   Charges:   PT Evaluation $PT Eval Moderate Complexity: 1 Mod PT Treatments $  Therapeutic Exercise: 8-22 mins $Therapeutic Activity: 8-22 mins        Rollen Sox, PT # [phone] CGV cell   Casandra Doffing 04/18/2019, 11:00 AM

## 2019-04-18 NOTE — NC FL2 (Signed)
[place] LEVEL OF CARE SCREENING TOOL     IDENTIFICATION  Patient Name: Kara Ortega Birthdate: [birth year] Sex: female Admission Date (Current Location): 04/13/2019  [facility] and [place] Number:  Herbalist and Address:  [facility], [place] [address], [place], [place] [address]      Provider Number: [id number]  Attending Physician Name and Address:  Elgergawy, Silver Huguenin, MD  Relative Name and Phone Number:       Current Level of Care: Hospital Recommended Level of Care: [place] Prior Approval Number:    Date Approved/Denied:   PASRR Number: [id number]  Discharge Plan: SNF    Current Diagnoses: Patient Active Problem List   Diagnosis Date Noted  . Dementia without behavioral disturbance ([place])   . OSA on CPAP   . Palliative care encounter   . Acute respiratory failure due to COVID-19 ([place]) 04/13/2019  . Essential tremor 09/14/2014  . High cholesterol 05/19/2014  . Persistent atrial fibrillation ([place]) 05/19/2014  . Long-term use of high-risk medication 05/19/2014  . Accident due to mechanical fall without injury 03/24/2014  . Diastolic dysfunction with chronic heart failure ([place]) 03/24/2014  . Sinus bradycardia 03/24/2014  . Hypotension 03/24/2014  . OSA (obstructive sleep apnea) 03/24/2014  . Warfarin anticoagulation 03/24/2014  . Aortic stenosis 03/24/2014  . Mitral stenosis 03/24/2014  . DOE (dyspnea on exertion) 03/24/2014  . Recurrent falls 03/24/2014  . AKI (acute kidney injury) ([place]) 03/23/2014  . Alzheimer's dementia ([place]) 12/26/2013  . Unstable angina ([place]) 09/14/2013  . Diarrhea 09/14/2013  . Chest pain at rest- most likely GI related 09/14/2013  . CHF (congestive heart failure) ([place])   . A-fib ([place])   . Diabetes ([place])   . Dyspnea on exertion 03/25/2013    Orientation RESPIRATION BLADDER Height & Weight     Self  O2(see DC summary) Incontinent Weight: 212 lb 1.3 oz (96.2  kg) Height:  5' (152.4 cm)  BEHAVIORAL SYMPTOMS/MOOD NEUROLOGICAL BOWEL NUTRITION STATUS      Incontinent Diet(2 gram sodium)  AMBULATORY STATUS COMMUNICATION OF NEEDS Skin   Extensive Assist Verbally Normal                       Personal Care Assistance Level of Assistance  Bathing, Feeding, Dressing Bathing Assistance: Maximum assistance Feeding assistance: Limited assistance Dressing Assistance: Maximum assistance     Functional Limitations Info             SPECIAL CARE FACTORS FREQUENCY  PT (By licensed PT), OT (By licensed OT)     PT Frequency: 5x/wk OT Frequency: 5x/wk            Contractures Contractures Info: Not present    Additional Factors Info  Code Status, Allergies, Psychotropic, Insulin Sliding Scale, Isolation Precautions Code Status Info: DNR Allergies Info: Avandia Rosiglitazone, Benazepril, Dilaudid Hydromorphone Hcl, Fosamax Alendronate Sodium, Talwin Pentazocine, Tetanus Toxoids, Zyrtec Cetirizine Psychotropic Info: Celexa 20mg  daily; Seroquel 25mg  daily at bed; Xanax 0.25mg  every 12 hours PRN Insulin Sliding Scale Info: 0-20 every 4 hours; 0-5 units daily at bed; 18 units 3x/day with meals; Levemir 50 units daily at bed; Lantus 80 units daily Isolation Precautions Info: Airborne/Contact Precautions: COVID-19     Current Medications (04/18/2019):  This is the current hospital active medication list Current Facility-Administered Medications  Medication Dose Route Frequency Provider Last Rate Last Admin  . allopurinol (ZYLOPRIM) tablet 200 mg  200 mg Oral Daily Ajuonuma, Waneta Martins, MD  200 mg at 04/18/19 0910  . ALPRAZolam Duanne Moron) tablet 0.25 mg  0.25 mg Oral Q12H PRN Elgergawy, Silver Huguenin, MD   0.25 mg at 04/15/19 2207  . Ampicillin-Sulbactam (UNASYN) 3 g in sodium chloride 0.9 % 100 mL IVPB  3 g Intravenous Q6H BellOliver Hum, RPH 200 mL/hr at 04/18/19 1247 3 g at 04/18/19 1247  . apixaban (ELIQUIS) tablet 5 mg  5 mg Oral BID Elie Confer, MD    5 mg at 04/18/19 0911  . ascorbic acid (VITAMIN C) tablet 1,000 mg  1,000 mg Oral Daily Elie Confer, MD   1,000 mg at 04/18/19 0910  . atorvastatin (LIPITOR) tablet 10 mg  10 mg Oral QHS Elie Confer, MD   10 mg at 04/17/19 2215  . budesonide (PULMICORT) 180 MCG/ACT inhaler 2 puff  2 puff Inhalation BID Elie Confer, MD   2 puff at 04/18/19 0915  . cholecalciferol (VITAMIN D) tablet 4,000 Units  4,000 Units Oral Daily Elie Confer, MD   4,000 Units at 04/18/19 0912  . citalopram (CELEXA) tablet 20 mg  20 mg Oral Daily Elie Confer, MD   20 mg at 04/18/19 0912  . dexamethasone (DECADRON) injection 10 mg  10 mg Intravenous Daily Elgergawy, Silver Huguenin, MD   10 mg at 04/18/19 1022  . feeding supplement (GLUCERNA SHAKE) (GLUCERNA SHAKE) liquid 237 mL  237 mL Oral TID BM Elgergawy, Silver Huguenin, MD   237 mL at 04/18/19 1139  . haloperidol lactate (HALDOL) injection 2 mg  2 mg Intravenous Q6H PRN Elgergawy, Silver Huguenin, MD   2 mg at 04/15/19 1706  . insulin aspart (novoLOG) injection 0-20 Units  0-20 Units Subcutaneous Q4H Elgergawy, Silver Huguenin, MD      . insulin aspart (novoLOG) injection 0-5 Units  0-5 Units Subcutaneous QHS Truddie Hidden, MD   Stopped at 04/17/19 2329  . insulin aspart (novoLOG) injection 18 Units  18 Units Subcutaneous TID WC Elgergawy, Silver Huguenin, MD   18 Units at 04/18/19 1244  . insulin detemir (LEVEMIR) injection 50 Units  50 Units Subcutaneous QHS Elgergawy, Dawood S, MD      . insulin glargine (LANTUS) injection 80 Units  80 Units Subcutaneous Daily Elgergawy, Silver Huguenin, MD   80 Units at 04/18/19 1031  . linagliptin (TRADJENTA) tablet 5 mg  5 mg Oral Daily Elgergawy, Silver Huguenin, MD   5 mg at 04/18/19 0910  . Melatonin TABS 10 mg  10 mg Oral QHS Elie Confer, MD   10 mg at 04/17/19 2212  . pregabalin (LYRICA) capsule 100 mg  100 mg Oral Daily Elgergawy, Silver Huguenin, MD   100 mg at 04/18/19 [id number]   And  . pregabalin (LYRICA) capsule 200 mg  200 mg Oral QHS Elgergawy,  Silver Huguenin, MD   200 mg at 04/16/19 2249  . QUEtiapine (SEROQUEL) tablet 25 mg  25 mg Oral QHS Elgergawy, Silver Huguenin, MD   25 mg at 04/17/19 2215  . rivastigmine (EXELON) 13.3 MG/24HR 13.3 mg  13.3 mg Transdermal Q24H Elie Confer, MD   13.3 mg at 04/18/19 1027  . tiZANidine (ZANAFLEX) tablet 2 mg  2 mg Oral Q6H PRN Elgergawy, Silver Huguenin, MD   2 mg at 04/17/19 2213  . topiramate (TOPAMAX) tablet 25 mg  25 mg Oral QHS Elgergawy, Silver Huguenin, MD   25 mg at 04/17/19 2213  . vitamin B-12 (CYANOCOBALAMIN) tablet 1,000 mcg  1,000 mcg Oral Daily Ajuonuma, Waneta Martins,  MD   1,000 mcg at 04/18/19 0910  . zinc sulfate capsule 220 mg  220 mg Oral Daily Elie Confer, MD   220 mg at 04/18/19 0911     Discharge Medications: Please see discharge summary for a list of discharge medications.  Relevant Imaging Results:  Relevant Lab Results:   Additional Information SS#: [id number]  Geralynn Ochs, LCSW

## 2019-04-18 NOTE — Consult Note (Signed)
   [facility] CM Inpatient Consult   04/18/2019  Kara Ortega Jan 22, 1930 [id number]   Patient screened for high risk score of 27% for unplanned readmission  [place] Management services are needed.  Review of patient's medical record reveals patient is patient is being recommended for a skilled nursing facility, and patient is from [facility]. Insurance [employer].  Plan:  No [facility] Care Management needs for transition of care.  Will sign off.  For questions contact:   Natividad Brood, RN BSN [facility] Liaison  [phone] business mobile phone Toll free office [phone]  Fax number: [phone] [name]

## 2019-04-18 NOTE — Progress Notes (Signed)
Pt appetite is poor. Eating <15/20% of meals. Will continue to monitor.

## 2019-04-18 NOTE — Progress Notes (Signed)
Occupational Therapy Evaluation Patient Details Name: Kara Ortega MRN: [id number] DOB: 12-Apr-1929 Today's Date: 04/18/2019    History of Present Illness 86 year old female with past medical history of dementia, Alzheimer type, chronic kidney disease stage IV, chronic obstructive pulmonary disease, diabetes mellitus type 2, hypertension, previous stroke with left-sided hemiparesis, who presented with acute onset of shortness of breath from nursing facility.  Was in ED 3 days prior to admission diagnosed with COVID-19, he presents with shortness of breath, was hypoxic on presentation, imaging with COVID-19 for pneumonia, she was transferred to [facility] for further management.  Usually with significant hospital delirium, fused, trying to get out of bed to chair, required sitter for a brief period of time, as well she did require some as needed antipsychotic and low-dose Seroquel, mentation has improved, patient oxygen requirement has been increasing steadily.   Clinical Impression   PTA pt lived at [facility], independent with mobility and majority of self-care tasks. Per staff, pt requires supervision for showers and ambulates independently with a 4WW. Pt does not use oxygen at home and is currently on 8L HFNC. Pt currently requires supervision to max assist for self-care and functional transfer tasks. Pt tolerated sitting edge of bed ~20 min with supervision. Pt tolerated standing two times this date with mod hand held assist x 2. Pt unable to comprehend one step instructions to pivot over to bedside chair. Pt noted to be incontinent of urine in standing requiring max assist to get cleaned up. Pt transferred over to bedside chair with sara stedy. SpO2 dropped to 89% during activity noting quick return back to 90s once seated. No reports of shortness of breath throughout. Pt demonstrates decreased cognition, strength, endurance, balance, standing tolerance, activity tolerance, and safety awareness  impacting ability to complete self-care and functional transfer tasks. Recommend skilled OT services to address above deficits in order to promote function and prevent further decline. Recommend SNF placement for additional rehab prior to discharge back to ALF.     Follow Up Recommendations  SNF    Equipment Recommendations  Other (comment)(TBD at next venue of care.)    Recommendations for Other Services       Precautions / Restrictions Precautions Precautions: Fall Restrictions Weight Bearing Restrictions: No      Mobility Bed Mobility Overal bed mobility: Needs Assistance Bed Mobility: Supine to Sit     Supine to sit: +2 for safety/equipment;+2 for physical assistance;Max assist     General bed mobility comments: Unable to follow one step commands. Max assist x 2 for BLEs and trunk  Transfers Overall transfer level: Needs assistance Equipment used: 2 person hand held assist Transfers: Sit to/from Stand Sit to Stand: Mod assist;+2 safety/equipment;+2 physical assistance         General transfer comment: Pt unable to follow commands to pivot over to bedside chair this date. Used sara stedy    Balance Overall balance assessment: Needs assistance Sitting-balance support: Feet supported Sitting balance-Leahy Scale: Fair       Standing balance-Leahy Scale: Poor                             ADL either performed or assessed with clinical judgement   ADL Overall ADL's : Needs assistance/impaired Eating/Feeding: Minimal assistance;Sitting   Grooming: Minimal assistance;Sitting   Upper Body Bathing: Minimal assistance;Moderate assistance;Sitting   Lower Body Bathing: Maximal assistance;Sit to/from stand;Sitting/lateral leans   Upper Body Dressing : Minimal assistance;Moderate assistance;Sitting   Lower  Body Dressing: Maximal assistance;Sit to/from stand;Sitting/lateral leans   Toilet Transfer: Stand-pivot;+2 for safety/equipment;+2 for physical  assistance;Maximal assistance;BSC   Toileting- Clothing Manipulation and Hygiene: Maximal assistance;Sit to/from stand       Functional mobility during ADLs: +2 for physical assistance;+2 for safety/equipment;Maximal assistance General ADL Comments: Pt able to stand with mod assist x 2 for balance and safety. Pt unable to follow one step commands to transfer to bedside chair. Used sara stedy.     Vision         Perception     Praxis      Pertinent Vitals/Pain Pain Assessment: No/denies pain     Hand Dominance Right   Extremity/Trunk Assessment Upper Extremity Assessment Upper Extremity Assessment: Generalized weakness   Lower Extremity Assessment Lower Extremity Assessment: Defer to PT evaluation       Communication Communication Communication: Other (comment)(Quiet voice)   Cognition Arousal/Alertness: Lethargic Behavior During Therapy: Flat affect Overall Cognitive Status: History of cognitive impairments - at baseline                                 General Comments: Hx of dementia at baseline   General Comments  Pt on 8L HFNC with SpO2 decreasing to 89% during activity. Quick return back to 90s. No reports of SOB.    Exercises     Shoulder Instructions      Home Living Family/patient expects to be discharged to:: Assisted living                             Home Equipment: Walker - 4 wheels   Additional Comments: Pt is from [facility]      Prior Functioning/Environment Level of Independence: Independent with assistive device(s);Needs assistance  Gait / Transfers Assistance Needed: PLOF obtained from staff at [facility]. Per staff, pt ambulates independently with a 4WW and has had 1 fall in the last 6 months.  ADL's / Homemaking Assistance Needed: Pt independent with ADLs with staff providing supervision for showers.             OT Problem List: Decreased strength;Decreased activity tolerance;Impaired balance (sitting  and/or standing);Decreased safety awareness;Cardiopulmonary status limiting activity      OT Treatment/Interventions: Self-care/ADL training;Therapeutic exercise;Neuromuscular education;Energy conservation;DME and/or AE instruction;Therapeutic activities;Patient/family education;Balance training    OT Goals(Current goals can be found in the care plan section) Acute Rehab OT Goals Patient Stated Goal: Pt agreeable to sitting up in chair Time For Goal Achievement: 05/02/19 Potential to Achieve Goals: Good ADL Goals Pt Will Perform Eating: with set-up;sitting Pt Will Perform Grooming: with set-up;sitting Pt Will Perform Lower Body Bathing: with min assist;sitting/lateral leans;sit to/from stand Pt Will Perform Lower Body Dressing: with min assist;sitting/lateral leans;sit to/from stand Pt Will Transfer to Toilet: with min assist;bedside commode Pt Will Perform Toileting - Clothing Manipulation and hygiene: with min assist;sit to/from stand;sitting/lateral leans Additional ADL Goal #1: Pt to tolerate standing up to 5 min with min assist and SpO2 maintaining in 90s, in preparation for ADLs.  OT Frequency: Min 2X/week   Barriers to D/C:            Co-evaluation PT/OT/SLP Co-Evaluation/Treatment: Yes Reason for Co-Treatment: Complexity of the patient's impairments (multi-system involvement);Necessary to address cognition/behavior during functional activity;For patient/therapist safety;To address functional/ADL transfers   OT goals addressed during session: ADL's and self-care      AM-PAC OT "6 Clicks"  Daily Activity     Outcome Measure Help from another person eating meals?: A Little Help from another person taking care of personal grooming?: A Little Help from another person toileting, which includes using toliet, bedpan, or urinal?: A Lot Help from another person bathing (including washing, rinsing, drying)?: A Lot Help from another person to put on and taking off regular upper body  clothing?: A Lot Help from another person to put on and taking off regular lower body clothing?: A Lot 6 Click Score: 14   End of Session Equipment Utilized During Treatment: Oxygen;Other (comment)(sara stedy) Nurse Communication: Mobility status  Activity Tolerance: Patient limited by fatigue;Patient limited by lethargy(Limited due to cognitive deficits) Patient left: in chair;with call bell/phone within reach;with chair alarm set  OT Visit Diagnosis: Unsteadiness on feet (R26.81);Muscle weakness (generalized) (M62.81)                Time: [id number] OT Time Calculation (min): 45 min Charges:  OT General Charges $OT Visit: 1 Visit OT Evaluation $OT Eval Moderate Complexity: 1 Mod OT Treatments $Self Care/Home Management : 8-22 mins  Mauri Brooklyn OTR/L [phone]   Mauri Brooklyn 04/18/2019, 10:00 AM

## 2019-04-18 NOTE — Progress Notes (Signed)
Inpatient Diabetes Program Recommendations  AACE/ADA: New Consensus Statement on Inpatient Glycemic Control (2015)  Target Ranges:  Prepandial:   less than 140 mg/dL      Peak postprandial:   less than 180 mg/dL (1-2 hours)      Critically ill patients:  140 - 180 mg/dL   Lab Results  Component Value Date   GLUCAP 275 (H) 04/18/2019   HGBA1C 8.1 (H) 04/16/2019    Review of Glycemic Control Results for Kara Ortega, Kara Ortega (MRN [id number]) as of 04/18/2019 09:25  Ref. Range 04/17/2019 08:13 04/17/2019 11:41 04/17/2019 15:36 04/17/2019 22:29 04/18/2019 01:49 04/18/2019 04:12 04/18/2019 08:02  Glucose-Capillary Latest Ref Range: 70 - 99 mg/dL 246 (H)  Novolog 11 units  Lantus 90 units  Tradjenta 5 mg  Decadron 10 mg 285 (H)  Novolog 14 units 385 (H)  Novolog 21 units 462 (H)  Novolog 8 units  Levemir 20 units 355 (H)   345 (H)  Novolog 5 units 275 (H)  Novolog 23 units  Diabetes history: DM 2 Outpatient Diabetes medications: Toujeo 80 units qhs, Victoza 1.2 mg Daily Current orders for Inpatient glycemic control:  Lantus 80 units qam Levemir 45 units qhs Novolog 0-20 units tid + hs Tradjenta 5 mg Daily  Decadron 10 mg Daily A1c 8.1% on 1/16 BUN/Creat: 40/0.92 Glucerna tid between meals  Inpatient Diabetes Program Recommendations:    Noted basal insulin and meal coverage insulin increased.  Based on amount of Novolog given yesterday   -  Consider increasing evening Levemir to 60 units  -  Consider increasing Novolog meal coverage to 18 units tid.  -  Change Novolog Correction to Q4 hours coverage.  Thanks,  Tama Headings RN, MSN, BC-ADM Inpatient Diabetes Coordinator Team Pager [phone] (8a-5p)

## 2019-04-18 NOTE — Plan of Care (Signed)
  Problem: Education: Goal: Knowledge of risk factors and measures for prevention of condition will improve Outcome: Progressing   

## 2019-04-18 NOTE — Consult Note (Signed)
Consultation Note Date: 04/18/2019   Patient Name: Kara Ortega  DOB: 04-Nov-1929  MRN: [id number]  Age / Sex: 86 y.o., female  PCP: Kara Ada, MD Referring Physician: Albertine Patricia, MD  Reason for Consultation: Establishing goals of care and Psychosocial/spiritual support  HPI/Patient Profile: 86 y.o. 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  Unfortunately she has suffered with episodes of hospital delirium, hyperglycemia, bradycardia, elevated transaminases and slow to improve oxygen requirements.   PMT was consulted for goals of care, patient and family support and communication.   Clinical Assessment and Goals of Care:  I have reviewed medical records including EPIC notes, Ortega and imaging, received report from Kara Ortega and the night RN, spoke on the phone with her son Kara Ortega to discuss diagnosis prognosis, [place], disposition and options.  I introduced Palliative Medicine as specialized medical care for people living with serious illness. It focuses on providing relief from the symptoms and stress of a serious illness.   We discussed a brief life review of the patient.  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  In her early 78s she joined the [employer] and worked with new mothers in [place] providing nutrition and medical care.  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  She eventually came back to the [place] and lived in [place] continuing to support the [place] and helping them acclimate to the [place].  Approximately 5 years ago she had some health concerns and moved to [place].    Most recently she has lived at [facility] on [address] and was an Emergency planning/management officer to new residents.  Kara Ortega's social personality was heavily affected by Covid induced isolation.  She has had a difficult time being separated from her family and from being able to socialize with other residents.  Kara Ortega implied that over the last 6 months or so she has become more confused likely due to this isolation.  Kara Ortega also describes confusion and agitation in the evening (sundowning) at [place].  We discussed her current illness and what it means in the larger context of her on-going co-morbidities.  We reviewed her current status and medications.  Kara Ortega is not currently worsening and we are hopeful for improvement.  With maximum assistance she was able to move from bed to chair today.  She is eating approximately 40% of her meals.  I talked with Kara Ortega a bit about what we have seen with other  patients with Covid - their other comorbidities such as dementia rapidly progress even if they recover from Covid.  I expressed concern that her infection is severe and she is still requiring a high level of oxygen.  Perhaps with adding in nightly CPAP her mental status may improve and hopefully her oxygen requirement will decrease.  We talked about disposition when she leaves the hospital.  Kara Ortega anticipates she will need a skilled nursing facility for rehabilitation.  He asks that palliative care outpatient continue to follow her in concert with her primary care physician Dr. Carol Ortega and himself.  The difference between aggressive medical intervention and comfort care was considered in light of the patient's goals of  care.  Kara Ortega has expressed that he and his siblings would want her to undergo ventilation if necessary for a limited period of time.  They do not want her to experience chest compressions or shock if she suffers cardiac arrest.    Hospice and Palliative Care services outpatient were explained and offered.  Questions and concerns were addressed.  The family was encouraged to call with questions or concerns.    Primary Decision Maker:  NEXT OF KIN son Kara Ortega.  Kara Ortega and his siblings each have medical power of attorney and act in concert.  Kara Ortega is in [place] and is the first point of contact.  SUMMARY OF RECOMMENDATIONS    1.  CPAP nightly and when napping.  Discussed with Kara Ortega 2.  Family would like to arrange a regular video call.  This will assist with their mother's orientation. 3.  Outpatient palliative care to follow at next venue 4.  Family anticipates discharge to SNF when medically appropriate 5.  Palliative medicine team will continue to stay abreast of Kara Ortega's progress and be available to the family and the medical team.  Code Status/Advance Care Planning:  Partial.  Okay for intubation.  No CPR or defibrillation.  Additional Recommendations (Limitations, Scope, Preferences):  Full Scope Treatment  Palliative Prophylaxis:   Delirium Protocol  Psycho-social/Spiritual:   Desire for further Chaplaincy support: Not discussed  Prognosis:   Unable to determine.  I am concerned given the unpredictability of COVID-19 particularly in this precious elderly female-she is at high risk for acute decline or death.  Discharge Planning: [facility] for rehab with Palliative care service follow-up      Primary Diagnoses: Present on Admission: . Acute respiratory failure due to COVID-19 [facility])   I have reviewed the medical record, interviewed the patient and family, and examined the patient. The following aspects are pertinent.  Past Medical History:    Diagnosis Date  . A-fib ([place])   . Abnormal heart rhythms   . Alzheimer disease ([place])   . Anginal pain ([place])   . Back pain   . CHF (congestive heart failure) ([place])   . Chronic kidney disease    STAGE 3   . COPD (chronic obstructive pulmonary disease) ([place])   . Depression   . Diabetes ([place])    insulin dependent  . Hypertension   . Leg pain   . Memory loss   . Neuropathy   . Shortness of breath   . Sleep apnea    uses bipap X12 years.  . Stroke [facility])    LEFT SIDE RESIDUAL   Social History   Socioeconomic History  . Marital status: Single    Spouse name: Not on file  . Number of children: 3  . Years of education: Not on file  .  Highest education level: Not on file  Occupational History  . Occupation: retired  Tobacco Use  . Smoking status: Former Smoker    Packs/day: 1.50    Years: 18.00    Pack years: 27.00    Types: Cigarettes    Quit date: 03/31/1968    Years since quitting: 51.0  . Smokeless tobacco: Never Used  Substance and Sexual Activity  . Alcohol use: No    Alcohol/week: 0.0 standard drinks  . Drug use: No  . Sexual activity: Never  Other Topics Concern  . Not on file  Social History Narrative   Not sure of hands   Assisted living   Patient drinks coffe   Social Determinants of Health   Financial Resource Strain:   . Difficulty of Paying Living Expenses: Not on file  Food Insecurity:   . Worried About Charity fundraiser in the Last Year: Not on file  . Ran Out of Food in the Last Year: Not on file  Transportation Needs:   . Lack of Transportation (Medical): Not on file  . Lack of Transportation (Non-Medical): Not on file  Physical Activity:   . Days of Exercise per Week: Not on file  . Minutes of Exercise per Session: Not on file  Stress:   . Feeling of Stress : Not on file  Social Connections:   . Frequency of Communication with Friends and Family: Not on file  . Frequency of Social Gatherings with Friends and Family: Not on file  .  Attends Religious Services: Not on file  . Active Member of Clubs or Organizations: Not on file  . Attends Archivist Meetings: Not on file  . Marital Status: Not on file   Family History  Problem Relation Age of Onset  . Dementia Maternal Grandmother   . Heart disease Mother   . Heart disease Sister   . Alzheimer's disease Sister   . Dementia Maternal Aunt   . Mental illness Other    Scheduled Meds: . allopurinol  200 mg Oral Daily  . apixaban  5 mg Oral BID  . vitamin C  1,000 mg Oral Daily  . atorvastatin  10 mg Oral QHS  . budesonide  2 puff Inhalation BID  . cholecalciferol  4,000 Units Oral Daily  . citalopram  20 mg Oral Daily  . dexamethasone (DECADRON) injection  10 mg Intravenous Daily  . feeding supplement (GLUCERNA SHAKE)  237 mL Oral TID BM  . insulin aspart  0-20 Units Subcutaneous Q4H  . insulin aspart  0-5 Units Subcutaneous QHS  . insulin aspart  18 Units Subcutaneous TID WC  . insulin detemir  50 Units Subcutaneous QHS  . insulin glargine  80 Units Subcutaneous Daily  . linagliptin  5 mg Oral Daily  . Melatonin  10 mg Oral QHS  . pregabalin  100 mg Oral Daily   And  . pregabalin  200 mg Oral QHS  . QUEtiapine  25 mg Oral QHS  . rivastigmine  13.3 mg Transdermal Q24H  . topiramate  25 mg Oral QHS  . vitamin B-12  1,000 mcg Oral Daily  . zinc sulfate  220 mg Oral Daily   Continuous Infusions: . ampicillin-sulbactam (UNASYN) IV 3 g (04/18/19 1247)   PRN Meds:.ALPRAZolam, haloperidol lactate, tiZANidine Allergies  Allergen Reactions  . Avandia [Rosiglitazone] Other (See Comments)    edema  . Benazepril Other (See Comments)    unknown  . Dilaudid [Hydromorphone Hcl] Other (See Comments)    "  does not tolerate strong pain medications" per pt  . Fosamax [Alendronate Sodium] Nausea And Vomiting  . Talwin [Pentazocine] Nausea And Vomiting  . Tetanus Toxoids Other (See Comments)    "allergic to something in the tetanus injection" per patient  .  Zyrtec [Cetirizine] Other (See Comments)    unknown     Vital Signs: BP 121/68 (BP Location: Right Wrist)   Pulse (!) 57   Temp (!) 96.7 F (35.9 C) (Axillary)   Resp 19   Ht 5' (1.524 m)   Wt 96.2 kg   SpO2 93%   BMI 41.42 kg/m  Pain Scale: 0-10   Pain Score: 0-No pain   SpO2: SpO2: 93 % O2 Device:SpO2: 93 % O2 Flow Rate: .O2 Flow Rate (L/min): 8 L/min  IO: Intake/output summary:   Intake/Output Summary (Last 24 hours) at 04/18/2019 1434 Last data filed at 04/18/2019 0549 Gross per 24 hour  Intake 480 ml  Output 1550 ml  Net -1070 ml    LBM:   Baseline Weight: Weight: 96.2 kg Most recent weight: Weight: 96.2 kg     Palliative Assessment/Data: 30%     Time In: 1:00 Time Out: 2:30 Time Total: 90 min. Visit consisted of counseling and education dealing with the complex and emotionally intense issues surrounding the need for palliative care and symptom management in the setting of serious and potentially life-threatening illness. Greater than 50%  of this time was spent counseling and coordinating care related to the above assessment and plan.  The above conversation was completed via telephone due to the restrictions during the COVID-19 pandemic. Thorough chart review and discussion with necessary members of the care team was completed as part of assessment. All issues were discussed and addressed but no physical exam was performed.   Signed by: Florentina Jenny, PA-C Palliative Medicine Pager: [phone]  Please contact Palliative Medicine Team phone at [phone] for questions and concerns.  For individual provider: See Shea Evans

## 2019-04-18 NOTE — Progress Notes (Addendum)
Pt had very quiet night, sleepy, but arousable. Followed commands, and was pleasant, A/Ox3,-4. IVs at [facility], Unasyn continues. Pt has been afebrile, but feels warmer than thermometer reads. Pt denies pain or discomfort. SB on monitor, bp's labile, p ox in mid 90's on 8L HFNC. No cough, lungs dim/slight exp wheezing at times. Urine output sufficient, purewick patent, no bowel movement for this shift. Turned and repositioned with help from pt. Areas of extreme ecchymotic areas on left forearm & AC area, as well as right AC area, assumed from IV/blood draw sites. Pt denies discomfort in these areas.  CBG's have been hight [phone], coverage given as per orders. Pt does try to hold cup and assist with ADLS/incontinent care. PA -M. Dellinger updated this am, aware of pts last nights status. Will continue to monitor pt and update with changes/orders/status.

## 2019-04-19 LAB — COMPREHENSIVE METABOLIC PANEL
ALT: 149 U/L — ABNORMAL HIGH (ref 0–44)
AST: 89 U/L — ABNORMAL HIGH (ref 15–41)
Albumin: 2.9 g/dL — ABNORMAL LOW (ref 3.5–5.0)
Alkaline Phosphatase: 73 U/L (ref 38–126)
Anion gap: 9 (ref 5–15)
BUN: 31 mg/dL — ABNORMAL HIGH (ref 8–23)
CO2: 27 mmol/L (ref 22–32)
Calcium: 8.7 mg/dL — ABNORMAL LOW (ref 8.9–10.3)
Chloride: 102 mmol/L (ref 98–111)
Creatinine, Ser: 0.74 mg/dL (ref 0.44–1.00)
GFR calc Af Amer: >60 mL/min (ref >60)
GFR calc non Af Amer: >60 mL/min (ref >60)
Glucose, Bld: 129 mg/dL — ABNORMAL HIGH (ref 70–99)
Potassium: 4.3 mmol/L (ref 3.5–5.1)
Sodium: 138 mmol/L (ref 135–145)
Total Bilirubin: 0.4 mg/dL (ref 0.3–1.2)
Total Protein: 5.4 g/dL — ABNORMAL LOW (ref 6.5–8.1)

## 2019-04-19 LAB — CBC
HCT: 45.6 % (ref 36.0–46.0)
Hemoglobin: 14.8 g/dL (ref 12.0–15.0)
MCH: 28.5 pg (ref 26.0–34.0)
MCHC: 32.5 g/dL (ref 30.0–36.0)
MCV: 87.9 fL (ref 80.0–100.0)
Platelets: 137 10*3/uL — ABNORMAL LOW (ref 150–400)
RBC: 5.19 MIL/uL — ABNORMAL HIGH (ref 3.87–5.11)
RDW: 16 % — ABNORMAL HIGH (ref 11.5–15.5)
WBC: 11 10*3/uL — ABNORMAL HIGH (ref 4.0–10.5)
nRBC: 0 % (ref 0.0–0.2)

## 2019-04-19 LAB — C-REACTIVE PROTEIN: CRP: <0.5 mg/dL (ref <1.0)

## 2019-04-19 LAB — GLUCOSE, CAPILLARY
Glucose-Capillary: 141 mg/dL — ABNORMAL HIGH (ref 70–99)
Glucose-Capillary: 154 mg/dL — ABNORMAL HIGH (ref 70–99)
Glucose-Capillary: 252 mg/dL — ABNORMAL HIGH (ref 70–99)
Glucose-Capillary: 216 mg/dL — ABNORMAL HIGH (ref 70–99)
Glucose-Capillary: 293 mg/dL — ABNORMAL HIGH (ref 70–99)

## 2019-04-19 LAB — D-DIMER, QUANTITATIVE: D-Dimer, Quant: 0.95 ug/mL-FEU — ABNORMAL HIGH (ref 0.00–0.50)

## 2019-04-19 MED ORDER — INSULIN ASPART 100 UNIT/ML ~~LOC~~ SOLN: 12.0000 [IU] | Freq: Three times a day (TID) | SUBCUTANEOUS | Status: DC

## 2019-04-19 MED ORDER — INSULIN GLARGINE 100 UNIT/ML ~~LOC~~ SOLN
70.0000 [IU] | Freq: Every day | SUBCUTANEOUS | Status: DC
  Administered 2019-04-20 – 2019-04-21 (×2): 70 [IU] via SUBCUTANEOUS
  Filled 2019-04-19 (×2): qty 0.7

## 2019-04-19 MED ORDER — INSULIN ASPART 100 UNIT/ML ~~LOC~~ SOLN
12.0000 [IU] | Freq: Three times a day (TID) | SUBCUTANEOUS | Status: DC
  Administered 2019-04-19 – 2019-04-21 (×9): 12 [IU] via SUBCUTANEOUS

## 2019-04-19 NOTE — Plan of Care (Signed)
  Problem: Education: Goal: Knowledge of risk factors and measures for prevention of condition will improve Outcome: Progressing   

## 2019-04-19 NOTE — Progress Notes (Signed)
RT attempted to placed CPAP mask on patient. Patient took CPAP off and did not wear to wear it.

## 2019-04-19 NOTE — Progress Notes (Signed)
PROGRESS NOTE    Kara Ortega  [id number] DOB: [birth year] DOA: 04/13/2019 PCP: Carol Ada, MD     Brief Narrative:  86 year old female with past medical history of dementia, Alzheimer type, chronic kidney disease stage IV, chronic obstructive pulmonary disease, diabetes mellitus type 2, hypertension, previous stroke with left-sided hemiparesis, who presented with acute onset of shortness of breath from nursing facility.  Was in ED 3 days prior to admission diagnosed with COVID-19, he presents with shortness of breath, was hypoxic on presentation, imaging with COVID-19 for pneumonia, she was transferred to [facility] for further management.  Usually with significant hospital delirium, fused, trying to get out of bed to chair, required sitter for a brief period of time, as well she did require some as needed antipsychotic and low-dose Seroquel, mentation has improved, patient oxygen requirement has been increasing steadily.   Subjective:  Patient poor historian, she is pleasant, today she denies any complaints when I have asked there, no significant events as discussed with staff.  Assessment & Plan:  Acute hypoxic respiratory failure secondary to pneumonia due to COVID-19 virus infection.   - Acute shortness of breath with hypoxia and chest x-ray finding concerning for multifocal opacity, oxygen requirement has significantly improved, this morning she is on 2 L nasal cannula . -She is being encouraged by staff to keep using incentive spirometry . -Continue with IV steroids, transition to p.o. once ready for discharge -Treated with IV remdesivir x5 days -Procalcitonin within normal limit. -Continue with bronchodilators. -Received convalescent plasma 1/16 -to finish  total 5 days of Unasyn -Patient with known history of diverticulitis, questionable hepatitis, as well her CRP within normal limit, so there is no role for Actemra here. -Continue to monitor inflammatory markers closely.    COVID-19 Labs  Recent Labs    04/17/19 0145 04/18/19 0214 04/19/19 0410  DDIMER 0.76* 0.83* 0.95*  CRP 0.6 0.7 <0.5    Lab Results  Component Value Date   SARSCOV2NAA POSITIVE (A) 04/11/2019   Alzheimer's dementia with behavioral disturbances/hospital delirium - Continue with Exelon -Patient with significant sundowning/hospital delirium initially, this appears to be improved with Seroquel, now we will continue with Seroquel nightly, to allow for good night sleep and to keep day/night psych are appropriate . - Continue with home medication including Celexa, alprazolam, Lyrica and Topamax.  Diabetes mellitus type 2 -Continue with insulin sliding scale, she is on Toujeo 80 units subcu nightly. -All her CBG is poorly controlled, most likely in the setting of steroid use and COVID-19 infection, her insulin regimen has been adjusted frequently, her CBG is acceptable this morning at 150 range, so I have lowered her daytime Lantus to 70, and evening Lantus to 50 units, and decreased Premeal to 12 units, continue with resistant sliding scale . -Started on Tradjenta  - Fingersticks before meals and at bedtime - Hypoglycemic protocol  Essential hypertension. -Have stopped her Cardizem and Imdur given soft blood pressure, her blood pressure is acceptable off medications currently .  Chronic atrial fibrillation -She is on Cardizem CD for heart rate controlled, it has been stopped given significant bradycardia upon sleeping heart rate in the upper 30s to lower 40s, heart rate has been acceptable in the 50s to 60s range of Cardizem .. -Continue with apixaban for anticoagulation .  Previous CVA with left hemiparesis -Continue with Eliquis and statin  Transaminitis -This is in the setting of COVID-19 infection, monitor closely given she was on remdesivir, it is currently trending down.     DVT  prophylaxis: Apixaban  Code Status: Partial code as discussed with the son, no  CPR/defibrillation, but okay with ventilation Family Communication: Son via phone 1/19 Disposition Plan: SNF, (she is from [place]/ALF, likley will need SNF  Consultants:   Palliative   Procedures: None  Objective: Vitals:   04/19/19 0300 04/19/19 0400 04/19/19 0547 04/19/19 0700  BP: (!) 110/55  115/75 104/73  Pulse: (!) 45 (!) 41  (!) 40  Resp: 17 15  17   Temp:   (!) 97.3 F (36.3 C) 98 F (36.7 C)  TempSrc: Axillary  Axillary Axillary  SpO2: 95% 95%  95%  Weight:      Height:        Intake/Output Summary (Last 24 hours) at 04/19/2019 1351 Last data filed at 04/19/2019 0400 Gross per 24 hour  Intake --  Output 850 ml  Net -850 ml   Filed Weights   04/14/19 2036  Weight: 96.2 kg    Examination:  Awake Alert, demented, easily distracted, but pleasant and communicative  Symmetrical Chest wall movement, Good air movement bilaterally, CTAB RRR,No Gallops,Rubs or new Murmurs, No Parasternal Heave +ve B.Sounds, Abd Soft, No tenderness, No rebound - guarding or rigidity. No Cyanosis, Clubbing or edema, No new Rash or bruise        Data Reviewed: I have personally reviewed following labs and imaging studies  CBC: Recent Labs  Lab 04/13/19 1457 04/13/19 1457 04/15/19 0039 04/16/19 0158 04/17/19 0145 04/18/19 0214 04/19/19 0410  WBC 7.0   < > 7.8 7.9 9.7 10.5 11.0*  NEUTROABS 5.2  --   --   --   --   --   --   HGB 16.0*   < > 15.6* 15.7* 15.1* 14.6 14.8  HCT 49.7*   < > 47.8* 47.3* 45.2 44.8 45.6  MCV 88.3   < > 88.5 87.1 86.6 86.8 87.9  PLT 134*   < > 136* 138* 130* 135* 137*   < > = values in this interval not displayed.   Basic Metabolic Panel: Recent Labs  Lab 04/15/19 0039 04/16/19 0158 04/17/19 0145 04/18/19 0214 04/19/19 0410  NA 136 134* 138 137 138  K 3.8 3.9 4.3 4.2 4.3  CL 97* 98 102 102 102  CO2 20* 23 25 27 27   GLUCOSE 215* 379* 313* 374* 129*  BUN 37* 41* 39* 40* 31*  CREATININE 1.14* 1.05* 0.86 0.92 0.74  CALCIUM 8.8* 8.9 8.9  8.9 8.7*  MG 1.7  --   --   --   --   PHOS 3.4  --   --   --   --    GFR: Estimated Creatinine Clearance: 51.5 mL/min (by C-G formula based on SCr of 0.74 mg/dL). Liver Function Tests: Recent Labs  Lab 04/15/19 0039 04/16/19 0158 04/17/19 0145 04/18/19 0214 04/19/19 0410  AST 60* 54* 32 281* 89*  ALT 5 30 25  208* 149*  ALKPHOS 57 58 56 88 73  BILITOT 0.6 0.5 0.7 0.6 0.4  PROT 6.7 6.4* 6.2* 5.7* 5.4*  ALBUMIN 3.5 3.5 3.4* 3.1* 2.9*   No results for input(s): LIPASE, AMYLASE in the last 168 hours. No results for input(s): AMMONIA in the last 168 hours. Coagulation Profile: No results for input(s): INR, PROTIME in the last 168 hours. Cardiac Enzymes: No results for input(s): CKTOTAL, CKMB, CKMBINDEX, TROPONINI in the last 168 hours. BNP (last 3 results) No results for input(s): PROBNP in the last 8760 hours. HbA1C: No results for input(s): HGBA1C in the  last 72 hours. CBG: Recent Labs  Lab 04/18/19 2015 04/18/19 2340 04/19/19 0342 04/19/19 0731 04/19/19 1212  GLUCAP 276* 172* 141* 154* 252*   Lipid Profile: No results for input(s): CHOL, HDL, LDLCALC, TRIG, CHOLHDL, LDLDIRECT in the last 72 hours. Thyroid Function Tests: No results for input(s): TSH, T4TOTAL, FREET4, T3FREE, THYROIDAB in the last 72 hours. Anemia Panel: No results for input(s): VITAMINB12, FOLATE, FERRITIN, TIBC, IRON, RETICCTPCT in the last 72 hours. Sepsis Labs: Recent Labs  Lab 04/13/19 1457  PROCALCITON 0.12    Recent Results (from the past 240 hour(s))  Respiratory Panel by RT PCR (Flu A&B, Covid) - Nasopharyngeal Swab     Status: Abnormal   Collection Time: 04/11/19  6:17 PM   Specimen: Nasopharyngeal Swab  Result Value Ref Range Status   SARS Coronavirus 2 by RT PCR POSITIVE (A) NEGATIVE Final    Comment: RESULT CALLED TO, READ BACK BY AND VERIFIED WITH: HAMLETT,T @ 2126 ON [id number] BY POTEAT,S (NOTE) SARS-CoV-2 target nucleic acids are DETECTED. SARS-CoV-2 RNA is generally  detectable in upper respiratory specimens  during the acute phase of infection. Positive results are indicative of the presence of the identified virus, but do not rule out bacterial infection or co-infection with other pathogens not detected by the test. Clinical correlation with patient history and other diagnostic information is necessary to determine patient infection status. The expected result is Negative. Fact Sheet for Patients:  PinkCheek.be Fact Sheet for Healthcare Providers: GravelBags.it This test is not yet approved or cleared by the [place] FDA and  has been authorized for detection and/or diagnosis of SARS-CoV-2 by FDA under an Emergency Use Authorization (EUA).  This EUA will remain in effect (meaning this test can be used ) for the duration of  the COVID-19 declaration under Section 564(b)(1) of the Act, 21 U.S.C. section 360bbb-3(b)(1), unless the authorization is terminated or revoked sooner.    Influenza A by PCR NEGATIVE NEGATIVE Final   Influenza B by PCR NEGATIVE NEGATIVE Final    Comment: (NOTE) The Xpert Xpress SARS-CoV-2/FLU/RSV assay is intended as an aid in  the diagnosis of influenza from Nasopharyngeal swab specimens and  should not be used as a sole basis for treatment. Nasal washings and  aspirates are unacceptable for Xpert Xpress SARS-CoV-2/FLU/RSV  testing. Fact Sheet for Patients: PinkCheek.be Fact Sheet for Healthcare Providers: GravelBags.it This test is not yet approved or cleared by the [place] FDA and  has been authorized for detection and/or diagnosis of SARS-CoV-2 by  FDA under an Emergency Use Authorization (EUA). This EUA will remain  in effect (meaning this test can be used) for the duration of the  Covid-19 declaration under Section 564(b)(1) of the Act, 21  U.S.C. section 360bbb-3(b)(1), unless the  authorization is  terminated or revoked. Performed at [facility], [place] [address], [place], [place] [address]          Radiology Studies: No results found.      Scheduled Meds: . allopurinol  200 mg Oral Daily  . apixaban  5 mg Oral BID  . vitamin C  1,000 mg Oral Daily  . atorvastatin  10 mg Oral QHS  . budesonide  2 puff Inhalation BID  . cholecalciferol  4,000 Units Oral Daily  . citalopram  20 mg Oral Daily  . dexamethasone (DECADRON) injection  10 mg Intravenous Daily  . feeding supplement (GLUCERNA SHAKE)  237 mL Oral TID BM  . insulin aspart  0-20 Units Subcutaneous Q4H  .  insulin aspart  0-5 Units Subcutaneous QHS  . insulin aspart  12 Units Subcutaneous TID WC  . insulin detemir  50 Units Subcutaneous QHS  . [START ON 04/20/2019] insulin glargine  70 Units Subcutaneous Daily  . linagliptin  5 mg Oral Daily  . Melatonin  10 mg Oral QHS  . pregabalin  100 mg Oral Daily   And  . pregabalin  200 mg Oral QHS  . QUEtiapine  25 mg Oral QHS  . rivastigmine  13.3 mg Transdermal Q24H  . topiramate  25 mg Oral QHS  . vitamin B-12  1,000 mcg Oral Daily  . zinc sulfate  220 mg Oral Daily   Continuous Infusions: . ampicillin-sulbactam (UNASYN) IV 3 g (04/19/19 1330)     LOS: 6 days    Phillips Climes, MD [facility]  If 7PM-7AM, please contact night-coverage [email or web address] Password [email or web address] 04/19/2019, 1:51 PM

## 2019-04-19 NOTE — TOC Progression Note (Signed)
Transition of Care [facility]) - Progression Note    Patient Details  Name: Kara Ortega MRN: [id number] Date of Birth: 03/19/1930  Transition of Care [facility]) CM/SW Contact  [name], RN Phone Number: 04/19/2019, 4:41 PM  Clinical Narrative:   CM spoke with patient's son regarding dc planning.  Son Kara Ortega is in agreement with SNF placement for short term rehab.  [facility] faxed out, no bed offers yet. CM called all pending facilities and requested they review records to see if bed offers can be extended.  CM will follow up and present bed offers once available.     Expected Discharge Plan: [place] Barriers to Discharge: Continued Medical Work up  Expected Discharge Plan and Services Expected Discharge Plan: [place]   Discharge Planning Services: CM Consult Post Acute Care Choice: [place] Living arrangements for the past 2 months: [place]                 DME Arranged: N/A DME Agency: NA       HH Arranged: NA HH Agency: NA         Social Determinants of Health (SDOH) Interventions    Readmission Risk Interventions No flowsheet data found.

## 2019-04-19 NOTE — Progress Notes (Signed)
Placed patient on CPAP via FFM, auto titrte settings max 18.0/min 5.0 cm H20. 3 l O2 bleed in.

## 2019-04-20 LAB — CBC
HCT: 47.7 % — ABNORMAL HIGH (ref 36.0–46.0)
Hemoglobin: 15.5 g/dL — ABNORMAL HIGH (ref 12.0–15.0)
MCH: 28.2 pg (ref 26.0–34.0)
MCHC: 32.5 g/dL (ref 30.0–36.0)
MCV: 86.9 fL (ref 80.0–100.0)
Platelets: 143 10*3/uL — ABNORMAL LOW (ref 150–400)
RBC: 5.49 MIL/uL — ABNORMAL HIGH (ref 3.87–5.11)
RDW: 16.5 % — ABNORMAL HIGH (ref 11.5–15.5)
WBC: 12.7 10*3/uL — ABNORMAL HIGH (ref 4.0–10.5)
nRBC: 0 % (ref 0.0–0.2)

## 2019-04-20 LAB — GLUCOSE, CAPILLARY
Glucose-Capillary: 136 mg/dL — ABNORMAL HIGH (ref 70–99)
Glucose-Capillary: 148 mg/dL — ABNORMAL HIGH (ref 70–99)
Glucose-Capillary: 153 mg/dL — ABNORMAL HIGH (ref 70–99)
Glucose-Capillary: 156 mg/dL — ABNORMAL HIGH (ref 70–99)
Glucose-Capillary: 193 mg/dL — ABNORMAL HIGH (ref 70–99)
Glucose-Capillary: 222 mg/dL — ABNORMAL HIGH (ref 70–99)
Glucose-Capillary: 230 mg/dL — ABNORMAL HIGH (ref 70–99)

## 2019-04-20 LAB — COMPREHENSIVE METABOLIC PANEL
ALT: 156 U/L — ABNORMAL HIGH (ref 0–44)
AST: 105 U/L — ABNORMAL HIGH (ref 15–41)
Albumin: 3 g/dL — ABNORMAL LOW (ref 3.5–5.0)
Alkaline Phosphatase: 67 U/L (ref 38–126)
Anion gap: 8 (ref 5–15)
BUN: 34 mg/dL — ABNORMAL HIGH (ref 8–23)
CO2: 27 mmol/L (ref 22–32)
Calcium: 8.9 mg/dL (ref 8.9–10.3)
Chloride: 102 mmol/L (ref 98–111)
Creatinine, Ser: 0.85 mg/dL (ref 0.44–1.00)
GFR calc Af Amer: >60 mL/min (ref >60)
GFR calc non Af Amer: >60 mL/min (ref >60)
Glucose, Bld: 186 mg/dL — ABNORMAL HIGH (ref 70–99)
Potassium: 4.3 mmol/L (ref 3.5–5.1)
Sodium: 137 mmol/L (ref 135–145)
Total Bilirubin: 0.8 mg/dL (ref 0.3–1.2)
Total Protein: 5.6 g/dL — ABNORMAL LOW (ref 6.5–8.1)

## 2019-04-20 LAB — C-REACTIVE PROTEIN: CRP: 0.6 mg/dL (ref <1.0)

## 2019-04-20 LAB — D-DIMER, QUANTITATIVE: D-Dimer, Quant: 0.93 ug/mL-FEU — ABNORMAL HIGH (ref 0.00–0.50)

## 2019-04-20 MED ORDER — ALPRAZOLAM 0.5 MG PO TABS
0.2500 mg | ORAL_TABLET | Freq: Every day | ORAL | Status: DC
  Administered 2019-04-20: 21:00:00 0.25 mg via ORAL
  Filled 2019-04-20: qty 1

## 2019-04-20 MED ORDER — QUETIAPINE FUMARATE 25 MG PO TABS
12.5000 mg | ORAL_TABLET | Freq: Every evening | ORAL | Status: DC | PRN
  Administered 2019-04-20: 21:00:00 12.5 mg via ORAL
  Filled 2019-04-20: qty 1

## 2019-04-20 NOTE — Plan of Care (Signed)
  Problem: Education: Goal: Knowledge of risk factors and measures for prevention of condition will improve Outcome: Progressing   Problem: Coping: Goal: Psychosocial and spiritual needs will be supported Outcome: Progressing   Problem: Respiratory: Goal: Will maintain a patent airway Description: Currently receiving 4 liters [place] with sats in the upper 90's, pt will pull off nasal cannula, sat 93%. Respirations are non labored, diminished and clear breath sounds.  Outcome: Progressing Goal: Complications related to the disease process, condition or treatment will be avoided or minimized Outcome: Progressing

## 2019-04-20 NOTE — Progress Notes (Signed)
Call from tele sitter, pt pulling off lines/leads, and attempting to get out of bed, haldol given as per orders, will monitor effectiveness

## 2019-04-20 NOTE — Progress Notes (Signed)
Patient transferred back to room 121. Report given to receiving nurse. Personal items with patient.

## 2019-04-20 NOTE — Progress Notes (Signed)
Placed CPAP per order. Auto settings 20.0, 5.0 cm H20.

## 2019-04-20 NOTE — Progress Notes (Signed)
PROGRESS NOTE  Kara Ortega [id number] DOB: 03-10-1930 DOA: 04/13/2019 PCP: Carol Ada, MD   LOS: 7 days   Brief Narrative / Interim history: 86 year old female with history of dementia, chronic kidney disease stage IV, COPD, DM 2, HTN, previous stroke with left-sided hemiparesis who came in with acute onset of shortness of breath from her nursing facility.  She was found to be hypoxic, was positive for COVID-19 and had pneumonia and admitted to [facility].  Hospital course was complicated by confusion and required a sitter for a brief period of time as well as antipsychotic medication with low-dose Seroquel.  Mentation has been improved and currently on 2 L nasal cannula.  Subjective / 24h Interval events: Confused, poor historian, no significant complaints  Assessment & Plan:  Principal Problem Acute Hypoxic Respiratory Failure due to Covid-19 Viral Illness -Chest x-ray with multifocal opacities, her hypoxia is improving and currently on 2 L nasal cannula -She is on IV steroids and will be transitioned to oral once ready for discharge, will need a total of 10 days -Status post remdesivir x5 days -Has received convalescent plasma on 1/16 -She was also placed on Unasyn and will complete a 5-day course with today being the last day -Patient with known history of diverticulitis, questionable hepatitis C as well as CRP was normal so there was no role for Actemra -Inflammatory markers have improved   COVID-19 Labs  Recent Labs    04/18/19 0214 04/19/19 0410 04/20/19 0148  DDIMER 0.83* 0.95* 0.93*  CRP 0.7 <0.5 0.6    Lab Results  Component Value Date   SARSCOV2NAA POSITIVE (A) 04/11/2019    Active Problems Alzheimer's dementia with behavioral disturbances/hospital delirium -Continue Exelon, she was started on Seroquel 25 mg nightly with improvement -Discontinue sitter, she was mildly agitated last night requiring Haldol and hopefully if she is without a sitter throughout  the night she will be able to go to the nursing home tomorrow -Continue home medication including Celexa, alprazolam, Lyrica and Topamax -Patient's son is very concerned about Seroquel as he feels like this is a Clinical biochemist.  He tells me that she normally takes Xanax at home and he would prefer that that would be the first medication.  I will change the Xanax to be scheduled and Seroquel as needed at a lower dose only in case the Xanax is not working  DM2 -Continue sliding scale, long-acting insulin as well as scheduled mealtime -CBGs fair as below  CBG (last 3)  Recent Labs    04/20/19 1132 04/20/19 1153 04/20/19 1547  GLUCAP 230* 222* 156*   Essential hypertension -Stopped her Cardizem and Imdur given soft blood pressure, her blood pressure has remained acceptable off of these medications -Quite hypertensive this afternoon  Chronic atrial fibrillation -She is on Cardizem for heart rate, given significant bradycardia as well as hypotension this has been discontinued.  Currently heart rate is in the 50s and 60s -Continue Eliquis for anticoagulation  Previous stroke with left hemiparesis -Continue Eliquis, statin  Transaminitis -Likely in the setting of COVID-19 viral illness  Hyperlipidemia -Continue atorvastatin  Scheduled Meds: . allopurinol  200 mg Oral Daily  . apixaban  5 mg Oral BID  . vitamin C  1,000 mg Oral Daily  . atorvastatin  10 mg Oral QHS  . budesonide  2 puff Inhalation BID  . cholecalciferol  4,000 Units Oral Daily  . citalopram  20 mg Oral Daily  . dexamethasone (DECADRON) injection  10 mg Intravenous Daily  .  feeding supplement (GLUCERNA SHAKE)  237 mL Oral TID BM  . insulin aspart  0-20 Units Subcutaneous Q4H  . insulin aspart  0-5 Units Subcutaneous QHS  . insulin aspart  12 Units Subcutaneous TID WC  . insulin detemir  50 Units Subcutaneous QHS  . insulin glargine  70 Units Subcutaneous Daily  . linagliptin  5 mg Oral Daily  . Melatonin   10 mg Oral QHS  . pregabalin  100 mg Oral Daily   And  . pregabalin  200 mg Oral QHS  . QUEtiapine  25 mg Oral QHS  . rivastigmine  13.3 mg Transdermal Q24H  . topiramate  25 mg Oral QHS  . vitamin B-12  1,000 mcg Oral Daily  . zinc sulfate  220 mg Oral Daily   Continuous Infusions: . ampicillin-sulbactam (UNASYN) IV 3 g (04/20/19 1230)   PRN Meds:.ALPRAZolam, haloperidol lactate, tiZANidine  DVT prophylaxis: On Eliquis Code Status: Partial code Family Communication: called son Delfino Lovett @ [phone] Patient admitted from: Assisted living Anticipated d/c place: Skilled nursing Barriers to d/c: Improving on her delirium, anticipate discharge tomorrow if she does not require a sitter tonight  Consultants:  Palliative care  Procedures:  none  Microbiology: none  Antimicrobials: none   Objective: Vitals:   04/20/19 0500 04/20/19 0600 04/20/19 0807 04/20/19 1200  BP:   123/76 110/60  Pulse: (!) 37 (!) 52  60  Resp: 17 17  18   Temp:   98.5 F (36.9 C) (!) 97.5 F (36.4 C)  TempSrc:   Oral Axillary  SpO2: 92% 92%  90%  Weight:      Height:        Intake/Output Summary (Last 24 hours) at 04/20/2019 1647 Last data filed at 04/20/2019 1000 Gross per 24 hour  Intake 240 ml  Output 1250 ml  Net -1010 ml   Filed Weights   04/14/19 2036  Weight: 96.2 kg    Examination:  Constitutional: NAD Eyes: no scleral icterus ENMT: Mucous membranes are moist.  Neck: normal, supple Respiratory: clear to auscultation bilaterally, no wheezing, no crackles. Normal respiratory effort.  Cardiovascular: Regular rate and rhythm, no murmurs / rubs / gallops. No LE edema.  Abdomen: non distended, no tenderness. Bowel sounds positive.  Musculoskeletal: no clubbing / cyanosis.  Skin: no rashes Neurologic: non focal  Data Reviewed: I have independently reviewed following labs and imaging studies   CBC: Recent Labs  Lab 04/16/19 0158 04/17/19 0145 04/18/19 0214 04/19/19 0410  04/20/19 0148  WBC 7.9 9.7 10.5 11.0* 12.7*  HGB 15.7* 15.1* 14.6 14.8 15.5*  HCT 47.3* 45.2 44.8 45.6 47.7*  MCV 87.1 86.6 86.8 87.9 86.9  PLT 138* 130* 135* 137* [id number]*   Basic Metabolic Panel: Recent Labs  Lab 04/15/19 0039 04/15/19 0039 04/16/19 0158 04/17/19 0145 04/18/19 0214 04/19/19 0410 04/20/19 0148  NA 136   < > 134* 138 137 138 137  K 3.8   < > 3.9 4.3 4.2 4.3 4.3  CL 97*   < > 98 102 102 102 102  CO2 20*   < > 23 25 27 27 27   GLUCOSE 215*   < > 379* 313* 374* 129* 186*  BUN 37*   < > 41* 39* 40* 31* 34*  CREATININE 1.14*   < > 1.05* 0.86 0.92 0.74 0.85  CALCIUM 8.8*   < > 8.9 8.9 8.9 8.7* 8.9  MG 1.7  --   --   --   --   --   --  PHOS 3.4  --   --   --   --   --   --    < > = values in this interval not displayed.   GFR: Estimated Creatinine Clearance: 48.4 mL/min (by C-G formula based on SCr of 0.85 mg/dL). Liver Function Tests: Recent Labs  Lab 04/16/19 0158 04/17/19 0145 04/18/19 0214 04/19/19 0410 04/20/19 0148  AST 54* 32 281* 89* 105*  ALT 30 25 208* 149* 156*  ALKPHOS 58 56 88 73 67  BILITOT 0.5 0.7 0.6 0.4 0.8  PROT 6.4* 6.2* 5.7* 5.4* 5.6*  ALBUMIN 3.5 3.4* 3.1* 2.9* 3.0*   No results for input(s): LIPASE, AMYLASE in the last 168 hours. No results for input(s): AMMONIA in the last 168 hours. Coagulation Profile: No results for input(s): INR, PROTIME in the last 168 hours. Cardiac Enzymes: No results for input(s): CKTOTAL, CKMB, CKMBINDEX, TROPONINI in the last 168 hours. BNP (last 3 results) No results for input(s): PROBNP in the last 8760 hours. HbA1C: No results for input(s): HGBA1C in the last 72 hours. CBG: Recent Labs  Lab 04/20/19 0517 04/20/19 0807 04/20/19 1132 04/20/19 1153 04/20/19 1547  GLUCAP 153* 148* 230* 222* 156*   Lipid Profile: No results for input(s): CHOL, HDL, LDLCALC, TRIG, CHOLHDL, LDLDIRECT in the last 72 hours. Thyroid Function Tests: No results for input(s): TSH, T4TOTAL, FREET4, T3FREE, THYROIDAB in  the last 72 hours. Anemia Panel: No results for input(s): VITAMINB12, FOLATE, FERRITIN, TIBC, IRON, RETICCTPCT in the last 72 hours. Urine analysis:    Component Value Date/Time   COLORURINE YELLOW 03/23/2014 1904   APPEARANCEUR CLOUDY (A) 03/23/2014 1904   LABSPEC 1.019 03/23/2014 1904   PHURINE 5.0 03/23/2014 1904   GLUCOSEU NEGATIVE 03/23/2014 1904   HGBUR NEGATIVE 03/23/2014 1904   BILIRUBINUR SMALL (A) 03/23/2014 1904   KETONESUR NEGATIVE 03/23/2014 1904   PROTEINUR 30 (A) 03/23/2014 1904   UROBILINOGEN 0.2 03/23/2014 1904   NITRITE NEGATIVE 03/23/2014 1904   LEUKOCYTESUR NEGATIVE 03/23/2014 1904   Sepsis Labs: Invalid input(s): PROCALCITONIN, LACTICIDVEN  Recent Results (from the past 240 hour(s))  Respiratory Panel by RT PCR (Flu A&B, Covid) - Nasopharyngeal Swab     Status: Abnormal   Collection Time: 04/11/19  6:17 PM   Specimen: Nasopharyngeal Swab  Result Value Ref Range Status   SARS Coronavirus 2 by RT PCR POSITIVE (A) NEGATIVE Final    Comment: RESULT CALLED TO, READ BACK BY AND VERIFIED WITH: HAMLETT,T @ 2126 ON [id number] BY POTEAT,S (NOTE) SARS-CoV-2 target nucleic acids are DETECTED. SARS-CoV-2 RNA is generally detectable in upper respiratory specimens  during the acute phase of infection. Positive results are indicative of the presence of the identified virus, but do not rule out bacterial infection or co-infection with other pathogens not detected by the test. Clinical correlation with patient history and other diagnostic information is necessary to determine patient infection status. The expected result is Negative. Fact Sheet for Patients:  PinkCheek.be Fact Sheet for Healthcare Providers: GravelBags.it This test is not yet approved or cleared by the [place] FDA and  has been authorized for detection and/or diagnosis of SARS-CoV-2 by FDA under an Emergency Use Authorization (EUA).  This EUA  will remain in effect (meaning this test can be used ) for the duration of  the COVID-19 declaration under Section 564(b)(1) of the Act, 21 U.S.C. section 360bbb-3(b)(1), unless the authorization is terminated or revoked sooner.    Influenza A by PCR NEGATIVE NEGATIVE Final   Influenza B by PCR  NEGATIVE NEGATIVE Final    Comment: (NOTE) The Xpert Xpress SARS-CoV-2/FLU/RSV assay is intended as an aid in  the diagnosis of influenza from Nasopharyngeal swab specimens and  should not be used as a sole basis for treatment. Nasal washings and  aspirates are unacceptable for Xpert Xpress SARS-CoV-2/FLU/RSV  testing. Fact Sheet for Patients: PinkCheek.be Fact Sheet for Healthcare Providers: GravelBags.it This test is not yet approved or cleared by the [place] FDA and  has been authorized for detection and/or diagnosis of SARS-CoV-2 by  FDA under an Emergency Use Authorization (EUA). This EUA will remain  in effect (meaning this test can be used) for the duration of the  Covid-19 declaration under Section 564(b)(1) of the Act, 21  U.S.C. section 360bbb-3(b)(1), unless the authorization is  terminated or revoked. Performed at [facility], [place] [address] [place], [place] [address]       Radiology Studies: No results found.   Marzetta Board, MD, PhD [facility]  Contact via  [email or web address]

## 2019-04-20 NOTE — Progress Notes (Signed)
Pt resting comfortably, VSS, bp tends to drop with sleeping. Pt SB on monitor, ectopy+, pt denies concerns, or any sx's. Pt arousable, tele off, continue to monitor p oxygenation. Appetite continues fairly well. Plan for possible D/C back to SNF ([facility]?) today. Tele sitter continues. Pt incontinent, purewick in place, patent, no bowel movement this shift. PIV in right hand, Unasyn atb continues. Pt continues to be afebrile. Agitated x 1 last night, haldol given with good results. Pt stable and report to be given to next shift at bedside.

## 2019-04-20 NOTE — TOC Progression Note (Addendum)
Transition of Care [facility]) - Progression Note    Patient Details  Name: Kara Ortega MRN: [id number] Date of Birth: [birth year]  Transition of Care [facility]) CM/SW Contact  Leeroy Cha, RN Phone Number: 04/20/2019, 11:33 AM  Clinical Narrative:    tct-Son-Ricjard/has heard negative thing with the [facility] in [place] and does not perfer for mother to go there.  Explanied that this is the only accpeted snf and that a bed offer has not been made.  Will need new covid test.  Son wants to know results of test before okaying transfer. [facility] in [place] has covid positive bed patient faxed to them. tct-misty at [facility] message left to return call. [facility] will pull cheart and call back. tct-son rick-will consider [facility] if accepted.tcf-[facility]-declined due to behavior problems and meds. tct-rick-notified of choice. tct-Chris Jodell Cipro with [facility]/will check on bed status and call back. TCF-Chris at the [facility] be off tele sitter x24hours.  Will have bed on [id number] if needed.  Expected Discharge Plan: Skilled Nursing Facility Barriers to Discharge: SNF Pending bed offer  Expected Discharge Plan and Services Expected Discharge Plan: [place]   Discharge Planning Services: CM Consult Post Acute Care Choice: [place] Living arrangements for the past 2 months: [place]                 DME Arranged: N/A DME Agency: NA       HH Arranged: NA HH Agency: NA         Social Determinants of Health (SDOH) Interventions    Readmission Risk Interventions No flowsheet data found.

## 2019-04-21 DIAGNOSIS — N184 Chronic kidney disease, stage 4 (severe): Secondary | ICD-10-CM | POA: Diagnosis not present

## 2019-04-21 DIAGNOSIS — F411 Generalized anxiety disorder: Secondary | ICD-10-CM | POA: Diagnosis not present

## 2019-04-21 DIAGNOSIS — I69320 Aphasia following cerebral infarction: Secondary | ICD-10-CM | POA: Diagnosis not present

## 2019-04-21 DIAGNOSIS — G459 Transient cerebral ischemic attack, unspecified: Secondary | ICD-10-CM | POA: Diagnosis not present

## 2019-04-21 DIAGNOSIS — E785 Hyperlipidemia, unspecified: Secondary | ICD-10-CM | POA: Diagnosis not present

## 2019-04-21 DIAGNOSIS — R0902 Hypoxemia: Secondary | ICD-10-CM | POA: Diagnosis not present

## 2019-04-21 DIAGNOSIS — U071 COVID-19: Secondary | ICD-10-CM | POA: Diagnosis not present

## 2019-04-21 DIAGNOSIS — M1A9XX0 Chronic gout, unspecified, without tophus (tophi): Secondary | ICD-10-CM | POA: Diagnosis not present

## 2019-04-21 DIAGNOSIS — F0280 Dementia in other diseases classified elsewhere without behavioral disturbance: Secondary | ICD-10-CM | POA: Diagnosis not present

## 2019-04-21 DIAGNOSIS — E1165 Type 2 diabetes mellitus with hyperglycemia: Secondary | ICD-10-CM | POA: Diagnosis not present

## 2019-04-21 DIAGNOSIS — J449 Chronic obstructive pulmonary disease, unspecified: Secondary | ICD-10-CM | POA: Diagnosis not present

## 2019-04-21 DIAGNOSIS — G309 Alzheimer's disease, unspecified: Secondary | ICD-10-CM | POA: Diagnosis not present

## 2019-04-21 DIAGNOSIS — J1282 Pneumonia due to coronavirus disease 2019: Secondary | ICD-10-CM | POA: Diagnosis not present

## 2019-04-21 DIAGNOSIS — E119 Type 2 diabetes mellitus without complications: Secondary | ICD-10-CM | POA: Diagnosis not present

## 2019-04-21 DIAGNOSIS — G4733 Obstructive sleep apnea (adult) (pediatric): Secondary | ICD-10-CM | POA: Diagnosis not present

## 2019-04-21 DIAGNOSIS — Z794 Long term (current) use of insulin: Secondary | ICD-10-CM | POA: Diagnosis not present

## 2019-04-21 DIAGNOSIS — J9600 Acute respiratory failure, unspecified whether with hypoxia or hypercapnia: Secondary | ICD-10-CM | POA: Diagnosis not present

## 2019-04-21 DIAGNOSIS — F330 Major depressive disorder, recurrent, mild: Secondary | ICD-10-CM | POA: Diagnosis not present

## 2019-04-21 DIAGNOSIS — E118 Type 2 diabetes mellitus with unspecified complications: Secondary | ICD-10-CM | POA: Diagnosis not present

## 2019-04-21 DIAGNOSIS — I482 Chronic atrial fibrillation, unspecified: Secondary | ICD-10-CM | POA: Diagnosis not present

## 2019-04-21 DIAGNOSIS — R531 Weakness: Secondary | ICD-10-CM | POA: Diagnosis not present

## 2019-04-21 DIAGNOSIS — R279 Unspecified lack of coordination: Secondary | ICD-10-CM | POA: Diagnosis not present

## 2019-04-21 DIAGNOSIS — J9601 Acute respiratory failure with hypoxia: Secondary | ICD-10-CM | POA: Diagnosis not present

## 2019-04-21 DIAGNOSIS — R410 Disorientation, unspecified: Secondary | ICD-10-CM | POA: Diagnosis not present

## 2019-04-21 DIAGNOSIS — M6281 Muscle weakness (generalized): Secondary | ICD-10-CM | POA: Diagnosis not present

## 2019-04-21 DIAGNOSIS — Z743 Need for continuous supervision: Secondary | ICD-10-CM | POA: Diagnosis not present

## 2019-04-21 DIAGNOSIS — F0390 Unspecified dementia without behavioral disturbance: Secondary | ICD-10-CM | POA: Diagnosis not present

## 2019-04-21 DIAGNOSIS — I69354 Hemiplegia and hemiparesis following cerebral infarction affecting left non-dominant side: Secondary | ICD-10-CM | POA: Diagnosis not present

## 2019-04-21 DIAGNOSIS — I509 Heart failure, unspecified: Secondary | ICD-10-CM | POA: Diagnosis not present

## 2019-04-21 DIAGNOSIS — I69391 Dysphagia following cerebral infarction: Secondary | ICD-10-CM | POA: Diagnosis not present

## 2019-04-21 LAB — COMPREHENSIVE METABOLIC PANEL
ALT: 156 U/L — ABNORMAL HIGH (ref 0–44)
AST: 79 U/L — ABNORMAL HIGH (ref 15–41)
Albumin: 2.9 g/dL — ABNORMAL LOW (ref 3.5–5.0)
Alkaline Phosphatase: 65 U/L (ref 38–126)
Anion gap: 12 (ref 5–15)
BUN: 32 mg/dL — ABNORMAL HIGH (ref 8–23)
CO2: 28 mmol/L (ref 22–32)
Calcium: 9.2 mg/dL (ref 8.9–10.3)
Chloride: 100 mmol/L (ref 98–111)
Creatinine, Ser: 0.91 mg/dL (ref 0.44–1.00)
GFR calc Af Amer: >60 mL/min (ref >60)
GFR calc non Af Amer: 57 mL/min — ABNORMAL LOW (ref >60)
Glucose, Bld: 116 mg/dL — ABNORMAL HIGH (ref 70–99)
Potassium: 4.4 mmol/L (ref 3.5–5.1)
Sodium: 140 mmol/L (ref 135–145)
Total Bilirubin: 1.1 mg/dL (ref 0.3–1.2)
Total Protein: 5.6 g/dL — ABNORMAL LOW (ref 6.5–8.1)

## 2019-04-21 LAB — GLUCOSE, CAPILLARY
Glucose-Capillary: 117 mg/dL — ABNORMAL HIGH (ref 70–99)
Glucose-Capillary: 119 mg/dL — ABNORMAL HIGH (ref 70–99)
Glucose-Capillary: 133 mg/dL — ABNORMAL HIGH (ref 70–99)
Glucose-Capillary: 121 mg/dL — ABNORMAL HIGH (ref 70–99)
Glucose-Capillary: 203 mg/dL — ABNORMAL HIGH (ref 70–99)
Glucose-Capillary: 336 mg/dL — ABNORMAL HIGH (ref 70–99)

## 2019-04-21 LAB — CBC
HCT: 49.3 % — ABNORMAL HIGH (ref 36.0–46.0)
Hemoglobin: 15.9 g/dL — ABNORMAL HIGH (ref 12.0–15.0)
MCH: 28.2 pg (ref 26.0–34.0)
MCHC: 32.3 g/dL (ref 30.0–36.0)
MCV: 87.6 fL (ref 80.0–100.0)
Platelets: 163 10*3/uL (ref 150–400)
RBC: 5.63 MIL/uL — ABNORMAL HIGH (ref 3.87–5.11)
RDW: 16.8 % — ABNORMAL HIGH (ref 11.5–15.5)
WBC: 12.7 10*3/uL — ABNORMAL HIGH (ref 4.0–10.5)
nRBC: 0 % (ref 0.0–0.2)

## 2019-04-21 MED ORDER — TRAMADOL HCL 50 MG PO TABS: 25.0000 mg | ORAL_TABLET | Freq: Three times a day (TID) | ORAL | 0 refills | Status: AC | PRN

## 2019-04-21 MED ORDER — DEXAMETHASONE 6 MG PO TABS: 6.0000 mg | ORAL_TABLET | Freq: Every day | ORAL | 0 refills | Status: AC

## 2019-04-21 MED ORDER — ENSURE ENLIVE PO LIQD
237.0000 mL | Freq: Two times a day (BID) | ORAL | Status: DC
  Administered 2019-04-21: 12:00:00 237 mL via ORAL

## 2019-04-21 MED ORDER — ALPRAZOLAM 0.25 MG PO TABS: 0.2500 mg | ORAL_TABLET | Freq: Two times a day (BID) | ORAL | 0 refills | Status: AC | PRN

## 2019-04-21 MED ORDER — TIZANIDINE HCL 2 MG PO TABS: 2.0000 mg | ORAL_TABLET | Freq: Four times a day (QID) | ORAL | 0 refills | Status: DC | PRN

## 2019-04-21 MED ORDER — LOPERAMIDE HCL 2 MG PO CAPS
2.0000 mg | ORAL_CAPSULE | ORAL | Status: DC | PRN
  Administered 2019-04-21 (×3): 2 mg via ORAL
  Filled 2019-04-21 (×3): qty 1

## 2019-04-21 NOTE — Progress Notes (Signed)
Transport to [facility]

## 2019-04-21 NOTE — Care Management Important Message (Addendum)
Important Message  Patient Details  Name: Kara Ortega MRN: [id number] Date of Birth: [birth year]   Medicare Important Message Given:  Yes - Important Message mailed due to current National Emergency   Verbal consent obtained due to current National Emergency  Relationship to patient: Child Contact Name: Breana Harville Call Date: 04/21/19  Time: 1500 Phone: [phone], [phone] Outcome: No Answer/Busy Important Message mailed to: Patient address on file      Tommy Medal 04/21/2019, 3:01 PM

## 2019-04-21 NOTE — Discharge Summary (Signed)
Physician Discharge Summary  Kara Ortega [id number] DOB: 07/03/1929 DOA: 04/13/2019  PCP: Carol Ada, MD  Admit date: 04/13/2019 Discharge date: 04/21/2019  Admitted From: SNF Disposition:  SNF  Recommendations for Outpatient Follow-up:  1. Follow up with PCP in 1-2 weeks 2. Please obtain BMP/CBC in one week  Home Health: none Equipment/Devices: none  Discharge Condition: stable CODE STATUS: Partial (no CPR) Diet recommendation: regular  HPI: Per admitting MD, Chief Complaint: Acute shortness of breath HPI: 86 year old female with past medical history of dementia, Alzheimer type, chronic kidney disease stage IV, chronic obstructive pulmonary disease, diabetes mellitus type 2, hypertension, previous stroke with left-sided hemiparesis, who presented with acute onset of shortness of breath from nursing facility. Patient was seen at the ED last 3 days and diagnosed with COVID-19 virus infection without any respiratory symptoms and discharged back to nursing facility. Today patient developed acute onset of shortness of breath. Patient is fully alert and only oriented to person.  Has significant cognitive impairment from Alzheimer dementia. History was obtained from medical records and ED provider. No reported fever, chills, nausea, vomiting or diarrhea and patient denies same. In the ED, patient was found to be tachypneic with respiratory rate 27, She was saturating in the high 80s, requiring 4 L O2 by nasal cannula to maintain saturation greater than 90%. Chest x-ray showed subtle hazy airspace opacity in the mid to lower lung consistent with multifocal Covid 19 pneumonia. Lab was significant for elevation of inflammatory markers. EKG showed no acute ST-T wave changes. Hospitalist team was asked to see and admit patient for further management. Patient will be admitted alcohol withdrawal and started on remdesivir and dexamethasone. I will start her on empiric ceftriaxone and  azithromycin for possible secondary bacterial superinfection. Expected length of stay is more than 2 nights.    Hospital Course / Discharge diagnoses: Acute hypoxic respiratory failure due to COVID-19 viral illness/pneumonia -patient was admitted to the hospital with hypoxia, chest x-ray on admission showed multifocal opacities.  She received treatment with remdesivir, steroids as well as convalescent plasma with clinical improvement, she was able to be weaned off to room air.  There is also concern for a component of aspiration given underlying dementia and she has also completed 5 days of Unasyn while hospitalized.  Given history of diverticulitis, questionable hep C as well as normal CRP she was not given Actemra.  Inflammatory markers have improved as below  COVID-19 Labs  Recent Labs    04/19/19 0410 04/20/19 0148  DDIMER 0.95* 0.93*  CRP <0.5 0.6    Lab Results  Component Value Date   SARSCOV2NAA POSITIVE (A) 04/11/2019   Alzheimer's dementia with behavioral disturbances/hospital delirium -Continue Exelon, initially she had some in-hospital delirium however that has improved, she is on Xanax 0.25 nightly at home, recommend to continue that at the nursing home and potentially increase to 0.5 nightly if indicated.  She was tried on Seroquel but without much help and son prefers to avoid Seroquel.  Continue home medications as below DM2 -resume home medications Essential hypertension -Stopped her Cardizem given soft blood pressure, her blood pressure has remained acceptable off of these medications Chronic atrial fibrillation -She is on Cardizem for heart rate, given significant bradycardia as well as hypotension this has been discontinued.  Currently heart rate is in the 50s and 60s. Continue Eliquis for anticoagulation.  Consider resuming Cardizem if indicated in the future Previous stroke with left hemiparesis -Continue Eliquis, statin Transaminitis -Likely in the setting of  COVID-19 viral illness Hyperlipidemia -Continue atorvastatin   Discharge Instructions   Allergies as of 04/21/2019      Reactions   Avandia [rosiglitazone] Other (See Comments)   edema   Benazepril Other (See Comments)   unknown   Dilaudid [hydromorphone Hcl] Other (See Comments)   "does not tolerate strong pain medications" per pt   Fosamax [alendronate Sodium] Nausea And Vomiting   Talwin [pentazocine] Nausea And Vomiting   Tetanus Toxoids Other (See Comments)   "allergic to something in the tetanus injection" per patient   Zyrtec [cetirizine] Other (See Comments)   unknown      Medication List    STOP taking these medications   diltiazem 180 MG 24 hr capsule Commonly known as: DILACOR XR   diltiazem 180 MG 24 hr capsule Commonly known as: TIAZAC     TAKE these medications   acetaminophen 325 MG tablet Commonly known as: TYLENOL Take 650 mg by mouth every 6 (six) hours as needed.   allopurinol 100 MG tablet Commonly known as: ZYLOPRIM Take 200 mg by mouth daily.   ALPRAZolam 0.25 MG tablet Commonly known as: XANAX Take 1 tablet (0.25 mg total) by mouth every 12 (twelve) hours as needed for anxiety or sleep.   apixaban 5 MG Tabs tablet Commonly known as: Eliquis Take 1 tablet (5 mg total) by mouth 2 (two) times daily.   atorvastatin 10 MG tablet Commonly known as: LIPITOR Take 10 mg by mouth at bedtime.   B-D UF III MINI PEN NEEDLES 31G X 5 MM Misc Generic drug: Insulin Pen Needle U UTD TO INJECT INSULIN   citalopram 20 MG tablet Commonly known as: CELEXA Take 20 mg by mouth daily.   clotrimazole 1 % cream Commonly known as: LOTRIMIN Apply 1 application topically 2 (two) times daily. To toe   dexamethasone 6 MG tablet Commonly known as: DECADRON Take 1 tablet (6 mg total) by mouth daily for 3 days.   diclofenac sodium 1 % Gel Commonly known as: VOLTAREN Apply 4 g topically 4 (four) times daily as needed (For pain.).   docusate sodium 100 MG  capsule Commonly known as: COLACE Take 100 mg by mouth daily as needed for mild constipation.   furosemide 20 MG tablet Commonly known as: LASIX Take 20 mg by mouth daily.   guaiFENesin 600 MG 12 hr tablet Commonly known as: MUCINEX Take by mouth every 12 (twelve) hours as needed for cough or to loosen phlegm.   HM Vitamin D3 100 MCG (4000 UT) Caps Generic drug: Cholecalciferol Take 1,000 Units by mouth daily.   hydrocortisone 25 MG suppository Commonly known as: ANUSOL-HC Place 25 mg rectally 2 (two) times daily as needed for hemorrhoids.   insulin lispro 100 UNIT/ML injection Commonly known as: HUMALOG Inject 16 Units into the skin once.   isosorbide mononitrate 60 MG 24 hr tablet Commonly known as: IMDUR Take 1 tablet (60 mg total) by mouth daily.   ketoconazole 2 % cream Commonly known as: NIZORAL Apply 1 application topically as needed. For rash   loperamide 2 MG tablet Commonly known as: IMODIUM A-D Take 2 mg by mouth 4 (four) times daily as needed for diarrhea or loose stools.   Melatonin 5 MG Tabs Take 10 mg by mouth at bedtime.   methocarbamol 500 MG tablet Commonly known as: ROBAXIN Take 500 mg by mouth every 6 (six) hours as needed (back pain).   nitroGLYCERIN 0.4 MG SL tablet Commonly known as: NITROSTAT Place 1 tablet (0.4  mg total) under the tongue every 5 (five) minutes as needed for chest pain.   nystatin powder Commonly known as: MYCOSTATIN/NYSTOP Apply 1 application topically 2 (two) times daily. Under breast   pregabalin 100 MG capsule Commonly known as: Lyrica Take 1 capsule (100 mg total) by mouth 2 (two) times daily. What changed:   how much to take  additional instructions   psyllium 28 % packet Commonly known as: METAMUCIL SMOOTH TEXTURE Take 1 packet by mouth daily as needed (constipation).   rivastigmine 13.3 MG/24HR Commonly known as: EXELON Place 1 patch (13.3 mg total) onto the skin daily.   spironolactone 25 MG  tablet Commonly known as: ALDACTONE Take 25 mg by mouth 2 (two) times daily.   Systane 0.4-0.3 % Soln Generic drug: Polyethyl Glycol-Propyl Glycol Place 1 drop into both eyes 2 (two) times a day.   tiZANidine 2 MG tablet Commonly known as: ZANAFLEX Take 1 tablet (2 mg total) by mouth every 6 (six) hours as needed for muscle spasms.   topiramate 25 MG tablet Commonly known as: TOPAMAX Take 1 tablet (25 mg total) by mouth at bedtime.   Toujeo SoloStar 300 UNIT/ML Sopn Generic drug: Insulin Glargine (1 Unit Dial) Inject 80 mg into the skin at bedtime.   traMADol 50 MG tablet Commonly known as: ULTRAM Take 0.5 tablets (25 mg total) by mouth every 8 (eight) hours as needed for moderate pain.   Victoza 18 MG/3ML Sopn Generic drug: liraglutide Inject 1.2 mg into the skin daily.   vitamin B-12 1000 MCG tablet Commonly known as: CYANOCOBALAMIN Take 1,000 mcg by mouth daily.   vitamin C 1000 MG tablet Take 1,000 mg by mouth daily.      Consultations:  none  Procedures/Studies:  DG Chest Port 1 View  Result Date: 04/16/2019 CLINICAL DATA:  COVID-19 virus infection. EXAM: PORTABLE CHEST 1 VIEW COMPARISON:  April 13, 2019. FINDINGS: Stable cardiomediastinal silhouette. No pneumothorax is noted. Stable bilateral lung opacities are noted, left greater than right, concerning for multifocal pneumonia. No significant pleural effusion is noted. Bony thorax is unremarkable. IMPRESSION: Stable bilateral lung opacities are noted concerning for multifocal pneumonia. Electronically Signed   By: Marijo Conception M.D.   On: 04/16/2019 08:46   DG Chest Port 1 View  Result Date: 04/13/2019 CLINICAL DATA:  Sob, covid +, hypoxic EXAM: PORTABLE CHEST 1 VIEW COMPARISON:  04/11/2019 FINDINGS: Since the prior study, subtle hazy airspace opacity become apparent left mid and both lower lungs. Cardiac silhouette is normal in size. No convincing mediastinal hilar masses. No pleural effusion or  pneumothorax. Skeletal structures are grossly intact. IMPRESSION: 1. Subtle hazy airspace opacities in the mid to lower lungs consistent with multifocal COVID-19 pneumonia. Electronically Signed   By: Lajean Manes M.D.   On: 04/13/2019 16:15   XR Chest Portable  Result Date: 04/11/2019 CLINICAL DATA:  86 year old female with cough. EXAM: PORTABLE CHEST 1 VIEW COMPARISON:  Chest radiograph dated 03/23/2014. FINDINGS: Minimal left lung base linear and streaky densities, likely atelectasis/scarring. No focal consolidation, pleural effusion, or pneumothorax. An 8 mm faint hazy density in the left mid lung field may represent atelectasis. A pulmonary nodule or infiltrate is not excluded. Attention on follow-up imaging recommended. Stable cardiomediastinal silhouette. There is calcification of the mitral annulus as well as atherosclerotic calcification of the aorta. No acute osseous pathology. IMPRESSION: 1. Left lung base linear atelectasis/scarring. No focal consolidation. 2.  Aortic Atherosclerosis (ICD10-I70.0). Electronically Signed   By: Laren Everts.D.  On: 04/11/2019 19:03      Subjective: - no chest pain, shortness of breath, no abdominal pain, nausea or vomiting.   Discharge Exam: BP (!) 152/85 (BP Location: Right Arm)   Pulse (!) 52   Temp (!) 97.4 F (36.3 C) (Oral)   Resp 18   Ht 5' (1.524 m)   Wt 96.2 kg   SpO2 92%   BMI 41.42 kg/m   General: Pt is alert, awake, not in acute distress Cardiovascular: RRR, S1/S2 +, no rubs, no gallops Respiratory: CTA bilaterally, no wheezing, no rhonchi Abdominal: Soft, NT, ND, bowel sounds +   The results of significant diagnostics from this hospitalization (including imaging, microbiology, ancillary and laboratory) are listed below for reference.     Microbiology: Recent Results (from the past 240 hour(s))  Respiratory Panel by RT PCR (Flu A&B, Covid) - Nasopharyngeal Swab     Status: Abnormal   Collection Time: 04/11/19  6:17 PM    Specimen: Nasopharyngeal Swab  Result Value Ref Range Status   SARS Coronavirus 2 by RT PCR POSITIVE (A) NEGATIVE Final    Comment: RESULT CALLED TO, READ BACK BY AND VERIFIED WITH: HAMLETT,T @ 2126 ON [id number] BY POTEAT,S (NOTE) SARS-CoV-2 target nucleic acids are DETECTED. SARS-CoV-2 RNA is generally detectable in upper respiratory specimens  during the acute phase of infection. Positive results are indicative of the presence of the identified virus, but do not rule out bacterial infection or co-infection with other pathogens not detected by the test. Clinical correlation with patient history and other diagnostic information is necessary to determine patient infection status. The expected result is Negative. Fact Sheet for Patients:  PinkCheek.be Fact Sheet for Healthcare Providers: GravelBags.it This test is not yet approved or cleared by the [place] FDA and  has been authorized for detection and/or diagnosis of SARS-CoV-2 by FDA under an Emergency Use Authorization (EUA).  This EUA will remain in effect (meaning this test can be used ) for the duration of  the COVID-19 declaration under Section 564(b)(1) of the Act, 21 U.S.C. section 360bbb-3(b)(1), unless the authorization is terminated or revoked sooner.    Influenza A by PCR NEGATIVE NEGATIVE Final   Influenza B by PCR NEGATIVE NEGATIVE Final    Comment: (NOTE) The Xpert Xpress SARS-CoV-2/FLU/RSV assay is intended as an aid in  the diagnosis of influenza from Nasopharyngeal swab specimens and  should not be used as a sole basis for treatment. Nasal washings and  aspirates are unacceptable for Xpert Xpress SARS-CoV-2/FLU/RSV  testing. Fact Sheet for Patients: PinkCheek.be Fact Sheet for Healthcare Providers: GravelBags.it This test is not yet approved or cleared by the [place] FDA and  has been  authorized for detection and/or diagnosis of SARS-CoV-2 by  FDA under an Emergency Use Authorization (EUA). This EUA will remain  in effect (meaning this test can be used) for the duration of the  Covid-19 declaration under Section 564(b)(1) of the Act, 21  U.S.C. section 360bbb-3(b)(1), unless the authorization is  terminated or revoked. Performed at [facility], [place] [address], [place], [place] [address]      Labs: Basic Metabolic Panel: Recent Labs  Lab 04/15/19 0039 04/16/19 0158 04/17/19 0145 04/18/19 0214 04/19/19 0410 04/20/19 0148 04/21/19 0320  NA 136   < > 138 137 138 137 140  K 3.8   < > 4.3 4.2 4.3 4.3 4.4  CL 97*   < > 102 102 102 102 100  CO2 20*   < >  25 27 27 27 28   GLUCOSE 215*   < > 313* 374* 129* 186* 116*  BUN 37*   < > 39* 40* 31* 34* 32*  CREATININE 1.14*   < > 0.86 0.92 0.74 0.85 0.91  CALCIUM 8.8*   < > 8.9 8.9 8.7* 8.9 9.2  MG 1.7  --   --   --   --   --   --   PHOS 3.4  --   --   --   --   --   --    < > = values in this interval not displayed.   Liver Function Tests: Recent Labs  Lab 04/17/19 0145 04/18/19 0214 04/19/19 0410 04/20/19 0148 04/21/19 0320  AST 32 281* 89* 105* 79*  ALT 25 208* 149* 156* 156*  ALKPHOS 56 88 73 67 65  BILITOT 0.7 0.6 0.4 0.8 1.1  PROT 6.2* 5.7* 5.4* 5.6* 5.6*  ALBUMIN 3.4* 3.1* 2.9* 3.0* 2.9*   CBC: Recent Labs  Lab 04/17/19 0145 04/18/19 0214 04/19/19 0410 04/20/19 0148 04/21/19 0320  WBC 9.7 10.5 11.0* 12.7* 12.7*  HGB 15.1* 14.6 14.8 15.5* 15.9*  HCT 45.2 44.8 45.6 47.7* 49.3*  MCV 86.6 86.8 87.9 86.9 87.6  PLT 130* 135* 137* 143* 163   CBG: Recent Labs  Lab 04/20/19 2044 04/20/19 2310 04/21/19 0508 04/21/19 0743 04/21/19 1124  GLUCAP 117* 119* 133* 121* 203*   Hgb A1c No results for input(s): HGBA1C in the last 72 hours. Lipid Profile No results for input(s): CHOL, HDL, LDLCALC, TRIG, CHOLHDL, LDLDIRECT in the last 72 hours. Thyroid function studies No results  for input(s): TSH, T4TOTAL, T3FREE, THYROIDAB in the last 72 hours.  Invalid input(s): FREET3 Urinalysis    Component Value Date/Time   COLORURINE YELLOW 03/23/2014 1904   APPEARANCEUR CLOUDY (A) 03/23/2014 1904   LABSPEC 1.019 03/23/2014 1904   PHURINE 5.0 03/23/2014 1904   GLUCOSEU NEGATIVE 03/23/2014 1904   HGBUR NEGATIVE 03/23/2014 1904   BILIRUBINUR SMALL (A) 03/23/2014 1904   KETONESUR NEGATIVE 03/23/2014 1904   PROTEINUR 30 (A) 03/23/2014 1904   UROBILINOGEN 0.2 03/23/2014 1904   NITRITE NEGATIVE 03/23/2014 1904   LEUKOCYTESUR NEGATIVE 03/23/2014 1904    FURTHER DISCHARGE INSTRUCTIONS:   Get Medicines reviewed and adjusted: Please take all your medications with you for your next visit with your Primary MD   Laboratory/radiological data: Please request your Primary MD to go over all hospital tests and procedure/radiological results at the follow up, please ask your Primary MD to get all Hospital records sent to his/her office.   In some cases, they will be blood work, cultures and biopsy results pending at the time of your discharge. Please request that your primary care M.D. goes through all the records of your hospital data and follows up on these results.   Also Note the following: If you experience worsening of your admission symptoms, develop shortness of breath, life threatening emergency, suicidal or homicidal thoughts you must seek medical attention immediately by calling 911 or calling your MD immediately  if symptoms less severe.   You must read complete instructions/literature along with all the possible adverse reactions/side effects for all the Medicines you take and that have been prescribed to you. Take any new Medicines after you have completely understood and accpet all the possible adverse reactions/side effects.    Do not drive when taking Pain medications or sleeping medications (Benzodaizepines)   Do not take more than prescribed Pain, Sleep and Anxiety  Medications. It is not advisable to combine anxiety,sleep and pain medications without talking with your primary care practitioner   Special Instructions: If you have smoked or chewed Tobacco  in the last 2 yrs please stop smoking, stop any regular Alcohol  and or any Recreational drug use.   Wear Seat belts while driving.   Please note: You were cared for by a hospitalist during your hospital stay. Once you are discharged, your primary care physician will handle any further medical issues. Please note that NO REFILLS for any discharge medications will be authorized once you are discharged, as it is imperative that you return to your primary care physician (or establish a relationship with a primary care physician if you do not have one) for your post hospital discharge needs so that they can reassess your need for medications and monitor your lab values.  Time coordinating discharge: 40 minutes  SIGNED:  Marzetta Board, MD, PhD 04/21/2019, 1:59 PM

## 2019-04-21 NOTE — Progress Notes (Signed)
Report called to Erline Levine, LPN at [facility] unit. Number given if needed for further questions. Patient cleaned and ready for transport.

## 2019-04-21 NOTE — Discharge Instructions (Signed)
10 Things You Can Do to Manage Your COVID-19 Symptoms at Home If you have possible or confirmed COVID-19: 1. Stay home from work and school. And stay away from other public places. If you must go out, avoid using any kind of public transportation, ridesharing, or taxis. 2. Monitor your symptoms carefully. If your symptoms get worse, call your healthcare provider immediately. 3. Get rest and stay hydrated. 4. If you have a medical appointment, call the healthcare provider ahead of time and tell them that you have or may have COVID-19. 5. For medical emergencies, call 911 and notify the dispatch personnel that you have or may have COVID-19. 6. Cover your cough and sneezes with a tissue or use the inside of your elbow. 7. Wash your hands often with soap and water for at least 20 seconds or clean your hands with an alcohol-based hand sanitizer that contains at least 60% alcohol. 8. As much as possible, stay in a specific room and away from other people in your home. Also, you should use a separate bathroom, if available. If you need to be around other people in or outside of the home, wear a mask. 9. Avoid sharing personal items with other people in your household, like dishes, towels, and bedding. 10. Clean all surfaces that are touched often, like counters, tabletops, and doorknobs. Use household cleaning sprays or wipes according to the label instructions. cdc.gov/coronavirus 09/29/2018 This information is not intended to replace advice given to you by your health care provider. Make sure you discuss any questions you have with your health care provider. Document Revised: 03/03/2019 Document Reviewed: 03/03/2019 Elsevier Patient Education  2020 [employer]  

## 2019-04-21 NOTE — TOC Progression Note (Addendum)
Transition of Care [facility]) - Progression Note    Patient Details  Name: Kara Ortega MRN: [id number] Date of Birth: 12/22/1929  Transition of Care [facility]) CM/SW Contact  Leeroy Cha, RN Phone Number: 04/21/2019, 9:45 AM  Clinical Narrative:    tcf-Chris at the [facility] does have a bed awaiting insurance auth. tct-[employer] check On auth  Reference number [id number]. Will fax clinicals stay approved for 5 days and then will review per [employer].  TCT-Chris at the [place] [facility] are ready for patient. Message to Dr. Samara Snide patient. Medical necessity form completed and printed to unit. tct-Son-Richard made aware of the transfer tct-jackie Edwards RN okay to call for transport. ptar called at [phone]. Expected Discharge Plan: Skilled Nursing Facility Barriers to Discharge: SNF Pending bed offer  Expected Discharge Plan and Services Expected Discharge Plan: [place]   Discharge Planning Services: CM Consult Post Acute Care Choice: [place] Living arrangements for the past 2 months: [place]                 DME Arranged: N/A DME Agency: NA       HH Arranged: NA HH Agency: NA         Social Determinants of Health (SDOH) Interventions    Readmission Risk Interventions No flowsheet data found.

## 2019-04-21 NOTE — Evaluation (Signed)
Physical Therapy Treatment Patient Details Name: Kara Ortega MRN: [id number] DOB: 21-Jul-1929 Today's Date: 04/21/2019    History of Present Illness 86 year old female with past medical history of dementia, Alzheimer type, chronic kidney disease stage IV, chronic obstructive pulmonary disease, diabetes mellitus type 2, hypertension, previous stroke with left-sided hemiparesis, who presented with acute onset of shortness of breath from nursing facility.  Was in ED 3 days prior to admission diagnosed with COVID-19, he presents with shortness of breath, was hypoxic on presentation, imaging with COVID-19 for pneumonia, she was transferred to [facility] for further management.  Usually with significant hospital delirium, fused, trying to get out of bed to chair, required sitter for a brief period of time, as well she did require some as needed antipsychotic and low-dose Seroquel, mentation has improved, patient oxygen requirement has been increasing steadily.    PT Comments    Pleasant and "joking " with therapists today, but still having significant difficulties following any commands. She was mod-max of 2 for all bed mobility and mod - max of 2 for sit<>stand. She actually, once in standing, cannot process taking steps and we had to do quick pivot into bedside chair. Reviewed LE ex and IS with good return demo with verbal and tactile cues. She will continue to be reminded to perform these exercises and encourage out of bed activity. Will need 24/7 supervision at discharge, and presents as still needing SNF.   Follow Up Recommendations  SNF     Equipment Recommendations       Recommendations for Other Services       Precautions / Restrictions Precautions Precautions: Fall Precaution Comments: She has difficulty processing to take any steps once in standing. Also has tremors both UEs. Restrictions Weight Bearing Restrictions: No Other Position/Activity Restrictions: Today she was mod/max of 2 for  all bed mobility and sit<>stand, stand to pivot- she continues to have difficulty in processing to take any steps.    Mobility  Bed Mobility Overal bed mobility: Needs Assistance Bed Mobility: Supine to Sit     Supine to sit: Mod assist;+2 for physical assistance;HOB elevated;Max assist     General bed mobility comments: Cues to initiate task. Difficulty following one step instructions.   Transfers Overall transfer level: Needs assistance Equipment used: 2 person hand held assist Transfers: Sit to/from [facility] Sit to Stand: Mod assist;+2 safety/equipment;+2 physical assistance Stand pivot transfers: Max assist;+2 physical assistance;+2 safety/equipment       General transfer comment: Difficulty following commands to side step over to bedside chair.   Ambulation/Gait     Assistive device: 2 person hand held assist Gait Pattern/deviations: Wide base of support     General Gait Details: She essentially stays rigid once in standing, unable to process to move either leg. Had to do quick pivot into chair this am   Stairs             Wheelchair Mobility    Modified Rankin (Stroke Patients Only)       Balance Overall balance assessment: Needs assistance Sitting-balance support: Feet supported Sitting balance-Leahy Scale: Fair       Standing balance-Leahy Scale: Poor                              Cognition Arousal/Alertness: Awake/alert Behavior During Therapy: WFL for tasks assessed/performed Overall Cognitive Status: History of cognitive impairments - at baseline  General Comments: Hx of dementia at baseline. Pt A&O x 0 this date. Pt able to state her first name, unable to recall last name and DOB.       Exercises General Exercises - Lower Extremity Ankle Circles/Pumps: AROM;AAROM;Seated Hip ABduction/ADduction: AROM;AAROM;Seated Straight Leg Raises: AROM;AAROM;Seated Other  Exercises Other Exercises: Reviewed use of IS and she was able to return demo at 750 x 5. Will continue to encourage use of same.-- will continue to encourage use to improve overall lung capacity and proprer breathing.    General Comments General comments (skin integrity, edema, etc.): Pt on room air with no signs/symptoms or complaints of SOB. Pt reports dizziness throughout with BP sitting EOB: 125/75mmHg and BP sitting in chair: 122/37mmHg.       Pertinent Vitals/Pain Pain Assessment: No/denies pain    Home Living                      Prior Function            PT Goals (current goals can now be found in the care plan section) Acute Rehab PT Goals Patient Stated Goal: Pt agreeable to sitting up in chair- could not understand setting a goal beyond this PT Goal Formulation: With patient Potential to Achieve Goals: Fair    Frequency    Min 2X/week      PT Plan Current plan remains appropriate    Co-evaluation PT/OT/SLP Co-Evaluation/Treatment: Yes Reason for Co-Treatment: For patient/therapist safety;To address functional/ADL transfers PT goals addressed during session: Mobility/safety with mobility;Strengthening/ROM OT goals addressed during session: ADL's and self-care      AM-PAC PT "6 Clicks" Mobility   Outcome Measure  Help needed turning from your back to your side while in a flat bed without using bedrails?: A Lot Help needed moving from lying on your back to sitting on the side of a flat bed without using bedrails?: A Lot Help needed moving to and from a bed to a chair (including a wheelchair)?: A Lot Help needed standing up from a chair using your arms (e.g., wheelchair or bedside chair)?: A Little Help needed to walk in hospital room?: Total Help needed climbing 3-5 steps with a railing? : Total 6 Click Score: 11    End of Session Equipment Utilized During Treatment: Gait belt Activity Tolerance: Patient tolerated treatment well;Other  (comment)(HIndered by cognition) Patient left: in chair;with call bell/phone within reach;with bed alarm set Nurse Communication: Mobility status PT Visit Diagnosis: Muscle weakness (generalized) (M62.81)     Time: 1000-1040 PT Time Calculation (min) (ACUTE ONLY): 40 min  Charges:  $Therapeutic Exercise: 8-22 mins $Therapeutic Activity: 8-22 mins                   Rollen Sox, PT # [phone] CGV cell  Casandra Doffing 04/21/2019, 1:03 PM

## 2019-04-21 NOTE — Progress Notes (Signed)
Occupational Therapy Treatment Patient Details Name: Kara Ortega MRN: [id number] DOB: Jan 27, 1930 Today's Date: 04/21/2019    History of present illness 86 year old female with past medical history of dementia, Alzheimer type, chronic kidney disease stage IV, chronic obstructive pulmonary disease, diabetes mellitus type 2, hypertension, previous stroke with left-sided hemiparesis, who presented with acute onset of shortness of breath from nursing facility.  Was in ED 3 days prior to admission diagnosed with COVID-19, he presents with shortness of breath, was hypoxic on presentation, imaging with COVID-19 for pneumonia, she was transferred to [facility] for further management.  Usually with significant hospital delirium, fused, trying to get out of bed to chair, required sitter for a brief period of time, as well she did require some as needed antipsychotic and low-dose Seroquel, mentation has improved, patient oxygen requirement has been increasing steadily.   OT comments  Pt making progress in therapy, demonstrating improved independence in self-care and functional transfer tasks this date. Pt tolerated sitting edge of bed ~15 min with supervision, noting 0 instances of loss of balance. Pt able to start brief over feet with min assist while seated, requiring max assist to pull up over hips in standing. Pt tolerated standing ~2 min with max assist x 2, able to side step over to the bedside chair this date. Pt engaged in grooming tasks while seated in bedside chair requiring supervision. Pt required increased time and cues to complete all tasks. Pt continues to require verbal and tactile cues to initiate tasks. Pt on room air with no reports of shortness of breath throughout. Educated pt on fall prevention strategies in room with poor to fair understanding. RN present at end of session and updated. OT will continue to follow acutely.    Follow Up Recommendations  SNF    Equipment Recommendations  Other  (comment)(TBD at next venue of care.)    Recommendations for Other Services      Precautions / Restrictions Precautions Precautions: Fall Restrictions Weight Bearing Restrictions: No       Mobility Bed Mobility Overal bed mobility: Needs Assistance Bed Mobility: Supine to Sit     Supine to sit: Mod assist;+2 for physical assistance;HOB elevated     General bed mobility comments: Cues to initiate task. Difficulty following one step instructions.   Transfers Overall transfer level: Needs assistance Equipment used: 2 person hand held assist Transfers: Sit to/from [facility] Sit to Stand: Mod assist;+2 safety/equipment;+2 physical assistance Stand pivot transfers: Max assist;+2 physical assistance;+2 safety/equipment       General transfer comment: Difficulty following commands to side step over to bedside chair.     Balance Overall balance assessment: Needs assistance Sitting-balance support: Feet supported Sitting balance-Leahy Scale: Fair       Standing balance-Leahy Scale: Poor                             ADL either performed or assessed with clinical judgement   ADL Overall ADL's : Needs assistance/impaired     Grooming: Supervision/safety;Set up;Sitting;Wash/dry hands;Wash/dry face               Lower Body Dressing: Moderate assistance;Sitting/lateral leans;Sit to/from stand Lower Body Dressing Details (indicate cue type and reason): Pt able to don brief over feet with min assist. Pt required max assist to pull up over hips in standing.             Functional mobility during ADLs: Maximal assistance;+2 for physical assistance;+2  for safety/equipment General ADL Comments: Pt able to stand pivot transfer to bedside chair with hand held max assist x 2. Difficulty sequencing instructions to side step over to chair.      Vision       Perception     Praxis      Cognition Arousal/Alertness: Awake/alert Behavior  During Therapy: WFL for tasks assessed/performed Overall Cognitive Status: History of cognitive impairments - at baseline                                 General Comments: Hx of dementia at baseline. Pt A&O x 0 this date. Pt able to state her first name, unable to recall last name and DOB.         Exercises     Shoulder Instructions       General Comments Pt on room air with no signs/symptoms or complaints of SOB. Pt reports dizziness throughout with BP sitting EOB: 125/52mmHg and BP sitting in chair: 122/58mmHg.     Pertinent Vitals/ Pain       Pain Assessment: No/denies pain  Home Living                                          Prior Functioning/Environment              Frequency           Progress Toward Goals  OT Goals(current goals can now be found in the care plan section)  Progress towards OT goals: Progressing toward goals  ADL Goals Pt Will Perform Eating: with set-up;sitting Pt Will Perform Grooming: with set-up;sitting Pt Will Perform Lower Body Bathing: with min assist;sitting/lateral leans;sit to/from stand Pt Will Perform Lower Body Dressing: with min assist;sitting/lateral leans;sit to/from stand Pt Will Transfer to Toilet: with min assist;bedside commode Pt Will Perform Toileting - Clothing Manipulation and hygiene: with min assist;sit to/from stand;sitting/lateral leans Additional ADL Goal #1: Pt to tolerate standing up to 5 min with min assist and SpO2 maintaining in 90s, in preparation for ADLs.  Plan Discharge plan remains appropriate    Co-evaluation    PT/OT/SLP Co-Evaluation/Treatment: Yes Reason for Co-Treatment: Necessary to address cognition/behavior during functional activity;Complexity of the patient's impairments (multi-system involvement);For patient/therapist safety;To address functional/ADL transfers   OT goals addressed during session: ADL's and self-care      AM-PAC OT "6 Clicks" Daily  Activity     Outcome Measure   Help from another person eating meals?: A Little Help from another person taking care of personal grooming?: A Little Help from another person toileting, which includes using toliet, bedpan, or urinal?: A Lot Help from another person bathing (including washing, rinsing, drying)?: A Lot Help from another person to put on and taking off regular upper body clothing?: A Lot Help from another person to put on and taking off regular lower body clothing?: A Lot 6 Click Score: 14    End of Session Equipment Utilized During Treatment: Gait belt  OT Visit Diagnosis: Unsteadiness on feet (R26.81);Muscle weakness (generalized) (M62.81)   Activity Tolerance Patient limited by fatigue(Limited due to cognitive deficits)   Patient Left in chair;with call bell/phone within reach;with chair alarm set;with nursing/sitter in room   Nurse Communication Mobility status        Time: [id number] OT Time Calculation (min): 39 min  Charges: OT General Charges $OT Visit: 1 Visit OT Treatments $Self Care/Home Management : 8-22 mins $Therapeutic Activity: 8-22 mins  Mauri Brooklyn OTR/L [phone]    Mauri Brooklyn 04/21/2019, 10:55 AM

## 2019-04-24 DIAGNOSIS — G309 Alzheimer's disease, unspecified: Secondary | ICD-10-CM | POA: Diagnosis not present

## 2019-04-24 DIAGNOSIS — J449 Chronic obstructive pulmonary disease, unspecified: Secondary | ICD-10-CM | POA: Diagnosis not present

## 2019-04-24 DIAGNOSIS — N184 Chronic kidney disease, stage 4 (severe): Secondary | ICD-10-CM | POA: Diagnosis not present

## 2019-04-24 DIAGNOSIS — U071 COVID-19: Secondary | ICD-10-CM | POA: Diagnosis not present

## 2019-05-05 DIAGNOSIS — Z7401 Bed confinement status: Secondary | ICD-10-CM | POA: Diagnosis not present

## 2019-05-05 DIAGNOSIS — E785 Hyperlipidemia, unspecified: Secondary | ICD-10-CM | POA: Diagnosis not present

## 2019-05-05 DIAGNOSIS — R0902 Hypoxemia: Secondary | ICD-10-CM | POA: Diagnosis not present

## 2019-05-05 DIAGNOSIS — U071 COVID-19: Secondary | ICD-10-CM | POA: Diagnosis not present

## 2019-05-05 DIAGNOSIS — E1165 Type 2 diabetes mellitus with hyperglycemia: Secondary | ICD-10-CM | POA: Diagnosis not present

## 2019-05-05 DIAGNOSIS — E119 Type 2 diabetes mellitus without complications: Secondary | ICD-10-CM | POA: Diagnosis not present

## 2019-05-05 DIAGNOSIS — R410 Disorientation, unspecified: Secondary | ICD-10-CM | POA: Diagnosis not present

## 2019-05-05 DIAGNOSIS — G309 Alzheimer's disease, unspecified: Secondary | ICD-10-CM | POA: Diagnosis not present

## 2019-05-05 DIAGNOSIS — J1282 Pneumonia due to coronavirus disease 2019: Secondary | ICD-10-CM | POA: Diagnosis not present

## 2019-05-05 DIAGNOSIS — R531 Weakness: Secondary | ICD-10-CM | POA: Diagnosis not present

## 2019-05-05 DIAGNOSIS — G459 Transient cerebral ischemic attack, unspecified: Secondary | ICD-10-CM | POA: Diagnosis not present

## 2019-05-05 DIAGNOSIS — F0280 Dementia in other diseases classified elsewhere without behavioral disturbance: Secondary | ICD-10-CM | POA: Diagnosis not present

## 2019-05-05 DIAGNOSIS — R29898 Other symptoms and signs involving the musculoskeletal system: Secondary | ICD-10-CM | POA: Diagnosis not present

## 2019-05-05 DIAGNOSIS — Z794 Long term (current) use of insulin: Secondary | ICD-10-CM | POA: Diagnosis not present

## 2019-05-23 DIAGNOSIS — N390 Urinary tract infection, site not specified: Secondary | ICD-10-CM | POA: Diagnosis not present

## 2019-05-23 DIAGNOSIS — I10 Essential (primary) hypertension: Secondary | ICD-10-CM | POA: Diagnosis not present

## 2019-05-23 DIAGNOSIS — R319 Hematuria, unspecified: Secondary | ICD-10-CM | POA: Diagnosis not present

## 2019-05-23 DIAGNOSIS — R4182 Altered mental status, unspecified: Secondary | ICD-10-CM | POA: Diagnosis not present

## 2019-05-23 DIAGNOSIS — U071 COVID-19: Secondary | ICD-10-CM | POA: Diagnosis not present

## 2019-05-23 DIAGNOSIS — F0280 Dementia in other diseases classified elsewhere without behavioral disturbance: Secondary | ICD-10-CM | POA: Diagnosis not present

## 2019-05-23 DIAGNOSIS — E118 Type 2 diabetes mellitus with unspecified complications: Secondary | ICD-10-CM | POA: Diagnosis not present

## 2019-05-26 DIAGNOSIS — J9601 Acute respiratory failure with hypoxia: Secondary | ICD-10-CM | POA: Diagnosis not present

## 2019-05-26 DIAGNOSIS — N184 Chronic kidney disease, stage 4 (severe): Secondary | ICD-10-CM | POA: Diagnosis not present

## 2019-05-26 DIAGNOSIS — U071 COVID-19: Secondary | ICD-10-CM | POA: Diagnosis not present

## 2019-05-26 DIAGNOSIS — J449 Chronic obstructive pulmonary disease, unspecified: Secondary | ICD-10-CM | POA: Diagnosis not present

## 2019-06-06 ENCOUNTER — Inpatient Hospital Stay (HOSPITAL_COMMUNITY)
Admission: EM | Admit: 2019-06-06 | Discharge: 2019-06-14 | DRG: 871 | Disposition: A | Discharge status: Hospice | Payer: Medicare Other | Source: Skilled Nursing Facility | Attending: Internal Medicine | Admitting: Internal Medicine

## 2019-06-06 ENCOUNTER — Other Ambulatory Visit: Payer: Self-pay

## 2019-06-06 ENCOUNTER — Encounter (HOSPITAL_COMMUNITY): Payer: Self-pay | Admitting: Emergency Medicine

## 2019-06-06 ENCOUNTER — Emergency Department (HOSPITAL_COMMUNITY): Payer: Medicare Other

## 2019-06-06 DIAGNOSIS — Z66 Do not resuscitate: Secondary | ICD-10-CM | POA: Diagnosis not present

## 2019-06-06 DIAGNOSIS — N179 Acute kidney failure, unspecified: Secondary | ICD-10-CM | POA: Diagnosis present

## 2019-06-06 DIAGNOSIS — N390 Urinary tract infection, site not specified: Secondary | ICD-10-CM | POA: Diagnosis present

## 2019-06-06 DIAGNOSIS — Z79890 Hormone replacement therapy (postmenopausal): Secondary | ICD-10-CM

## 2019-06-06 DIAGNOSIS — I69354 Hemiplegia and hemiparesis following cerebral infarction affecting left non-dominant side: Secondary | ICD-10-CM

## 2019-06-06 DIAGNOSIS — N183 Chronic kidney disease, stage 3 unspecified: Secondary | ICD-10-CM | POA: Diagnosis present

## 2019-06-06 DIAGNOSIS — I130 Hypertensive heart and chronic kidney disease with heart failure and stage 1 through stage 4 chronic kidney disease, or unspecified chronic kidney disease: Secondary | ICD-10-CM | POA: Diagnosis present

## 2019-06-06 DIAGNOSIS — M109 Gout, unspecified: Secondary | ICD-10-CM | POA: Diagnosis present

## 2019-06-06 DIAGNOSIS — R627 Adult failure to thrive: Secondary | ICD-10-CM | POA: Diagnosis not present

## 2019-06-06 DIAGNOSIS — G4733 Obstructive sleep apnea (adult) (pediatric): Secondary | ICD-10-CM | POA: Diagnosis present

## 2019-06-06 DIAGNOSIS — E860 Dehydration: Secondary | ICD-10-CM | POA: Diagnosis present

## 2019-06-06 DIAGNOSIS — Z515 Encounter for palliative care: Secondary | ICD-10-CM | POA: Diagnosis not present

## 2019-06-06 DIAGNOSIS — E876 Hypokalemia: Secondary | ICD-10-CM | POA: Diagnosis not present

## 2019-06-06 DIAGNOSIS — Z87891 Personal history of nicotine dependence: Secondary | ICD-10-CM

## 2019-06-06 DIAGNOSIS — L8990 Pressure ulcer of unspecified site, unspecified stage: Secondary | ICD-10-CM | POA: Insufficient documentation

## 2019-06-06 DIAGNOSIS — J449 Chronic obstructive pulmonary disease, unspecified: Secondary | ICD-10-CM | POA: Diagnosis present

## 2019-06-06 DIAGNOSIS — I5032 Chronic diastolic (congestive) heart failure: Secondary | ICD-10-CM | POA: Diagnosis present

## 2019-06-06 DIAGNOSIS — K219 Gastro-esophageal reflux disease without esophagitis: Secondary | ICD-10-CM | POA: Diagnosis present

## 2019-06-06 DIAGNOSIS — F329 Major depressive disorder, single episode, unspecified: Secondary | ICD-10-CM | POA: Diagnosis present

## 2019-06-06 DIAGNOSIS — Z7401 Bed confinement status: Secondary | ICD-10-CM

## 2019-06-06 DIAGNOSIS — Z79899 Other long term (current) drug therapy: Secondary | ICD-10-CM

## 2019-06-06 DIAGNOSIS — F0280 Dementia in other diseases classified elsewhere without behavioral disturbance: Secondary | ICD-10-CM | POA: Diagnosis present

## 2019-06-06 DIAGNOSIS — Z8249 Family history of ischemic heart disease and other diseases of the circulatory system: Secondary | ICD-10-CM

## 2019-06-06 DIAGNOSIS — R296 Repeated falls: Secondary | ICD-10-CM | POA: Diagnosis present

## 2019-06-06 DIAGNOSIS — E872 Acidosis, unspecified: Secondary | ICD-10-CM

## 2019-06-06 DIAGNOSIS — E785 Hyperlipidemia, unspecified: Secondary | ICD-10-CM | POA: Diagnosis present

## 2019-06-06 DIAGNOSIS — I330 Acute and subacute infective endocarditis: Secondary | ICD-10-CM | POA: Diagnosis present

## 2019-06-06 DIAGNOSIS — R531 Weakness: Secondary | ICD-10-CM

## 2019-06-06 DIAGNOSIS — Z993 Dependence on wheelchair: Secondary | ICD-10-CM

## 2019-06-06 DIAGNOSIS — I340 Nonrheumatic mitral (valve) insufficiency: Secondary | ICD-10-CM | POA: Diagnosis not present

## 2019-06-06 DIAGNOSIS — I4821 Permanent atrial fibrillation: Secondary | ICD-10-CM | POA: Diagnosis present

## 2019-06-06 DIAGNOSIS — E11649 Type 2 diabetes mellitus with hypoglycemia without coma: Secondary | ICD-10-CM | POA: Diagnosis not present

## 2019-06-06 DIAGNOSIS — I083 Combined rheumatic disorders of mitral, aortic and tricuspid valves: Secondary | ICD-10-CM | POA: Diagnosis present

## 2019-06-06 DIAGNOSIS — B952 Enterococcus as the cause of diseases classified elsewhere: Secondary | ICD-10-CM

## 2019-06-06 DIAGNOSIS — Z20822 Contact with and (suspected) exposure to covid-19: Secondary | ICD-10-CM | POA: Diagnosis not present

## 2019-06-06 DIAGNOSIS — Z7189 Other specified counseling: Secondary | ICD-10-CM

## 2019-06-06 DIAGNOSIS — Z820 Family history of epilepsy and other diseases of the nervous system: Secondary | ICD-10-CM

## 2019-06-06 DIAGNOSIS — R4182 Altered mental status, unspecified: Secondary | ICD-10-CM | POA: Diagnosis not present

## 2019-06-06 DIAGNOSIS — R410 Disorientation, unspecified: Secondary | ICD-10-CM | POA: Diagnosis not present

## 2019-06-06 DIAGNOSIS — G309 Alzheimer's disease, unspecified: Secondary | ICD-10-CM | POA: Diagnosis present

## 2019-06-06 DIAGNOSIS — I350 Nonrheumatic aortic (valve) stenosis: Secondary | ICD-10-CM | POA: Diagnosis not present

## 2019-06-06 DIAGNOSIS — G92 Toxic encephalopathy: Secondary | ICD-10-CM | POA: Diagnosis present

## 2019-06-06 DIAGNOSIS — N3000 Acute cystitis without hematuria: Secondary | ICD-10-CM | POA: Diagnosis not present

## 2019-06-06 DIAGNOSIS — Z794 Long term (current) use of insulin: Secondary | ICD-10-CM

## 2019-06-06 DIAGNOSIS — E1140 Type 2 diabetes mellitus with diabetic neuropathy, unspecified: Secondary | ICD-10-CM | POA: Diagnosis present

## 2019-06-06 DIAGNOSIS — A4181 Sepsis due to Enterococcus: Secondary | ICD-10-CM | POA: Diagnosis not present

## 2019-06-06 DIAGNOSIS — Z887 Allergy status to serum and vaccine status: Secondary | ICD-10-CM

## 2019-06-06 DIAGNOSIS — G9341 Metabolic encephalopathy: Secondary | ICD-10-CM | POA: Diagnosis not present

## 2019-06-06 DIAGNOSIS — Z8701 Personal history of pneumonia (recurrent): Secondary | ICD-10-CM

## 2019-06-06 DIAGNOSIS — Z7901 Long term (current) use of anticoagulants: Secondary | ICD-10-CM

## 2019-06-06 DIAGNOSIS — E1122 Type 2 diabetes mellitus with diabetic chronic kidney disease: Secondary | ICD-10-CM | POA: Diagnosis present

## 2019-06-06 DIAGNOSIS — G301 Alzheimer's disease with late onset: Secondary | ICD-10-CM | POA: Diagnosis not present

## 2019-06-06 DIAGNOSIS — R7881 Bacteremia: Secondary | ICD-10-CM

## 2019-06-06 DIAGNOSIS — Z888 Allergy status to other drugs, medicaments and biological substances status: Secondary | ICD-10-CM

## 2019-06-06 DIAGNOSIS — R0902 Hypoxemia: Secondary | ICD-10-CM | POA: Diagnosis not present

## 2019-06-06 DIAGNOSIS — Z8616 Personal history of COVID-19: Secondary | ICD-10-CM

## 2019-06-06 DIAGNOSIS — A419 Sepsis, unspecified organism: Secondary | ICD-10-CM

## 2019-06-06 DIAGNOSIS — R404 Transient alteration of awareness: Secondary | ICD-10-CM | POA: Diagnosis not present

## 2019-06-06 DIAGNOSIS — G300 Alzheimer's disease with early onset: Secondary | ICD-10-CM | POA: Diagnosis not present

## 2019-06-06 DIAGNOSIS — I351 Nonrheumatic aortic (valve) insufficiency: Secondary | ICD-10-CM | POA: Diagnosis not present

## 2019-06-06 LAB — URINALYSIS, ROUTINE W REFLEX MICROSCOPIC
Bilirubin Urine: NEGATIVE
Glucose, UA: NEGATIVE mg/dL
Hgb urine dipstick: NEGATIVE
Ketones, ur: NEGATIVE mg/dL
Nitrite: NEGATIVE
Protein, ur: 30 mg/dL — AB
Specific Gravity, Urine: 1.011 (ref 1.005–1.030)
pH: 5 (ref 5.0–8.0)

## 2019-06-06 LAB — CBC WITH DIFFERENTIAL/PLATELET
Abs Immature Granulocytes: 0.1 10*3/uL — ABNORMAL HIGH (ref 0.00–0.07)
Basophils Absolute: 0 10*3/uL (ref 0.0–0.1)
Basophils Relative: 0 %
Eosinophils Absolute: 0.1 10*3/uL (ref 0.0–0.5)
Eosinophils Relative: 1 %
HCT: 47.1 % — ABNORMAL HIGH (ref 36.0–46.0)
Hemoglobin: 15.3 g/dL — ABNORMAL HIGH (ref 12.0–15.0)
Immature Granulocytes: 1 %
Lymphocytes Relative: 16 %
Lymphs Abs: 1.5 10*3/uL (ref 0.7–4.0)
MCH: 29.4 pg (ref 26.0–34.0)
MCHC: 32.5 g/dL (ref 30.0–36.0)
MCV: 90.4 fL (ref 80.0–100.0)
Monocytes Absolute: 0.9 10*3/uL (ref 0.1–1.0)
Monocytes Relative: 10 %
Neutro Abs: 6.5 10*3/uL (ref 1.7–7.7)
Neutrophils Relative %: 72 %
Platelets: 242 10*3/uL (ref 150–400)
RBC: 5.21 MIL/uL — ABNORMAL HIGH (ref 3.87–5.11)
RDW: 18.7 % — ABNORMAL HIGH (ref 11.5–15.5)
WBC: 9.2 10*3/uL (ref 4.0–10.5)
nRBC: 0 % (ref 0.0–0.2)

## 2019-06-06 LAB — LACTIC ACID, PLASMA
Lactic Acid, Venous: 3.4 mmol/L (ref 0.5–1.9)
Lactic Acid, Venous: 2.8 mmol/L (ref 0.5–1.9)

## 2019-06-06 LAB — COMPREHENSIVE METABOLIC PANEL
ALT: 21 U/L (ref 0–44)
AST: 26 U/L (ref 15–41)
Albumin: 3.2 g/dL — ABNORMAL LOW (ref 3.5–5.0)
Alkaline Phosphatase: 63 U/L (ref 38–126)
Anion gap: 14 (ref 5–15)
BUN: 21 mg/dL (ref 8–23)
CO2: 23 mmol/L (ref 22–32)
Calcium: 9.2 mg/dL (ref 8.9–10.3)
Chloride: 96 mmol/L — ABNORMAL LOW (ref 98–111)
Creatinine, Ser: 1.32 mg/dL — ABNORMAL HIGH (ref 0.44–1.00)
GFR calc Af Amer: 42 mL/min — ABNORMAL LOW (ref >60)
GFR calc non Af Amer: 36 mL/min — ABNORMAL LOW (ref >60)
Glucose, Bld: 203 mg/dL — ABNORMAL HIGH (ref 70–99)
Potassium: 4.2 mmol/L (ref 3.5–5.1)
Sodium: 133 mmol/L — ABNORMAL LOW (ref 135–145)
Total Bilirubin: 0.9 mg/dL (ref 0.3–1.2)
Total Protein: 6 g/dL — ABNORMAL LOW (ref 6.5–8.1)

## 2019-06-06 LAB — HEMOGLOBIN A1C
Hgb A1c MFr Bld: 8.1 % — ABNORMAL HIGH (ref 4.8–5.6)
Mean Plasma Glucose: 185.77 mg/dL

## 2019-06-06 LAB — GLUCOSE, CAPILLARY
Glucose-Capillary: 100 mg/dL — ABNORMAL HIGH (ref 70–99)
Glucose-Capillary: 78 mg/dL (ref 70–99)

## 2019-06-06 LAB — SARS CORONAVIRUS 2 (TAT 6-24 HRS): SARS Coronavirus 2: NEGATIVE

## 2019-06-06 LAB — CBG MONITORING, ED: Glucose-Capillary: 122 mg/dL — ABNORMAL HIGH (ref 70–99)

## 2019-06-06 MED ORDER — SODIUM CHLORIDE 0.9 % IV SOLN
1.0000 g | INTRAVENOUS | Status: DC
  Administered 2019-06-06: 1 g via INTRAVENOUS
  Filled 2019-06-06 (×2): qty 10

## 2019-06-06 MED ORDER — SODIUM CHLORIDE 0.9 % IV BOLUS (SEPSIS)
1000.0000 mL | Freq: Once | INTRAVENOUS | Status: AC
  Administered 2019-06-06: 1000 mL via INTRAVENOUS

## 2019-06-06 MED ORDER — ALPRAZOLAM 0.25 MG PO TABS
0.2500 mg | ORAL_TABLET | Freq: Every evening | ORAL | Status: DC | PRN
  Administered 2019-06-06 – 2019-06-13 (×4): 0.25 mg via ORAL
  Filled 2019-06-06 (×4): qty 1

## 2019-06-06 MED ORDER — ALLOPURINOL 100 MG PO TABS
200.0000 mg | ORAL_TABLET | Freq: Every day | ORAL | Status: DC
  Administered 2019-06-06 – 2019-06-14 (×7): 200 mg via ORAL
  Filled 2019-06-06 (×9): qty 2

## 2019-06-06 MED ORDER — SODIUM CHLORIDE 0.9 % IV SOLN: 1.0000 g | Freq: Once | INTRAVENOUS | Status: DC

## 2019-06-06 MED ORDER — INSULIN ASPART 100 UNIT/ML ~~LOC~~ SOLN
0.0000 [IU] | Freq: Three times a day (TID) | SUBCUTANEOUS | Status: DC
  Administered 2019-06-06 – 2019-06-07 (×2): 1 [IU] via SUBCUTANEOUS
  Administered 2019-06-07: 2 [IU] via SUBCUTANEOUS
  Administered 2019-06-08 (×2): 1 [IU] via SUBCUTANEOUS
  Administered 2019-06-09 – 2019-06-11 (×7): 2 [IU] via SUBCUTANEOUS
  Administered 2019-06-11 – 2019-06-12 (×4): 1 [IU] via SUBCUTANEOUS
  Administered 2019-06-13 – 2019-06-14 (×4): 2 [IU] via SUBCUTANEOUS

## 2019-06-06 MED ORDER — ACETAMINOPHEN 325 MG PO TABS: 650.0000 mg | ORAL_TABLET | Freq: Four times a day (QID) | ORAL | Status: DC | PRN

## 2019-06-06 MED ORDER — SODIUM CHLORIDE 0.9% FLUSH
3.0000 mL | Freq: Once | INTRAVENOUS | Status: AC
  Administered 2019-06-06: 3 mL via INTRAVENOUS

## 2019-06-06 MED ORDER — TOPIRAMATE 25 MG PO TABS
25.0000 mg | ORAL_TABLET | Freq: Every day | ORAL | Status: DC
  Administered 2019-06-06 – 2019-06-13 (×6): 25 mg via ORAL
  Filled 2019-06-06 (×9): qty 1

## 2019-06-06 MED ORDER — SODIUM CHLORIDE 0.9 % IV SOLN: INTRAVENOUS | Status: DC

## 2019-06-06 MED ORDER — APIXABAN 5 MG PO TABS
5.0000 mg | ORAL_TABLET | Freq: Two times a day (BID) | ORAL | Status: DC
  Administered 2019-06-06 – 2019-06-14 (×12): 5 mg via ORAL
  Filled 2019-06-06 (×15): qty 1

## 2019-06-06 MED ORDER — TRAMADOL HCL 50 MG PO TABS
25.0000 mg | ORAL_TABLET | Freq: Three times a day (TID) | ORAL | Status: DC | PRN
  Administered 2019-06-08: 25 mg via ORAL
  Filled 2019-06-06: qty 1

## 2019-06-06 MED ORDER — ATORVASTATIN CALCIUM 10 MG PO TABS
10.0000 mg | ORAL_TABLET | Freq: Every day | ORAL | Status: DC
  Administered 2019-06-06 – 2019-06-13 (×6): 10 mg via ORAL
  Filled 2019-06-06 (×7): qty 1

## 2019-06-06 MED ORDER — INSULIN ASPART 100 UNIT/ML ~~LOC~~ SOLN: 0.0000 [IU] | Freq: Every day | SUBCUTANEOUS | Status: DC

## 2019-06-06 NOTE — H&P (Signed)
History and Physical    Kara Ortega VQX:[id number] DOB: 1929-11-17 DOA: 06/06/2019  PCP: Carol Ada, MD   Patient coming from: [facility] memory care unit    Chief Complaint: Altered mental status, generalized weakness  HPI: Kara Ortega is a 86 y.o. female with medical history significant of permanent A. fib, CKD stage II,  advanced dementia, diabetes type 2 on insulin, hypertension, history of nonhemorrhagic stroke with left-sided hemiparesis , Covid infection in January who presented from memory care nursing facility with complaints of hallucinations, general weakness, decreased responsiveness, dark/cloudy urine. Patient did not contribute to any history, all information was taken from the son on phone and medical records. As per the report, she was less responsive than her baseline mental status.  She has history of advanced dementia and confused at baseline.  The staff at nursing facility also noticed dark and cloudy urine.  There is no report of fever or chills. She was admitted  for Covid-19 viral pneumonia and was discharged on 04/21/2019. She used to ambulate with the help of walker but after her Covid infection, she is mostly wheelchair-bound . Patient seen and examined at the bedside in the emergency department.  Currently she is hemodynamically stable.  She looked comfortable during my evaluation but was confused and was talking inappropriately.  ED Course: On presentation, her blood pressure was soft.  Lactic acid was elevated.  UA was impressive for urinary tract infection.  She was started on ceftriaxone.  Also found to have AKI blood culture, urine culture sent.  Started on IV fluids.  Review of Systems: As per HPI otherwise 10 point review of systems negative.    Past Medical History:  Diagnosis Date  . A-fib ([place])   . Abnormal heart rhythms   . Alzheimer disease ([place])   . Anginal pain ([place])   . Back pain   . CHF (congestive heart failure) ([place])   .  Chronic kidney disease    STAGE 3   . COPD (chronic obstructive pulmonary disease) ([place])   . Depression   . Diabetes ([place])    insulin dependent  . Hypertension   . Leg pain   . Memory loss   . Neuropathy   . Shortness of breath   . Sleep apnea    uses bipap X12 years.  . Stroke ([place])    LEFT SIDE RESIDUAL    Past Surgical History:  Procedure Laterality Date  . BREAST LUMPECTOMY    . BREAST SURGERY     X4  . COLON SURGERY     removed 14 inches of colon  . HERNIA REPAIR       reports that she quit smoking about 51 years ago. Her smoking use included cigarettes. She has a 27.00 pack-year smoking history. She has never used smokeless tobacco. She reports that she does not drink alcohol or use drugs.  Allergies  Allergen Reactions  . Avandia [Rosiglitazone] Other (See Comments)    edema  . Benazepril Other (See Comments)    unknown  . Dilaudid [Hydromorphone Hcl] Other (See Comments)    "does not tolerate strong pain medications" per pt  . Fosamax [Alendronate Sodium] Nausea And Vomiting  . Talwin [Pentazocine] Nausea And Vomiting  . Tetanus Toxoids Other (See Comments)    "allergic to something in the tetanus injection" per patient  . Zyrtec [Cetirizine] Other (See Comments)    unknown    Family History  Problem Relation Age of Onset  . Dementia Maternal Grandmother   .  Heart disease Mother   . Heart disease Sister   . Alzheimer's disease Sister   . Dementia Maternal Aunt   . Mental illness Other      Prior to Admission medications   Medication Sig Start Date End Date Taking? Authorizing Provider  acetaminophen (TYLENOL) 325 MG tablet Take 650 mg by mouth every 6 (six) hours as needed.    [provider]  allopurinol (ZYLOPRIM) 100 MG tablet Take 200 mg by mouth daily.     [provider]  ALPRAZolam Duanne Moron) 0.25 MG tablet Take 1 tablet (0.25 mg total) by mouth every 12 (twelve) hours as needed for anxiety or sleep. 04/21/19   Caren Griffins, MD  apixaban (ELIQUIS) 5 MG TABS tablet Take 1 tablet (5 mg total) by mouth 2 (two) times daily. 02/19/15   Isaiah Serge, NP  Ascorbic Acid (VITAMIN C) 1000 MG tablet Take 1,000 mg by mouth daily.    [provider]  atorvastatin (LIPITOR) 10 MG tablet Take 10 mg by mouth at bedtime.     [provider]  B-D UF III MINI PEN NEEDLES 31G X 5 MM MISC U UTD TO INJECT INSULIN 02/25/15   [provider]  Cholecalciferol (HM VITAMIN D3) 4000 UNITS CAPS Take 1,000 Units by mouth daily.     [provider]  citalopram (CELEXA) 20 MG tablet Take 20 mg by mouth daily.    [provider]  clotrimazole (LOTRIMIN) 1 % cream Apply 1 application topically 2 (two) times daily. To toe    [provider]  diclofenac sodium (VOLTAREN) 1 % GEL Apply 4 g topically 4 (four) times daily as needed (For pain.).     [provider]  docusate sodium (COLACE) 100 MG capsule Take 100 mg by mouth daily as needed for mild constipation.    [provider]  furosemide (LASIX) 20 MG tablet Take 20 mg by mouth daily.     [provider]  guaiFENesin (MUCINEX) 600 MG 12 hr tablet Take by mouth every 12 (twelve) hours as needed for cough or to loosen phlegm.    [provider]  hydrocortisone (ANUSOL-HC) 25 MG suppository Place 25 mg rectally 2 (two) times daily as needed for hemorrhoids.     [provider]  insulin lispro (HUMALOG) 100 UNIT/ML injection Inject 16 Units into the skin once.    [provider]  isosorbide mononitrate (IMDUR) 60 MG 24 hr tablet Take 1 tablet (60 mg total) by mouth daily. 04/27/17   Isaiah Serge, NP  ketoconazole (NIZORAL) 2 % cream Apply 1 application topically as needed. For rash 08/12/18   [provider]  Liraglutide (VICTOZA) 18 MG/3ML SOPN Inject 1.2 mg into the skin daily.     [provider]  loperamide (IMODIUM A-D) 2 MG tablet Take 2 mg by mouth 4 (four) times daily  as needed for diarrhea or loose stools.    [provider]  Melatonin 5 MG TABS Take 10 mg by mouth at bedtime.    [provider]  methocarbamol (ROBAXIN) 500 MG tablet Take 500 mg by mouth every 6 (six) hours as needed (back pain).     [provider]  nitroGLYCERIN (NITROSTAT) 0.4 MG SL tablet Place 1 tablet (0.4 mg total) under the tongue every 5 (five) minutes as needed for chest pain. 05/19/14   Jerline Pain, MD  nystatin (MYCOSTATIN/NYSTOP) powder Apply 1 application topically 2 (two) times daily. Under breast  [provider]  Polyethyl Glycol-Propyl Glycol (SYSTANE) 0.4-0.3 % SOLN Place 1 drop into both eyes 2 (two) times a day.    [provider]  pregabalin (LYRICA) 100 MG capsule Take 1 capsule (100 mg total) by mouth 2 (two) times daily. Patient taking differently: Take 100-200 mg by mouth 2 (two) times daily. Give 1 capsule (100mg ) in th am, then 2 capsules (200mg ) in the evening 07/25/16   Pollina, Gwenyth Allegra, MD  psyllium (METAMUCIL SMOOTH TEXTURE) 28 % packet Take 1 packet by mouth daily as needed (constipation).    [provider]  Rivastigmine 13.3 MG/24HR PT24 Place 1 patch (13.3 mg total) onto the skin daily. 04/27/15   Pieter Partridge, DO  spironolactone (ALDACTONE) 25 MG tablet Take 25 mg by mouth 2 (two) times daily.     [provider]  tiZANidine (ZANAFLEX) 2 MG tablet Take 1 tablet (2 mg total) by mouth every 6 (six) hours as needed for muscle spasms. 04/21/19   Caren Griffins, MD  topiramate (TOPAMAX) 25 MG tablet Take 1 tablet (25 mg total) by mouth at bedtime. 11/24/18   Jaffe, Adam R, DO  TOUJEO SOLOSTAR 300 UNIT/ML SOPN Inject 80 mg into the skin at bedtime.  05/22/14   [provider]  traMADol (ULTRAM) 50 MG tablet Take 0.5 tablets (25 mg total) by mouth every 8 (eight) hours as needed for moderate pain. 04/21/19   Caren Griffins, MD  vitamin B-12 (CYANOCOBALAMIN) 1000 MCG tablet Take 1,000  mcg by mouth daily.    [provider]    Physical Exam: Vitals:   06/06/19 1407 06/06/19 1430 06/06/19 1500 06/06/19 1545  BP: 102/73 120/89 106/69 116/66  Pulse: 97  88 87  Resp: 16 19 16 14   Temp: 98.1 F (36.7 C)     TempSrc: Oral     SpO2: 93%  95% 98%    Constitutional: Deconditioned, debilitated, elderly female, comfortable Vitals:   06/06/19 1407 06/06/19 1430 06/06/19 1500 06/06/19 1545  BP: 102/73 120/89 106/69 116/66  Pulse: 97  88 87  Resp: 16 19 16 14   Temp: 98.1 F (36.7 C)     TempSrc: Oral     SpO2: 93%  95% 98%   Eyes: PERRL, lids and conjunctivae normal ENMT: Mucous membranes are moist.  Neck: normal, supple, no masses, no thyromegaly Respiratory: clear to auscultation bilaterally, no wheezing, no crackles. Normal respiratory effort. No accessory muscle use.  Cardiovascular: Afib, no murmurs / rubs / gallops. No extremity edema. Systolic murmur Abdomen: no tenderness, no masses palpated. No hepatosplenomegaly. Bowel sounds positive.  Musculoskeletal: no clubbing / cyanosis. No joint deformity upper and lower extremities.  Skin: no  lesions, ulcers. No induration.Senile pupura,minor skin breakdown Neurologic: CN 2-12 grossly intact.  Strength 5/5 in all 4.  Psychiatric:Alert and awake but not oriented   Foley Catheter:None  Labs on Admission: I have personally reviewed following labs and imaging studies  CBC: Recent Labs  Lab 06/06/19 1410  WBC 9.2  NEUTROABS 6.5  HGB 15.3*  HCT 47.1*  MCV 90.4  PLT 809   Basic Metabolic Panel: Recent Labs  Lab 06/06/19 1410  NA 133*  K 4.2  CL 96*  CO2 23  GLUCOSE 203*  BUN 21  CREATININE 1.32*  CALCIUM 9.2   GFR: CrCl cannot be calculated (Unknown ideal weight.). Liver Function Tests: Recent Labs  Lab 06/06/19 1410  AST 26  ALT 21  ALKPHOS 63  BILITOT 0.9  PROT 6.0*  ALBUMIN 3.2*   No results for input(s): LIPASE, AMYLASE in the last 168 hours. No results for input(s): AMMONIA  in the last 168 hours. Coagulation Profile: No results for input(s): INR, PROTIME in the last 168 hours. Cardiac Enzymes: No results for input(s): CKTOTAL, CKMB, CKMBINDEX, TROPONINI in the last 168 hours. BNP (last 3 results) No results for input(s): PROBNP in the last 8760 hours. HbA1C: No results for input(s): HGBA1C in the last 72 hours. CBG: No results for input(s): GLUCAP in the last 168 hours. Lipid Profile: No results for input(s): CHOL, HDL, LDLCALC, TRIG, CHOLHDL, LDLDIRECT in the last 72 hours. Thyroid Function Tests: No results for input(s): TSH, T4TOTAL, FREET4, T3FREE, THYROIDAB in the last 72 hours. Anemia Panel: No results for input(s): VITAMINB12, FOLATE, FERRITIN, TIBC, IRON, RETICCTPCT in the last 72 hours. Urine analysis:    Component Value Date/Time   COLORURINE YELLOW 06/06/2019 1503   APPEARANCEUR CLOUDY (A) 06/06/2019 1503   LABSPEC 1.011 06/06/2019 1503   PHURINE 5.0 06/06/2019 1503   GLUCOSEU NEGATIVE 06/06/2019 1503   HGBUR NEGATIVE 06/06/2019 1503   BILIRUBINUR NEGATIVE 06/06/2019 1503   KETONESUR NEGATIVE 06/06/2019 1503   PROTEINUR 30 (A) 06/06/2019 1503   UROBILINOGEN 0.2 03/23/2014 1904   NITRITE NEGATIVE 06/06/2019 1503   LEUKOCYTESUR LARGE (A) 06/06/2019 1503    Radiological Exams on Admission: DG Chest Port 1 View  Result Date: 06/06/2019 CLINICAL DATA:  Weakness. Shortness of breath. Altered mental status. History of dementia. EXAM: PORTABLE CHEST 1 VIEW COMPARISON:  Radiographs 05/05/2019 and 04/16/2019. CT 05/05/2019. FINDINGS: 1517 hours. The heart size and mediastinal contours are stable. There is aortic atherosclerosis with aortic valvular and mitral annular calcifications. Left-greater-than-right bibasilar pulmonary opacities have not significantly changed, likely postinflammatory scarring. No superimposed airspace disease, edema, significant pleural effusion or pneumothorax. Telemetry leads overlie the chest. Stable mild thoracolumbar  scoliosis. IMPRESSION: Stable chest with chronic bibasilar opacities, likely postinflammatory scarring. No acute cardiopulmonary process. Electronically Signed   By: Richardean Sale M.D.   On: 06/06/2019 15:35     Assessment/Plan Active Problems:   Alzheimer's dementia ([place])   AKI (acute kidney injury) ([place])   Acute lower UTI   Lactic acidosis   UTI (urinary tract infection)   Urinary tract infection: Presented with altered mental status, lactic acidosis.  UA suggestive of urinary tract infection.  Urine was noted to be dark/cloudy at the nursing facility.  Started on ceftriaxone.  Will follow cultures.  Acute metabolic encephalopathy: Most likely secondary to UTI.  Continue to monitor mental status.  Permanent A. fib: Currently rate is well controlled.  Not on any rate controlling medications at present.  On Eliquis for anticoagulation which we will resume.    AKI on CKD stage II: Creatinine slightly increased from baseline.  Continue gentle IV fluids.  She is on spironolactone and Lasix at home which we will hold.  Alzheimer's disease: On baseline she is confused but alert and oriented to person only.  Continue supportive care.  She uses walker for ambulation.  Diabetes type 2: On insulin at baseline.  Continue to monitor CBGs.  Continue sliding scale insulin here.Check HbA1C  History of diastolic congestive heart failure: Currently looks dehydrated.  On Lasix and spironolactone at home which are on hold.  Echocardiogram done in 2019 showed ejection fraction of 65 to 96%, grade 1 diastolic dysfunction  Hypertension: Currently blood pressure soft.  Will hold antihypertensives.  History of nonhemorrhagic stroke: With residual left hemiparesis.  On statin.  Hyperlipidemia: On Lipitor  History of aortic stenosis: Follows with cardiology as an outpatient.  OSA: Uses CPAP.  Severity of Illness: The appropriate patient status for this patient is INPATIENT. Inpatient status is judged  to be reasonable and necessary in order to provide the required intensity of service to ensure the patient's safety. The patient's presenting symptoms, physical exam findings, and initial radiographic and laboratory data in the context of their chronic comorbidities is felt to place them at high risk for further clinical deterioration. Furthermore, it is not anticipated that the patient will be medically stable for discharge from the hospital within 2 midnights of admission. The following factors support the patient status of inpatient.   " The patient's presenting symptoms include altered mental status. " The worrisome physical exam findings include dehydration. " The initial radiographic and laboratory data are worrisome because of urinary tract infection, AKI " The chronic co-morbidities include A. fib, diabetes type 2.   * I certify that at the point of admission it is my clinical judgment that the patient will require inpatient hospital care spanning beyond 2 midnights from the point of admission due to high intensity of service, high risk for further deterioration and high frequency of surveillance required.*    DVT prophylaxis: Eliquis Code Status: Full Family Communication: Discussed with son on phone. Consults called: None     Shelly Coss MD [facility]  06/06/2019, 4:28 PM

## 2019-06-06 NOTE — ED Triage Notes (Signed)
Pt BIB [facility] from [facility]. Staff reports pt altered from her baseline, hx dementia. Staff reports urine dark and cloudy. EMS VSS.

## 2019-06-06 NOTE — ED Provider Notes (Signed)
[facility] EMERGENCY DEPARTMENT Provider Note   CSN: [id number] Arrival date & time: 06/06/19  1403     History Chief Complaint  Patient presents with  . Altered Mental Status    Kara Ortega is a 86 y.o. female.  Patient with hx afib, hx advanced dementia, from ECF for general weakness, decreased responsiveness, and dark and/or cloudy urine for past day. Patient has advanced dementia, not verbally responsive to questions - level 5 caveat. No report of fever. No report of trauma or fall.   The history is provided by the patient, the EMS personnel and the nursing home. The history is limited by the condition of the patient.  Altered Mental Status      Past Medical History:  Diagnosis Date  . A-fib ([place])   . Abnormal heart rhythms   . Alzheimer disease ([place])   . Anginal pain ([place])   . Back pain   . CHF (congestive heart failure) ([place])   . Chronic kidney disease    STAGE 3   . COPD (chronic obstructive pulmonary disease) ([place])   . Depression   . Diabetes ([place])    insulin dependent  . Hypertension   . Leg pain   . Memory loss   . Neuropathy   . Shortness of breath   . Sleep apnea    uses bipap X12 years.  . Stroke [facility])    LEFT SIDE RESIDUAL    Patient Active Problem List   Diagnosis Date Noted  . Dementia without behavioral disturbance ([place])   . OSA on CPAP   . Palliative care encounter   . Acute respiratory failure due to COVID-19 ([place]) 04/13/2019  . Essential tremor 09/14/2014  . High cholesterol 05/19/2014  . Persistent atrial fibrillation ([place]) 05/19/2014  . Long-term use of high-risk medication 05/19/2014  . Accident due to mechanical fall without injury 03/24/2014  . Diastolic dysfunction with chronic heart failure ([place]) 03/24/2014  . Sinus bradycardia 03/24/2014  . Hypotension 03/24/2014  . OSA (obstructive sleep apnea) 03/24/2014  . Warfarin anticoagulation 03/24/2014  . Aortic stenosis 03/24/2014  . Mitral stenosis  03/24/2014  . DOE (dyspnea on exertion) 03/24/2014  . Recurrent falls 03/24/2014  . AKI (acute kidney injury) ([place]) 03/23/2014  . Alzheimer's dementia ([place]) 12/26/2013  . Unstable angina ([place]) 09/14/2013  . Diarrhea 09/14/2013  . Chest pain at rest- most likely GI related 09/14/2013  . CHF (congestive heart failure) ([place])   . A-fib ([name])   . Diabetes ([place])   . Dyspnea on exertion 03/25/2013    Past Surgical History:  Procedure Laterality Date  . BREAST LUMPECTOMY    . BREAST SURGERY     X4  . COLON SURGERY     removed 14 inches of colon  . HERNIA REPAIR       OB History   No obstetric history on file.     Family History  Problem Relation Age of Onset  . Dementia Maternal Grandmother   . Heart disease Mother   . Heart disease Sister   . Alzheimer's disease Sister   . Dementia Maternal Aunt   . Mental illness Other     Social History   Tobacco Use  . Smoking status: Former Smoker    Packs/day: 1.50    Years: 18.00    Pack years: 27.00    Types: Cigarettes    Quit date: 03/31/1968    Years since quitting: 51.2  . Smokeless tobacco: Never Used  Substance Use Topics  .  Alcohol use: No    Alcohol/week: 0.0 standard drinks  . Drug use: No    Home Medications Prior to Admission medications   Medication Sig Start Date End Date Taking? Authorizing Provider  acetaminophen (TYLENOL) 325 MG tablet Take 650 mg by mouth every 6 (six) hours as needed.    [provider]  allopurinol (ZYLOPRIM) 100 MG tablet Take 200 mg by mouth daily.     [provider]  ALPRAZolam Duanne Moron) 0.25 MG tablet Take 1 tablet (0.25 mg total) by mouth every 12 (twelve) hours as needed for anxiety or sleep. 04/21/19   Caren Griffins, MD  apixaban (ELIQUIS) 5 MG TABS tablet Take 1 tablet (5 mg total) by mouth 2 (two) times daily. 02/19/15   Isaiah Serge, NP  Ascorbic Acid (VITAMIN C) 1000 MG tablet Take 1,000 mg by mouth daily.    [provider]  atorvastatin  (LIPITOR) 10 MG tablet Take 10 mg by mouth at bedtime.     [provider]  B-D UF III MINI PEN NEEDLES 31G X 5 MM MISC U UTD TO INJECT INSULIN 02/25/15   [provider]  Cholecalciferol (HM VITAMIN D3) 4000 UNITS CAPS Take 1,000 Units by mouth daily.     [provider]  citalopram (CELEXA) 20 MG tablet Take 20 mg by mouth daily.    [provider]  clotrimazole (LOTRIMIN) 1 % cream Apply 1 application topically 2 (two) times daily. To toe    [provider]  diclofenac sodium (VOLTAREN) 1 % GEL Apply 4 g topically 4 (four) times daily as needed (For pain.).     [provider]  docusate sodium (COLACE) 100 MG capsule Take 100 mg by mouth daily as needed for mild constipation.    [provider]  furosemide (LASIX) 20 MG tablet Take 20 mg by mouth daily.     [provider]  guaiFENesin (MUCINEX) 600 MG 12 hr tablet Take by mouth every 12 (twelve) hours as needed for cough or to loosen phlegm.    [provider]  hydrocortisone (ANUSOL-HC) 25 MG suppository Place 25 mg rectally 2 (two) times daily as needed for hemorrhoids.     [provider]  insulin lispro (HUMALOG) 100 UNIT/ML injection Inject 16 Units into the skin once.    [provider]  isosorbide mononitrate (IMDUR) 60 MG 24 hr tablet Take 1 tablet (60 mg total) by mouth daily. 04/27/17   Isaiah Serge, NP  ketoconazole (NIZORAL) 2 % cream Apply 1 application topically as needed. For rash 08/12/18   [provider]  Liraglutide (VICTOZA) 18 MG/3ML SOPN Inject 1.2 mg into the skin daily.     [provider]  loperamide (IMODIUM A-D) 2 MG tablet Take 2 mg by mouth 4 (four) times daily as needed for diarrhea or loose stools.    [provider]  Melatonin 5 MG TABS Take 10 mg by mouth at bedtime.    [provider]  methocarbamol (ROBAXIN) 500 MG tablet Take 500 mg by mouth every 6 (six) hours as needed (back  pain).     [provider]  nitroGLYCERIN (NITROSTAT) 0.4 MG SL tablet Place 1 tablet (0.4 mg total) under the tongue every 5 (five) minutes as needed for chest pain. 05/19/14   Jerline Pain, MD  nystatin (MYCOSTATIN/NYSTOP) powder Apply 1 application topically 2 (two) times daily. Under breast    [provider]  Polyethyl Glycol-Propyl Glycol (SYSTANE) 0.4-0.3 %  SOLN Place 1 drop into both eyes 2 (two) times a day.    [provider]  pregabalin (LYRICA) 100 MG capsule Take 1 capsule (100 mg total) by mouth 2 (two) times daily. Patient taking differently: Take 100-200 mg by mouth 2 (two) times daily. Give 1 capsule (100mg ) in th am, then 2 capsules (200mg ) in the evening 07/25/16   Pollina, Gwenyth Allegra, MD  psyllium (METAMUCIL SMOOTH TEXTURE) 28 % packet Take 1 packet by mouth daily as needed (constipation).    [provider]  Rivastigmine 13.3 MG/24HR PT24 Place 1 patch (13.3 mg total) onto the skin daily. 04/27/15   Pieter Partridge, DO  spironolactone (ALDACTONE) 25 MG tablet Take 25 mg by mouth 2 (two) times daily.     [provider]  tiZANidine (ZANAFLEX) 2 MG tablet Take 1 tablet (2 mg total) by mouth every 6 (six) hours as needed for muscle spasms. 04/21/19   Caren Griffins, MD  topiramate (TOPAMAX) 25 MG tablet Take 1 tablet (25 mg total) by mouth at bedtime. 11/24/18   Jaffe, Adam R, DO  TOUJEO SOLOSTAR 300 UNIT/ML SOPN Inject 80 mg into the skin at bedtime.  05/22/14   [provider]  traMADol (ULTRAM) 50 MG tablet Take 0.5 tablets (25 mg total) by mouth every 8 (eight) hours as needed for moderate pain. 04/21/19   Caren Griffins, MD  vitamin B-12 (CYANOCOBALAMIN) 1000 MCG tablet Take 1,000 mcg by mouth daily.    [provider]    Allergies    Avandia [rosiglitazone], Benazepril, Dilaudid [hydromorphone hcl], Fosamax [alendronate sodium], Talwin [pentazocine], Tetanus toxoids, and Zyrtec [cetirizine]  Review of Systems    Review of Systems  Unable to perform ROS: Dementia  level 5 caveat - pt not verbally responsive to questions.    Physical Exam Updated Vital Signs BP 102/73 (BP Location: Right Arm)   Pulse 97   Temp 98.1 F (36.7 C) (Oral)   Resp 16   SpO2 93%   Physical Exam Vitals and nursing note reviewed.  Constitutional:      Appearance: Normal appearance. She is well-developed.  HENT:     Head: Atraumatic.     Nose: Nose normal.     Mouth/Throat:     Mouth: Mucous membranes are moist.  Eyes:     General: No scleral icterus.    Extraocular Movements: Extraocular movements intact.     Conjunctiva/sclera: Conjunctivae normal.     Pupils: Pupils are equal, round, and reactive to light.  Neck:     Trachea: No tracheal deviation.     Comments: No stiffness or rigidity. Cardiovascular:     Rate and Rhythm: Normal rate and regular rhythm.     Pulses: Normal pulses.     Heart sounds: Normal heart sounds. No murmur. No friction rub. No gallop.   Pulmonary:     Effort: Pulmonary effort is normal. No respiratory distress.     Breath sounds: Normal breath sounds.  Abdominal:     General: Bowel sounds are normal. There is no distension.     Palpations: Abdomen is soft.     Tenderness: There is no abdominal tenderness. There is no guarding.  Genitourinary:    Comments: No cva tenderness.  Musculoskeletal:        General: No swelling or tenderness.     Cervical back: Normal range of motion and neck supple. No rigidity. No muscular tenderness.  Skin:    General: Skin is warm and dry.  Findings: No rash.  Neurological:     Mental Status: She is alert.     Comments: Sitting upright on stretcher, eyes open. Appears content, no acute distress. Moves bil extremities purposefully.   Psychiatric:        Mood and Affect: Mood normal.     ED Results / Procedures / Treatments   Labs (all labs ordered are listed, but only abnormal results are displayed) Results for orders placed or  performed during the hospital encounter of 06/06/19  Lactic acid, plasma  Result Value Ref Range   Lactic Acid, Venous 3.4 (HH) 0.5 - 1.9 mmol/L  Comprehensive metabolic panel  Result Value Ref Range   Sodium 133 (L) 135 - 145 mmol/L   Potassium 4.2 3.5 - 5.1 mmol/L   Chloride 96 (L) 98 - 111 mmol/L   CO2 23 22 - 32 mmol/L   Glucose, Bld 203 (H) 70 - 99 mg/dL   BUN 21 8 - 23 mg/dL   Creatinine, Ser 1.32 (H) 0.44 - 1.00 mg/dL   Calcium 9.2 8.9 - 10.3 mg/dL   Total Protein 6.0 (L) 6.5 - 8.1 g/dL   Albumin 3.2 (L) 3.5 - 5.0 g/dL   AST 26 15 - 41 U/L   ALT 21 0 - 44 U/L   Alkaline Phosphatase 63 38 - 126 U/L   Total Bilirubin 0.9 0.3 - 1.2 mg/dL   GFR calc non Af Amer 36 (L) >60 mL/min   GFR calc Af Amer 42 (L) >60 mL/min   Anion gap 14 5 - 15  CBC with Differential  Result Value Ref Range   WBC 9.2 4.0 - 10.5 K/uL   RBC 5.21 (H) 3.87 - 5.11 MIL/uL   Hemoglobin 15.3 (H) 12.0 - 15.0 g/dL   HCT 47.1 (H) 36.0 - 46.0 %   MCV 90.4 80.0 - 100.0 fL   MCH 29.4 26.0 - 34.0 pg   MCHC 32.5 30.0 - 36.0 g/dL   RDW 18.7 (H) 11.5 - 15.5 %   Platelets 242 150 - 400 K/uL   nRBC 0.0 0.0 - 0.2 %   Neutrophils Relative % 72 %   Neutro Abs 6.5 1.7 - 7.7 K/uL   Lymphocytes Relative 16 %   Lymphs Abs 1.5 0.7 - 4.0 K/uL   Monocytes Relative 10 %   Monocytes Absolute 0.9 0.1 - 1.0 K/uL   Eosinophils Relative 1 %   Eosinophils Absolute 0.1 0.0 - 0.5 K/uL   Basophils Relative 0 %   Basophils Absolute 0.0 0.0 - 0.1 K/uL   Immature Granulocytes 1 %   Abs Immature Granulocytes 0.10 (H) 0.00 - 0.07 K/uL  Urinalysis, Routine w reflex microscopic  Result Value Ref Range   Color, Urine YELLOW YELLOW   APPearance CLOUDY (A) CLEAR   Specific Gravity, Urine 1.011 1.005 - 1.030   pH 5.0 5.0 - 8.0   Glucose, UA NEGATIVE NEGATIVE mg/dL   Hgb urine dipstick NEGATIVE NEGATIVE   Bilirubin Urine NEGATIVE NEGATIVE   Ketones, ur NEGATIVE NEGATIVE mg/dL   Protein, ur 30 (A) NEGATIVE mg/dL   Nitrite NEGATIVE  NEGATIVE   Leukocytes,Ua LARGE (A) NEGATIVE   RBC / HPF 0-5 0 - 5 RBC/hpf   WBC, UA 21-50 0 - 5 WBC/hpf   Bacteria, UA FEW (A) NONE SEEN   WBC Clumps PRESENT    Hyaline Casts, UA PRESENT    Amorphous Crystal PRESENT     EKG EKG Interpretation  Date/Time:  Monday June 06 2019 14:04:39 EST  Ventricular Rate:  95 PR Interval:  166 QRS Duration: 76 QT Interval:  368 QTC Calculation: 462 R Axis:   34 Text Interpretation: Sinus rhythm with Premature atrial complexes No significant change since last tracing Confirmed by Lajean Saver [phone]) on 06/06/2019 2:09:57 PM   Radiology DG Chest Port 1 View  Result Date: 06/06/2019 CLINICAL DATA:  Weakness. Shortness of breath. Altered mental status. History of dementia. EXAM: PORTABLE CHEST 1 VIEW COMPARISON:  Radiographs 05/05/2019 and 04/16/2019. CT 05/05/2019. FINDINGS: 1517 hours. The heart size and mediastinal contours are stable. There is aortic atherosclerosis with aortic valvular and mitral annular calcifications. Left-greater-than-right bibasilar pulmonary opacities have not significantly changed, likely postinflammatory scarring. No superimposed airspace disease, edema, significant pleural effusion or pneumothorax. Telemetry leads overlie the chest. Stable mild thoracolumbar scoliosis. IMPRESSION: Stable chest with chronic bibasilar opacities, likely postinflammatory scarring. No acute cardiopulmonary process. Electronically Signed   By: Richardean Sale M.D.   On: 06/06/2019 15:35    Procedures Procedures (including critical care time)  Medications Ordered in ED Medications  sodium chloride flush (NS) 0.9 % injection 3 mL (3 mLs Intravenous Given 06/06/19 1428)    ED Course  I have reviewed the triage vital signs and the nursing notes.  Pertinent labs & imaging results that were available during my care of the patient were reviewed by me and considered in my medical decision making (see chart for details).    MDM  Rules/Calculators/A&P                     Continuous pulse ox and monitor.  o2 [place]. Iv ns.  Stat labs sent.  Reviewed nursing notes and prior charts for additional history.  Patient has gold DNR form on chart.  Labs reviewed/interpreted by me - initial lactate is high. AKI on labs. UA is pending. Blood cultures added.  Iv antibiotis given.   Given lactate, soft bp, aki - 30 cc/kg ns iv.   Await additional labs.   Additional labs reviewed/interpreted by me - UA c/w uti. Urine culture sent.   CXR reviewed/interpreted by me - no acute pna.   Given elevated lactate, uti, AKI - felt consistent with sepsis/urosepsis.   Hospitalists consulted for admission.  CRITICAL CARE RE: sepsis with elevated lactate, soft blood pressure, weakness, aki.  Performed by: Mirna Mires Total critical care time: 40 minutes Critical care time was exclusive of separately billable procedures and treating other patients. Critical care was necessary to treat or prevent imminent or life-threatening deterioration. Critical care was time spent personally by me on the following activities: development of treatment plan with patient and/or surrogate as well as nursing, discussions with consultants, evaluation of patient's response to treatment, examination of patient, obtaining history from patient or surrogate, ordering and performing treatments and interventions, ordering and review of laboratory studies, ordering and review of radiographic studies, pulse oximetry and re-evaluation of patient's condition.   Final Clinical Impression(s) / ED Diagnoses Final diagnoses:  None    Rx / DC Orders ED Discharge Orders    None       Lajean Saver, MD 06/06/19 [phone]

## 2019-06-07 DIAGNOSIS — G300 Alzheimer's disease with early onset: Secondary | ICD-10-CM

## 2019-06-07 DIAGNOSIS — N179 Acute kidney failure, unspecified: Secondary | ICD-10-CM

## 2019-06-07 LAB — GLUCOSE, CAPILLARY
Glucose-Capillary: 62 mg/dL — ABNORMAL LOW (ref 70–99)
Glucose-Capillary: 64 mg/dL — ABNORMAL LOW (ref 70–99)
Glucose-Capillary: 133 mg/dL — ABNORMAL HIGH (ref 70–99)
Glucose-Capillary: 145 mg/dL — ABNORMAL HIGH (ref 70–99)
Glucose-Capillary: 155 mg/dL — ABNORMAL HIGH (ref 70–99)
Glucose-Capillary: 179 mg/dL — ABNORMAL HIGH (ref 70–99)

## 2019-06-07 LAB — CBC
HCT: 40.5 % (ref 36.0–46.0)
Hemoglobin: 13.2 g/dL (ref 12.0–15.0)
MCH: 29.7 pg (ref 26.0–34.0)
MCHC: 32.6 g/dL (ref 30.0–36.0)
MCV: 91 fL (ref 80.0–100.0)
Platelets: 207 10*3/uL (ref 150–400)
RBC: 4.45 MIL/uL (ref 3.87–5.11)
RDW: 18.6 % — ABNORMAL HIGH (ref 11.5–15.5)
WBC: 9.4 10*3/uL (ref 4.0–10.5)
nRBC: 0 % (ref 0.0–0.2)

## 2019-06-07 LAB — BASIC METABOLIC PANEL
Anion gap: 14 (ref 5–15)
BUN: 14 mg/dL (ref 8–23)
CO2: 21 mmol/L — ABNORMAL LOW (ref 22–32)
Calcium: 8.3 mg/dL — ABNORMAL LOW (ref 8.9–10.3)
Chloride: 105 mmol/L (ref 98–111)
Creatinine, Ser: 0.79 mg/dL (ref 0.44–1.00)
GFR calc Af Amer: >60 mL/min (ref >60)
GFR calc non Af Amer: >60 mL/min (ref >60)
Glucose, Bld: 65 mg/dL — ABNORMAL LOW (ref 70–99)
Potassium: 3 mmol/L — ABNORMAL LOW (ref 3.5–5.1)
Sodium: 140 mmol/L (ref 135–145)

## 2019-06-07 LAB — PROCALCITONIN: Procalcitonin: <0.1 ng/mL

## 2019-06-07 MED ORDER — KCL IN DEXTROSE-NACL 20-5-0.45 MEQ/L-%-% IV SOLN
INTRAVENOUS | Status: AC
  Filled 2019-06-07: qty 1000

## 2019-06-07 MED ORDER — POTASSIUM CHLORIDE CRYS ER 20 MEQ PO TBCR
40.0000 meq | EXTENDED_RELEASE_TABLET | Freq: Once | ORAL | Status: AC
  Administered 2019-06-07: 40 meq via ORAL
  Filled 2019-06-07: qty 2

## 2019-06-07 MED ORDER — FAMOTIDINE 20 MG PO TABS
20.0000 mg | ORAL_TABLET | Freq: Two times a day (BID) | ORAL | Status: DC
  Administered 2019-06-07 – 2019-06-14 (×11): 20 mg via ORAL
  Filled 2019-06-07 (×13): qty 1

## 2019-06-07 MED ORDER — DEXTROSE 50 % IV SOLN
25.0000 mL | Freq: Once | INTRAVENOUS | Status: AC
  Administered 2019-06-07: 25 mL via INTRAVENOUS

## 2019-06-07 MED ORDER — SODIUM CHLORIDE 0.9 % IV SOLN
2.0000 g | INTRAVENOUS | Status: DC
  Administered 2019-06-07: 2 g via INTRAVENOUS

## 2019-06-07 MED ORDER — VITAMIN B-12 1000 MCG PO TABS
1000.0000 ug | ORAL_TABLET | Freq: Every day | ORAL | Status: DC
  Administered 2019-06-07 – 2019-06-14 (×6): 1000 ug via ORAL
  Filled 2019-06-07 (×7): qty 1

## 2019-06-07 MED ORDER — ISOSORBIDE MONONITRATE ER 60 MG PO TB24
60.0000 mg | ORAL_TABLET | Freq: Every day | ORAL | Status: DC
  Administered 2019-06-07 – 2019-06-14 (×6): 60 mg via ORAL
  Filled 2019-06-07 (×7): qty 1

## 2019-06-07 MED ORDER — DEXTROSE 50 % IV SOLN
INTRAVENOUS | Status: AC
  Filled 2019-06-07: qty 50

## 2019-06-07 MED ORDER — RIVASTIGMINE 13.3 MG/24HR TD PT24: 13.3000 mg | MEDICATED_PATCH | TRANSDERMAL | Status: DC

## 2019-06-07 MED ORDER — NITROGLYCERIN 0.4 MG SL SUBL: 0.4000 mg | SUBLINGUAL_TABLET | SUBLINGUAL | Status: DC | PRN

## 2019-06-07 MED ORDER — KCL IN DEXTROSE-NACL 20-5-0.45 MEQ/L-%-% IV SOLN
INTRAVENOUS | Status: DC
  Filled 2019-06-07: qty 1000

## 2019-06-07 NOTE — Evaluation (Signed)
Clinical/Bedside Swallow Evaluation Patient Details  Name: Kara Ortega MRN: [id number] Date of Birth: 02-28-1930  Today's Date: 06/07/2019 Time: SLP Start Time (ACUTE ONLY): 82 SLP Stop Time (ACUTE ONLY): 1435 SLP Time Calculation (min) (ACUTE ONLY): 11 min  Past Medical History:  Past Medical History:  Diagnosis Date  . A-fib ([place])   . Abnormal heart rhythms   . Alzheimer disease ([place])   . Anginal pain ([place])   . Back pain   . CHF (congestive heart failure) ([place])   . Chronic kidney disease    STAGE 3   . COPD (chronic obstructive pulmonary disease) ([place])   . Depression   . Diabetes ([place])    insulin dependent  . Hypertension   . Leg pain   . Memory loss   . Neuropathy   . Shortness of breath   . Sleep apnea    uses bipap X12 years.  . Stroke ([place])    LEFT SIDE RESIDUAL   Past Surgical History:  Past Surgical History:  Procedure Laterality Date  . BREAST LUMPECTOMY    . BREAST SURGERY     X4  . COLON SURGERY     removed 14 inches of colon  . HERNIA REPAIR     HPI:  86 yo female admitted to ED on 3/8 from [facility] for UTI, AMS. Pt with recent 04/2019 admission for COVID. PMH includes dementia, afib, CHF, COPD, HTN, neuropathy, CVA with L weakness.   Assessment / Plan / Recommendation Clinical Impression   Pt encountered upright in bed. Patient confused, somewhat agitated, with language of confusion. Oral mechanism exam limited by patient's mentation (unable to follow commands, agitated). However, natural dentition present, oral mucosa appeared clear. No obvious facial asymmetry. Voice quality clear.  Pt seen with straw sips thin liquid (water): good oral acceptance, adequate AP transit, no overt s/sx aspiration. Pt seen with trial of soft solids (chopped peach): pt with adequate oral acceptance, minimally prolonged mastication, good bolus cohesion, swallow appeared timely, no overt s/sx aspiration. Patient refusing further trials of thin liquids, refusing regular  solids (graham cracker) both verbally and with guarding. Pt with increasing agitation, ST ended this assessment.  Based on clinical presentation when seen today, patient appears to have swallow function that is grossly WNL. CXR from 3/8: Stable chest with chronic bibasilar opacities, likely postinflammatory scarring. D/w RN: RN reports no observation of s/sx dysphagia with breakfast meal or pills.  ST to sign off at this time. Please re-consult should patient demonstrate s/sx of dysphagia. SLP Visit Diagnosis: Dysphagia, unspecified (R13.10)    Aspiration Risk       Diet Recommendation Dysphagia 3 (Mech soft);Thin liquid        Other  Recommendations Oral Care Recommendations: Oral care BID   Follow up Recommendations        Frequency and Duration            Prognosis        Swallow Study   General HPI: 86 yo female admitted to ED on 3/8 from [facility] for UTI, AMS. Pt with recent 04/2019 admission for COVID. PMH includes dementia, afib, CHF, COPD, HTN, neuropathy, CVA with L weakness. Type of Study: Bedside Swallow Evaluation Diet Prior to this Study: Dysphagia 3 (soft);Thin liquids Temperature Spikes Noted: No Respiratory Status: Room air History of Recent Intubation: No Behavior/Cognition: Confused;Agitated Oral Cavity Assessment: Within Functional Limits Oral Care Completed by SLP: No Oral Cavity - Dentition: Adequate natural dentition Vision: Functional for self-feeding Patient Positioning:  Upright in bed Baseline Vocal Quality: Normal Volitional Cough: Cognitively unable to elicit    Oral/Motor/Sensory Function Overall Oral Motor/Sensory Function: Within functional limits   Ice Chips Ice chips: Not tested   Thin Liquid Thin Liquid: Within functional limits Presentation: Straw    Nectar Thick Nectar Thick Liquid: Not tested   Honey Thick Honey Thick Liquid: Not tested   Puree Puree: Not tested   Solid     Solid: Within functional limits      Kara Ortega 06/07/2019,2:43 PM   Marina Goodell, M.Ed., [facility] [phone]: Acute Rehab office [phone] - pager

## 2019-06-07 NOTE — Plan of Care (Signed)
  Problem: Nutrition: Goal: Adequate nutrition will be maintained Outcome: Progressing   

## 2019-06-07 NOTE — Progress Notes (Signed)
PHARMACY - PHYSICIAN COMMUNICATION CRITICAL VALUE ALERT - BLOOD CULTURE IDENTIFICATION (BCID)  Kara Ortega is an 86 y.o. female who presented to [facility] on 06/06/2019 with a chief complaint of AMS, generalized weakness.  Assessment: 1/4 anaerobic bottle Gram stain growing Gram positive cocci in chains  Name of physician (or Provider) Contacted: Dr. Candiss Norse  Current antibiotics: ceftriaxone 1 gm IV q24h for empiric UTI coverage  Changes to prescribed antibiotics recommended:  Increase ceftriaxone dose to 2 gm IV q24h for possible bacteremia.   No results found for this or any previous visit.  Berenice Bouton, PharmD PGY1 Pharmacy Resident  Please check AMION for all [facility] phone numbers After 10:00 PM, call [facility] [phone]  06/07/2019  9:52 AM

## 2019-06-07 NOTE — Progress Notes (Addendum)
PROGRESS NOTE                                                                                                                                                                                                             Patient Demographics:    Kara Ortega, is a 86 y.o. female, DOB - [birth year], KHT:[id number]  Outpatient Primary MD for the patient is Carol Ada, MD    LOS - 1  Admit date - 06/06/2019    Chief Complaint  Patient presents with  . Altered Mental Status       Brief Narrative   Kara Ortega is a 86 y.o. female with medical history significant of permanent A. fib, CKD stage II,  advanced dementia, diabetes type 2 on insulin, hypertension, history of nonhemorrhagic stroke with left-sided hemiparesis , Covid infection in January who presented from memory care nursing facility with complaints of hallucinations, general weakness, decreased responsiveness & dark/cloudy urine, he was diagnosed with dehydration, AKI, toxic encephalopathy due to UTI and admitted to the hospital.   Subjective:    Kara Ortega today has, No headache, No chest pain, No abdominal pain - No Nausea, No new weakness tingling or numbness, no SOB.   Assessment  & Plan :    1.  Toxic and metabolic encephalopathy due to dehydration and UTI . Is getting IVF and Rocephin, follow Ur and blood cultures, no new FN deficits - mentation improving, PT- OT, SLP to see, stable.  Addendum.  Was called by lab that 1 out of 4 cultures drawn in the ER growing gram-positive cocci in chains.  Most likely strep pneumonia, discussed with ID, Rocephin increased to 2 g and repeat blood cultures drawn on 06/07/2019.  Follow these cultures.  Based on the results in 48 hours discuss again with ID.  Will also trend procalcitonin and WBC count.    2. History of COVID-19 pneumonia.  Resolved.  SpO2: 98 %  Recent Labs  Lab 06/06/19 1755  SARSCOV2NAA  NEGATIVE    Hepatic Function Latest Ref Rng & Units 06/06/2019 04/21/2019 04/20/2019  Total Protein 6.5 - 8.1 g/dL 6.0(L) 5.6(L) 5.6(L)  Albumin 3.5 - 5.0 g/dL 3.2(L) 2.9(L) 3.0(L)  AST 15 - 41 U/L 26 79(H) 105(H)  ALT 0 - 44 U/L 21 156(H) 156(H)  Alk Phosphatase 38 - 126 U/L 63 65 67  Total Bilirubin 0.3 - 1.2 mg/dL 0.9 1.1 0.8       ABG     Component Value Date/Time   HCO3 24.6 (H) 03/23/2014 1957   TCO2 26 03/23/2014 1957   ACIDBASEDEF 2.0 03/23/2014 1957   O2SAT 78.0 03/23/2014 1957     2.  Dehydration, AKI, hypokalemia - IVF, replace K, AKI resolved.  3. Permanent Afib, [place] VASC 2 Score of > 4 -on Eliquis continue.  History of CVA in the past.  4.  History of CVA with mild left-sided weakness.  Eliquis along with statin for secondary prevention and supportive care.  5.  Underlying Alzheimer's dementia.  Remains at risk for delirium.  6.  GERD.  Resume Zantac.  7.  Hypertension.  Resumed Imdur, diuretics on hold.  8.  Dyslipidemia.  On statin.    9.  History of gout.  Continue allopurinol.    10.  History of chronic diastolic CHF EF 90%, aortic stenosis.  Currently diuretics on hold.  Resume in the a.m.    11. DM type II.  On high-dose insulin at home, hypoglycemic this a.m., for now sliding scale only, monitor and adjust as needed.   Lab Results  Component Value Date   HGBA1C 8.1 (H) 06/06/2019   CBG (last 3)  Recent Labs    06/07/19 0652 06/07/19 0719 06/07/19 0747  GLUCAP 62* 64* 133*     Condition - Fair Family Communication  :  Son 06/07/19  Code Status :  Full  Diet :   Diet Order            DIET SOFT Room service appropriate? Yes; Fluid consistency: Thin  Diet effective now               Disposition Plan  :  SNF, in the hospital getting treatment for dehydration, hypokalemia, UTI and AMS.  Consults  :  None  Procedures  :  None  PUD Prophylaxis : None  DVT Prophylaxis  :  Eliquis  Lab Results  Component Value Date   PLT 207  06/07/2019    Inpatient Medications  Scheduled Meds: . allopurinol  200 mg Oral Daily  . apixaban  5 mg Oral BID  . atorvastatin  10 mg Oral QHS  . famotidine  20 mg Oral BID  . insulin aspart  0-9 Units Subcutaneous TID WC  . isosorbide mononitrate  60 mg Oral Daily  . potassium chloride  40 mEq Oral Once  . rivastigmine  13.3 mg Transdermal Q24H  . topiramate  25 mg Oral QHS  . vitamin B-12  1,000 mcg Oral Daily   Continuous Infusions: . cefTRIAXone (ROCEPHIN)  IV Stopped (06/06/19 1747)  . dextrose 5 % and 0.45 % NaCl with KCl 20 mEq/L 50 mL/hr at 06/07/19 0842   PRN Meds:.acetaminophen, ALPRAZolam, nitroGLYCERIN, traMADol  Antibiotics  :    Anti-infectives (From admission, onward)   Start     Dose/Rate Route Frequency Ordered Stop   06/06/19 1630  cefTRIAXone (ROCEPHIN) 1 g in sodium chloride 0.9 % 100 mL IVPB  Status:  Discontinued     1 g 200 mL/hr over 30 Minutes Intravenous  Once 06/06/19 1619 06/06/19 1626   06/06/19 1630  cefTRIAXone (ROCEPHIN) 1 g in sodium chloride 0.9 % 100 mL IVPB     1 g 200 mL/hr over 30 Minutes Intravenous Every 24 hours 06/06/19 1620         Time Spent in minutes  [place]  M.D on 06/07/2019 at 9:47 AM  To page go to [email or web address] - password [email or web address]  [facility] -  Office  [phone] ,  See all Orders from today for further details    Objective:   Vitals:   06/06/19 1800 06/06/19 1846 06/06/19 2040 06/07/19 0451  BP: (!) 114/58 (!) 101/55 (!) 111/52 (!) 108/59  Pulse:  87 94 70  Resp: '14 16 12 12  '$ Temp:  97.8 F (36.6 C) 99.3 F (37.4 C) 98.3 F (36.8 C)  TempSrc:  Oral Oral Oral  SpO2:  100% 98% 98%    Wt Readings from Last 3 Encounters:  04/14/19 96.2 kg  07/17/18 96.2 kg  06/07/18 97.2 kg     Intake/Output Summary (Last 24 hours) at 06/07/2019 0947 Last data filed at 06/07/2019 0600 Gross per 24 hour  Intake 3067.75 ml  Output 500 ml  Net 2567.75 ml     Physical Exam  Awake but pleasantly  confused, no new F.N deficits - old L sided weakness , [place].AT,PERRAL Supple Neck,No JVD, No cervical lymphadenopathy appriciated.  Symmetrical Chest wall movement, Good air movement bilaterally, CTAB RRR,No Gallops,Rubs or new Murmurs, No Parasternal Heave +ve B.Sounds, Abd Soft, No tenderness, No organomegaly appriciated, No rebound - guarding or rigidity. No Cyanosis, Clubbing or edema, No new Rash or bruise  ,    Data Review:    CBC Recent Labs  Lab 06/06/19 1410 06/07/19 0509  WBC 9.2 9.4  HGB 15.3* 13.2  HCT 47.1* 40.5  PLT 242 207  MCV 90.4 91.0  MCH 29.4 29.7  MCHC 32.5 32.6  RDW 18.7* 18.6*  LYMPHSABS 1.5  --   MONOABS 0.9  --   EOSABS 0.1  --   BASOSABS 0.0  --     Chemistries  Recent Labs  Lab 06/06/19 1410 06/07/19 0509  NA 133* 140  K 4.2 3.0*  CL 96* 105  CO2 23 21*  GLUCOSE 203* 65*  BUN 21 14  CREATININE 1.32* 0.79  CALCIUM 9.2 8.3*  AST 26  --   ALT 21  --   ALKPHOS 63  --   BILITOT 0.9  --    ------------------------------------------------------------------------------------------------------------------ No results for input(s): CHOL, HDL, LDLCALC, TRIG, CHOLHDL, LDLDIRECT in the last 72 hours.  Lab Results  Component Value Date   HGBA1C 8.1 (H) 06/06/2019   ------------------------------------------------------------------------------------------------------------------ No results for input(s): TSH, T4TOTAL, T3FREE, THYROIDAB in the last 72 hours.  Invalid input(s): FREET3  Cardiac Enzymes No results for input(s): CKMB, TROPONINI, MYOGLOBIN in the last 168 hours.  Invalid input(s): CK ------------------------------------------------------------------------------------------------------------------    Component Value Date/Time   BNP 107.0 (H) 03/23/2014 1706    Micro Results Recent Results (from the past 240 hour(s))  SARS CORONAVIRUS 2 (TAT 6-24 HRS) Nasopharyngeal Nasopharyngeal Swab     Status: None   Collection Time:  06/06/19  5:55 PM   Specimen: Nasopharyngeal Swab  Result Value Ref Range Status   SARS Coronavirus 2 NEGATIVE NEGATIVE Final    Comment: (NOTE) SARS-CoV-2 target nucleic acids are NOT DETECTED. The SARS-CoV-2 RNA is generally detectable in upper and lower respiratory specimens during the acute phase of infection. Negative results do not preclude SARS-CoV-2 infection, do not rule out co-infections with other pathogens, and should not be used as the sole basis for treatment or other patient management decisions. Negative results must be combined with clinical observations, patient history, and epidemiological information. The expected result is Negative. Fact Sheet for Patients: SugarRoll.be Fact Sheet for Healthcare Providers:  https://www.woods-mathews.com/ This test is not yet approved or cleared by the [place] and  has been authorized for detection and/or diagnosis of SARS-CoV-2 by FDA under an Emergency Use Authorization (EUA). This EUA will remain  in effect (meaning this test can be used) for the duration of the COVID-19 declaration under Section 56 4(b)(1) of the Act, 21 U.S.C. section 360bbb-3(b)(1), unless the authorization is terminated or revoked sooner. Performed at [facility], [place] [address] [place], [place] [address]     Radiology Reports DG Chest [place] 1 View  Result Date: 06/06/2019 CLINICAL DATA:  Weakness. Shortness of breath. Altered mental status. History of dementia. EXAM: PORTABLE CHEST 1 VIEW COMPARISON:  Radiographs 05/05/2019 and 04/16/2019. CT 05/05/2019. FINDINGS: 1517 hours. The heart size and mediastinal contours are stable. There is aortic atherosclerosis with aortic valvular and mitral annular calcifications. Left-greater-than-right bibasilar pulmonary opacities have not significantly changed, likely postinflammatory scarring. No superimposed airspace disease, edema, significant pleural effusion or  pneumothorax. Telemetry leads overlie the chest. Stable mild thoracolumbar scoliosis. IMPRESSION: Stable chest with chronic bibasilar opacities, likely postinflammatory scarring. No acute cardiopulmonary process. Electronically Signed   By: Richardean Sale M.D.   On: 06/06/2019 15:35

## 2019-06-07 NOTE — Progress Notes (Signed)
Initial Nutrition Assessment  DOCUMENTATION CODES:   Not applicable  INTERVENTION:   Magic cup BID with meals, each supplement provides 290 kcal and 9 grams of protein  Change from GI Soft to Dysphagia 3 diet until evaluated by SLP  NUTRITION DIAGNOSIS:   Inadequate oral intake related to chronic illness as evidenced by per patient/family report.  GOAL:   Patient will meet greater than or equal to 90% of their needs  MONITOR:   PO intake, Labs, Weight trends, Supplement acceptance  REASON FOR ASSESSMENT:   Malnutrition Screening Tool    ASSESSMENT:   86 yo female admitted with toxic and metabolic encephalopathy dye to dehydration and UTI; pt with hx of COVID-19 pneumonia in January (resolved). PMH includes CKD II, advanced dementia, DM, HTN, hx of nonhemorrhagic stroke with left-sided hemiparesis. Pt resides at [facility] in memory care unit.  Pt is alert but confused with hx of advanced dementia.   Recorded po intake 50% of Breakfast this AM; pt eating lunch on visit today, feeding herself and had eaten 1/2 chicken salad sandwich so far.   No weight this admission. Per RN, current bed does not have a scale and pt refused to stand for standing weight today. Unsure if pt has lost weight recently. No weight loss noted per weight encounters  Pt currently with GI Soft diet order; noted SLP to see. Recommend until SLP evaluates, changing diet to Dysphagia III (easy to chew)  Labs: CBGs 62-133, potassium 3.0 (L), HgbA1c 8.1 Meds: D5-1/2 NS with KCl at 50 ml/hr, ss novolog, KCL, Vit B12   Diet Order:   Diet Order            DIET SOFT Room service appropriate? Yes; Fluid consistency: Thin  Diet effective now              EDUCATION NEEDS:   No education needs have been identified at this time  Skin:  Skin Assessment: Skin Integrity Issues: Skin Integrity Issues:: Stage II Stage II: buttock  Last BM:  no documented BM  Height:   Ht Readings from Last 1 Encounters:   04/14/19 5' (1.524 m)    Weight:   Wt Readings from Last 1 Encounters:  04/14/19 96.2 kg   BMI:  There is no height or weight on file to calculate BMI.  Estimated Nutritional Needs:   Kcal:  1500-1650 kcals  Protein:  75-85 g  Fluid:  >/= 1.5 L   Kerman Passey MS, RDN, LDN, CNSC RD Pager Number and Weekend/On-Call After Hours Pager Located in [place]

## 2019-06-07 NOTE — Progress Notes (Signed)
Hypoglycemic Event  CBG: 62  Treatment: 4 oz of juice  Symptoms: None  Follow-up CBG: JARW:1100 CBG Result:64  Possible Reasons for Event: Inadequate meal intake  CBG: 64  Treatment: D50 50 mL (25 gm)  Symptoms: None  Follow-up CBG: On coming shift will reassess

## 2019-06-07 NOTE — Evaluation (Signed)
Physical Therapy Evaluation Patient Details Name: Walter Min MRN: [id number] DOB: [birth year] Today's Date: 06/07/2019   History of Present Illness  86 yo female admitted to ED on 3/8 from [facility] for UTI, AMS. Pt with recent 04/2019 admission for COVID. PMH includes dementia, afib, CHF, COPD, HTN, neuropathy, CVA with L weakness.  Clinical Impression   Pt presents with generalized weakness, impaired cognition which per chart review is more pronounced vs baseline, difficulty performing mobility tasks, poor standing balance, and decreased activity tolerance. Pt to benefit from acute PT to address deficits. Pt required mod-max +2 for bed mobility and transfer to standing this session, pt with good initiation of mobility but unable to execute without significant physical assist. PT recommending SNF level of care post-acutely, given pt physical deficits and current assist levels required. PT to progress mobility as tolerated, and will continue to follow acutely.      Follow Up Recommendations SNF;Supervision/Assistance - 24 hour    Equipment Recommendations  Other (comment)(defer)    Recommendations for Other Services       Precautions / Restrictions Precautions Precautions: Fall Restrictions Weight Bearing Restrictions: No      Mobility  Bed Mobility Overal bed mobility: Needs Assistance Bed Mobility: Supine to Sit;Sit to Supine;Rolling Rolling: Mod assist   Supine to sit: Max assist;+2 for physical assistance;+2 for safety/equipment;HOB elevated Sit to supine: Max assist;+2 for physical assistance;+2 for safety/equipment;HOB elevated   General bed mobility comments: Max assist +2 for trunk and LE management, scooting to and from EOB. Mod assist for completion of roll onto side for bed pad adjustments. Pt with initiation of sitting up, rolling, and scooting but has difficulty executing.  Transfers Overall transfer level: Needs assistance Equipment used: Rolling walker (2  wheeled) Transfers: Sit to/from Stand Sit to Stand: Max assist;+2 physical assistance;+2 safety/equipment         General transfer comment: Max +2 for power up, steadying, upright posture. verbal cuing for upright posture, hand placement when rising. Pt limited by fatigue.  Ambulation/Gait             General Gait Details: unable to attempt today  Stairs            Wheelchair Mobility    Modified [facility] (Stroke Patients Only)       Balance Overall balance assessment: Needs assistance Sitting-balance support: No upper extremity supported;Feet supported Sitting balance-Leahy Scale: Fair Sitting balance - Comments: sat EOB x5 minutes no PT/OT support   Standing balance support: Bilateral upper extremity supported;During functional activity Standing balance-Leahy Scale: Zero Standing balance comment: max +2 to achieve and maintain standing                             Pertinent Vitals/Pain Pain Assessment: Faces Faces Pain Scale: Hurts little more Pain Location: R hip Pain Descriptors / Indicators: Discomfort;Grimacing;Guarding Pain Intervention(s): Limited activity within patient's tolerance;Monitored during session;Repositioned    Home Living Family/patient expects to be discharged to:: Assisted living               Home Equipment: Wheelchair - manual;Walker - 4 wheels Additional Comments: pt from [facility]    Prior Function Level of Independence: Needs assistance   Gait / Transfers Assistance Needed: Pt reports she was in the wheelchair and in the bed only PTA. When asked if pt receiving PT/OT, pt vague but eventually states "yes", unsure of accuracy.  ADL's / Homemaking Assistance Needed: Pt reports needing  assistance for dressing and bathing, reports she feeds herself.        Hand Dominance   Dominant Hand: Right    Extremity/Trunk Assessment   Upper Extremity Assessment Upper Extremity Assessment: Defer to OT evaluation     Lower Extremity Assessment Lower Extremity Assessment: Generalized weakness    Cervical / Trunk Assessment Cervical / Trunk Assessment: Kyphotic  Communication   Communication: No difficulties  Cognition Arousal/Alertness: Awake/alert Behavior During Therapy: WFL for tasks assessed/performed Overall Cognitive Status: History of cognitive impairments - at baseline Area of Impairment: Orientation;Following commands;Safety/judgement;Problem solving                 Orientation Level: Disoriented to;Place;Time;Situation     Following Commands: Follows one step commands consistently;Follows one step commands with increased time Safety/Judgement: Decreased awareness of safety;Decreased awareness of deficits   Problem Solving: Slow processing;Decreased initiation;Difficulty sequencing;Requires tactile cues;Requires verbal cues General Comments: Pt states she in in jail, pt oriented to hospital and reason for hospitalization. Pt follows commands with very increased time, and requires sequencing assist to mobilize.      General Comments      Exercises     Assessment/Plan    PT Assessment Patient needs continued PT services  PT Problem List Decreased strength;Decreased mobility;Decreased safety awareness;Decreased activity tolerance;Decreased cognition;Decreased balance;Pain;Decreased knowledge of use of DME;Obesity       PT Treatment Interventions DME instruction;Therapeutic activities;Gait training;Therapeutic exercise;Patient/family education;Balance training;Functional mobility training;Neuromuscular re-education    PT Goals (Current goals can be found in the Care Plan section)  Acute Rehab PT Goals Patient Stated Goal: none stated PT Goal Formulation: With patient Time For Goal Achievement: 06/21/19 Potential to Achieve Goals: Good    Frequency Min 2X/week   Barriers to discharge        Co-evaluation PT/OT/SLP Co-Evaluation/Treatment: Yes Reason for  Co-Treatment: Complexity of the patient's impairments (multi-system involvement);For patient/therapist safety;To address functional/ADL transfers PT goals addressed during session: Mobility/safety with mobility;Balance         AM-PAC PT "6 Clicks" Mobility  Outcome Measure Help needed turning from your back to your side while in a flat bed without using bedrails?: A Lot Help needed moving from lying on your back to sitting on the side of a flat bed without using bedrails?: Total Help needed moving to and from a bed to a chair (including a wheelchair)?: Total Help needed standing up from a chair using your arms (e.g., wheelchair or bedside chair)?: Total Help needed to walk in hospital room?: Total Help needed climbing 3-5 steps with a railing? : Total 6 Click Score: 7    End of Session Equipment Utilized During Treatment: Gait belt Activity Tolerance: Patient tolerated treatment well;Patient limited by fatigue Patient left: in bed;with call bell/phone within reach;with bed alarm set Nurse Communication: Mobility status PT Visit Diagnosis: Other abnormalities of gait and mobility (R26.89);Muscle weakness (generalized) (M62.81)    Time: 1540-0867 PT Time Calculation (min) (ACUTE ONLY): 19 min   Charges:   PT Evaluation $PT Eval Low Complexity: 1 Low         Arcola Freshour E, PT Acute Rehabilitation Services Pager [phone]  Office [phone]     Charo Philipp D Elonda Husky 06/07/2019, 11:46 AM

## 2019-06-07 NOTE — Evaluation (Signed)
Occupational Therapy Evaluation Patient Details Name: Kara Ortega MRN: [id number] DOB: 24-Oct-1929 Today's Date: 06/07/2019    History of Present Illness 86 yo female admitted to ED on 3/8 from [facility] for UTI, AMS. Pt with recent 04/2019 admission for COVID. PMH includes dementia, afib, CHF, COPD, HTN, neuropathy, CVA with L weakness.   Clinical Impression   Pt with decline in function and safety with ADLs and ADL mobility with impaired strength, balance, endurance and hx of cognitive impairments. Pt lives at an ALF but uncertain of PLOF as pt a poor historian. Pt reports that she uses a w/c and staff pushes her in w/c, assists her with ADLs, but that she can feed and groom herself. Pt currently requires mod  A with rolling in bed, max A +2 to sit EOB, min guard A with grooming and UB ADLs, max - total A with LB ADLs, total A with toileting and max A +2 for sit - stand from EOB to RW. Pt would benefit from acute OT services to address impairments to maximize level of function and safety    Follow Up Recommendations  SNF;Supervision/Assistance - 24 hour    Equipment Recommendations  Other (comment)(TBD at next venue of care)    Recommendations for Other Services       Precautions / Restrictions Precautions Precautions: Fall Restrictions Weight Bearing Restrictions: No      Mobility Bed Mobility Overal bed mobility: Needs Assistance Bed Mobility: Supine to Sit;Sit to Supine;Rolling Rolling: Mod assist   Supine to sit: Max assist;+2 for physical assistance;+2 for safety/equipment;HOB elevated Sit to supine: Max assist;+2 for physical assistance;+2 for safety/equipment;HOB elevated   General bed mobility comments: Max assist +2 for trunk and LE management, scooting to and from EOB. Mod assist for completion of roll onto side for bed pad adjustments. Pt with initiation of sitting up, rolling, and scooting but has difficulty executing.  Transfers Overall transfer level:  Needs assistance Equipment used: Rolling walker (2 wheeled) Transfers: Sit to/from Stand Sit to Stand: Max assist;+2 physical assistance;+2 safety/equipment         General transfer comment: Max +2 for power up, steadying, upright posture. verbal cuing for upright posture, hand placement when rising. Pt limited by fatigue.    Balance Overall balance assessment: Needs assistance Sitting-balance support: No upper extremity supported;Feet supported Sitting balance-Leahy Scale: Fair Sitting balance - Comments: sat EOB x5 minutes no PT/OT support   Standing balance support: Bilateral upper extremity supported;During functional activity Standing balance-Leahy Scale: Zero Standing balance comment: max +2 to achieve and maintain standing                           ADL either performed or assessed with clinical judgement   ADL Overall ADL's : Needs assistance/impaired Eating/Feeding: Set up;Independent;Sitting   Grooming: Wash/dry hands;Wash/dry face;Min guard;Sitting   Upper Body Bathing: Min guard;Sitting   Lower Body Bathing: Maximal assistance   Upper Body Dressing : Min guard;Sitting   Lower Body Dressing: Total assistance     Toilet Transfer Details (indicate cue type and reason): unable after sit - stand max A +2 x 2 trials from EOB Toileting- Clothing Manipulation and Hygiene: Total assistance;Bed level       Functional mobility during ADLs: Maximal assistance;+2 for physical assistance       Vision Baseline Vision/History: Wears glasses Patient Visual Report: No change from baseline       Perception     Praxis  Pertinent Vitals/Pain Pain Assessment: No/denies pain Faces Pain Scale: Hurts little more Pain Location: R hip Pain Descriptors / Indicators: Discomfort;Grimacing;Guarding Pain Intervention(s): Limited activity within patient's tolerance;Monitored during session;Repositioned     Hand Dominance Right   Extremity/Trunk Assessment  Upper Extremity Assessment Upper Extremity Assessment: Generalized weakness   Lower Extremity Assessment Lower Extremity Assessment: Defer to PT evaluation   Cervical / Trunk Assessment Cervical / Trunk Assessment: Kyphotic   Communication Communication Communication: No difficulties   Cognition Arousal/Alertness: Awake/alert Behavior During Therapy: WFL for tasks assessed/performed Overall Cognitive Status: History of cognitive impairments - at baseline Area of Impairment: Orientation;Following commands;Safety/judgement;Problem solving                 Orientation Level: Disoriented to;Place;Time;Situation     Following Commands: Follows one step commands consistently;Follows one step commands with increased time Safety/Judgement: Decreased awareness of safety;Decreased awareness of deficits   Problem Solving: Slow processing;Decreased initiation;Difficulty sequencing;Requires tactile cues;Requires verbal cues General Comments: Pt states she in in jail, pt oriented to hospital and reason for hospitalization. Pt follows commands with very increased time, and requires sequencing assist to mobilize.   General Comments       Exercises     Shoulder Instructions      Home Living Family/patient expects to be discharged to:: Assisted living                             Home Equipment: Wheelchair - manual;Walker - 4 wheels   Additional Comments: pt from [facility]      Prior Functioning/Environment Level of Independence: Needs assistance  Gait / Transfers Assistance Needed: Pt reports she was in the wheelchair and in the bed only PTA. When asked if pt receiving PT/OT, pt vague but eventually states "yes", unsure of accuracy. ADL's / Homemaking Assistance Needed: Pt reports needing assistance for dressing and bathing, reports she feeds herself.            OT Problem List: Decreased strength;Impaired balance (sitting and/or standing);Decreased  cognition;Decreased safety awareness;Decreased activity tolerance;Decreased knowledge of use of DME or AE      OT Treatment/Interventions: Self-care/ADL training;DME and/or AE instruction;Therapeutic activities;Balance training;Therapeutic exercise;Patient/family education    OT Goals(Current goals can be found in the care plan section) Acute Rehab OT Goals Patient Stated Goal: none stated OT Goal Formulation: With patient Time For Goal Achievement: 06/21/19 Potential to Achieve Goals: Good ADL Goals Pt Will Perform Grooming: with supervision;with set-up;sitting Pt Will Perform Upper Body Bathing: with supervision;with set-up;sitting Pt Will Perform Lower Body Bathing: with mod assist;sitting/lateral leans Pt Will Perform Upper Body Dressing: with supervision;with set-up;sitting Pt Will Transfer to Toilet: with max assist;with mod assist;with +2 assist;stand pivot transfer;bedside commode Additional ADL Goal #1: Pt will complete bed mobility mod - min A to sit EOB in prep for ADLs  OT Frequency: Min 2X/week   Barriers to D/C:            Co-evaluation PT/OT/SLP Co-Evaluation/Treatment: Yes Reason for Co-Treatment: Complexity of the patient's impairments (multi-system involvement);To address functional/ADL transfers;For patient/therapist safety PT goals addressed during session: Mobility/safety with mobility;Balance OT goals addressed during session: ADL's and self-care;Proper use of Adaptive equipment and DME      AM-PAC OT "6 Clicks" Daily Activity     Outcome Measure Help from another person eating meals?: None Help from another person taking care of personal grooming?: A Little Help from another person toileting, which includes using toliet, bedpan, or  urinal?: Total Help from another person bathing (including washing, rinsing, drying)?: A Lot Help from another person to put on and taking off regular upper body clothing?: A Little Help from another person to put on and taking  off regular lower body clothing?: Total 6 Click Score: 14   End of Session Equipment Utilized During Treatment: Gait belt;Rolling walker  Activity Tolerance: Patient limited by fatigue Patient left: in bed;with call bell/phone within reach;with bed alarm set  OT Visit Diagnosis: Unsteadiness on feet (R26.81);Other abnormalities of gait and mobility (R26.89);Muscle weakness (generalized) (M62.81);Other symptoms and signs involving cognitive function                Time: 6734-1937 OT Time Calculation (min): 18 min Charges:  OT General Charges $OT Visit: 1 Visit OT Evaluation $OT Eval Moderate Complexity: 1 Mod    Britt Bottom 06/07/2019, 1:12 PM

## 2019-06-08 ENCOUNTER — Other Ambulatory Visit (HOSPITAL_COMMUNITY): Payer: Medicare Other

## 2019-06-08 DIAGNOSIS — R7881 Bacteremia: Secondary | ICD-10-CM

## 2019-06-08 DIAGNOSIS — B952 Enterococcus as the cause of diseases classified elsewhere: Secondary | ICD-10-CM

## 2019-06-08 DIAGNOSIS — Z7189 Other specified counseling: Secondary | ICD-10-CM

## 2019-06-08 DIAGNOSIS — G309 Alzheimer's disease, unspecified: Secondary | ICD-10-CM

## 2019-06-08 DIAGNOSIS — Z515 Encounter for palliative care: Secondary | ICD-10-CM

## 2019-06-08 LAB — CBC WITH DIFFERENTIAL/PLATELET
Abs Immature Granulocytes: 0.12 10*3/uL — ABNORMAL HIGH (ref 0.00–0.07)
Basophils Absolute: 0 10*3/uL (ref 0.0–0.1)
Basophils Relative: 0 %
Eosinophils Absolute: 0.2 10*3/uL (ref 0.0–0.5)
Eosinophils Relative: 3 %
HCT: 43.9 % (ref 36.0–46.0)
Hemoglobin: 14.1 g/dL (ref 12.0–15.0)
Immature Granulocytes: 2 %
Lymphocytes Relative: 20 %
Lymphs Abs: 1.6 10*3/uL (ref 0.7–4.0)
MCH: 29 pg (ref 26.0–34.0)
MCHC: 32.1 g/dL (ref 30.0–36.0)
MCV: 90.3 fL (ref 80.0–100.0)
Monocytes Absolute: 0.8 10*3/uL (ref 0.1–1.0)
Monocytes Relative: 10 %
Neutro Abs: 5.5 10*3/uL (ref 1.7–7.7)
Neutrophils Relative %: 65 %
Platelets: 196 10*3/uL (ref 150–400)
RBC: 4.86 MIL/uL (ref 3.87–5.11)
RDW: 19.2 % — ABNORMAL HIGH (ref 11.5–15.5)
WBC: 8.3 10*3/uL (ref 4.0–10.5)
nRBC: 0 % (ref 0.0–0.2)

## 2019-06-08 LAB — BLOOD CULTURE ID PANEL (REFLEXED)

## 2019-06-08 LAB — COMPREHENSIVE METABOLIC PANEL
ALT: 23 U/L (ref 0–44)
AST: 28 U/L (ref 15–41)
Albumin: 2.7 g/dL — ABNORMAL LOW (ref 3.5–5.0)
Alkaline Phosphatase: 59 U/L (ref 38–126)
Anion gap: 12 (ref 5–15)
BUN: 9 mg/dL (ref 8–23)
CO2: 18 mmol/L — ABNORMAL LOW (ref 22–32)
Calcium: 8.9 mg/dL (ref 8.9–10.3)
Chloride: 110 mmol/L (ref 98–111)
Creatinine, Ser: 0.78 mg/dL (ref 0.44–1.00)
GFR calc Af Amer: >60 mL/min (ref >60)
GFR calc non Af Amer: >60 mL/min (ref >60)
Glucose, Bld: 108 mg/dL — ABNORMAL HIGH (ref 70–99)
Potassium: 3.9 mmol/L (ref 3.5–5.1)
Sodium: 140 mmol/L (ref 135–145)
Total Bilirubin: 0.7 mg/dL (ref 0.3–1.2)
Total Protein: 5.6 g/dL — ABNORMAL LOW (ref 6.5–8.1)

## 2019-06-08 LAB — GLUCOSE, CAPILLARY
Glucose-Capillary: 99 mg/dL (ref 70–99)
Glucose-Capillary: 142 mg/dL — ABNORMAL HIGH (ref 70–99)
Glucose-Capillary: 134 mg/dL — ABNORMAL HIGH (ref 70–99)
Glucose-Capillary: 141 mg/dL — ABNORMAL HIGH (ref 70–99)

## 2019-06-08 LAB — MAGNESIUM: Magnesium: 1.4 mg/dL — ABNORMAL LOW (ref 1.7–2.4)

## 2019-06-08 LAB — MRSA PCR SCREENING: MRSA by PCR: POSITIVE — AB

## 2019-06-08 LAB — BRAIN NATRIURETIC PEPTIDE: B Natriuretic Peptide: 293.2 pg/mL — ABNORMAL HIGH (ref 0.0–100.0)

## 2019-06-08 LAB — PROCALCITONIN: Procalcitonin: <0.1 ng/mL

## 2019-06-08 MED ORDER — SODIUM CHLORIDE 0.9 % IV SOLN
2.0000 g | Freq: Once | INTRAVENOUS | Status: AC
  Administered 2019-06-08: 2 g via INTRAVENOUS
  Filled 2019-06-08: qty 2000

## 2019-06-08 MED ORDER — CHLORHEXIDINE GLUCONATE CLOTH 2 % EX PADS
6.0000 | MEDICATED_PAD | Freq: Every day | CUTANEOUS | Status: AC
  Administered 2019-06-08 – 2019-06-12 (×5): 6 via TOPICAL

## 2019-06-08 MED ORDER — MUPIROCIN 2 % EX OINT
1.0000 "application " | TOPICAL_OINTMENT | Freq: Two times a day (BID) | CUTANEOUS | Status: AC
  Administered 2019-06-08 – 2019-06-12 (×10): 1 via NASAL
  Filled 2019-06-08 (×3): qty 22

## 2019-06-08 MED ORDER — SODIUM CHLORIDE 0.9 % IV SOLN
2.0000 g | Freq: Four times a day (QID) | INTRAVENOUS | Status: DC
  Administered 2019-06-08 – 2019-06-14 (×24): 2 g via INTRAVENOUS
  Filled 2019-06-08: qty 2
  Filled 2019-06-08 (×2): qty 2000
  Filled 2019-06-08: qty 2
  Filled 2019-06-08: qty 2000
  Filled 2019-06-08 (×6): qty 2
  Filled 2019-06-08 (×4): qty 2000
  Filled 2019-06-08: qty 2
  Filled 2019-06-08: qty 2000
  Filled 2019-06-08: qty 2
  Filled 2019-06-08: qty 2000
  Filled 2019-06-08: qty 2
  Filled 2019-06-08: qty 2000
  Filled 2019-06-08: qty 2
  Filled 2019-06-08: qty 2000
  Filled 2019-06-08: qty 2
  Filled 2019-06-08: qty 2000
  Filled 2019-06-08 (×4): qty 2
  Filled 2019-06-08: qty 2000

## 2019-06-08 NOTE — Discharge Instructions (Signed)

## 2019-06-08 NOTE — Plan of Care (Signed)
  Problem: Activity: Goal: Risk for activity intolerance will decrease Outcome: Progressing   Problem: Nutrition: Goal: Adequate nutrition will be maintained Outcome: Progressing   

## 2019-06-08 NOTE — Consult Note (Signed)
Consultation Note Date: 06/08/2019   Patient Name: Kara Ortega  DOB: March 08, 1930  MRN: [id number]  Age / Sex: 86 y.o., female  PCP: Carol Ada, MD Referring Physician: Aline August, MD  Reason for Consultation: Establishing goals of care  HPI/Patient Profile: 86 y.o. female  with past medical history of recent COVID hospitalization dc'd 1/21 to the [facility], Alzheimer's dementia, CKD3, OSA on CPAP, admitted on 06/06/2019 with altered mental status and findings of dehydration, UTI, and bacteremia- etiology unclear, TEE pending for possible endocarditis.  Palliative medicine consulted for [place].   Clinical Assessment and Goals of Care: Evaluate patient- she was in bed- repeated my questions without direct answers. Looked frustrated at one point with being unable to say what she wanted.  Lunch tray in front of her was untouched. She declined to eat with my assistance. She has a DNR form on her chart that was signed by Dr. Marlou Porch in March 2020.  I called her son- Kara Ortega - he is familiar with Palliative medicine as we saw patient on her last admission.  Our discussion was brief as Kara Ortega was working and on a [employer]. I inquired about DNR status- Kara Ortega states that his Mom should be full code for now- "that was her choice when she could decide for herself".  Plan made for more thorough discussion tomorrow at 11am.   Primary Decision Maker NEXT OF KIN- son Rick [facility] full scope care, full code -Will further discuss code status and GOC tomorrow with patient's son- note per 06/07/2018 note from Dr. Kingsley Plan patient indicated her preference for DNR- will explore this tomorrow    Code Status/Advance Care Planning:  Full code  Palliative Prophylaxis:   Delirium Protocol  Prognosis:    Unable to determine  Discharge Planning: [facility] for rehab with  Palliative care service follow-up  Primary Diagnoses: Present on Admission: . Alzheimer's dementia ([place]) . AKI (acute kidney injury) ([place]) . UTI (urinary tract infection)   I have reviewed the medical record, interviewed the patient and family, and examined the patient. The following aspects are pertinent.  Past Medical History:  Diagnosis Date  . A-fib ([place])   . Abnormal heart rhythms   . Alzheimer disease ([place])   . Anginal pain ([place])   . Back pain   . CHF (congestive heart failure) ([place])   . Chronic kidney disease    STAGE 3   . COPD (chronic obstructive pulmonary disease) ([place])   . Depression   . Diabetes ([place])    insulin dependent  . Hypertension   . Leg pain   . Memory loss   . Neuropathy   . Shortness of breath   . Sleep apnea    uses bipap X12 years.  . Stroke [facility])    LEFT SIDE RESIDUAL   Social History   Socioeconomic History  . Marital status: Single    Spouse name: Not on file  . Number of children: 3  . Years of education: Not  on file  . Highest education level: Not on file  Occupational History  . Occupation: retired  Tobacco Use  . Smoking status: Former Smoker    Packs/day: 1.50    Years: 18.00    Pack years: 27.00    Types: Cigarettes    Quit date: 03/31/1968    Years since quitting: 51.2  . Smokeless tobacco: Never Used  Substance and Sexual Activity  . Alcohol use: No    Alcohol/week: 0.0 standard drinks  . Drug use: No  . Sexual activity: Never  Other Topics Concern  . Not on file  Social History Narrative   Not sure of hands   Assisted living   Patient drinks coffe   Social Determinants of Health   Financial Resource Strain:   . Difficulty of Paying Living Expenses: Not on file  Food Insecurity:   . Worried About Charity fundraiser in the Last Year: Not on file  . Ran Out of Food in the Last Year: Not on file  Transportation Needs:   . Lack of Transportation (Medical): Not on file  . Lack of Transportation (Non-Medical):  Not on file  Physical Activity:   . Days of Exercise per Week: Not on file  . Minutes of Exercise per Session: Not on file  Stress:   . Feeling of Stress : Not on file  Social Connections:   . Frequency of Communication with Friends and Family: Not on file  . Frequency of Social Gatherings with Friends and Family: Not on file  . Attends Religious Services: Not on file  . Active Member of Clubs or Organizations: Not on file  . Attends Archivist Meetings: Not on file  . Marital Status: Not on file   Family History  Problem Relation Age of Onset  . Dementia Maternal Grandmother   . Heart disease Mother   . Heart disease Sister   . Alzheimer's disease Sister   . Dementia Maternal Aunt   . Mental illness Other    Scheduled Meds: . allopurinol  200 mg Oral Daily  . apixaban  5 mg Oral BID  . atorvastatin  10 mg Oral QHS  . Chlorhexidine Gluconate Cloth  6 each Topical Q0600  . famotidine  20 mg Oral BID  . insulin aspart  0-9 Units Subcutaneous TID WC  . isosorbide mononitrate  60 mg Oral Daily  . mupirocin ointment  1 application Nasal BID  . topiramate  25 mg Oral QHS  . vitamin B-12  1,000 mcg Oral Daily   Continuous Infusions: . ampicillin (OMNIPEN) IV     PRN Meds:.acetaminophen, ALPRAZolam, nitroGLYCERIN, traMADol Medications Prior to Admission:  Prior to Admission medications   Medication Sig Start Date End Date Taking? Authorizing Provider  allopurinol (ZYLOPRIM) 100 MG tablet Take 200 mg by mouth daily.    Yes [provider]  apixaban (ELIQUIS) 5 MG TABS tablet Take 1 tablet (5 mg total) by mouth 2 (two) times daily. 02/19/15  Yes Isaiah Serge, NP  Ascorbic Acid (VITAMIN C) 1000 MG tablet Take 1,000 mg by mouth daily.   Yes [provider]  atorvastatin (LIPITOR) 10 MG tablet Take 10 mg by mouth at bedtime.    Yes [provider]  B-D UF III MINI PEN NEEDLES 31G X 5 MM MISC U UTD TO INJECT INSULIN 02/25/15  Yes [provider]  Cholecalciferol (HM VITAMIN D3) 4000 UNITS CAPS Take 1,000 Units by mouth daily.    Yes [provider]  famotidine (PEPCID) 10 MG tablet Take 20 mg by mouth 2 (two) times daily.   Yes [provider]  furosemide (LASIX) 20 MG tablet Take 20 mg by mouth daily.    Yes [provider]  insulin NPH-regular Human (70-30) 100 UNIT/ML injection Inject 16 Units into the skin daily.   Yes [provider]  isosorbide mononitrate (IMDUR) 60 MG 24 hr tablet Take 1 tablet (60 mg total) by mouth daily. 04/27/17  Yes Isaiah Serge, NP  Liraglutide (VICTOZA) 18 MG/3ML SOPN Inject 1.2 mg into the skin daily.    Yes [provider]  nitroGLYCERIN (NITROSTAT) 0.4 MG SL tablet Place 1 tablet (0.4 mg total) under the tongue every 5 (five) minutes as needed for chest pain. 05/19/14  Yes Jerline Pain, MD  pregabalin (LYRICA) 100 MG capsule Take 1 capsule (100 mg total) by mouth 2 (two) times daily. 07/25/16  Yes Pollina, Gwenyth Allegra, MD  spironolactone (ALDACTONE) 25 MG tablet Take 25 mg by mouth 2 (two) times daily.    Yes [provider]  topiramate (TOPAMAX) 25 MG tablet Take 1 tablet (25 mg total) by mouth at bedtime. 11/24/18  Yes Jaffe, Adam R, DO  TOUJEO SOLOSTAR 300 UNIT/ML SOPN Inject 80 mg into the skin at bedtime.  05/22/14  Yes [provider]  vitamin B-12 (CYANOCOBALAMIN) 1000 MCG tablet Take 1,000 mcg by mouth daily.   Yes [provider]  zinc gluconate 50 MG tablet Take 50 mg by mouth daily.   Yes [provider]  ALPRAZolam (XANAX) 0.25 MG tablet Take 1 tablet (0.25 mg total) by mouth every 12 (twelve) hours as needed for anxiety or sleep. Patient not taking: Reported on 06/06/2019 04/21/19   Caren Griffins, MD  Rivastigmine 13.3 MG/24HR PT24 Place 1 patch (13.3 mg total) onto the skin daily. Patient not taking: Reported on 06/06/2019 04/27/15   Pieter Partridge, DO  tiZANidine (ZANAFLEX) 2 MG tablet Take 1  tablet (2 mg total) by mouth every 6 (six) hours as needed for muscle spasms. Patient not taking: Reported on 06/06/2019 04/21/19   Caren Griffins, MD  traMADol (ULTRAM) 50 MG tablet Take 0.5 tablets (25 mg total) by mouth every 8 (eight) hours as needed for moderate pain. Patient not taking: Reported on 06/06/2019 04/21/19   Caren Griffins, MD   Allergies  Allergen Reactions  . Avandia [Rosiglitazone] Other (See Comments)    edema  . Benazepril Other (See Comments)    unknown  . Dilaudid [Hydromorphone Hcl] Other (See Comments)    "does not tolerate strong pain medications" per pt  . Fosamax [Alendronate Sodium] Nausea And Vomiting  . Talwin [Pentazocine] Nausea And Vomiting  . Tetanus Toxoids Other (See Comments)    "allergic to something in the tetanus injection" per patient  . Zyrtec [Cetirizine] Other (See Comments)    unknown   Review of Systems  Unable to perform ROS: Dementia    Physical Exam Vitals and nursing note reviewed.  HENT:     Head: Normocephalic and atraumatic.  Cardiovascular:     Rate and Rhythm: Normal rate.  Skin:    General: Skin is warm and dry.     Coloration: Skin is pale.  Neurological:     Mental Status: She is alert. She is disoriented.     Vital Signs: BP 114/76 (BP Location: Right Arm)   Pulse 94   Temp 98.5 F (36.9 C) (Oral)   Resp 18  Ht 5\' 3"  (1.6 m)   Wt 80.7 kg   SpO2 93%   BMI 31.53 kg/m  Pain Scale: 0-10   Pain Score: Asleep   SpO2: SpO2: 93 % O2 Device:SpO2: 93 % O2 Flow Rate: .   IO: Intake/output summary:   Intake/Output Summary (Last 24 hours) at 06/08/2019 1431 Last data filed at 06/08/2019 1300 Gross per 24 hour  Intake 894.17 ml  Output 700 ml  Net 194.17 ml    LBM: Last BM Date: (no value) Baseline Weight: Weight: 80.7 kg Most recent weight: Weight: 80.7 kg     Palliative Assessment/Data: PPS: 40%     Thank you for this consult. Palliative medicine will continue to follow and assist as needed.    Time In: 1340 Time Out: 1440 Time Total: 60 mins Greater than 50%  of this time was spent counseling and coordinating care related to the above assessment and plan.  Signed by: Mariana Kaufman, AGNP-C Palliative Medicine    Please contact Palliative Medicine Team phone at [phone] for questions and concerns.  For individual provider: See Shea Evans

## 2019-06-08 NOTE — Progress Notes (Signed)
[place] - BLOOD CULTURE IDENTIFICATION (BCID)  Kara Ortega is an 86 y.o. female who presented to [facility] on 06/06/2019 with a chief complaint of altered mental status, generalized weakness  Assessment: 3/4 bottles GPC in chains, BCID Enterococcus species, no vanc resistance detected  Name of physician (or Provider) Contacted: Dr. Starla Link  Current antibiotics: Ceftriaxone 2 gm IV q24h  Changes to prescribed antibiotics recommended: Narrow to ampicillin 2 gm. No wt charted, will f/u wt and enter maintenance dose.  Results for orders placed or performed during the hospital encounter of 06/06/19  Blood Culture ID Panel (Reflexed) (Collected: 06/07/2019 10:28 AM)  Result Value Ref Range   Enterococcus species DETECTED (A) NOT DETECTED   Vancomycin resistance NOT DETECTED NOT DETECTED   Listeria monocytogenes NOT DETECTED NOT DETECTED   Staphylococcus species NOT DETECTED NOT DETECTED   Staphylococcus aureus (BCID) NOT DETECTED NOT DETECTED   Streptococcus species NOT DETECTED NOT DETECTED   Streptococcus agalactiae NOT DETECTED NOT DETECTED   Streptococcus pneumoniae NOT DETECTED NOT DETECTED   Streptococcus pyogenes NOT DETECTED NOT DETECTED   Acinetobacter baumannii NOT DETECTED NOT DETECTED   Enterobacteriaceae species NOT DETECTED NOT DETECTED   Enterobacter cloacae complex NOT DETECTED NOT DETECTED   Escherichia coli NOT DETECTED NOT DETECTED   Klebsiella oxytoca NOT DETECTED NOT DETECTED   Klebsiella pneumoniae NOT DETECTED NOT DETECTED   Proteus species NOT DETECTED NOT DETECTED   Serratia marcescens NOT DETECTED NOT DETECTED   Haemophilus influenzae NOT DETECTED NOT DETECTED   Neisseria meningitidis NOT DETECTED NOT DETECTED   Pseudomonas aeruginosa NOT DETECTED NOT DETECTED   Candida albicans NOT DETECTED NOT DETECTED   Candida glabrata NOT DETECTED NOT DETECTED   Candida krusei NOT DETECTED NOT DETECTED   Candida  parapsilosis NOT DETECTED NOT DETECTED   Candida tropicalis NOT DETECTED NOT DETECTED    Berenice Bouton, PharmD PGY1 Pharmacy Resident  Please check AMION for all [place] phone numbers After 10:00 PM, call [place] [phone]  06/08/2019  9:19 AM

## 2019-06-08 NOTE — Consult Note (Signed)
[name]    Date of Admission:  06/06/2019     Total days of antibiotics 3               Reason for Consult: Enterococcus Species    Referring Provider: CHAMP/Auto-consult Primary Care Provider: Carol Ada, MD   ASSESSMENT:  [name] is an 86 y/o female with advanced dementia with enterococcus species bacteremia now in 2/8 bottles. Source of infection is unclear as her urinary symptoms described were likely secondary to dehydration with cultures showing proteus mirabilis although enterococcus is a frequent cause of UTI. Will change antibiotics to ampicillin and plan for repeat blood cultures. Check TTE to rule out endocarditis.  Unfortunately she is not able to provide any details regarding her condition. Disposition appears to be SNF.   PLAN:  1. Continue ampicillin.  2. Repeat blood cultures.  3. TTE for evaluation of endocarditis.   Active Problems:   Alzheimer's dementia ([place])   AKI (acute kidney injury) ([place])   Acute lower UTI   Lactic acidosis   UTI (urinary tract infection)   Bacteremia due to Enterococcus   . allopurinol  200 mg Oral Daily  . apixaban  5 mg Oral BID  . atorvastatin  10 mg Oral QHS  . Chlorhexidine Gluconate Cloth  6 each Topical Q0600  . famotidine  20 mg Oral BID  . insulin aspart  0-9 Units Subcutaneous TID WC  . isosorbide mononitrate  60 mg Oral Daily  . mupirocin ointment  1 application Nasal BID  . topiramate  25 mg Oral QHS  . vitamin B-12  1,000 mcg Oral Daily     HPI: Kara Ortega is a 86 y.o. female with previous medical history of atrial fibrillation and dementia admitted to the hospital from her extended care facility for generalized weakness, decreased responsiveness, and dark/cloudy urine.  [name] was afebrile and tachycardic in the ED. Lab work significant for lactic acidosis of 3.4, creatinine 1.32, WBC count of 9.2, and UA with large leukocytes and WBC of 21-50. Chest x-ray with  chronic bibasilar opacities. Urine culture with 70,000 colonies per/ml with Proteus mirabilis. Initial blood cultures drawn on 06/06/19 with 1/4 bottles with gram positive cocci and repeat cultures from 06/07/19 with 1/4 cultures positive for Enterococcus species. MRSA PCR positive. Started on ceftriaxone for UTI.   [name] has been afebrile since admission and currently on Day 2 of antimicrobial therapy with ceftriaxone. Per hospitalist note, mentation was improving with IV fluids. [name] is not able to contribute to her current situation as she is disoriented and speaks in word salad at times.   Review of Systems: Review of Systems  Unable to perform ROS: Dementia    Past Medical History:  Diagnosis Date  . A-fib ([place])   . Abnormal heart rhythms   . Alzheimer disease ([place])   . Anginal pain ([place])   . Back pain   . CHF (congestive heart failure) ([place])   . Chronic kidney disease    STAGE 3   . COPD (chronic obstructive pulmonary disease) ([place])   . Depression   . Diabetes ([place])    insulin dependent  . Hypertension   . Leg pain   . Memory loss   . Neuropathy   . Shortness of breath   . Sleep apnea    uses bipap X12 years.  . Stroke [facility])    LEFT SIDE RESIDUAL    Social History   Tobacco  Use  . Smoking status: Former Smoker    Packs/day: 1.50    Years: 18.00    Pack years: 27.00    Types: Cigarettes    Quit date: 03/31/1968    Years since quitting: 51.2  . Smokeless tobacco: Never Used  Substance Use Topics  . Alcohol use: No    Alcohol/week: 0.0 standard drinks  . Drug use: No    Family History  Problem Relation Age of Onset  . Dementia Maternal Grandmother   . Heart disease Mother   . Heart disease Sister   . Alzheimer's disease Sister   . Dementia Maternal Aunt   . Mental illness Other     Allergies  Allergen Reactions  . Avandia [Rosiglitazone] Other (See Comments)    edema  . Benazepril Other (See Comments)    unknown  . Dilaudid [Hydromorphone  Hcl] Other (See Comments)    "does not tolerate strong pain medications" per pt  . Fosamax [Alendronate Sodium] Nausea And Vomiting  . Talwin [Pentazocine] Nausea And Vomiting  . Tetanus Toxoids Other (See Comments)    "allergic to something in the tetanus injection" per patient  . Zyrtec [Cetirizine] Other (See Comments)    unknown    OBJECTIVE: Blood pressure 114/76, pulse 94, temperature 98.5 F (36.9 C), temperature source Oral, resp. rate 18, height 5\' 3"  (1.6 m), weight 80.7 kg, SpO2 93 %.  Physical Exam Constitutional:      General: She is not in acute distress.    Appearance: She is well-developed.  Cardiovascular:     Rate and Rhythm: Normal rate and regular rhythm.     Heart sounds: Murmur present.  Pulmonary:     Effort: Pulmonary effort is normal.     Breath sounds: Normal breath sounds.  Skin:    General: Skin is warm and dry.  Neurological:     Mental Status: She is alert. She is disoriented.  Psychiatric:        Cognition and Memory: Cognition is impaired. Memory is impaired.     Lab Results Lab Results  Component Value Date   WBC 8.3 06/08/2019   HGB 14.1 06/08/2019   HCT 43.9 06/08/2019   MCV 90.3 06/08/2019   PLT 196 06/08/2019    Lab Results  Component Value Date   CREATININE 0.78 06/08/2019   BUN 9 06/08/2019   NA 140 06/08/2019   K 3.9 06/08/2019   CL 110 06/08/2019   CO2 18 (L) 06/08/2019    Lab Results  Component Value Date   ALT 23 06/08/2019   AST 28 06/08/2019   ALKPHOS 59 06/08/2019   BILITOT 0.7 06/08/2019     Microbiology: Recent Results (from the past 240 hour(s))  Urine Culture     Status: Abnormal (Preliminary result)   Collection Time: 06/06/19  3:03 PM   Specimen: Urine, Catheterized  Result Value Ref Range Status   Specimen Description URINE, CATHETERIZED  Final   Special Requests ADDED 2253  Final   Culture (A)  Final    70,000 COLONIES/mL PROTEUS MIRABILIS SUSCEPTIBILITIES TO FOLLOW Performed at [facility], [place] [address], [place], [place] [address]    Report Status PENDING  Incomplete  Blood Culture (routine x 2)     Status: Abnormal (Preliminary result)   Collection Time: 06/06/19  3:20 PM   Specimen: BLOOD  Result Value Ref Range Status   Specimen Description BLOOD RIGHT ANTECUBITAL  Final   Special Requests   Final  BOTTLES DRAWN AEROBIC AND ANAEROBIC Blood Culture results may not be optimal due to an inadequate volume of blood received in culture bottles   Culture  Setup Time   Final    GRAM POSITIVE COCCI IN CHAINS ANAEROBIC BOTTLE ONLY CRITICAL RESULT CALLED TO, READ BACK BY AND VERIFIED WITH: Welcome [id number] FCP    Culture (A)  Final    ENTEROCOCCUS FAECALIS SUSCEPTIBILITIES TO FOLLOW Performed at [facility], [place] [address], [place], [place] [address]    Report Status PENDING  Incomplete  Blood Culture (routine x 2)     Status: None (Preliminary result)   Collection Time: 06/06/19  3:30 PM   Specimen: BLOOD  Result Value Ref Range Status   Specimen Description BLOOD LEFT ANTECUBITAL  Final   Special Requests   Final    BOTTLES DRAWN AEROBIC AND ANAEROBIC Blood Culture results may not be optimal due to an inadequate volume of blood received in culture bottles   Culture   Final    NO GROWTH 2 DAYS Performed at [facility], [place] [address], [place], [place] [address]    Report Status PENDING  Incomplete  SARS CORONAVIRUS 2 (TAT 6-24 HRS) Nasopharyngeal Nasopharyngeal Swab     Status: None   Collection Time: 06/06/19  5:55 PM   Specimen: Nasopharyngeal Swab  Result Value Ref Range Status   SARS Coronavirus 2 NEGATIVE NEGATIVE Final    Comment: (NOTE) SARS-CoV-2 target nucleic acids are NOT DETECTED. The SARS-CoV-2 RNA is generally detectable in upper and lower respiratory specimens during the acute phase of infection. Negative results do not preclude SARS-CoV-2 infection, do not rule out co-infections with other pathogens, and  should not be used as the sole basis for treatment or other patient management decisions. Negative results must be combined with clinical observations, patient history, and epidemiological information. The expected result is Negative. Fact Sheet for Patients: SugarRoll.be Fact Sheet for Healthcare Providers: https://www.woods-mathews.com/ This test is not yet approved or cleared by the [place] FDA and  has been authorized for detection and/or diagnosis of SARS-CoV-2 by FDA under an Emergency Use Authorization (EUA). This EUA will remain  in effect (meaning this test can be used) for the duration of the COVID-19 declaration under Section 56 4(b)(1) of the Act, 21 U.S.C. section 360bbb-3(b)(1), unless the authorization is terminated or revoked sooner. Performed at [facility], [place] [address], [place], [place] [address]   Culture, blood (routine x 2)     Status: None (Preliminary result)   Collection Time: 06/07/19 10:27 AM   Specimen: BLOOD  Result Value Ref Range Status   Specimen Description BLOOD RIGHT ANTECUBITAL  Final   Special Requests   Final    BOTTLES DRAWN AEROBIC AND ANAEROBIC Blood Culture results may not be optimal due to an inadequate volume of blood received in culture bottles   Culture   Final    NO GROWTH < 24 HOURS Performed at [facility], [place] [address], [place], [name] [address]    Report Status PENDING  Incomplete  Culture, blood (routine x 2)     Status: None (Preliminary result)   Collection Time: 06/07/19 10:28 AM   Specimen: BLOOD RIGHT ARM  Result Value Ref Range Status   Specimen Description BLOOD RIGHT ARM  Final   Special Requests   Final    BOTTLES DRAWN AEROBIC ONLY Blood Culture results may not be optimal due to an inadequate volume of  blood received in culture bottles   Culture  Setup Time   Final    GRAM POSITIVE COCCI IN CHAINS AEROBIC BOTTLE ONLY CRITICAL RESULT CALLED TO, READ  BACK BY AND VERIFIED WITH: PHARMD J Tenkiller [id number] AT 826 BY CM    Culture   Final    CULTURE REINCUBATED FOR BETTER GROWTH Performed at [facility], [place] [address], [place], [place] [address]    Report Status PENDING  Incomplete  Blood Culture ID Panel (Reflexed)     Status: Abnormal   Collection Time: 06/07/19 10:28 AM  Result Value Ref Range Status   Enterococcus species DETECTED (A) NOT DETECTED Final    Comment: CRITICAL RESULT CALLED TO, READ BACK BY AND VERIFIED WITH: PHARMD J STEENWKY [id number] AT 825 AM BY CM    Vancomycin resistance NOT DETECTED NOT DETECTED Final   Listeria monocytogenes NOT DETECTED NOT DETECTED Final   Staphylococcus species NOT DETECTED NOT DETECTED Final   Staphylococcus aureus (BCID) NOT DETECTED NOT DETECTED Final   Streptococcus species NOT DETECTED NOT DETECTED Final   Streptococcus agalactiae NOT DETECTED NOT DETECTED Final   Streptococcus pneumoniae NOT DETECTED NOT DETECTED Final   Streptococcus pyogenes NOT DETECTED NOT DETECTED Final   Acinetobacter baumannii NOT DETECTED NOT DETECTED Final   Enterobacteriaceae species NOT DETECTED NOT DETECTED Final   Enterobacter cloacae complex NOT DETECTED NOT DETECTED Final   Escherichia coli NOT DETECTED NOT DETECTED Final   Klebsiella oxytoca NOT DETECTED NOT DETECTED Final   Klebsiella pneumoniae NOT DETECTED NOT DETECTED Final   Proteus species NOT DETECTED NOT DETECTED Final   Serratia marcescens NOT DETECTED NOT DETECTED Final   Haemophilus influenzae NOT DETECTED NOT DETECTED Final   Neisseria meningitidis NOT DETECTED NOT DETECTED Final   Pseudomonas aeruginosa NOT DETECTED NOT DETECTED Final   Candida albicans NOT DETECTED NOT DETECTED Final   Candida glabrata NOT DETECTED NOT DETECTED Final   Candida krusei NOT DETECTED NOT DETECTED Final   Candida parapsilosis NOT DETECTED NOT DETECTED Final   Candida tropicalis NOT DETECTED NOT DETECTED Final    Comment: Performed at [facility], [place]. [address] [place], [place] [address]  MRSA PCR Screening     Status: Abnormal   Collection Time: 06/08/19 12:11 AM   Specimen: Nasal Mucosa; Nasopharyngeal  Result Value Ref Range Status   MRSA by PCR POSITIVE (A) NEGATIVE Final    Comment:        The GeneXpert MRSA Assay (FDA approved for NASAL specimens only), is one component of a comprehensive MRSA colonization surveillance program. It is not intended to diagnose MRSA infection nor to guide or monitor treatment for MRSA infections. RESULT CALLED TO, READ BACK BY AND VERIFIED WITH: MAHMOUD,Z RN 06/08/2019 AT 4097 SKEEN,P Performed at [facility], [place] [name], [place], [place] [address]      Terri Piedra, [facility] [phone] Pager  06/08/2019  1:45 PM

## 2019-06-08 NOTE — Progress Notes (Signed)
RT talked to patient about being on a CPAP and she seemed very confused and stated she did not wear one. RT will continue to monitor as needed throughout the night.

## 2019-06-08 NOTE — Progress Notes (Signed)
Patient ID: Kara Ortega, female   DOB: [birth year], 86 y.o.   MRN: [id number]  PROGRESS NOTE    Kara Ortega  JQB:[id number] DOB: 27-Jul-1929 DOA: 06/06/2019 PCP: Carol Ada, MD   Brief Narrative:  86 year old female with history of permanent A. fib, chronic kidney disease stage II, advanced dementia, diabetes mellitus type 2 on insulin, hypertension, nonhemorrhagic stroke with left-sided hemiparesis, Covid infection in January 2021 presented from memory care nursing facility with hallucinations, generalized weakness, decreased responsiveness and dark/cloudy urine and admitted for dehydration, AKI, toxic encephalopathy due to UTI and started on IV antibiotics and fluids.  Assessment & Plan:   Enterococcus bacteremia: Present on admission UTI -BCID showing Enterococcus species.  Follow sensitivities.  Rocephin will be changed to ampicillin.  ID will be auto consulted. -Most likely secondary to UTI.   Acute metabolic encephalopathy: Most likely secondary to dehydration and UTI History of dementia  -Monitor mental status.  Mental status probably back to her baseline. -Fall precautions  Dehydration Acute kidney injury -From above and poor oral intake.  Treated with IV fluids.  Off IV fluids. -Encourage oral intake. -Acute kidney injury has resolved  Hypomagnesemia -Replace.  Repeat a.m. labs.  Permanent A. Fib -Mali VASC 2 Score of > 4  -Rate controlled.  Continue Eliquis.  History of nonhemorrhagic stroke with mild left-sided weakness -Stable.  Continue Eliquis along with statin  Hypertension  -Blood pressure on the lower side but stable.  Continue Imdur.  Diuretics on hold.  Dyslipidemia -Continue statin  History of gout  -continue allopurinol  Chronic diastolic CHF with EF of 40% -Currently compensated.  Diuretics on hold.  Resume in a.m.  Strict input and output.  Daily weights.  Diabetes mellitus type 2 -A1c 8.1.  Continue CBGs with SSI.  Carb modified  diet  Generalized deconditioning -PT recommends SNF placement.  Overall prognosis is guarded to poor.  Will request palliative care consultation for goals of care discussion.  Currently full code.   DVT prophylaxis: Eliquis Code Status: Full Family Communication: Spoke to son/Richard [place] on phone on 06/08/2019 Disposition Plan: Currently bacteremic and requiring IV antibiotics.  Will need ID input regarding length of antibiotic treatment.  Subsequently discharge to rehab facility in 1 to 2 days if remains stable.  Consultants: None  Procedures: None  Antimicrobials:  Anti-infectives (From admission, onward)   Start     Dose/Rate Route Frequency Ordered Stop   06/08/19 0845  ampicillin (OMNIPEN) 2 g in sodium chloride 0.9 % 100 mL IVPB     2 g 300 mL/hr over 20 Minutes Intravenous  Once 06/08/19 0835 06/08/19 1032   06/07/19 1630  cefTRIAXone (ROCEPHIN) 2 g in sodium chloride 0.9 % 100 mL IVPB  Status:  Discontinued     2 g 200 mL/hr over 30 Minutes Intravenous Every 24 hours 06/07/19 0953 06/08/19 0834   06/06/19 1630  cefTRIAXone (ROCEPHIN) 1 g in sodium chloride 0.9 % 100 mL IVPB  Status:  Discontinued     1 g 200 mL/hr over 30 Minutes Intravenous  Once 06/06/19 1619 06/06/19 1626   06/06/19 1630  cefTRIAXone (ROCEPHIN) 1 g in sodium chloride 0.9 % 100 mL IVPB  Status:  Discontinued     1 g 200 mL/hr over 30 Minutes Intravenous Every 24 hours 06/06/19 1620 06/07/19 0953       Subjective: Patient seen and examined at bedside.  She is awake but confused.  Poor historian.  No overnight fever, vomiting reported by nursing staff.  Objective: Vitals:  06/07/19 1651 06/07/19 2115 06/08/19 0457 06/08/19 0917  BP: (!) 104/47 (!) 124/59 (!) 105/52 114/76  Pulse: 78 78 85 94  Resp: 20 18 18 18   Temp: (!) 97.5 F (36.4 C) 97.6 F (36.4 C) 98 F (36.7 C) 98.5 F (36.9 C)  TempSrc: Oral Oral  Oral  SpO2: 98% 97% 91% 93%  Weight:    80.7 kg  Height:    5\' 3"  (1.6 m)     Intake/Output Summary (Last 24 hours) at 06/08/2019 1117 Last data filed at 06/08/2019 1012 Gross per 24 hour  Intake 1027.03 ml  Output 700 ml  Net 327.03 ml   Filed Weights   06/08/19 0917  Weight: 80.7 kg    Examination:  General exam: No acute distress.  Elderly female.  Awake but confused.  Poor historian.   Respiratory system: Bilateral decreased breath sounds at bases Cardiovascular system: S1 & S2 heard, Rate controlled Gastrointestinal system: Abdomen is nondistended, soft and nontender. Normal bowel sounds heard. Extremities: No cyanosis, clubbing; trace lower extremity edema Central nervous system: Awake but confused. old left-sided weakness present.  Moving extremities Skin: No rashes, lesions or ulcers Psychiatry: Could not be assessed because of mental status.    Data Reviewed: I have personally reviewed following labs and imaging studies  CBC: Recent Labs  Lab 06/06/19 1410 06/07/19 0509 06/08/19 0448  WBC 9.2 9.4 8.3  NEUTROABS 6.5  --  5.5  HGB 15.3* 13.2 14.1  HCT 47.1* 40.5 43.9  MCV 90.4 91.0 90.3  PLT 242 207 841   Basic Metabolic Panel: Recent Labs  Lab 06/06/19 1410 06/07/19 0509 06/08/19 0448  NA 133* 140 140  K 4.2 3.0* 3.9  CL 96* 105 110  CO2 23 21* 18*  GLUCOSE 203* 65* 108*  BUN 21 14 9   CREATININE 1.32* 0.79 0.78  CALCIUM 9.2 8.3* 8.9  MG  --   --  1.4*   GFR: Estimated Creatinine Clearance: 49.8 mL/min (by C-G formula based on SCr of 0.78 mg/dL). Liver Function Tests: Recent Labs  Lab 06/06/19 1410 06/08/19 0448  AST 26 28  ALT 21 23  ALKPHOS 63 59  BILITOT 0.9 0.7  PROT 6.0* 5.6*  ALBUMIN 3.2* 2.7*   No results for input(s): LIPASE, AMYLASE in the last 168 hours. No results for input(s): AMMONIA in the last 168 hours. Coagulation Profile: No results for input(s): INR, PROTIME in the last 168 hours. Cardiac Enzymes: No results for input(s): CKTOTAL, CKMB, CKMBINDEX, TROPONINI in the last 168 hours. BNP  (last 3 results) No results for input(s): PROBNP in the last 8760 hours. HbA1C: Recent Labs    06/06/19 1800  HGBA1C 8.1*   CBG: Recent Labs  Lab 06/07/19 0747 06/07/19 1127 06/07/19 1657 06/07/19 2110 06/08/19 0724  GLUCAP 133* 145* 155* 179* 99   Lipid Profile: No results for input(s): CHOL, HDL, LDLCALC, TRIG, CHOLHDL, LDLDIRECT in the last 72 hours. Thyroid Function Tests: No results for input(s): TSH, T4TOTAL, FREET4, T3FREE, THYROIDAB in the last 72 hours. Anemia Panel: No results for input(s): VITAMINB12, FOLATE, FERRITIN, TIBC, IRON, RETICCTPCT in the last 72 hours. Sepsis Labs: Recent Labs  Lab 06/06/19 1410 06/06/19 1645 06/07/19 1027 06/08/19 0448  PROCALCITON  --   --  <0.10 <0.10  LATICACIDVEN 3.4* 2.8*  --   --     Recent Results (from the past 240 hour(s))  Urine Culture     Status: Abnormal (Preliminary result)   Collection Time: 06/06/19  3:03 PM  Specimen: Urine, Catheterized  Result Value Ref Range Status   Specimen Description URINE, CATHETERIZED  Final   Special Requests ADDED 2253  Final   Culture (A)  Final    70,000 COLONIES/mL PROTEUS MIRABILIS SUSCEPTIBILITIES TO FOLLOW Performed at [facility], [address], [place], [place] [address]    Report Status PENDING  Incomplete  Blood Culture (routine x 2)     Status: None (Preliminary result)   Collection Time: 06/06/19  3:20 PM   Specimen: BLOOD  Result Value Ref Range Status   Specimen Description BLOOD RIGHT ANTECUBITAL  Final   Special Requests   Final    BOTTLES DRAWN AEROBIC AND ANAEROBIC Blood Culture results may not be optimal due to an inadequate volume of blood received in culture bottles   Culture  Setup Time   Final    GRAM POSITIVE COCCI IN CHAINS ANAEROBIC BOTTLE ONLY CRITICAL RESULT CALLED TO, READ BACK BY AND VERIFIED WITH: Lorita Officer  [place] [id number] [place] Performed at [facility], [place] [address] [place], [place] [address]    Culture GRAM POSITIVE  COCCI  Final   Report Status PENDING  Incomplete  Blood Culture (routine x 2)     Status: None (Preliminary result)   Collection Time: 06/06/19  3:30 PM   Specimen: BLOOD  Result Value Ref Range Status   Specimen Description BLOOD LEFT ANTECUBITAL  Final   Special Requests   Final    BOTTLES DRAWN AEROBIC AND ANAEROBIC Blood Culture results may not be optimal due to an inadequate volume of blood received in culture bottles   Culture   Final    NO GROWTH 2 DAYS Performed at [facility], [place] [address] [place], [place] [address]    Report Status PENDING  Incomplete  SARS CORONAVIRUS 2 (TAT 6-24 HRS) Nasopharyngeal Nasopharyngeal Swab     Status: None   Collection Time: 06/06/19  5:55 PM   Specimen: Nasopharyngeal Swab  Result Value Ref Range Status   SARS Coronavirus 2 NEGATIVE NEGATIVE Final    Comment: (NOTE) SARS-CoV-2 target nucleic acids are NOT DETECTED. The SARS-CoV-2 RNA is generally detectable in upper and lower respiratory specimens during the acute phase of infection. Negative results do not preclude SARS-CoV-2 infection, do not rule out co-infections with other pathogens, and should not be used as the sole basis for treatment or other patient management decisions. Negative results must be combined with clinical observations, patient history, and epidemiological information. The expected result is Negative. Fact Sheet for Patients: SugarRoll.be Fact Sheet for Healthcare Providers: https://www.woods-mathews.com/ This test is not yet approved or cleared by the [place] FDA and  has been authorized for detection and/or diagnosis of SARS-CoV-2 by FDA under an Emergency Use Authorization (EUA). This EUA will remain  in effect (meaning this test can be used) for the duration of the COVID-19 declaration under Section 56 4(b)(1) of the Act, 21 U.S.C. section 360bbb-3(b)(1), unless the authorization is terminated or revoked  sooner. Performed at [facility], [place] [address], [place], [place] [address]   Culture, blood (routine x 2)     Status: None (Preliminary result)   Collection Time: 06/07/19 10:27 AM   Specimen: BLOOD  Result Value Ref Range Status   Specimen Description BLOOD RIGHT ANTECUBITAL  Final   Special Requests   Final    BOTTLES DRAWN AEROBIC AND ANAEROBIC Blood Culture results may not be optimal due to an inadequate volume of blood received  in culture bottles   Culture   Final    NO GROWTH < 24 HOURS Performed at [facility], [place] [address] [place], [place] [address]    Report Status PENDING  Incomplete  Culture, blood (routine x 2)     Status: None (Preliminary result)   Collection Time: 06/07/19 10:28 AM   Specimen: BLOOD RIGHT ARM  Result Value Ref Range Status   Specimen Description BLOOD RIGHT ARM  Final   Special Requests   Final    BOTTLES DRAWN AEROBIC ONLY Blood Culture results may not be optimal due to an inadequate volume of blood received in culture bottles   Culture  Setup Time   Final    GRAM POSITIVE COCCI IN CHAINS AEROBIC BOTTLE ONLY CRITICAL RESULT CALLED TO, READ BACK BY AND VERIFIED WITH: PHARMD J [place] [id number] AT 826 BY CM    Culture   Final    CULTURE REINCUBATED FOR BETTER GROWTH Performed at [facility], [place] [address] [place], [place] [address]    Report Status PENDING  Incomplete  Blood Culture ID Panel (Reflexed)     Status: Abnormal   Collection Time: 06/07/19 10:28 AM  Result Value Ref Range Status   Enterococcus species DETECTED (A) NOT DETECTED Final    Comment: CRITICAL RESULT CALLED TO, READ BACK BY AND VERIFIED WITH: PHARMD J STEENWKY [id number] AT 825 AM BY CM    Vancomycin resistance NOT DETECTED NOT DETECTED Final   Listeria monocytogenes NOT DETECTED NOT DETECTED Final   Staphylococcus species NOT DETECTED NOT DETECTED Final   Staphylococcus aureus (BCID) NOT DETECTED NOT DETECTED Final   Streptococcus species NOT  DETECTED NOT DETECTED Final   Streptococcus agalactiae NOT DETECTED NOT DETECTED Final   Streptococcus pneumoniae NOT DETECTED NOT DETECTED Final   Streptococcus pyogenes NOT DETECTED NOT DETECTED Final   Acinetobacter baumannii NOT DETECTED NOT DETECTED Final   Enterobacteriaceae species NOT DETECTED NOT DETECTED Final   Enterobacter cloacae complex NOT DETECTED NOT DETECTED Final   Escherichia coli NOT DETECTED NOT DETECTED Final   Klebsiella oxytoca NOT DETECTED NOT DETECTED Final   Klebsiella pneumoniae NOT DETECTED NOT DETECTED Final   Proteus species NOT DETECTED NOT DETECTED Final   Serratia marcescens NOT DETECTED NOT DETECTED Final   Haemophilus influenzae NOT DETECTED NOT DETECTED Final   Neisseria meningitidis NOT DETECTED NOT DETECTED Final   Pseudomonas aeruginosa NOT DETECTED NOT DETECTED Final   Candida albicans NOT DETECTED NOT DETECTED Final   Candida glabrata NOT DETECTED NOT DETECTED Final   Candida krusei NOT DETECTED NOT DETECTED Final   Candida parapsilosis NOT DETECTED NOT DETECTED Final   Candida tropicalis NOT DETECTED NOT DETECTED Final    Comment: Performed at [facility], [place]. [address] [place], [place] [address]  MRSA PCR Screening     Status: Abnormal   Collection Time: 06/08/19 12:11 AM   Specimen: Nasal Mucosa; Nasopharyngeal  Result Value Ref Range Status   MRSA by PCR POSITIVE (A) NEGATIVE Final    Comment:        The GeneXpert MRSA Assay (FDA approved for NASAL specimens only), is one component of a comprehensive MRSA colonization surveillance program. It is not intended to diagnose MRSA infection nor to guide or monitor treatment for MRSA infections. RESULT CALLED TO, READ BACK BY AND VERIFIED WITH: MAHMOUD,Z RN 06/08/2019 AT 9622 SKEEN,P Performed at [facility], [place] [address], [place], [place] [address]  Radiology Studies: DG Chest Port 1 View  Result Date: 06/06/2019 CLINICAL DATA:  Weakness. Shortness of  breath. Altered mental status. History of dementia. EXAM: PORTABLE CHEST 1 VIEW COMPARISON:  Radiographs 05/05/2019 and 04/16/2019. CT 05/05/2019. FINDINGS: 1517 hours. The heart size and mediastinal contours are stable. There is aortic atherosclerosis with aortic valvular and mitral annular calcifications. Left-greater-than-right bibasilar pulmonary opacities have not significantly changed, likely postinflammatory scarring. No superimposed airspace disease, edema, significant pleural effusion or pneumothorax. Telemetry leads overlie the chest. Stable mild thoracolumbar scoliosis. IMPRESSION: Stable chest with chronic bibasilar opacities, likely postinflammatory scarring. No acute cardiopulmonary process. Electronically Signed   By: Richardean Sale M.D.   On: 06/06/2019 15:35        Scheduled Meds: . allopurinol  200 mg Oral Daily  . apixaban  5 mg Oral BID  . atorvastatin  10 mg Oral QHS  . Chlorhexidine Gluconate Cloth  6 each Topical Q0600  . famotidine  20 mg Oral BID  . insulin aspart  0-9 Units Subcutaneous TID WC  . isosorbide mononitrate  60 mg Oral Daily  . mupirocin ointment  1 application Nasal BID  . topiramate  25 mg Oral QHS  . vitamin B-12  1,000 mcg Oral Daily   Continuous Infusions:        Aline August, MD [facility] 06/08/2019, 11:17 AM

## 2019-06-09 ENCOUNTER — Inpatient Hospital Stay (HOSPITAL_COMMUNITY): Payer: Medicare Other

## 2019-06-09 DIAGNOSIS — R4182 Altered mental status, unspecified: Secondary | ICD-10-CM

## 2019-06-09 DIAGNOSIS — N3000 Acute cystitis without hematuria: Secondary | ICD-10-CM

## 2019-06-09 DIAGNOSIS — I351 Nonrheumatic aortic (valve) insufficiency: Secondary | ICD-10-CM

## 2019-06-09 DIAGNOSIS — I340 Nonrheumatic mitral (valve) insufficiency: Secondary | ICD-10-CM

## 2019-06-09 DIAGNOSIS — N390 Urinary tract infection, site not specified: Secondary | ICD-10-CM

## 2019-06-09 DIAGNOSIS — I350 Nonrheumatic aortic (valve) stenosis: Secondary | ICD-10-CM

## 2019-06-09 LAB — CBC WITH DIFFERENTIAL/PLATELET
Abs Immature Granulocytes: 0.11 10*3/uL — ABNORMAL HIGH (ref 0.00–0.07)
Basophils Absolute: 0 10*3/uL (ref 0.0–0.1)
Basophils Relative: 0 %
Eosinophils Absolute: 0.2 10*3/uL (ref 0.0–0.5)
Eosinophils Relative: 2 %
HCT: 46.8 % — ABNORMAL HIGH (ref 36.0–46.0)
Hemoglobin: 15.2 g/dL — ABNORMAL HIGH (ref 12.0–15.0)
Immature Granulocytes: 1 %
Lymphocytes Relative: 19 %
Lymphs Abs: 1.8 10*3/uL (ref 0.7–4.0)
MCH: 29.6 pg (ref 26.0–34.0)
MCHC: 32.5 g/dL (ref 30.0–36.0)
MCV: 91.1 fL (ref 80.0–100.0)
Monocytes Absolute: 0.9 10*3/uL (ref 0.1–1.0)
Monocytes Relative: 10 %
Neutro Abs: 6.2 10*3/uL (ref 1.7–7.7)
Neutrophils Relative %: 68 %
Platelets: 245 10*3/uL (ref 150–400)
RBC: 5.14 MIL/uL — ABNORMAL HIGH (ref 3.87–5.11)
RDW: 19.6 % — ABNORMAL HIGH (ref 11.5–15.5)
WBC: 9.3 10*3/uL (ref 4.0–10.5)
nRBC: 0.2 % (ref 0.0–0.2)

## 2019-06-09 LAB — ECHOCARDIOGRAM COMPLETE
Height: 63 in
Weight: 2848 oz

## 2019-06-09 LAB — PROCALCITONIN: Procalcitonin: <0.1 ng/mL

## 2019-06-09 LAB — COMPREHENSIVE METABOLIC PANEL
ALT: 16 U/L (ref 0–44)
AST: 51 U/L — ABNORMAL HIGH (ref 15–41)
Albumin: 2.9 g/dL — ABNORMAL LOW (ref 3.5–5.0)
Alkaline Phosphatase: 59 U/L (ref 38–126)
Anion gap: 14 (ref 5–15)
BUN: 8 mg/dL (ref 8–23)
CO2: 18 mmol/L — ABNORMAL LOW (ref 22–32)
Calcium: 9.4 mg/dL (ref 8.9–10.3)
Chloride: 108 mmol/L (ref 98–111)
Creatinine, Ser: 0.81 mg/dL (ref 0.44–1.00)
GFR calc Af Amer: >60 mL/min (ref >60)
GFR calc non Af Amer: >60 mL/min (ref >60)
Glucose, Bld: 133 mg/dL — ABNORMAL HIGH (ref 70–99)
Potassium: 4.8 mmol/L (ref 3.5–5.1)
Sodium: 140 mmol/L (ref 135–145)
Total Bilirubin: 2.1 mg/dL — ABNORMAL HIGH (ref 0.3–1.2)
Total Protein: 5.8 g/dL — ABNORMAL LOW (ref 6.5–8.1)

## 2019-06-09 LAB — CULTURE, BLOOD (ROUTINE X 2)

## 2019-06-09 LAB — URINE CULTURE: Culture: 70000 — AB

## 2019-06-09 LAB — GLUCOSE, CAPILLARY
Glucose-Capillary: 117 mg/dL — ABNORMAL HIGH (ref 70–99)
Glucose-Capillary: 157 mg/dL — ABNORMAL HIGH (ref 70–99)

## 2019-06-09 LAB — MAGNESIUM: Magnesium: 1.5 mg/dL — ABNORMAL LOW (ref 1.7–2.4)

## 2019-06-09 MED ORDER — MAGNESIUM SULFATE 2 GM/50ML IV SOLN
2.0000 g | Freq: Once | INTRAVENOUS | Status: AC
  Administered 2019-06-09: 2 g via INTRAVENOUS
  Filled 2019-06-09: qty 50

## 2019-06-09 NOTE — Progress Notes (Signed)
Patient ID: Kara Ortega, female   DOB: 1929-12-27, 86 y.o.   MRN: [id number]  PROGRESS NOTE    Sherial Ebrahim  DTO:[id number] DOB: 1929-06-07 DOA: 06/06/2019 PCP: Carol Ada, MD   Brief Narrative:  86 year old female with history of permanent A. fib, chronic kidney disease stage II, advanced dementia, diabetes mellitus type 2 on insulin, hypertension, nonhemorrhagic stroke with left-sided hemiparesis, Covid infection in January 2021 presented from memory care nursing facility with hallucinations, generalized weakness, decreased responsiveness and dark/cloudy urine and admitted for dehydration, AKI, toxic encephalopathy due to UTI and started on IV antibiotics and fluids.  Assessment & Plan:   Enterococcus faecalis bacteremia: Present on admission UTI -BCID showing Enterococcus faecalis species.  Follow sensitivities.  Rocephin has been changed to ampicillin.  ID evaluation appreciated.  Follow repeat blood cultures.  2D echo pending. -Most likely secondary to UTI.  Urine cultures growing Proteus mirabilis though.   Acute metabolic encephalopathy: Most likely secondary to dehydration and UTI History of dementia  -Monitor mental status.  Mental status probably back to her baseline. -Fall precautions  Dehydration Acute kidney injury -From above and poor oral intake.  Treated with IV fluids.  Off IV fluids. -Encourage oral intake. -Acute kidney injury has resolved  Hypomagnesemia -Replace.  Repeat a.m. labs.  Permanent A. Fib -Mali VASC 2 Score of > 4  -Rate controlled.  Continue Eliquis.  History of nonhemorrhagic stroke with mild left-sided weakness -Stable.  Continue Eliquis along with statin  Hypertension  -Blood pressure on the lower side but stable.  Continue Imdur.  Diuretics on hold.  Dyslipidemia -Continue statin  History of gout  -continue allopurinol  Chronic diastolic CHF with EF of 98% -Currently compensated.  Diuretics on hold.  Strict input and output.   Daily weights.  Diabetes mellitus type 2 -A1c 8.1.  Continue CBGs with SSI.  Carb modified diet  Generalized deconditioning -PT recommends SNF placement.  Social worker following.  Overall prognosis is guarded to poor.  Palliative care has been consulted for goals of care discussion.  Currently full code.   DVT prophylaxis: Eliquis Code Status: Full Family Communication: Spoke to son/Richard Gurabo on phone on 06/08/2019 Disposition Plan: Currently bacteremic and requiring IV antibiotics.  Will need ID input regarding length of antibiotic treatment.  Repeat blood cultures/2D echo pending.  Subsequently discharge to rehab facility in 1 to 2 days if remains stable.  Consultants: ID/palliative care  Procedures: None  Antimicrobials:  Anti-infectives (From admission, onward)   Start     Dose/Rate Route Frequency Ordered Stop   06/08/19 1600  ampicillin (OMNIPEN) 2 g in sodium chloride 0.9 % 100 mL IVPB     2 g 300 mL/hr over 20 Minutes Intravenous Every 6 hours 06/08/19 1144     06/08/19 0845  ampicillin (OMNIPEN) 2 g in sodium chloride 0.9 % 100 mL IVPB     2 g 300 mL/hr over 20 Minutes Intravenous  Once 06/08/19 0835 06/08/19 1032   06/07/19 1630  cefTRIAXone (ROCEPHIN) 2 g in sodium chloride 0.9 % 100 mL IVPB  Status:  Discontinued     2 g 200 mL/hr over 30 Minutes Intravenous Every 24 hours 06/07/19 0953 06/08/19 0834   06/06/19 1630  cefTRIAXone (ROCEPHIN) 1 g in sodium chloride 0.9 % 100 mL IVPB  Status:  Discontinued     1 g 200 mL/hr over 30 Minutes Intravenous  Once 06/06/19 1619 06/06/19 1626   06/06/19 1630  cefTRIAXone (ROCEPHIN) 1 g in sodium chloride 0.9 % 100  mL IVPB  Status:  Discontinued     1 g 200 mL/hr over 30 Minutes Intravenous Every 24 hours 06/06/19 1620 06/07/19 0953       Subjective: Patient seen and examined at bedside.  Poor historian.  Confused.  No fever, worsening shortness of breath, vomiting reported by nursing staff.  Objective: Vitals:    06/08/19 0917 06/08/19 1647 06/08/19 2052 06/09/19 0500  BP: 114/76 123/75 105/72 110/70  Pulse: 94 92 78 77  Resp: 18 18 14 16   Temp: 98.5 F (36.9 C) 97.6 F (36.4 C) 98.5 F (36.9 C) 98.3 F (36.8 C)  TempSrc: Oral Oral  Oral  SpO2: 93% 96% 93% 95%  Weight: 80.7 kg     Height: 5\' 3"  (1.6 m)       Intake/Output Summary (Last 24 hours) at 06/09/2019 0743 Last data filed at 06/09/2019 0554 Gross per 24 hour  Intake 680.56 ml  Output 2000 ml  Net -1319.44 ml   Filed Weights   06/08/19 0917  Weight: 80.7 kg    Examination:  General exam: No distress.  Elderly female.  Confused.  Poor historian.   Respiratory system: Bilateral decreased breath sounds at bases, no wheezing Cardiovascular system: Rate controlled, S1-S2 heard Gastrointestinal system: Abdomen is nondistended, soft and nontender. Normal bowel sounds heard. Extremities: No clubbing; lower extremity edema present   Data Reviewed: I have personally reviewed following labs and imaging studies  CBC: Recent Labs  Lab 06/06/19 1410 06/07/19 0509 06/08/19 0448  WBC 9.2 9.4 8.3  NEUTROABS 6.5  --  5.5  HGB 15.3* 13.2 14.1  HCT 47.1* 40.5 43.9  MCV 90.4 91.0 90.3  PLT 242 207 811   Basic Metabolic Panel: Recent Labs  Lab 06/06/19 1410 06/07/19 0509 06/08/19 0448 06/09/19 0427  NA 133* 140 140 140  K 4.2 3.0* 3.9 4.8  CL 96* 105 110 108  CO2 23 21* 18* 18*  GLUCOSE 203* 65* 108* 133*  BUN 21 14 9 8   CREATININE 1.32* 0.79 0.78 0.81  CALCIUM 9.2 8.3* 8.9 9.4  MG  --   --  1.4* 1.5*   GFR: Estimated Creatinine Clearance: 49.2 mL/min (by C-G formula based on SCr of 0.81 mg/dL). Liver Function Tests: Recent Labs  Lab 06/06/19 1410 06/08/19 0448 06/09/19 0427  AST 26 28 51*  ALT 21 23 16   ALKPHOS 63 59 59  BILITOT 0.9 0.7 2.1*  PROT 6.0* 5.6* 5.8*  ALBUMIN 3.2* 2.7* 2.9*   No results for input(s): LIPASE, AMYLASE in the last 168 hours. No results for input(s): AMMONIA in the last 168  hours. Coagulation Profile: No results for input(s): INR, PROTIME in the last 168 hours. Cardiac Enzymes: No results for input(s): CKTOTAL, CKMB, CKMBINDEX, TROPONINI in the last 168 hours. BNP (last 3 results) No results for input(s): PROBNP in the last 8760 hours. HbA1C: Recent Labs    06/06/19 1800  HGBA1C 8.1*   CBG: Recent Labs  Lab 06/08/19 0724 06/08/19 1124 06/08/19 1648 06/08/19 2051 06/09/19 0714  GLUCAP 99 142* 134* 141* 117*   Lipid Profile: No results for input(s): CHOL, HDL, LDLCALC, TRIG, CHOLHDL, LDLDIRECT in the last 72 hours. Thyroid Function Tests: No results for input(s): TSH, T4TOTAL, FREET4, T3FREE, THYROIDAB in the last 72 hours. Anemia Panel: No results for input(s): VITAMINB12, FOLATE, FERRITIN, TIBC, IRON, RETICCTPCT in the last 72 hours. Sepsis Labs: Recent Labs  Lab 06/06/19 1410 06/06/19 1645 06/07/19 1027 06/08/19 0448  PROCALCITON  --   --  <  0.10 <0.10  LATICACIDVEN 3.4* 2.8*  --   --     Recent Results (from the past 240 hour(s))  Urine Culture     Status: Abnormal   Collection Time: 06/06/19  3:03 PM   Specimen: Urine, Catheterized  Result Value Ref Range Status   Specimen Description URINE, CATHETERIZED  Final   Special Requests   Final    ADDED 2253 Performed at [facility], [place] [address] [place], [place] [address]    Culture 70,000 COLONIES/mL PROTEUS MIRABILIS (A)  Final   Report Status 06/09/2019 FINAL  Final   Organism ID, Bacteria PROTEUS MIRABILIS (A)  Final      Susceptibility   Proteus mirabilis - MIC*    AMPICILLIN <=2 SENSITIVE Sensitive     CEFAZOLIN 8 SENSITIVE Sensitive     CEFTRIAXONE <=0.25 SENSITIVE Sensitive     CIPROFLOXACIN >=4 RESISTANT Resistant     GENTAMICIN <=1 SENSITIVE Sensitive     IMIPENEM 2 SENSITIVE Sensitive     NITROFURANTOIN 128 RESISTANT Resistant     TRIMETH/SULFA >=320 RESISTANT Resistant     AMPICILLIN/SULBACTAM <=2 SENSITIVE Sensitive     PIP/TAZO <=4 SENSITIVE Sensitive      * 70,000 COLONIES/mL PROTEUS MIRABILIS  Blood Culture (routine x 2)     Status: Abnormal (Preliminary result)   Collection Time: 06/06/19  3:20 PM   Specimen: BLOOD  Result Value Ref Range Status   Specimen Description BLOOD RIGHT ANTECUBITAL  Final   Special Requests   Final    BOTTLES DRAWN AEROBIC AND ANAEROBIC Blood Culture results may not be optimal due to an inadequate volume of blood received in culture bottles   Culture  Setup Time   Final    GRAM POSITIVE COCCI IN CHAINS ANAEROBIC BOTTLE ONLY CRITICAL RESULT CALLED TO, READ BACK BY AND VERIFIED WITH: [place] [id number] FCP    Culture (A)  Final    ENTEROCOCCUS FAECALIS SUSCEPTIBILITIES TO FOLLOW Performed at [facility], [address], [place], [place] [address]    Report Status PENDING  Incomplete  Blood Culture (routine x 2)     Status: None (Preliminary result)   Collection Time: 06/06/19  3:30 PM   Specimen: BLOOD  Result Value Ref Range Status   Specimen Description BLOOD LEFT ANTECUBITAL  Final   Special Requests   Final    BOTTLES DRAWN AEROBIC AND ANAEROBIC Blood Culture results may not be optimal due to an inadequate volume of blood received in culture bottles   Culture   Final    NO GROWTH 2 DAYS Performed at [facility], [place] [address] [place], [place] [address]    Report Status PENDING  Incomplete  SARS CORONAVIRUS 2 (TAT 6-24 HRS) Nasopharyngeal Nasopharyngeal Swab     Status: None   Collection Time: 06/06/19  5:55 PM   Specimen: Nasopharyngeal Swab  Result Value Ref Range Status   SARS Coronavirus 2 NEGATIVE NEGATIVE Final    Comment: (NOTE) SARS-CoV-2 target nucleic acids are NOT DETECTED. The SARS-CoV-2 RNA is generally detectable in upper and lower respiratory specimens during the acute phase of infection. Negative results do not preclude SARS-CoV-2 infection, do not rule out co-infections with other pathogens, and should not be used as the sole basis for treatment  or other patient management decisions. Negative results must be combined with clinical observations, patient history, and epidemiological information. The expected result is Negative. Fact Sheet for Patients: SugarRoll.be Fact Sheet for Healthcare  Providers: https://www.woods-mathews.com/ This test is not yet approved or cleared by the [place] and  has been authorized for detection and/or diagnosis of SARS-CoV-2 by FDA under an Emergency Use Authorization (EUA). This EUA will remain  in effect (meaning this test can be used) for the duration of the COVID-19 declaration under Section 56 4(b)(1) of the Act, 21 U.S.C. section 360bbb-3(b)(1), unless the authorization is terminated or revoked sooner. Performed at [facility], [place] [address] [place], [place] [address]   Culture, blood (routine x 2)     Status: None (Preliminary result)   Collection Time: 06/07/19 10:27 AM   Specimen: BLOOD  Result Value Ref Range Status   Specimen Description BLOOD RIGHT ANTECUBITAL  Final   Special Requests   Final    BOTTLES DRAWN AEROBIC AND ANAEROBIC Blood Culture results may not be optimal due to an inadequate volume of blood received in culture bottles   Culture   Final    NO GROWTH < 24 HOURS Performed at [facility], [place] [address] [place], [place] [address]    Report Status PENDING  Incomplete  Culture, blood (routine x 2)     Status: None (Preliminary result)   Collection Time: 06/07/19 10:28 AM   Specimen: BLOOD RIGHT ARM  Result Value Ref Range Status   Specimen Description BLOOD RIGHT ARM  Final   Special Requests   Final    BOTTLES DRAWN AEROBIC ONLY Blood Culture results may not be optimal due to an inadequate volume of blood received in culture bottles   Culture  Setup Time   Final    GRAM POSITIVE COCCI IN CHAINS AEROBIC BOTTLE ONLY CRITICAL RESULT CALLED TO, READ BACK BY AND VERIFIED WITH: PHARMD J Killeen [id number] AT  826 BY CM    Culture   Final    CULTURE REINCUBATED FOR BETTER GROWTH Performed at [facility], [place] [address], [place], [place] [address]    Report Status PENDING  Incomplete  Blood Culture ID Panel (Reflexed)     Status: Abnormal   Collection Time: 06/07/19 10:28 AM  Result Value Ref Range Status   Enterococcus species DETECTED (A) NOT DETECTED Final    Comment: CRITICAL RESULT CALLED TO, READ BACK BY AND VERIFIED WITH: PHARMD J STEENWKY [id number] AT 825 AM BY CM    Vancomycin resistance NOT DETECTED NOT DETECTED Final   Listeria monocytogenes NOT DETECTED NOT DETECTED Final   Staphylococcus species NOT DETECTED NOT DETECTED Final   Staphylococcus aureus (BCID) NOT DETECTED NOT DETECTED Final   Streptococcus species NOT DETECTED NOT DETECTED Final   Streptococcus agalactiae NOT DETECTED NOT DETECTED Final   Streptococcus pneumoniae NOT DETECTED NOT DETECTED Final   Streptococcus pyogenes NOT DETECTED NOT DETECTED Final   Acinetobacter baumannii NOT DETECTED NOT DETECTED Final   Enterobacteriaceae species NOT DETECTED NOT DETECTED Final   Enterobacter cloacae complex NOT DETECTED NOT DETECTED Final   Escherichia coli NOT DETECTED NOT DETECTED Final   Klebsiella oxytoca NOT DETECTED NOT DETECTED Final   Klebsiella pneumoniae NOT DETECTED NOT DETECTED Final   Proteus species NOT DETECTED NOT DETECTED Final   Serratia marcescens NOT DETECTED NOT DETECTED Final   Haemophilus influenzae NOT DETECTED NOT DETECTED Final   Neisseria meningitidis NOT DETECTED NOT DETECTED Final   Pseudomonas aeruginosa NOT DETECTED NOT DETECTED Final   Candida albicans NOT DETECTED NOT DETECTED Final   Candida glabrata NOT DETECTED NOT DETECTED Final   Candida krusei NOT DETECTED NOT  DETECTED Final   Candida parapsilosis NOT DETECTED NOT DETECTED Final   Candida tropicalis NOT DETECTED NOT DETECTED Final    Comment: Performed at [facility], [place] [address] [place], [place] [address]  MRSA  PCR Screening     Status: Abnormal   Collection Time: 06/08/19 12:11 AM   Specimen: Nasal Mucosa; Nasopharyngeal  Result Value Ref Range Status   MRSA by PCR POSITIVE (A) NEGATIVE Final    Comment:        The GeneXpert MRSA Assay (FDA approved for NASAL specimens only), is one component of a comprehensive MRSA colonization surveillance program. It is not intended to diagnose MRSA infection nor to guide or monitor treatment for MRSA infections. RESULT CALLED TO, READ BACK BY AND VERIFIED WITH: MAHMOUD,Z RN 06/08/2019 AT 5456 SKEEN,P Performed at [facility], [place] [address] [place], [place] [address]          Radiology Studies: No results found.      Scheduled Meds: . allopurinol  200 mg Oral Daily  . apixaban  5 mg Oral BID  . atorvastatin  10 mg Oral QHS  . Chlorhexidine Gluconate Cloth  6 each Topical Q0600  . famotidine  20 mg Oral BID  . insulin aspart  0-9 Units Subcutaneous TID WC  . isosorbide mononitrate  60 mg Oral Daily  . mupirocin ointment  1 application Nasal BID  . topiramate  25 mg Oral QHS  . vitamin B-12  1,000 mcg Oral Daily   Continuous Infusions: . ampicillin (OMNIPEN) IV 2 g (06/09/19 0551)          Aline August, MD [facility] 06/09/2019, 7:43 AM

## 2019-06-09 NOTE — Progress Notes (Signed)
  Echocardiogram 2D Echocardiogram has been performed.  [name] 06/09/2019, 11:24 AM

## 2019-06-09 NOTE — Progress Notes (Signed)
[facility] for Infectious Disease    Date of Admission:  06/06/2019   Total days of antibiotics 4/amp day 2           ID: Kara Ortega is a 86 y.o. female with  Enterococcal bacteremia Active Problems:   Alzheimer's dementia ([place])   AKI (acute kidney injury) ([place])   Acute lower UTI   Lactic acidosis   UTI (urinary tract infection)   Bacteremia due to Enterococcus   Advanced care planning/counseling discussion   Goals of care, counseling/discussion   Palliative care by specialist   Altered mental status    Subjective: Afebrile, but sleeping, arousable but does not answer questions  Medications:  . allopurinol  200 mg Oral Daily  . apixaban  5 mg Oral BID  . atorvastatin  10 mg Oral QHS  . Chlorhexidine Gluconate Cloth  6 each Topical Q0600  . famotidine  20 mg Oral BID  . insulin aspart  0-9 Units Subcutaneous TID WC  . isosorbide mononitrate  60 mg Oral Daily  . mupirocin ointment  1 application Nasal BID  . topiramate  25 mg Oral QHS  . vitamin B-12  1,000 mcg Oral Daily    Objective: Vital signs in last 24 hours: Temp:  [97.6 F (36.4 C)-98.5 F (36.9 C)] 98.1 F (36.7 C) (03/11 1634) Pulse Rate:  [75-86] 75 (03/11 1634) Resp:  [14-18] 18 (03/11 1634) BP: (105-122)/(61-72) 122/67 (03/11 1634) SpO2:  [90 %-95 %] 92 % (03/11 1634) Physical Exam  Constitutional:  oriented to person appears well-developed and well-nourished. No distress.  HENT: [place]/AT, PERRLA, no scleral icterus Mouth/Throat: Oropharynx is clear and moist. No oropharyngeal exudate.  Cardiovascular: Normal rate, regular rhythm and normal heart sounds.soft murmur Pulmonary/Chest: Effort normal and breath sounds normal. No respiratory distress.  has no wheezes.  Neck = supple, no nuchal rigidity Abdominal: Soft. Bowel sounds are normal.  exhibits no distension. There is no tenderness.  Lymphadenopathy: no cervical adenopathy. No axillary adenopathy Neurological: alert and oriented to person,  moves all extremities Skin: Skin is warm and dry. No rash noted. No erythema.  Psychiatric: a normal mood and affect.  behavior is normal.    Lab Results Recent Labs    06/08/19 0448 06/09/19 0427  WBC 8.3 9.3  HGB 14.1 15.2*  HCT 43.9 46.8*  NA 140 140  K 3.9 4.8  CL 110 108  CO2 18* 18*  BUN 9 8  CREATININE 0.78 0.81   Liver Panel Recent Labs    06/08/19 0448 06/09/19 0427  PROT 5.6* 5.8*  ALBUMIN 2.7* 2.9*  AST 28 51*  ALT 23 16  ALKPHOS 59 59  BILITOT 0.7 2.1*   Sedimentation Rate No results for input(s): ESRSEDRATE in the last 72 hours. C-Reactive Protein No results for input(s): CRP in the last 72 hours.  Microbiology: 3/8 1 of 4 bottles + 3/9 1 of 4 bottles + Studies/Results: ECHOCARDIOGRAM COMPLETE  Result Date: 06/09/2019    ECHOCARDIOGRAM REPORT   Patient Name:   Kara Ortega Date of Exam: 06/09/2019 Medical Rec #:  [id number]       Height:       63.0 in Accession #:    [id number]      Weight:       178.0 lb Date of Birth:  [birth year]       BSA:          1.840 m Patient Age:    66 years  BP:           111/61 mmHg Patient Gender: F               HR:           87 bpm. Exam Location:  Inpatient Procedure: 2D Echo, Cardiac Doppler and Color Doppler Indications:    Bacteremia 790.7 / R78.81  History:        Patient has prior history of Echocardiogram examinations, most                 recent 04/27/2017. CHF, Stroke and COPD, Arrythmias:Atrial                 Fibrillation, Signs/Symptoms:Dyspnea and Chest Pain; Risk                 Factors:Hypertension, Diabetes, Dyslipidemia and Sleep Apnea.                 CKD.  Sonographer:    Tiffany Dance Referring Phys: [id number] [place]  1. Intracavitary gradient. Peak velocity 4.97 m/s. Peak gradient 99 mmHg. Left ventricular ejection fraction, by estimation, is 65 to 70%. Left ventricular ejection fraction by PLAX is 73 %. The left ventricle has hyperdynamic function. The left ventricle has no regional  wall motion abnormalities. There is mild concentric left ventricular hypertrophy. Left ventricular diastolic parameters are consistent with Grade I diastolic dysfunction (impaired relaxation).  2. Right ventricular systolic function is normal. The right ventricular size is normal. There is normal pulmonary artery systolic pressure.  3. Left atrial size was severely dilated.  4. Severe mitral annular calcification. There is a small mobile density on the left atrial side. This density was present on the echo dates 04/27/17.. The mitral valve is degenerative. Mild mitral valve regurgitation. Mild mitral stenosis.  5. The aortic valve is normal in structure. Aortic valve regurgitation is mild. Moderate aortic valve stenosis. Aortic valve mean gradient measures 19.0 mmHg. Aortic valve Vmax measures 2.96 m/s.  6. The inferior vena cava is normal in size with greater than 50% respiratory variability, suggesting right atrial pressure of 3 mmHg. FINDINGS  Left Ventricle: Intracavitary gradient. Peak velocity 4.97 m/s. Peak gradient 99 mmHg. Left ventricular ejection fraction, by estimation, is 65 to 70%. Left ventricular ejection fraction by PLAX is 73 % The left ventricle has hyperdynamic function. The left ventricle has no regional wall motion abnormalities. The left ventricular internal cavity size was normal in size. There is mild concentric left ventricular hypertrophy. Left ventricular diastolic parameters are consistent with Grade I diastolic dysfunction (impaired relaxation). Right Ventricle: The right ventricular size is normal. No increase in right ventricular wall thickness. Right ventricular systolic function is normal. There is normal pulmonary artery systolic pressure. The tricuspid regurgitant velocity is 2.17 m/s, and  with an assumed right atrial pressure of 3 mmHg, the estimated right ventricular systolic pressure is 17.6 mmHg. Left Atrium: Left atrial size was severely dilated. Right Atrium: Right atrial  size was normal in size. Pericardium: There is no evidence of pericardial effusion. Mitral Valve: Severe mitral annular calcification. There is a small mobile density on the left atrial side. This density was present on the echo dates 04/27/17. The mitral valve is degenerative in appearance. Normal mobility of the mitral valve leaflets. Severe mitral annular calcification. Mild mitral valve regurgitation. Mild mitral valve stenosis. MV peak gradient, 20.2 mmHg. The mean mitral valve gradient is 5.0 mmHg. Tricuspid Valve: The tricuspid valve is normal in structure. Tricuspid  valve regurgitation is mild . No evidence of tricuspid stenosis. Aortic Valve: The aortic valve is normal in structure.. There is moderate thickening and moderate calcification of the aortic valve. Aortic valve regurgitation is mild. Moderate aortic stenosis is present. There is moderate thickening of the aortic valve. There is moderate calcification of the aortic valve. Aortic valve mean gradient measures 19.0 mmHg. Aortic valve peak gradient measures 35.2 mmHg. Aortic valve area, by VTI measures 1.41 cm. Pulmonic Valve: The pulmonic valve was normal in structure. Pulmonic valve regurgitation is not visualized. No evidence of pulmonic stenosis. Aorta: The aortic root is normal in size and structure. Venous: The inferior vena cava is normal in size with greater than 50% respiratory variability, suggesting right atrial pressure of 3 mmHg. IAS/Shunts: No atrial level shunt detected by color flow Doppler.  LEFT VENTRICLE PLAX 2D LV EF:         Left ventricular ejection fraction by PLAX is 73 % LVIDd:         2.50 cm LVIDs:         1.50 cm LV PW:         1.29 cm LV IVS:        1.20 cm LVOT diam:     1.90 cm LV SV:         70 LV SV Index:   38 LVOT Area:     2.84 cm  RIGHT VENTRICLE             IVC RV Basal diam:  1.80 cm     IVC diam: 1.60 cm RV S prime:     20.20 cm/s TAPSE (M-mode): 2.0 cm LEFT ATRIUM             Index       RIGHT ATRIUM           Index LA diam:        4.20 cm 2.28 cm/m  RA Area:     7.29 cm LA Vol (A2C):   62.0 ml 33.69 ml/m RA Volume:   10.70 ml 5.81 ml/m LA Vol (A4C):   85.3 ml 46.35 ml/m LA Biplane Vol: 75.3 ml 40.92 ml/m  AORTIC VALVE AV Area (Vmax):    1.45 cm AV Area (Vmean):   1.50 cm AV Area (VTI):     1.41 cm AV Vmax:           296.50 cm/s AV Vmean:          201.500 cm/s AV VTI:            0.497 m AV Peak Grad:      35.2 mmHg AV Mean Grad:      19.0 mmHg LVOT Vmax:         152.00 cm/s LVOT Vmean:        106.500 cm/s LVOT VTI:          0.246 m LVOT/AV VTI ratio: 0.50  AORTA Ao Root diam: 3.20 cm Ao Asc diam:  3.20 cm MITRAL VALVE                TRICUSPID VALVE MV Area (PHT): 1.38 cm     TR Peak grad:   18.8 mmHg MV Peak grad:  20.2 mmHg    TR Vmax:        217.00 cm/s MV Mean grad:  5.0 mmHg MV Vmax:       2.25 m/s     SHUNTS MV Vmean:      103.0  cm/s   Systemic VTI:  0.25 m MV Decel Time: 549 msec     Systemic Diam: 1.90 cm MV E velocity: 90.00 cm/s MV A velocity: 207.00 cm/s MV E/A ratio:  0.43 Skeet Latch MD Electronically signed by Skeet Latch MD Signature Date/Time: 06/09/2019/3:27:58 PM    Final      Assessment/Plan: Enterococcal bacteremia = TTE does show mobile mass however this was previously known in jan 2019. Since she had repeat blood cx over 2 days suspect that her ongoing bacteremia places her at high risk for having endocarditis. Will discuss with cardiology if they feel she is too high risk for TEE, if so, then would recommend to treat her with dual therapy with ampicillin plus ceftriaxone for 6 wks  [facility] for Infectious Diseases Cell: [phone] Pager: [phone]  06/09/2019, 6:10 PM

## 2019-06-09 NOTE — Progress Notes (Signed)
Daily Progress Note   Patient Name: Kara Ortega       Date: 06/09/2019 DOB: 17-Mar-1930  Age: 86 y.o. MRN#: [id number] Attending Physician: Aline August, MD Primary Care Physician: Carol Ada, MD Admit Date: 06/06/2019  Reason for Consultation/Follow-up: Establishing goals of care  Subjective: Evaluated patient- she was resting comfortably- having TEE completed.  Spoke via phone with her sonLiliane Channel.  Prior to this admission patient was living at [facility] in the memory care unit. She was wheelchair bound- but had progressed to walking about 100 ft with a walker at the [facility]. She was able to feed herself and speak appropriately in complete sentences.  We discussed code status- I read Dr. Kingsley Plan 06/07/2018 note to [facility] detailing the conversation regarding DNR that patient had and confirmed to [facility] that patient did have the DNR form that was completed that day. Rick agreed to DNR status for patient given that she had that discussion and expressed her desire for "quick, painless, death".  Further goals of care were discussed- [name] would like to continue current care and hopes for patient to be discharged to SNF with rehab. We discussed that patient's functional and cognitive status is likely to be less after this most recent insult to her body. [name] has noted there was a decrease after her bout with COVID.  l offered to complete a MOST form with [name] via [place] would like to complete this but his time was limited- agreed to followup tomorrow to complete MOST form.   Review of Systems  Unable to perform ROS: Dementia    Length of Stay: 3  Current Medications: Scheduled Meds:  . allopurinol  200 mg Oral Daily  . apixaban  5 mg Oral BID  . atorvastatin  10 mg Oral QHS  .  Chlorhexidine Gluconate Cloth  6 each Topical Q0600  . famotidine  20 mg Oral BID  . insulin aspart  0-9 Units Subcutaneous TID WC  . isosorbide mononitrate  60 mg Oral Daily  . mupirocin ointment  1 application Nasal BID  . topiramate  25 mg Oral QHS  . vitamin B-12  1,000 mcg Oral Daily    Continuous Infusions: . ampicillin (OMNIPEN) IV 2 g (06/09/19 0942)    PRN Meds: acetaminophen, ALPRAZolam, nitroGLYCERIN, traMADol  Physical Exam Vitals and nursing note  reviewed.  Constitutional:      Appearance: Normal appearance.  Skin:    General: Skin is warm and dry.     Coloration: Skin is pale.  Neurological:     Mental Status: She is disoriented.             Vital Signs: BP 111/61 (BP Location: Right Arm)   Pulse 86   Temp 97.6 F (36.4 C) (Oral)   Resp 18   Ht 5\' 3"  (1.6 m)   Wt 80.7 kg   SpO2 90%   BMI 31.53 kg/m  SpO2: SpO2: 90 % O2 Device: O2 Device: Room Air O2 Flow Rate:    Intake/output summary:   Intake/Output Summary (Last 24 hours) at 06/09/2019 1134 Last data filed at 06/09/2019 3845 Gross per 24 hour  Intake 725.56 ml  Output 2100 ml  Net -1374.44 ml   LBM: Last BM Date: 06/09/19 Baseline Weight: Weight: 80.7 kg Most recent weight: Weight: 80.7 kg       Palliative Assessment/Data: PPS: 30%      Patient Active Problem List   Diagnosis Date Noted  . Bacteremia due to Enterococcus 06/08/2019  . Advanced care planning/counseling discussion   . Goals of care, counseling/discussion   . Palliative care by specialist   . Acute lower UTI 06/06/2019  . Lactic acidosis 06/06/2019  . UTI (urinary tract infection) 06/06/2019  . Dementia without behavioral disturbance ([place])   . OSA on CPAP   . Palliative care encounter   . Acute respiratory failure due to COVID-19 ([place]) 04/13/2019  . Essential tremor 09/14/2014  . High cholesterol 05/19/2014  . Persistent atrial fibrillation ([place]) 05/19/2014  . Long-term use of high-risk medication 05/19/2014  .  Accident due to mechanical fall without injury 03/24/2014  . Diastolic dysfunction with chronic heart failure ([place]) 03/24/2014  . Sinus bradycardia 03/24/2014  . Hypotension 03/24/2014  . OSA (obstructive sleep apnea) 03/24/2014  . Warfarin anticoagulation 03/24/2014  . Aortic stenosis 03/24/2014  . Mitral stenosis 03/24/2014  . DOE (dyspnea on exertion) 03/24/2014  . Recurrent falls 03/24/2014  . AKI (acute kidney injury) ([place]) 03/23/2014  . Alzheimer's dementia ([employer]) 12/26/2013  . Unstable angina ([place]) 09/14/2013  . Diarrhea 09/14/2013  . Chest pain at rest- most likely GI related 09/14/2013  . CHF (congestive heart failure) ([place])   . A-fib ([place])   . Diabetes ([place])   . Dyspnea on exertion 03/25/2013    Palliative Care Assessment & Plan   Patient Profile:  86 y.o. female  with past medical history of recent [place] hospitalization dc'd 1/21 to the [facility], Alzheimer's dementia, CKD3, OSA on CPAP, admitted on 06/06/2019 with altered mental status and findings of dehydration, UTI, and bacteremia- etiology unclear- bacteria growing in urine is different from blood culture findings, TEE pending for possible endocarditis.  Palliative medicine consulted for [place].   Assessment/Recommendations/Plan   Alzheimer's dementia FAST 6c- likely to need SNF at discharge  Bacteremia- continue current plan for antibiotics  Goals of care- DNR status- PMT provider to contact [facility] tomorrow to complete MOST form- would benefit from outpatient Palliative- looks like referral was made during her last admission- not sure if it was completed- electronic copy of [facility] sent to [facility] via email  Goals of Care and Additional Recommendations:  Limitations on Scope of Treatment: Full Scope Treatment  Code Status:  DNR  Prognosis:   Unable to determine  Discharge Planning:  [place] for rehab with Palliative care service follow-up  Care plan was discussed with patient's son-  [name].  Thank you for allowing the Palliative Medicine Team to assist in the care of this patient.   Time In: 1045 Time Out: 1145 Total Time 60 minutes Prolonged Time Billed yes      Greater than 50%  of this time was spent counseling and coordinating care related to the above assessment and plan.  Mariana Kaufman, AGNP-C Palliative Medicine   Please contact Palliative Medicine Team phone at [phone] for questions and concerns.

## 2019-06-09 NOTE — Plan of Care (Signed)
  Problem: Activity: Goal: Risk for activity intolerance will decrease Outcome: Progressing   Problem: Nutrition: Goal: Adequate nutrition will be maintained Outcome: Progressing   

## 2019-06-10 DIAGNOSIS — R7881 Bacteremia: Secondary | ICD-10-CM

## 2019-06-10 DIAGNOSIS — F0280 Dementia in other diseases classified elsewhere without behavioral disturbance: Secondary | ICD-10-CM

## 2019-06-10 DIAGNOSIS — B952 Enterococcus as the cause of diseases classified elsewhere: Secondary | ICD-10-CM

## 2019-06-10 DIAGNOSIS — L8990 Pressure ulcer of unspecified site, unspecified stage: Secondary | ICD-10-CM | POA: Insufficient documentation

## 2019-06-10 DIAGNOSIS — G301 Alzheimer's disease with late onset: Secondary | ICD-10-CM

## 2019-06-10 LAB — CBC WITH DIFFERENTIAL/PLATELET
Abs Immature Granulocytes: 0.05 10*3/uL (ref 0.00–0.07)
Basophils Absolute: 0 10*3/uL (ref 0.0–0.1)
Basophils Relative: 0 %
Eosinophils Absolute: 0.1 10*3/uL (ref 0.0–0.5)
Eosinophils Relative: 2 %
HCT: 43.5 % (ref 36.0–46.0)
Hemoglobin: 14.3 g/dL (ref 12.0–15.0)
Immature Granulocytes: 1 %
Lymphocytes Relative: 21 %
Lymphs Abs: 1.7 10*3/uL (ref 0.7–4.0)
MCH: 28.9 pg (ref 26.0–34.0)
MCHC: 32.9 g/dL (ref 30.0–36.0)
MCV: 88.1 fL (ref 80.0–100.0)
Monocytes Absolute: 0.8 10*3/uL (ref 0.1–1.0)
Monocytes Relative: 9 %
Neutro Abs: 5.4 10*3/uL (ref 1.7–7.7)
Neutrophils Relative %: 67 %
Platelets: 234 10*3/uL (ref 150–400)
RBC: 4.94 MIL/uL (ref 3.87–5.11)
RDW: 19.1 % — ABNORMAL HIGH (ref 11.5–15.5)
WBC: 8 10*3/uL (ref 4.0–10.5)
nRBC: 0 % (ref 0.0–0.2)

## 2019-06-10 LAB — GLUCOSE, CAPILLARY
Glucose-Capillary: 176 mg/dL — ABNORMAL HIGH (ref 70–99)
Glucose-Capillary: 186 mg/dL — ABNORMAL HIGH (ref 70–99)
Glucose-Capillary: 156 mg/dL — ABNORMAL HIGH (ref 70–99)
Glucose-Capillary: 158 mg/dL — ABNORMAL HIGH (ref 70–99)
Glucose-Capillary: 167 mg/dL — ABNORMAL HIGH (ref 70–99)
Glucose-Capillary: 155 mg/dL — ABNORMAL HIGH (ref 70–99)

## 2019-06-10 LAB — COMPREHENSIVE METABOLIC PANEL
ALT: 20 U/L (ref 0–44)
AST: 20 U/L (ref 15–41)
Albumin: 3 g/dL — ABNORMAL LOW (ref 3.5–5.0)
Alkaline Phosphatase: 63 U/L (ref 38–126)
Anion gap: 14 (ref 5–15)
BUN: 7 mg/dL — ABNORMAL LOW (ref 8–23)
CO2: 20 mmol/L — ABNORMAL LOW (ref 22–32)
Calcium: 9.4 mg/dL (ref 8.9–10.3)
Chloride: 107 mmol/L (ref 98–111)
Creatinine, Ser: 0.78 mg/dL (ref 0.44–1.00)
GFR calc Af Amer: >60 mL/min (ref >60)
GFR calc non Af Amer: >60 mL/min (ref >60)
Glucose, Bld: 157 mg/dL — ABNORMAL HIGH (ref 70–99)
Potassium: 3.3 mmol/L — ABNORMAL LOW (ref 3.5–5.1)
Sodium: 141 mmol/L (ref 135–145)
Total Bilirubin: 0.6 mg/dL (ref 0.3–1.2)
Total Protein: 6.1 g/dL — ABNORMAL LOW (ref 6.5–8.1)

## 2019-06-10 LAB — MAGNESIUM: Magnesium: 1.7 mg/dL (ref 1.7–2.4)

## 2019-06-10 MED ORDER — SODIUM CHLORIDE 0.9 % IV SOLN
2.0000 g | Freq: Two times a day (BID) | INTRAVENOUS | Status: DC
  Administered 2019-06-10 – 2019-06-14 (×9): 2 g via INTRAVENOUS
  Filled 2019-06-10 (×9): qty 20

## 2019-06-10 MED ORDER — POTASSIUM CHLORIDE CRYS ER 20 MEQ PO TBCR
40.0000 meq | EXTENDED_RELEASE_TABLET | Freq: Two times a day (BID) | ORAL | Status: DC
  Administered 2019-06-10: 40 meq via ORAL
  Filled 2019-06-10 (×2): qty 2

## 2019-06-10 MED ORDER — POTASSIUM CHLORIDE 10 MEQ/100ML IV SOLN
10.0000 meq | INTRAVENOUS | Status: AC
  Administered 2019-06-10 – 2019-06-11 (×4): 10 meq via INTRAVENOUS
  Filled 2019-06-10 (×4): qty 100

## 2019-06-10 NOTE — Progress Notes (Signed)
PHARMACY CONSULT NOTE FOR:  OUTPATIENT  PARENTERAL ANTIBIOTIC THERAPY (OPAT)  Indication: Enterococcal bacteremia/endocarditis  Regimen: Ampicillin 2 gm every 6 hours + Ceftriaxone 2 gm every 12 hours  End date: 07/20/19  IV antibiotic discharge orders are pended. To discharging provider:  please sign these orders via discharge navigator,  Select New Orders & click on the button choice - Manage This Unsigned Work.     Thank you for allowing pharmacy to be a part of this patient's care.  Jimmy Footman, PharmD, BCPS, BCIDP Infectious Diseases Clinical Pharmacist Phone: [phone] 06/10/2019, 12:46 PM

## 2019-06-10 NOTE — Plan of Care (Signed)
  Problem: Clinical Measurements: Goal: Ability to maintain clinical measurements within normal limits will improve Outcome: Progressing   

## 2019-06-10 NOTE — Plan of Care (Signed)
  Problem: Nutrition: Goal: Adequate nutrition will be maintained Outcome: Not Progressing   

## 2019-06-10 NOTE — Progress Notes (Signed)
Patient ID: Kara Ortega, female   DOB: September 07, 1929, 86 y.o.   MRN: [id number]  PROGRESS NOTE    Kara Ortega  PJA:[id number] DOB: Jul 30, 1929 DOA: 06/06/2019 PCP: Carol Ada, MD   Brief Narrative:  86 year old female with history of permanent A. fib, chronic kidney disease stage II, advanced dementia, diabetes mellitus type 2 on insulin, hypertension, nonhemorrhagic stroke with left-sided hemiparesis, Covid infection in January 2021 presented from memory care nursing facility with hallucinations, generalized weakness, decreased responsiveness and dark/cloudy urine and admitted for dehydration, AKI, toxic encephalopathy due to UTI and started on IV antibiotics and fluids.  Assessment & Plan:   Enterococcus faecalis bacteremia: Present on admission UTI -Continue ampicillin.  ID following.  Repeat blood cultures negative so far.  2D echo showed EF of 65 to 70% with grade 1 diastolic dysfunction and a small mobile density on the left atrial side, which was present on the echo date 04/27/2017 apparently.  ID is recommending to involve cardiology to see if patient can have a TEE done.  Will consult cardiology for the same.  If TEE cannot be done, ID recommends dual therapy with ampicillin and Rocephin for 6 weeks. -Most likely secondary to UTI.  Urine cultures growing Proteus mirabilis though.   Acute metabolic encephalopathy: Most likely secondary to dehydration and UTI History of dementia  -Monitor mental status.  Mental status probably back to her baseline. -Fall precautions  Dehydration Acute kidney injury -From above and poor oral intake.  Treated with IV fluids.  Off IV fluids. -Encourage oral intake. -Acute kidney injury has resolved -Labs pending for today.  Hypomagnesemia -Labs pending for today.  Permanent A. Fib -Mali VASC 2 Score of > 4  -Rate controlled.  Continue Eliquis.  History of nonhemorrhagic stroke with mild left-sided weakness -Stable.  Continue Eliquis along  with statin  Hypertension  -Blood pressure intermittently elevated.  Continue Imdur.  Might have to resume diuretics.  Dyslipidemia -Continue statin  History of gout  -continue allopurinol  Chronic diastolic CHF with EF of 34% -Currently compensated.  Might have to resume diuretics.  Strict input and output.  Daily weights.  Diabetes mellitus type 2 -A1c 8.1.  Continue CBGs with SSI.  Carb modified diet  Generalized deconditioning -PT recommends SNF placement.  Social worker following.  Overall prognosis is guarded to poor.  Palliative care evaluation appreciated.  CODE STATUS has been changed to DNR  DVT prophylaxis: Eliquis Code Status: DNR Family Communication: Spoke to son/Richard Winona on phone on 06/10/2019 Disposition Plan: Work-up for Enterococcus bacteremia still ongoing and patient might need need TEE.  Will need to remain inpatient till then.   Subsequently discharge to rehab facility in 2-3 days if remains stable and once cleared by consultants.  Consultants: ID/palliative care  Procedures:  Echo IMPRESSIONS    1. Intracavitary gradient. Peak velocity 4.97 m/s. Peak gradient 99 mmHg.  Left ventricular ejection fraction, by estimation, is 65 to 70%. Left  ventricular ejection fraction by PLAX is 73 %. The left ventricle has  hyperdynamic function. The left  ventricle has no regional wall motion abnormalities. There is mild  concentric left ventricular hypertrophy. Left ventricular diastolic  parameters are consistent with Grade I diastolic dysfunction (impaired  relaxation).  2. Right ventricular systolic function is normal. The right ventricular  size is normal. There is normal pulmonary artery systolic pressure.  3. Left atrial size was severely dilated.  4. Severe mitral annular calcification. There is a small mobile density  on the left atrial side.  This density was present on the echo dates  04/27/17.. The mitral valve is degenerative. Mild mitral  valve  regurgitation. Mild mitral stenosis.  5. The aortic valve is normal in structure. Aortic valve regurgitation is  mild. Moderate aortic valve stenosis. Aortic valve mean gradient measures  19.0 mmHg. Aortic valve Vmax measures 2.96 m/s.  6. The inferior vena cava is normal in size with greater than 50%  respiratory variability, suggesting right atrial pressure of 3 mmHg.   Antimicrobials:  Anti-infectives (From admission, onward)   Start     Dose/Rate Route Frequency Ordered Stop   06/08/19 1600  ampicillin (OMNIPEN) 2 g in sodium chloride 0.9 % 100 mL IVPB     2 g 300 mL/hr over 20 Minutes Intravenous Every 6 hours 06/08/19 1144     06/08/19 0845  ampicillin (OMNIPEN) 2 g in sodium chloride 0.9 % 100 mL IVPB     2 g 300 mL/hr over 20 Minutes Intravenous  Once 06/08/19 0835 06/08/19 1032   06/07/19 1630  cefTRIAXone (ROCEPHIN) 2 g in sodium chloride 0.9 % 100 mL IVPB  Status:  Discontinued     2 g 200 mL/hr over 30 Minutes Intravenous Every 24 hours 06/07/19 0953 06/08/19 0834   06/06/19 1630  cefTRIAXone (ROCEPHIN) 1 g in sodium chloride 0.9 % 100 mL IVPB  Status:  Discontinued     1 g 200 mL/hr over 30 Minutes Intravenous  Once 06/06/19 1619 06/06/19 1626   06/06/19 1630  cefTRIAXone (ROCEPHIN) 1 g in sodium chloride 0.9 % 100 mL IVPB  Status:  Discontinued     1 g 200 mL/hr over 30 Minutes Intravenous Every 24 hours 06/06/19 1620 06/07/19 0953       Subjective: Patient seen and examined at bedside.  She is a poor historian.  She is awake but confused.  No overnight fever or vomiting or agitation reported by nursing staff. Objective: Vitals:   06/09/19 0928 06/09/19 1634 06/09/19 2053 06/10/19 0433  BP: 111/61 122/67 (!) 146/91 (!) 139/94  Pulse: 86 75 (!) 104 (!) 105  Resp: 18 18 18 18   Temp: 97.6 F (36.4 C) 98.1 F (36.7 C) 98 F (36.7 C) 97.9 F (36.6 C)  TempSrc: Oral Oral Oral Oral  SpO2: 90% 92% 93% 92%  Weight:   80.7 kg   Height:         Intake/Output Summary (Last 24 hours) at 06/10/2019 0736 Last data filed at 06/10/2019 0615 Gross per 24 hour  Intake 925 ml  Output 500 ml  Net 425 ml   Filed Weights   06/08/19 0917 06/09/19 2053  Weight: 80.7 kg 80.7 kg    Examination:  General exam: No acute distress.  Elderly female.  Awake but confused.  Poor historian.   Respiratory system: Bilateral decreased breath sounds at bases with some scattered crackles Cardiovascular system: S1-S2 heard, intermittently tachycardic Gastrointestinal system: Abdomen is nondistended, soft and nontender.  Bowel sounds are heard  extremities: No clubbing; trace bilateral lower extremity edema present   Data Reviewed: I have personally reviewed following labs and imaging studies  CBC: Recent Labs  Lab 06/06/19 1410 06/07/19 0509 06/08/19 0448 06/09/19 0427  WBC 9.2 9.4 8.3 9.3  NEUTROABS 6.5  --  5.5 6.2  HGB 15.3* 13.2 14.1 15.2*  HCT 47.1* 40.5 43.9 46.8*  MCV 90.4 91.0 90.3 91.1  PLT 242 207 196 081   Basic Metabolic Panel: Recent Labs  Lab 06/06/19 1410 06/07/19 0509 06/08/19 0448 06/09/19 0427  NA 133* 140 140 140  K 4.2 3.0* 3.9 4.8  CL 96* 105 110 108  CO2 23 21* 18* 18*  GLUCOSE 203* 65* 108* 133*  BUN 21 14 9 8   CREATININE 1.32* 0.79 0.78 0.81  CALCIUM 9.2 8.3* 8.9 9.4  MG  --   --  1.4* 1.5*   GFR: Estimated Creatinine Clearance: 49.2 mL/min (by C-G formula based on SCr of 0.81 mg/dL). Liver Function Tests: Recent Labs  Lab 06/06/19 1410 06/08/19 0448 06/09/19 0427  AST 26 28 51*  ALT 21 23 16   ALKPHOS 63 59 59  BILITOT 0.9 0.7 2.1*  PROT 6.0* 5.6* 5.8*  ALBUMIN 3.2* 2.7* 2.9*   No results for input(s): LIPASE, AMYLASE in the last 168 hours. No results for input(s): AMMONIA in the last 168 hours. Coagulation Profile: No results for input(s): INR, PROTIME in the last 168 hours. Cardiac Enzymes: No results for input(s): CKTOTAL, CKMB, CKMBINDEX, TROPONINI in the last 168 hours. BNP (last  3 results) No results for input(s): PROBNP in the last 8760 hours. HbA1C: No results for input(s): HGBA1C in the last 72 hours. CBG: Recent Labs  Lab 06/08/19 2051 06/09/19 0714 06/09/19 1119 06/10/19 0323 06/10/19 0700  GLUCAP 141* 117* 157* 186* 156*   Lipid Profile: No results for input(s): CHOL, HDL, LDLCALC, TRIG, CHOLHDL, LDLDIRECT in the last 72 hours. Thyroid Function Tests: No results for input(s): TSH, T4TOTAL, FREET4, T3FREE, THYROIDAB in the last 72 hours. Anemia Panel: No results for input(s): VITAMINB12, FOLATE, FERRITIN, TIBC, IRON, RETICCTPCT in the last 72 hours. Sepsis Labs: Recent Labs  Lab 06/06/19 1410 06/06/19 1645 06/07/19 1027 06/08/19 0448 06/09/19 0427  PROCALCITON  --   --  <0.10 <0.10 <0.10  LATICACIDVEN 3.4* 2.8*  --   --   --     Recent Results (from the past 240 hour(s))  Urine Culture     Status: Abnormal   Collection Time: 06/06/19  3:03 PM   Specimen: Urine, Catheterized  Result Value Ref Range Status   Specimen Description URINE, CATHETERIZED  Final   Special Requests   Final    ADDED 2253 Performed at [facility], [place] [address] [place], [place] [address]    Culture 70,000 COLONIES/mL PROTEUS MIRABILIS (A)  Final   Report Status 06/09/2019 FINAL  Final   Organism ID, Bacteria PROTEUS MIRABILIS (A)  Final      Susceptibility   Proteus mirabilis - MIC*    AMPICILLIN <=2 SENSITIVE Sensitive     CEFAZOLIN 8 SENSITIVE Sensitive     CEFTRIAXONE <=0.25 SENSITIVE Sensitive     CIPROFLOXACIN >=4 RESISTANT Resistant     GENTAMICIN <=1 SENSITIVE Sensitive     IMIPENEM 2 SENSITIVE Sensitive     NITROFURANTOIN 128 RESISTANT Resistant     TRIMETH/SULFA >=320 RESISTANT Resistant     AMPICILLIN/SULBACTAM <=2 SENSITIVE Sensitive     PIP/TAZO <=4 SENSITIVE Sensitive     * 70,000 COLONIES/mL PROTEUS MIRABILIS  Blood Culture (routine x 2)     Status: Abnormal   Collection Time: 06/06/19  3:20 PM   Specimen: BLOOD  Result Value Ref  Range Status   Specimen Description BLOOD RIGHT ANTECUBITAL  Final   Special Requests   Final    BOTTLES DRAWN AEROBIC AND ANAEROBIC Blood Culture results may not be optimal due to an inadequate volume of blood received in culture bottles   Culture  Setup Time   Final    GRAM POSITIVE COCCI IN CHAINS ANAEROBIC  BOTTLE ONLY CRITICAL RESULT CALLED TO, READ BACK BY AND VERIFIED WITH: [place] [id number] FCP Performed at [facility] [address], [place], [place] [address]    Culture ENTEROCOCCUS FAECALIS (A)  Final   Report Status 06/09/2019 FINAL  Final   Organism ID, Bacteria ENTEROCOCCUS FAECALIS  Final      Susceptibility   Enterococcus faecalis - MIC*    AMPICILLIN <=2 SENSITIVE Sensitive     VANCOMYCIN 1 SENSITIVE Sensitive     GENTAMICIN SYNERGY SENSITIVE Sensitive     * ENTEROCOCCUS FAECALIS  Blood Culture (routine x 2)     Status: None (Preliminary result)   Collection Time: 06/06/19  3:30 PM   Specimen: BLOOD  Result Value Ref Range Status   Specimen Description BLOOD LEFT ANTECUBITAL  Final   Special Requests   Final    BOTTLES DRAWN AEROBIC AND ANAEROBIC Blood Culture results may not be optimal due to an inadequate volume of blood received in culture bottles   Culture   Final    NO GROWTH 4 DAYS Performed at [facility], [place] [address], [place], [place] [address]    Report Status PENDING  Incomplete  SARS CORONAVIRUS 2 (TAT 6-24 HRS) Nasopharyngeal Nasopharyngeal Swab     Status: None   Collection Time: 06/06/19  5:55 PM   Specimen: Nasopharyngeal Swab  Result Value Ref Range Status   SARS Coronavirus 2 NEGATIVE NEGATIVE Final    Comment: (NOTE) SARS-CoV-2 target nucleic acids are NOT DETECTED. The SARS-CoV-2 RNA is generally detectable in upper and lower respiratory specimens during the acute phase of infection. Negative results do not preclude SARS-CoV-2 infection, do not rule out co-infections with other pathogens, and should not be  used as the sole basis for treatment or other patient management decisions. Negative results must be combined with clinical observations, patient history, and epidemiological information. The expected result is Negative. Fact Sheet for Patients: SugarRoll.be Fact Sheet for Healthcare Providers: https://www.woods-mathews.com/ This test is not yet approved or cleared by the [place] FDA and  has been authorized for detection and/or diagnosis of SARS-CoV-2 by FDA under an Emergency Use Authorization (EUA). This EUA will remain  in effect (meaning this test can be used) for the duration of the COVID-19 declaration under Section 56 4(b)(1) of the Act, 21 U.S.C. section 360bbb-3(b)(1), unless the authorization is terminated or revoked sooner. Performed at [facility], [place] [address] [place], [place] [address]   Culture, blood (routine x 2)     Status: None (Preliminary result)   Collection Time: 06/07/19 10:27 AM   Specimen: BLOOD  Result Value Ref Range Status   Specimen Description BLOOD RIGHT ANTECUBITAL  Final   Special Requests   Final    BOTTLES DRAWN AEROBIC AND ANAEROBIC Blood Culture results may not be optimal due to an inadequate volume of blood received in culture bottles   Culture   Final    NO GROWTH 3 DAYS Performed at [facility], [place] [address], [place], [place] [address]    Report Status PENDING  Incomplete  Culture, blood (routine x 2)     Status: Abnormal   Collection Time: 06/07/19 10:28 AM   Specimen: BLOOD RIGHT ARM  Result Value Ref Range Status   Specimen Description BLOOD RIGHT ARM  Final   Special Requests   Final    BOTTLES DRAWN AEROBIC ONLY Blood Culture results may not be optimal due to an inadequate volume  of blood received in culture bottles   Culture  Setup Time   Final    GRAM POSITIVE COCCI IN CHAINS AEROBIC BOTTLE ONLY CRITICAL RESULT CALLED TO, READ BACK BY AND VERIFIED WITH: PHARMD J  [place] [id number] AT 65 BY CM    Culture (A)  Final    ENTEROCOCCUS FAECALIS SUSCEPTIBILITIES PERFORMED ON PREVIOUS CULTURE WITHIN THE LAST 5 DAYS. Performed at [facility], [place] [address] [place], [place] [address]    Report Status 06/09/2019 FINAL  Final  Blood Culture ID Panel (Reflexed)     Status: Abnormal   Collection Time: 06/07/19 10:28 AM  Result Value Ref Range Status   Enterococcus species DETECTED (A) NOT DETECTED Final    Comment: CRITICAL RESULT CALLED TO, READ BACK BY AND VERIFIED WITH: PHARMD J STEENWKY [id number] AT 825 AM BY CM    Vancomycin resistance NOT DETECTED NOT DETECTED Final   Listeria monocytogenes NOT DETECTED NOT DETECTED Final   Staphylococcus species NOT DETECTED NOT DETECTED Final   Staphylococcus aureus (BCID) NOT DETECTED NOT DETECTED Final   Streptococcus species NOT DETECTED NOT DETECTED Final   Streptococcus agalactiae NOT DETECTED NOT DETECTED Final   Streptococcus pneumoniae NOT DETECTED NOT DETECTED Final   Streptococcus pyogenes NOT DETECTED NOT DETECTED Final   Acinetobacter baumannii NOT DETECTED NOT DETECTED Final   Enterobacteriaceae species NOT DETECTED NOT DETECTED Final   Enterobacter cloacae complex NOT DETECTED NOT DETECTED Final   Escherichia coli NOT DETECTED NOT DETECTED Final   Klebsiella oxytoca NOT DETECTED NOT DETECTED Final   Klebsiella pneumoniae NOT DETECTED NOT DETECTED Final   Proteus species NOT DETECTED NOT DETECTED Final   Serratia marcescens NOT DETECTED NOT DETECTED Final   Haemophilus influenzae NOT DETECTED NOT DETECTED Final   Neisseria meningitidis NOT DETECTED NOT DETECTED Final   Pseudomonas aeruginosa NOT DETECTED NOT DETECTED Final   Candida albicans NOT DETECTED NOT DETECTED Final   Candida glabrata NOT DETECTED NOT DETECTED Final   Candida krusei NOT DETECTED NOT DETECTED Final   Candida parapsilosis NOT DETECTED NOT DETECTED Final   Candida tropicalis NOT DETECTED NOT DETECTED Final    Comment:  Performed at [facility], [address], [place], [place] [address]  MRSA PCR Screening     Status: Abnormal   Collection Time: 06/08/19 12:11 AM   Specimen: Nasal Mucosa; Nasopharyngeal  Result Value Ref Range Status   MRSA by PCR POSITIVE (A) NEGATIVE Final    Comment:        The GeneXpert MRSA Assay (FDA approved for NASAL specimens only), is one component of a comprehensive MRSA colonization surveillance program. It is not intended to diagnose MRSA infection nor to guide or monitor treatment for MRSA infections. RESULT CALLED TO, READ BACK BY AND VERIFIED WITH: MAHMOUD,Z RN 06/08/2019 AT 5732 SKEEN,P Performed at [facility], [place] [address] [place], [place] [address]   Culture, blood (routine x 2)     Status: None (Preliminary result)   Collection Time: 06/08/19  8:07 PM   Specimen: BLOOD  Result Value Ref Range Status   Specimen Description BLOOD RIGHT ANTECUBITAL  Final   Special Requests   Final    BOTTLES DRAWN AEROBIC AND ANAEROBIC Blood Culture adequate volume   Culture   Final    NO GROWTH 2 DAYS Performed at [facility], [place] [address] [place], [place] [address]    Report Status PENDING  Incomplete  Culture, blood (routine x 2)  Status: None (Preliminary result)   Collection Time: 06/08/19  8:15 PM   Specimen: BLOOD LEFT HAND  Result Value Ref Range Status   Specimen Description BLOOD LEFT HAND  Final   Special Requests   Final    BOTTLES DRAWN AEROBIC ONLY Blood Culture adequate volume   Culture   Final    NO GROWTH 2 DAYS Performed at [facility], [address], [place], [place] [address]    Report Status PENDING  Incomplete         Radiology Studies: ECHOCARDIOGRAM COMPLETE  Result Date: 06/09/2019    ECHOCARDIOGRAM REPORT   Patient Name:   ASPYNN CLOVER Date of Exam: 06/09/2019 Medical Rec #:  [id number]       Height:       63.0 in Accession #:    [id number]      Weight:       178.0 lb Date of Birth:  26-Jul-1929        BSA:          1.840 m Patient Age:    61 years        BP:           111/61 mmHg Patient Gender: F               HR:           87 bpm. Exam Location:  Inpatient Procedure: 2D Echo, Cardiac Doppler and Color Doppler Indications:    Bacteremia 790.7 / R78.81  History:        Patient has prior history of Echocardiogram examinations, most                 recent 04/27/2017. CHF, Stroke and COPD, Arrythmias:Atrial                 Fibrillation, Signs/Symptoms:Dyspnea and Chest Pain; Risk                 Factors:Hypertension, Diabetes, Dyslipidemia and Sleep Apnea.                 CKD.  Sonographer:    Tiffany Dance Referring Phys: [id number] [place]  1. Intracavitary gradient. Peak velocity 4.97 m/s. Peak gradient 99 mmHg. Left ventricular ejection fraction, by estimation, is 65 to 70%. Left ventricular ejection fraction by PLAX is 73 %. The left ventricle has hyperdynamic function. The left ventricle has no regional wall motion abnormalities. There is mild concentric left ventricular hypertrophy. Left ventricular diastolic parameters are consistent with Grade I diastolic dysfunction (impaired relaxation).  2. Right ventricular systolic function is normal. The right ventricular size is normal. There is normal pulmonary artery systolic pressure.  3. Left atrial size was severely dilated.  4. Severe mitral annular calcification. There is a small mobile density on the left atrial side. This density was present on the echo dates 04/27/17.. The mitral valve is degenerative. Mild mitral valve regurgitation. Mild mitral stenosis.  5. The aortic valve is normal in structure. Aortic valve regurgitation is mild. Moderate aortic valve stenosis. Aortic valve mean gradient measures 19.0 mmHg. Aortic valve Vmax measures 2.96 m/s.  6. The inferior vena cava is normal in size with greater than 50% respiratory variability, suggesting right atrial pressure of 3 mmHg. FINDINGS  Left Ventricle: Intracavitary gradient. Peak  velocity 4.97 m/s. Peak gradient 99 mmHg. Left ventricular ejection fraction, by estimation, is 65 to 70%. Left ventricular ejection fraction by PLAX is 73 % The left ventricle has hyperdynamic  function. The left ventricle has no regional wall motion abnormalities. The left ventricular internal cavity size was normal in size. There is mild concentric left ventricular hypertrophy. Left ventricular diastolic parameters are consistent with Grade I diastolic dysfunction (impaired relaxation). Right Ventricle: The right ventricular size is normal. No increase in right ventricular wall thickness. Right ventricular systolic function is normal. There is normal pulmonary artery systolic pressure. The tricuspid regurgitant velocity is 2.17 m/s, and  with an assumed right atrial pressure of 3 mmHg, the estimated right ventricular systolic pressure is 37.1 mmHg. Left Atrium: Left atrial size was severely dilated. Right Atrium: Right atrial size was normal in size. Pericardium: There is no evidence of pericardial effusion. Mitral Valve: Severe mitral annular calcification. There is a small mobile density on the left atrial side. This density was present on the echo dates 04/27/17. The mitral valve is degenerative in appearance. Normal mobility of the mitral valve leaflets. Severe mitral annular calcification. Mild mitral valve regurgitation. Mild mitral valve stenosis. MV peak gradient, 20.2 mmHg. The mean mitral valve gradient is 5.0 mmHg. Tricuspid Valve: The tricuspid valve is normal in structure. Tricuspid valve regurgitation is mild . No evidence of tricuspid stenosis. Aortic Valve: The aortic valve is normal in structure.. There is moderate thickening and moderate calcification of the aortic valve. Aortic valve regurgitation is mild. Moderate aortic stenosis is present. There is moderate thickening of the aortic valve. There is moderate calcification of the aortic valve. Aortic valve mean gradient measures 19.0 mmHg. Aortic  valve peak gradient measures 35.2 mmHg. Aortic valve area, by VTI measures 1.41 cm. Pulmonic Valve: The pulmonic valve was normal in structure. Pulmonic valve regurgitation is not visualized. No evidence of pulmonic stenosis. Aorta: The aortic root is normal in size and structure. Venous: The inferior vena cava is normal in size with greater than 50% respiratory variability, suggesting right atrial pressure of 3 mmHg. IAS/Shunts: No atrial level shunt detected by color flow Doppler.  LEFT VENTRICLE PLAX 2D LV EF:         Left ventricular ejection fraction by PLAX is 73 % LVIDd:         2.50 cm LVIDs:         1.50 cm LV PW:         1.29 cm LV IVS:        1.20 cm LVOT diam:     1.90 cm LV SV:         70 LV SV Index:   38 LVOT Area:     2.84 cm  RIGHT VENTRICLE             IVC RV Basal diam:  1.80 cm     IVC diam: 1.60 cm RV S prime:     20.20 cm/s TAPSE (M-mode): 2.0 cm LEFT ATRIUM             Index       RIGHT ATRIUM          Index LA diam:        4.20 cm 2.28 cm/m  RA Area:     7.29 cm LA Vol (A2C):   62.0 ml 33.69 ml/m RA Volume:   10.70 ml 5.81 ml/m LA Vol (A4C):   85.3 ml 46.35 ml/m LA Biplane Vol: 75.3 ml 40.92 ml/m  AORTIC VALVE AV Area (Vmax):    1.45 cm AV Area (Vmean):   1.50 cm AV Area (VTI):     1.41 cm AV Vmax:  296.50 cm/s AV Vmean:          201.500 cm/s AV VTI:            0.497 m AV Peak Grad:      35.2 mmHg AV Mean Grad:      19.0 mmHg LVOT Vmax:         152.00 cm/s LVOT Vmean:        106.500 cm/s LVOT VTI:          0.246 m LVOT/AV VTI ratio: 0.50  AORTA Ao Root diam: 3.20 cm Ao Asc diam:  3.20 cm MITRAL VALVE                TRICUSPID VALVE MV Area (PHT): 1.38 cm     TR Peak grad:   18.8 mmHg MV Peak grad:  20.2 mmHg    TR Vmax:        217.00 cm/s MV Mean grad:  5.0 mmHg MV Vmax:       2.25 m/s     SHUNTS MV Vmean:      103.0 cm/s   Systemic VTI:  0.25 m MV Decel Time: 549 msec     Systemic Diam: 1.90 cm MV E velocity: 90.00 cm/s MV A velocity: 207.00 cm/s MV E/A ratio:  0.43  Skeet Latch MD Electronically signed by Skeet Latch MD Signature Date/Time: 06/09/2019/3:27:58 PM    Final         Scheduled Meds: . allopurinol  200 mg Oral Daily  . apixaban  5 mg Oral BID  . atorvastatin  10 mg Oral QHS  . Chlorhexidine Gluconate Cloth  6 each Topical Q0600  . famotidine  20 mg Oral BID  . insulin aspart  0-9 Units Subcutaneous TID WC  . isosorbide mononitrate  60 mg Oral Daily  . mupirocin ointment  1 application Nasal BID  . topiramate  25 mg Oral QHS  . vitamin B-12  1,000 mcg Oral Daily   Continuous Infusions: . ampicillin (OMNIPEN) IV 2 g (06/10/19 0402)          Aline August, MD [facility] 06/10/2019, 7:36 AM

## 2019-06-10 NOTE — Progress Notes (Signed)
Occupational Therapy Treatment Patient Details Name: Kara Ortega MRN: [id number] DOB: January 24, 1930 Today's Date: 06/10/2019    History of present illness 86 yo female admitted to ED on 3/8 from [facility] for UTI, AMS. Pt with recent 04/2019 admission for COVID. PMH includes dementia, afib, CHF, COPD, HTN, neuropathy, CVA with L weakness.   OT comments  Pt not progressing towards OT goals this session, eyes closed throughout session despite multimodal cues, positioning change. Pt with noted good strength, ability to bring hands to face for spontaneous scratching/itching of face and  Hair - but does not engage in functional grooming task or follow any commands during session. Able to sit EOB at min guard - but not interacting with therapists this session. Pt returned supine and repositioned for comfort. OT will continue to follow.   Follow Up Recommendations  SNF;Supervision/Assistance - 24 hour    Equipment Recommendations  Other (comment)(defer to next venue of care)    Recommendations for Other Services      Precautions / Restrictions Precautions Precautions: Fall Precaution Comments: able to reposition herself Restrictions Weight Bearing Restrictions: No       Mobility Bed Mobility Overal bed mobility: Needs Assistance Bed Mobility: Supine to Sit;Sit to Supine Rolling: Mod assist   Supine to sit: Mod assist;+2 for physical assistance;+2 for safety/equipment Sit to supine: +2 for physical assistance;+2 for safety/equipment;Max assist      Transfers Overall transfer level: Needs assistance               General transfer comment: too lethargic to attempt    Balance Overall balance assessment: Needs assistance Sitting-balance support: Feet supported;Bilateral upper extremity supported Sitting balance-Leahy Scale: Fair         Standing balance comment: unsafe to attempt due to arousal                           ADL either performed or assessed  with clinical judgement   ADL Overall ADL's : Needs assistance/impaired                                       General ADL Comments: Pt was able to spontaneously perform scratching head - but would not perform any ADL or grooming tasks as requested. strength 5/5     Vision       Perception     Praxis      Cognition Arousal/Alertness: Lethargic Behavior During Therapy: Flat affect Overall Cognitive Status: History of cognitive impairments - at baseline Area of Impairment: Awareness;Problem solving                       Following Commands: Follows one step commands inconsistently;Follows one step commands with increased time Safety/Judgement: Decreased awareness of deficits Awareness: Intellectual Problem Solving: Slow processing;Requires verbal cues;Requires tactile cues General Comments: no comments were made, does follow commands in part with slow response        Exercises Exercises: General Lower Extremity General Exercises - Lower Extremity Ankle Circles/Pumps: PROM;Both;10 reps Long Arc Quad: PROM;Both;10 reps Heel Slides: PROM;Both;10 reps Hip Flexion/Marching: PROM;Both;10 reps   Shoulder Instructions       General Comments pt is getting up to side of bed with dense cues and full assist, not able to tell whether she is able to decide to assist    Pertinent Vitals/ Pain  Pain Assessment: No/denies pain Pain Intervention(s): Monitored during session  Home Living                                          Prior Functioning/Environment              Frequency  Min 2X/week        Progress Toward Goals  OT Goals(current goals can now be found in the care plan section)  Progress towards OT goals: Not progressing toward goals - comment(decreased arousal and command following today)  Acute Rehab OT Goals Patient Stated Goal: none stated OT Goal Formulation: Patient unable to participate in goal  setting Time For Goal Achievement: 06/21/19 Potential to Achieve Goals: Good  Plan Discharge plan remains appropriate    Co-evaluation    PT/OT/SLP Co-Evaluation/Treatment: Yes Reason for Co-Treatment: Necessary to address cognition/behavior during functional activity;For patient/therapist safety;To address functional/ADL transfers PT goals addressed during session: Balance;Strengthening/ROM OT goals addressed during session: ADL's and self-care;Strengthening/ROM      AM-PAC OT "6 Clicks" Daily Activity     Outcome Measure   Help from another person eating meals?: Total Help from another person taking care of personal grooming?: Total Help from another person toileting, which includes using toliet, bedpan, or urinal?: Total Help from another person bathing (including washing, rinsing, drying)?: Total Help from another person to put on and taking off regular upper body clothing?: Total Help from another person to put on and taking off regular lower body clothing?: Total 6 Click Score: 6    End of Session    OT Visit Diagnosis: Unsteadiness on feet (R26.81);Other abnormalities of gait and mobility (R26.89);Muscle weakness (generalized) (M62.81);Other symptoms and signs involving cognitive function   Activity Tolerance Patient limited by lethargy   Patient Left in bed;with call bell/phone within reach;with bed alarm set   Nurse Communication Mobility status        Time: 1110-1135 OT Time Calculation (min): 25 min  Charges: OT General Charges $OT Visit: 1 Visit OT Treatments $Self Care/Home Management : 8-22 mins  Jesse Sans OTR/L Acute Rehabilitation Services Pager: [phone] Office: [place] 06/10/2019, 1:35 PM

## 2019-06-10 NOTE — Progress Notes (Signed)
[facility]    Date of Admission:  06/06/2019      ID: Kara Ortega is a 86 y.o. female with sepsis due to enterococcal infection Active Problems:   Alzheimer's dementia ([place])   AKI (acute kidney injury) ([place])   Acute lower UTI   Lactic acidosis   UTI (urinary tract infection)   Bacteremia due to Enterococcus   Advanced care planning/counseling discussion   Goals of care, counseling/discussion   Palliative care by specialist   Altered mental status   Pressure injury of skin    Subjective: Afebrile, keeps eyes closed, doesn't follow commands but moves her extremities independently  Medications:  . allopurinol  200 mg Oral Daily  . apixaban  5 mg Oral BID  . atorvastatin  10 mg Oral QHS  . Chlorhexidine Gluconate Cloth  6 each Topical Q0600  . famotidine  20 mg Oral BID  . insulin aspart  0-9 Units Subcutaneous TID WC  . isosorbide mononitrate  60 mg Oral Daily  . mupirocin ointment  1 application Nasal BID  . potassium chloride  40 mEq Oral BID  . topiramate  25 mg Oral QHS  . vitamin B-12  1,000 mcg Oral Daily    Objective: Vital signs in last 24 hours: Temp:  [97.9 F (36.6 C)-98.1 F (36.7 C)] 98.1 F (36.7 C) (03/12 0923) Pulse Rate:  [75-105] 81 (03/12 0923) Resp:  [18] 18 (03/12 0923) BP: (118-146)/(47-94) 118/47 (03/12 0923) SpO2:  [92 %-94 %] 94 % (03/12 0923) Weight:  [80.7 kg] 80.7 kg (03/11 2053) Physical Exam  Constitutional:  oriented to person,  appears well-developed and well-nourished. No distress.  HENT: [place]/AT, PERRLA, no scleral icterus, neck = soft and supple Mouth/Throat: Oropharynx is clear and moist. No oropharyngeal exudate.  Cardiovascular: Normal rate, regular rhythm and normal heart sounds. +systolic murmurs Pulmonary/Chest: Effort normal and breath sounds normal. No respiratory distress.  has no wheezes.  Abdominal: Soft. Bowel sounds are normal.  exhibits no distension. There is no tenderness.    Neurological: moves all extremities independently Skin: Skin is warm and dry. Scattered echymosis  Lab Results Recent Labs    06/09/19 0427 06/10/19 0833  WBC 9.3 8.0  HGB 15.2* 14.3  HCT 46.8* 43.5  NA 140 141  K 4.8 3.3*  CL 108 107  CO2 18* 20*  BUN 8 7*  CREATININE 0.81 0.78   Liver Panel Recent Labs    06/09/19 0427 06/10/19 0833  PROT 5.8* 6.1*  ALBUMIN 2.9* 3.0*  AST 51* 20  ALT 16 20  ALKPHOS 59 63  BILITOT 2.1* 0.6    Microbiology: 3/10 blood cx NGTD  3/9 blood cx enterococcus 3/8 blood cx enterococcus Studies/Results: ECHOCARDIOGRAM COMPLETE  Result Date: 06/09/2019    ECHOCARDIOGRAM REPORT   Patient Name:   Kara Ortega Date of Exam: 06/09/2019 Medical Rec #:  [id number]       Height:       63.0 in Accession #:    [id number]      Weight:       178.0 lb Date of Birth:  05-Aug-1929       BSA:          1.840 m Patient Age:    46 years        BP:           111/61 mmHg Patient Gender: F               HR:  87 bpm. Exam Location:  Inpatient Procedure: 2D Echo, Cardiac Doppler and Color Doppler Indications:    Bacteremia 790.7 / R78.81  History:        Patient has prior history of Echocardiogram examinations, most                 recent 04/27/2017. CHF, Stroke and COPD, Arrythmias:Atrial                 Fibrillation, Signs/Symptoms:Dyspnea and Chest Pain; Risk                 Factors:Hypertension, Diabetes, Dyslipidemia and Sleep Apnea.                 CKD.  Sonographer:    Tiffany Dance Referring Phys: [id number] [place]  1. Intracavitary gradient. Peak velocity 4.97 m/s. Peak gradient 99 mmHg. Left ventricular ejection fraction, by estimation, is 65 to 70%. Left ventricular ejection fraction by PLAX is 73 %. The left ventricle has hyperdynamic function. The left ventricle has no regional wall motion abnormalities. There is mild concentric left ventricular hypertrophy. Left ventricular diastolic parameters are consistent with Grade I diastolic  dysfunction (impaired relaxation).  2. Right ventricular systolic function is normal. The right ventricular size is normal. There is normal pulmonary artery systolic pressure.  3. Left atrial size was severely dilated.  4. Severe mitral annular calcification. There is a small mobile density on the left atrial side. This density was present on the echo dates 04/27/17.. The mitral valve is degenerative. Mild mitral valve regurgitation. Mild mitral stenosis.  5. The aortic valve is normal in structure. Aortic valve regurgitation is mild. Moderate aortic valve stenosis. Aortic valve mean gradient measures 19.0 mmHg. Aortic valve Vmax measures 2.96 m/s.  6. The inferior vena cava is normal in size with greater than 50% respiratory variability, suggesting right atrial pressure of 3 mmHg. FINDINGS  Left Ventricle: Intracavitary gradient. Peak velocity 4.97 m/s. Peak gradient 99 mmHg. Left ventricular ejection fraction, by estimation, is 65 to 70%. Left ventricular ejection fraction by PLAX is 73 % The left ventricle has hyperdynamic function. The left ventricle has no regional wall motion abnormalities. The left ventricular internal cavity size was normal in size. There is mild concentric left ventricular hypertrophy. Left ventricular diastolic parameters are consistent with Grade I diastolic dysfunction (impaired relaxation). Right Ventricle: The right ventricular size is normal. No increase in right ventricular wall thickness. Right ventricular systolic function is normal. There is normal pulmonary artery systolic pressure. The tricuspid regurgitant velocity is 2.17 m/s, and  with an assumed right atrial pressure of 3 mmHg, the estimated right ventricular systolic pressure is 46.6 mmHg. Left Atrium: Left atrial size was severely dilated. Right Atrium: Right atrial size was normal in size. Pericardium: There is no evidence of pericardial effusion. Mitral Valve: Severe mitral annular calcification. There is a small mobile  density on the left atrial side. This density was present on the echo dates 04/27/17. The mitral valve is degenerative in appearance. Normal mobility of the mitral valve leaflets. Severe mitral annular calcification. Mild mitral valve regurgitation. Mild mitral valve stenosis. MV peak gradient, 20.2 mmHg. The mean mitral valve gradient is 5.0 mmHg. Tricuspid Valve: The tricuspid valve is normal in structure. Tricuspid valve regurgitation is mild . No evidence of tricuspid stenosis. Aortic Valve: The aortic valve is normal in structure.. There is moderate thickening and moderate calcification of the aortic valve. Aortic valve regurgitation is mild. Moderate aortic stenosis is present. There  is moderate thickening of the aortic valve. There is moderate calcification of the aortic valve. Aortic valve mean gradient measures 19.0 mmHg. Aortic valve peak gradient measures 35.2 mmHg. Aortic valve area, by VTI measures 1.41 cm. Pulmonic Valve: The pulmonic valve was normal in structure. Pulmonic valve regurgitation is not visualized. No evidence of pulmonic stenosis. Aorta: The aortic root is normal in size and structure. Venous: The inferior vena cava is normal in size with greater than 50% respiratory variability, suggesting right atrial pressure of 3 mmHg. IAS/Shunts: No atrial level shunt detected by color flow Doppler.  LEFT VENTRICLE PLAX 2D LV EF:         Left ventricular ejection fraction by PLAX is 73 % LVIDd:         2.50 cm LVIDs:         1.50 cm LV PW:         1.29 cm LV IVS:        1.20 cm LVOT diam:     1.90 cm LV SV:         70 LV SV Index:   38 LVOT Area:     2.84 cm  RIGHT VENTRICLE             IVC RV Basal diam:  1.80 cm     IVC diam: 1.60 cm RV S prime:     20.20 cm/s TAPSE (M-mode): 2.0 cm LEFT ATRIUM             Index       RIGHT ATRIUM          Index LA diam:        4.20 cm 2.28 cm/m  RA Area:     7.29 cm LA Vol (A2C):   62.0 ml 33.69 ml/m RA Volume:   10.70 ml 5.81 ml/m LA Vol (A4C):   85.3 ml  46.35 ml/m LA Biplane Vol: 75.3 ml 40.92 ml/m  AORTIC VALVE AV Area (Vmax):    1.45 cm AV Area (Vmean):   1.50 cm AV Area (VTI):     1.41 cm AV Vmax:           296.50 cm/s AV Vmean:          201.500 cm/s AV VTI:            0.497 m AV Peak Grad:      35.2 mmHg AV Mean Grad:      19.0 mmHg LVOT Vmax:         152.00 cm/s LVOT Vmean:        106.500 cm/s LVOT VTI:          0.246 m LVOT/AV VTI ratio: 0.50  AORTA Ao Root diam: 3.20 cm Ao Asc diam:  3.20 cm MITRAL VALVE                TRICUSPID VALVE MV Area (PHT): 1.38 cm     TR Peak grad:   18.8 mmHg MV Peak grad:  20.2 mmHg    TR Vmax:        217.00 cm/s MV Mean grad:  5.0 mmHg MV Vmax:       2.25 m/s     SHUNTS MV Vmean:      103.0 cm/s   Systemic VTI:  0.25 m MV Decel Time: 549 msec     Systemic Diam: 1.90 cm MV E velocity: 90.00 cm/s MV A velocity: 207.00 cm/s MV E/A ratio:  0.43 Skeet Latch MD Electronically signed  by Skeet Latch MD Signature Date/Time: 06/09/2019/3:27:58 PM    Final      Assessment/Plan: Complicated enterococcal bacteremia, presumed endocarditis ( + murmur, bacteremia x 2 days) = patient likely to be at risk for aspiration if to undergo TEE. Would recommend to treat for 6 wk with iv ceftriaxone 2gm IV Q12 plus IV ampicillin.  Will need picc line placement, her repeat blood cx will be negative x 48hr tonight. Recommend picc on Saturday  Will place opat and sign off.  Diagnosis: Enterococcal endocarditis  Culture Result: e.faecalis  Allergies  Allergen Reactions  . Avandia [Rosiglitazone] Other (See Comments)    edema  . Benazepril Other (See Comments)    unknown  . Dilaudid [Hydromorphone Hcl] Other (See Comments)    "does not tolerate strong pain medications" per pt  . Fosamax [Alendronate Sodium] Nausea And Vomiting  . Talwin [Pentazocine] Nausea And Vomiting  . Tetanus Toxoids Other (See Comments)    "allergic to something in the tetanus injection" per patient  . Zyrtec [Cetirizine] Other (See Comments)      unknown    OPAT Orders Discharge antibiotics: Per pharmacy protocol: ceftriaxone plus ampicillin  Duration: 6 wk using 3/10 as day 1 End Date: April 21st  [place] Per Protocol:  Home health RN for IV administration and teaching; PICC line care and labs.    Labs weekly while on IV antibiotics: x__ CBC with differential _x_ BMP   _x_ Please pull PIC at completion of IV antibiotics   Fax weekly labs to [phone]  Clinic Follow Up Appt:  in 6 wk   @ [facility] Cell: [phone] Pager: [phone]  06/10/2019, 12:00 PM

## 2019-06-10 NOTE — Evaluation (Signed)
Physical Therapy Evaluation Patient Details Name: Kara Ortega MRN: [id number] DOB: Aug 17, 1929 Today's Date: 06/10/2019   History of Present Illness  86 yo female admitted to ED on 3/8 from [facility] for UTI, AMS. Pt with recent 04/2019 admission for COVID. PMH includes dementia, afib, CHF, COPD, HTN, neuropathy, CVA with L weakness.  Clinical Impression  Pt was seen for mobility and strengthening, noted her stiffness in LE's with hamstrings especially as well as DF ROM loss.  Follow up with her for all acute therapy goals, focusing on getting her to stand up if possible.  Follow also for being OOB to chair if able, to increase her engagement in her environment and active participation.      Follow Up Recommendations SNF    Equipment Recommendations  None recommended by PT(next venue to decide)    Recommendations for Other Services       Precautions / Restrictions Precautions Precautions: Fall Precaution Comments: able to reposition herself Restrictions Weight Bearing Restrictions: No      Mobility  Bed Mobility Overal bed mobility: Needs Assistance Bed Mobility: Supine to Sit;Sit to Supine Rolling: Mod assist   Supine to sit: Mod assist;+2 for physical assistance;+2 for safety/equipment Sit to supine: +2 for physical assistance;+2 for safety/equipment;Max assist      Transfers Overall transfer level: Needs assistance               General transfer comment: too lethargic to attempt  Ambulation/Gait                Stairs            Wheelchair Mobility    Modified Rankin (Stroke Patients Only)       Balance Overall balance assessment: Needs assistance Sitting-balance support: Feet supported;Bilateral upper extremity supported Sitting balance-Leahy Scale: Fair         Standing balance comment: could not attempt                             Pertinent Vitals/Pain Pain Assessment: No/denies pain    Home Living                         Prior Function                 Hand Dominance        Extremity/Trunk Assessment                Communication      Cognition Arousal/Alertness: Lethargic Behavior During Therapy: Flat affect Overall Cognitive Status: History of cognitive impairments - at baseline Area of Impairment: Awareness;Problem solving                       Following Commands: Follows one step commands inconsistently;Follows one step commands with increased time Safety/Judgement: Decreased awareness of deficits Awareness: Intellectual Problem Solving: Slow processing;Requires verbal cues;Requires tactile cues General Comments: no comments were made, does follow commands in part with slow response      General Comments General comments (skin integrity, edema, etc.): pt is getting up to side of bed with dense cues and full assist, not able to tell whether she is able to decide to assist    Exercises General Exercises - Lower Extremity Ankle Circles/Pumps: PROM;Both;10 reps Long Arc Quad: PROM;Both;10 reps Heel Slides: PROM;Both;10 reps Hip Flexion/Marching: PROM;Both;10 reps   Assessment/Plan    PT Assessment  PT Problem List         PT Treatment Interventions      PT Goals (Current goals can be found in the Care Plan section)  Acute Rehab PT Goals Patient Stated Goal: none stated    Frequency Min 2X/week   Barriers to discharge        Co-evaluation               AM-PAC PT "6 Clicks" Mobility  Outcome Measure Help needed turning from your back to your side while in a flat bed without using bedrails?: A Lot Help needed moving from lying on your back to sitting on the side of a flat bed without using bedrails?: Total Help needed moving to and from a bed to a chair (including a wheelchair)?: Total Help needed standing up from a chair using your arms (e.g., wheelchair or bedside chair)?: Total Help needed to walk in hospital room?:  Total Help needed climbing 3-5 steps with a railing? : Total 6 Click Score: 7    End of Session   Activity Tolerance: Patient limited by fatigue;Treatment limited secondary to medical complications (Comment) Patient left: in bed;with call bell/phone within reach;with bed alarm set Nurse Communication: Mobility status PT Visit Diagnosis: Other abnormalities of gait and mobility (R26.89);Muscle weakness (generalized) (M62.81)    Time: 0355-9741 PT Time Calculation (min) (ACUTE ONLY): 25 min   Charges:     PT Treatments $Therapeutic Exercise: 8-22 mins       Ramond Dial 06/10/2019, 1:08 PM  Mee Hives, PT MS Acute Rehab Dept. Number: [place] [facility]

## 2019-06-10 NOTE — Plan of Care (Signed)
  Problem: Elimination: Goal: Will not experience complications related to bowel motility Outcome: Progressing   

## 2019-06-10 NOTE — Consult Note (Addendum)
Cardiology Consultation:   Patient ID: Kara Ortega; [id number]; 1929/09/21   Admit date: 06/06/2019 Date of Consult: 06/10/2019  Primary Care Provider: Carol Ada, MD Primary Cardiologist: Candee Furbish, MD Primary Electrophysiologist:  None    Patient Profile:   Kara Ortega is a 86 y.o. female with a PMH of of paroxysmal atrial fibrillation on eliquis, chronic diastolic CHF, HTN, HLD, DM type 2 on insulin, OSA, CVA with left-sided hemiparesis, depression, and dementia who is being seen today for the evaluation of mitral valve vegetation at the request of Dr. Remi Haggard.  History of Present Illness:   Kara Ortega was in her usual state of health until 06/06/19 when she presented with AMS and generalized weakness. She was found to have a UA c/f UTI and AKI. Lactate was elevated. She was admitted for toxic/metabolic encephalopathy 2/2 dehydration and UTI. Blood cultures were subsequently positive for enterococcus. She was started on IV antibiotics. ID consulted recommending TTE which revealed EF 65-70%, no RWMA, mild concentric LVH, G1DD, severe LAE, severe MAC, mild MR/MS, small mobile density on the left atrial side (noted to have been present on echo 03/2017), and moderate AS. Palliative care was consulted for [place] discussion and family agreed with DNR status per patients previous discussion with Dr. Marlou Porch 05/2018. It appears there is a family meeting scheduled for 06/11/19 for ongoing [place] discussion with the patient's 3 children. 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   Patient was last evaluated by cardiology at an outpatient visit with Dr. Marlou Porch 05/2018, at which time she was without cardiac complaints, now off her antihypertensive medications with improvement in symptoms. There was a discussion regarding code status and patient expressed wishes to be DNR. No medication changes occurred at that visit and she was recommended to follow-up in 1 year. Since that time she has had  several ED visit for falls and was admitted to the hospital 04/2019 for COVID-19. Family has reported functional decline since diagnosis of COVID-19.   At the time of this evaluation she is resting peacefully in her bed. She is difficult to arouse, though does respond to painful stimuli. RN at bedside reports she's been napping for a while now. When awake she frequently calls out in confusion. No family at bedside. Above information attained from chart review.   Past Medical History:  Diagnosis Date   A-fib ([place])    Abnormal heart rhythms    Alzheimer disease (HCC)    Anginal pain (HCC)    Back pain    CHF (congestive heart failure) (HCC)    Chronic kidney disease    STAGE 3    COPD (chronic obstructive pulmonary disease) (HCC)    Depression    Diabetes (HCC)    insulin dependent   Hypertension    Leg pain    Memory loss    Neuropathy    Shortness of breath    Sleep apnea    uses bipap X12 years.   Stroke [facility])    LEFT SIDE RESIDUAL    Past Surgical History:  Procedure Laterality Date   BREAST LUMPECTOMY     BREAST SURGERY     X4   COLON SURGERY     removed 14 inches of colon   HERNIA REPAIR       Home Medications:  Prior to Admission medications   Medication Sig Start Date End Date Taking? Authorizing Provider  allopurinol (ZYLOPRIM) 100 MG tablet Take 200 mg by mouth daily.    Yes [provider]  apixaban (ELIQUIS) 5 MG TABS tablet Take 1 tablet (5 mg total) by mouth 2 (two) times daily. 02/19/15  Yes Isaiah Serge, NP  Ascorbic Acid (VITAMIN C) 1000 MG tablet Take 1,000 mg by mouth daily.   Yes [provider]  atorvastatin (LIPITOR) 10 MG tablet Take 10 mg by mouth at bedtime.    Yes [provider]  B-D UF III MINI PEN NEEDLES 31G X 5 MM MISC U UTD TO INJECT INSULIN 02/25/15  Yes [provider]  Cholecalciferol (HM VITAMIN D3) 4000 UNITS CAPS Take 1,000 Units by mouth daily.    Yes [provider]   famotidine (PEPCID) 10 MG tablet Take 20 mg by mouth 2 (two) times daily.   Yes [provider]  furosemide (LASIX) 20 MG tablet Take 20 mg by mouth daily.    Yes [provider]  insulin NPH-regular Human (70-30) 100 UNIT/ML injection Inject 16 Units into the skin daily.   Yes [provider]  isosorbide mononitrate (IMDUR) 60 MG 24 hr tablet Take 1 tablet (60 mg total) by mouth daily. 04/27/17  Yes Isaiah Serge, NP  Liraglutide (VICTOZA) 18 MG/3ML SOPN Inject 1.2 mg into the skin daily.    Yes [provider]  nitroGLYCERIN (NITROSTAT) 0.4 MG SL tablet Place 1 tablet (0.4 mg total) under the tongue every 5 (five) minutes as needed for chest pain. 05/19/14  Yes Jerline Pain, MD  pregabalin (LYRICA) 100 MG capsule Take 1 capsule (100 mg total) by mouth 2 (two) times daily. 07/25/16  Yes Pollina, Gwenyth Allegra, MD  spironolactone (ALDACTONE) 25 MG tablet Take 25 mg by mouth 2 (two) times daily.    Yes [provider]  topiramate (TOPAMAX) 25 MG tablet Take 1 tablet (25 mg total) by mouth at bedtime. 11/24/18  Yes Jaffe, Adam R, DO  TOUJEO SOLOSTAR 300 UNIT/ML SOPN Inject 80 mg into the skin at bedtime.  05/22/14  Yes [provider]  vitamin B-12 (CYANOCOBALAMIN) 1000 MCG tablet Take 1,000 mcg by mouth daily.   Yes [provider]  zinc gluconate 50 MG tablet Take 50 mg by mouth daily.   Yes [provider]  ALPRAZolam (XANAX) 0.25 MG tablet Take 1 tablet (0.25 mg total) by mouth every 12 (twelve) hours as needed for anxiety or sleep. Patient not taking: Reported on 06/06/2019 04/21/19   Caren Griffins, MD  Rivastigmine 13.3 MG/24HR PT24 Place 1 patch (13.3 mg total) onto the skin daily. Patient not taking: Reported on 06/06/2019 04/27/15   Pieter Partridge, DO  tiZANidine (ZANAFLEX) 2 MG tablet Take 1 tablet (2 mg total) by mouth every 6 (six) hours as needed for muscle spasms. Patient not taking: Reported on 06/06/2019 04/21/19    Caren Griffins, MD  traMADol (ULTRAM) 50 MG tablet Take 0.5 tablets (25 mg total) by mouth every 8 (eight) hours as needed for moderate pain. Patient not taking: Reported on 06/06/2019 04/21/19   Caren Griffins, MD    Inpatient Medications: Scheduled Meds:  allopurinol  200 mg Oral Daily   apixaban  5 mg Oral BID   atorvastatin  10 mg Oral QHS   Chlorhexidine Gluconate Cloth  6 each Topical Q0600   famotidine  20 mg Oral BID   insulin aspart  0-9 Units Subcutaneous TID WC   isosorbide mononitrate  60 mg Oral Daily   mupirocin ointment  1 application Nasal BID   topiramate  25 mg Oral QHS  vitamin B-12  1,000 mcg Oral Daily   Continuous Infusions:  ampicillin (OMNIPEN) IV 2 g (06/10/19 0837)   PRN Meds: acetaminophen, ALPRAZolam, nitroGLYCERIN, traMADol  Allergies:    Allergies  Allergen Reactions   Avandia [Rosiglitazone] Other (See Comments)    edema   Benazepril Other (See Comments)    unknown   Dilaudid [Hydromorphone Hcl] Other (See Comments)    "does not tolerate strong pain medications" per pt   Fosamax [Alendronate Sodium] Nausea And Vomiting   Talwin [Pentazocine] Nausea And Vomiting   Tetanus Toxoids Other (See Comments)    "allergic to something in the tetanus injection" per patient   Zyrtec [Cetirizine] Other (See Comments)    unknown    Social History:   Social History   Socioeconomic History   Marital status: Single    Spouse name: Not on file   Number of children: 3   Years of education: Not on file   Highest education level: Not on file  Occupational History   Occupation: retired  Tobacco Use   Smoking status: Former Smoker    Packs/day: 1.50    Years: 18.00    Pack years: 27.00    Types: Cigarettes    Quit date: 03/31/1968    Years since quitting: 51.2   Smokeless tobacco: Never Used  Substance and Sexual Activity   Alcohol use: No    Alcohol/week: 0.0 standard drinks   Drug use: No   Sexual activity: Never   Other Topics Concern   Not on file  Social History Narrative   Not sure of hands   Assisted living   Patient drinks coffe   Social Determinants of Health   Financial Resource Strain:    Difficulty of Paying Living Expenses:   Food Insecurity:    Worried About Charity fundraiser in the Last Year:    Arboriculturist in the Last Year:   Transportation Needs:    Film/video editor (Medical):    Lack of Transportation (Non-Medical):   Physical Activity:    Days of Exercise per Week:    Minutes of Exercise per Session:   Stress:    Feeling of Stress :   Social Connections:    Frequency of Communication with Friends and Family:    Frequency of Social Gatherings with Friends and Family:    Attends Religious Services:    Active Member of Clubs or Organizations:    Attends Music therapist:    Marital Status:   Intimate Partner Violence:    Fear of Current or Ex-Partner:    Emotionally Abused:    Physically Abused:    Sexually Abused:     Family History:    Family History  Problem Relation Age of Onset   Dementia Maternal Grandmother    Heart disease Mother    Heart disease Sister    Alzheimer's disease Sister    Dementia Maternal Aunt    Mental illness Other      ROS:  Please see the history of present illness.  Review of Systems  All other systems reviewed and are negative.   All other ROS reviewed and negative.     Physical Exam/Data:   Vitals:   06/09/19 1634 06/09/19 2053 06/10/19 0433 06/10/19 0923  BP: 122/67 (!) 146/91 (!) 139/94 (!) 118/47  Pulse: 75 (!) 104 (!) 105 81  Resp: _0 Temp: 98.1 F (36.7 C) 98 F (36.7 C) 97.9 F (36.6 C) 98.1  F (36.7 C)  TempSrc: Oral Oral Oral Oral  SpO2: 92% 93% 92% 94%  Weight:  80.7 kg    Height:        Intake/Output Summary (Last 24 hours) at 06/10/2019 1057 Last data filed at 06/10/2019 1027 Gross per 24 hour  Intake 760 ml  Output 200 ml  Net 560 ml    Filed Weights   06/08/19 0917 06/09/19 2053  Weight: 80.7 kg 80.7 kg   Body mass index is 31.53 kg/m.  General:  Obese elderly female sleeping in NAD HEENT: atraumatic Neck: no JVD Vascular: No carotid bruits; distal pulses 2+ bilaterally Cardiac:  normal S1, S2; RRR; +murmur, no rubs or gallops Lungs:  clear to auscultation bilaterally, no wheezing, rhonchi or rales  Abd: NABS, soft, obese, nontender, no hepatomegaly Ext: no edema Musculoskeletal:  No deformities, BUE and BLE strength normal and equal; she grimaces with palpation of the RLE though there is no appreciable swelling, redness, or increased warmth. Skin: warm and dry  Neuro:  CNs 2-12 intact, no focal abnormalities noted Psych:  Normal affect   EKG:  The EKG was personally reviewed and demonstrates:  Sinus rhythm with sinus arrhythmia, rate 94 bpm, non-specific ST-T wave abnormalities, no STE/D, no significant change from previous.   Relevant CV Studies: Echocardiogram 06/09/19: 1. Intracavitary gradient. Peak velocity 4.97 m/s. Peak gradient 99 mmHg.  Left ventricular ejection fraction, by estimation, is 65 to 70%. Left  ventricular ejection fraction by PLAX is 73 %. The left ventricle has  hyperdynamic function. The left  ventricle has no regional wall motion abnormalities. There is mild  concentric left ventricular hypertrophy. Left ventricular diastolic  parameters are consistent with Grade I diastolic dysfunction (impaired  relaxation).  2. Right ventricular systolic function is normal. The right ventricular  size is normal. There is normal pulmonary artery systolic pressure.  3. Left atrial size was severely dilated.  4. Severe mitral annular calcification. There is a small mobile density  on the left atrial side. This density was present on the echo dates  04/27/17.. The mitral valve is degenerative. Mild mitral valve  regurgitation. Mild mitral stenosis.  5. The aortic valve is normal in structure.  Aortic valve regurgitation is  mild. Moderate aortic valve stenosis. Aortic valve mean gradient measures  19.0 mmHg. Aortic valve Vmax measures 2.96 m/s.  6. The inferior vena cava is normal in size with greater than 50%  respiratory variability, suggesting right atrial pressure of 3 mmHg.    Laboratory Data:  Chemistry Recent Labs  Lab 06/08/19 0448 06/09/19 0427 06/10/19 0833  NA 140 140 141  K 3.9 4.8 3.3*  CL 110 108 107  CO2 18* 18* 20*  GLUCOSE 108* 133* 157*  BUN 9 8 7*  CREATININE 0.78 0.81 0.78  CALCIUM 8.9 9.4 9.4  GFRNONAA >60 >60 >60  GFRAA >60 >60 >60  ANIONGAP _0 Recent Labs  Lab 06/08/19 0448 06/09/19 0427 06/10/19 0833  PROT 5.6* 5.8* 6.1*  ALBUMIN 2.7* 2.9* 3.0*  AST 28 51* 20  ALT _1 ALKPHOS 59 59 63  BILITOT 0.7 2.1* 0.6   Hematology Recent Labs  Lab 06/08/19 0448 06/09/19 0427 06/10/19 0833  WBC 8.3 9.3 8.0  RBC 4.86 5.14* 4.94  HGB 14.1 15.2* 14.3  HCT 43.9 46.8* 43.5  MCV 90.3 91.1 88.1  MCH 29.0 29.6 28.9  MCHC 32.1 32.5 32.9  RDW 19.2* 19.6* 19.1*  PLT 196 245 234  Cardiac EnzymesNo results for input(s): TROPONINI in the last 168 hours. No results for input(s): TROPIPOC in the last 168 hours.  BNP Recent Labs  Lab 06/08/19 0448  BNP 293.2*    DDimer No results for input(s): DDIMER in the last 168 hours.  Radiology/Studies:  DG Chest Port 1 View  Result Date: 06/06/2019 CLINICAL DATA:  Weakness. Shortness of breath. Altered mental status. History of dementia. EXAM: PORTABLE CHEST 1 VIEW COMPARISON:  Radiographs 05/05/2019 and 04/16/2019. CT 05/05/2019. FINDINGS: 1517 hours. The heart size and mediastinal contours are stable. There is aortic atherosclerosis with aortic valvular and mitral annular calcifications. Left-greater-than-right bibasilar pulmonary opacities have not significantly changed, likely postinflammatory scarring. No superimposed airspace disease, edema, significant pleural effusion or  pneumothorax. Telemetry leads overlie the chest. Stable mild thoracolumbar scoliosis. IMPRESSION: Stable chest with chronic bibasilar opacities, likely postinflammatory scarring. No acute cardiopulmonary process. Electronically Signed   By: Richardean Sale M.D.   On: 06/06/2019 15:35   ECHOCARDIOGRAM COMPLETE  Result Date: 06/09/2019    ECHOCARDIOGRAM REPORT   Patient Name:   ADALEI NOVELL Date of Exam: 06/09/2019 Medical Rec #:  [id number]       Height:       63.0 in Accession #:    [id number]      Weight:       178.0 lb Date of Birth:  01/15/1930       BSA:          1.840 m Patient Age:    44 years        BP:           111/61 mmHg Patient Gender: F               HR:           87 bpm. Exam Location:  Inpatient Procedure: 2D Echo, Cardiac Doppler and Color Doppler Indications:    Bacteremia 790.7 / R78.81  History:        Patient has prior history of Echocardiogram examinations, most                 recent 04/27/2017. CHF, Stroke and COPD, Arrythmias:Atrial                 Fibrillation, Signs/Symptoms:Dyspnea and Chest Pain; Risk                 Factors:Hypertension, Diabetes, Dyslipidemia and Sleep Apnea.                 CKD.  Sonographer:    Tiffany Dance Referring Phys: [id number] [place]  1. Intracavitary gradient. Peak velocity 4.97 m/s. Peak gradient 99 mmHg. Left ventricular ejection fraction, by estimation, is 65 to 70%. Left ventricular ejection fraction by PLAX is 73 %. The left ventricle has hyperdynamic function. The left ventricle has no regional wall motion abnormalities. There is mild concentric left ventricular hypertrophy. Left ventricular diastolic parameters are consistent with Grade I diastolic dysfunction (impaired relaxation).  2. Right ventricular systolic function is normal. The right ventricular size is normal. There is normal pulmonary artery systolic pressure.  3. Left atrial size was severely dilated.  4. Severe mitral annular calcification. There is a small mobile  density on the left atrial side. This density was present on the echo dates 04/27/17.. The mitral valve is degenerative. Mild mitral valve regurgitation. Mild mitral stenosis.  5. The aortic valve is normal in structure. Aortic valve regurgitation is mild. Moderate aortic valve stenosis. Aortic valve mean  gradient measures 19.0 mmHg. Aortic valve Vmax measures 2.96 m/s.  6. The inferior vena cava is normal in size with greater than 50% respiratory variability, suggesting right atrial pressure of 3 mmHg. FINDINGS  Left Ventricle: Intracavitary gradient. Peak velocity 4.97 m/s. Peak gradient 99 mmHg. Left ventricular ejection fraction, by estimation, is 65 to 70%. Left ventricular ejection fraction by PLAX is 73 % The left ventricle has hyperdynamic function. The left ventricle has no regional wall motion abnormalities. The left ventricular internal cavity size was normal in size. There is mild concentric left ventricular hypertrophy. Left ventricular diastolic parameters are consistent with Grade I diastolic dysfunction (impaired relaxation). Right Ventricle: The right ventricular size is normal. No increase in right ventricular wall thickness. Right ventricular systolic function is normal. There is normal pulmonary artery systolic pressure. The tricuspid regurgitant velocity is 2.17 m/s, and  with an assumed right atrial pressure of 3 mmHg, the estimated right ventricular systolic pressure is 19.6 mmHg. Left Atrium: Left atrial size was severely dilated. Right Atrium: Right atrial size was normal in size. Pericardium: There is no evidence of pericardial effusion. Mitral Valve: Severe mitral annular calcification. There is a small mobile density on the left atrial side. This density was present on the echo dates 04/27/17. The mitral valve is degenerative in appearance. Normal mobility of the mitral valve leaflets. Severe mitral annular calcification. Mild mitral valve regurgitation. Mild mitral valve stenosis. MV peak  gradient, 20.2 mmHg. The mean mitral valve gradient is 5.0 mmHg. Tricuspid Valve: The tricuspid valve is normal in structure. Tricuspid valve regurgitation is mild . No evidence of tricuspid stenosis. Aortic Valve: The aortic valve is normal in structure.. There is moderate thickening and moderate calcification of the aortic valve. Aortic valve regurgitation is mild. Moderate aortic stenosis is present. There is moderate thickening of the aortic valve. There is moderate calcification of the aortic valve. Aortic valve mean gradient measures 19.0 mmHg. Aortic valve peak gradient measures 35.2 mmHg. Aortic valve area, by VTI measures 1.41 cm. Pulmonic Valve: The pulmonic valve was normal in structure. Pulmonic valve regurgitation is not visualized. No evidence of pulmonic stenosis. Aorta: The aortic root is normal in size and structure. Venous: The inferior vena cava is normal in size with greater than 50% respiratory variability, suggesting right atrial pressure of 3 mmHg. IAS/Shunts: No atrial level shunt detected by color flow Doppler.  LEFT VENTRICLE PLAX 2D LV EF:         Left ventricular ejection fraction by PLAX is 73 % LVIDd:         2.50 cm LVIDs:         1.50 cm LV PW:         1.29 cm LV IVS:        1.20 cm LVOT diam:     1.90 cm LV SV:         70 LV SV Index:   38 LVOT Area:     2.84 cm  RIGHT VENTRICLE             IVC RV Basal diam:  1.80 cm     IVC diam: 1.60 cm RV S prime:     20.20 cm/s TAPSE (M-mode): 2.0 cm LEFT ATRIUM             Index       RIGHT ATRIUM          Index LA diam:        4.20 cm 2.28 cm/m  RA  Area:     7.29 cm LA Vol (A2C):   62.0 ml 33.69 ml/m RA Volume:   10.70 ml 5.81 ml/m LA Vol (A4C):   85.3 ml 46.35 ml/m LA Biplane Vol: 75.3 ml 40.92 ml/m  AORTIC VALVE AV Area (Vmax):    1.45 cm AV Area (Vmean):   1.50 cm AV Area (VTI):     1.41 cm AV Vmax:           296.50 cm/s AV Vmean:          201.500 cm/s AV VTI:            0.497 m AV Peak Grad:      35.2 mmHg AV Mean Grad:       19.0 mmHg LVOT Vmax:         152.00 cm/s LVOT Vmean:        106.500 cm/s LVOT VTI:          0.246 m LVOT/AV VTI ratio: 0.50  AORTA Ao Root diam: 3.20 cm Ao Asc diam:  3.20 cm MITRAL VALVE                TRICUSPID VALVE MV Area (PHT): 1.38 cm     TR Peak grad:   18.8 mmHg MV Peak grad:  20.2 mmHg    TR Vmax:        217.00 cm/s MV Mean grad:  5.0 mmHg MV Vmax:       2.25 m/s     SHUNTS MV Vmean:      103.0 cm/s   Systemic VTI:  0.25 m MV Decel Time: 549 msec     Systemic Diam: 1.90 cm MV E velocity: 90.00 cm/s MV A velocity: 207.00 cm/s MV E/A ratio:  0.43 Skeet Latch MD Electronically signed by Skeet Latch MD Signature Date/Time: 06/09/2019/3:27:58 PM    Final     Assessment and Plan:   1. Mitral valve mass: patient is here with enterococcus bacteremia likely 2/2 UTI. Echo showed EF 65-70%, no RWMA, mild concentric LVH, G1DD, severe LAE, severe MAC, mild MR/MS, small mobile density on the left atrial side (noted to have been present on echo 03/2017), and moderate AS. ID is following. She is on IV antibiotics. Palliative care is following for ongoing [place] discussion with plans for a family meeting 06/11/19. Reviewed echo findings with Dr. Marlou Porch for a side-by-side comparison who feels this was indeed present in 2019 and like reflects degenerative changes of the severely calcified mitral valve and is NOT reflective of bacterial endocarditis. - No indication for TEE at this time.  - Favor conservative management with IV antibiotics per ID and ongoing [place] discussion  2. Bacteremia: found to have enterococcus bacteremia likely 2/2 UTI. On IV antibiotics per ID.  - Continue management per primary team and ID  3. Acute metabolic encephalopathy: likely 2/2 UTI in patient with dementia - Continue management per primary team  4. Paroxysmal atrial fibrillation: EKG with sinus rhythm this admission. Rates well controlled on telemetry, not on an AV nodal blocking agents - Continue eliquis for stroke ppx -  Continue fall precautions  5. Chronic diastolic CHF: appears euvolemic. I&Os incomplete due to unmeasured urine output occurrences. Weights are stable - Continue to monitor volume status closely - Resume home lasix as needed.   6. Hypokalemia: K 3.3 today - Will replete 40 mEq x2 doses today - Repeat BMET in AM  7. Valvulopathy: noted to have moderate AS, mild MR, and mild MS on  echo this admission.  - Will defer monitoring to Dr. Marlou Porch. Unlikely to be a surgical candidate given advance age/dementia so not sure there's utility in surveillance monitoring going forward.   For questions or updates, please contact [place] Please consult [email or web address] for contact info under Cardiology/STEMI.   Signed, Abigail Butts, PA-C  06/10/2019 10:57 AM [phone]  Personally seen and examined. Agree with above.   86 year old with multiple medical comorbidities, DNR with toxic encephalopathy enterococcal UTI.  We were consulted because of small mitral valve mobile mass seen.  I personally reviewed both echocardiograms, current and 2019, there is no change in mitral valve degeneration with associated severe mitral annular calcification.  This does not appear to be a vegetation.  No further cardiac work-up is necessary.  She is confused, diagonal in bed, somewhat shaky, nonverbal, elderly.  Continue to encourage palliative care.  I would continue to provide conservative care as it relates to her cardiac conditions.  I do not think that she needs any further echocardiograms.  I have seen her previously in clinic.  We had lengthy DNR discussion with her family previously.  Please let us know if we can be of further assistance.  We will go ahead and sign off.  Candee Furbish, MD

## 2019-06-10 NOTE — Progress Notes (Signed)
Daily Progress Note   Patient Name: Kara Ortega       Date: 06/10/2019 DOB: 22-Apr-1929  Age: 86 y.o. MRN#: [id number] Attending Physician: Aline August, MD Primary Care Physician: Carol Ada, MD Admit Date: 06/06/2019  Reason for Consultation/Follow-up: Establishing goals of care and Psychosocial/spiritual support  Subjective: Patient opens her eyes after speaking with her for a bit and examining her.  Otherwise she does not follow commands and does not speak with me.   Called her son [name] on the phone to see if she would respond to his voice.  She did not, but rather rested peacefully.  Discussed with RN.  Patient will take medications by mouth but is not eating or drinking otherwise.  Discussed with Dr. Baxter Flattery.  Patient is likely a poor candidate for TEE.  Endocarditis is likely.  Need to determine what patient and family's goals are regarding placing a PICC and initiating long term antibiotic therapy.  Spoke with [name] on the phone.  We established a Zoom call for 3/13 at 10:00 with all 3 of her children to discuss (1) Is she thriving - eating, functioning, responding?  (2) Is she a good candidate for PICC placement and long term antibiotic therapy?  (3) If her oral intake does not improve is a feeding tube in line with her wishes?  (4) discussion of what Hospice services offer and when they may be appropriate.  Assessment: Patient with advanced dementia, multiple falls, CVA, CKD3, HF, COVID in Jan.  Now with UTI and bacteremia.  Poor PO intake.  2+ max assist.  Unable to stand.   Patient Profile/HPI:  86 y.o. female  with past medical history of recent [place] hospitalization dc'd 1/21 to the [facility], Alzheimer's dementia, CKD3, OSA on CPAP, admitted on 06/06/2019 with altered  mental status and findings of dehydration, UTI, and bacteremia- etiology unclear, TEE pending for possible endocarditis.  Palliative medicine consulted for [place].    Length of Stay: 4  Current Medications: Scheduled Meds:  . allopurinol  200 mg Oral Daily  . apixaban  5 mg Oral BID  . atorvastatin  10 mg Oral QHS  . Chlorhexidine Gluconate Cloth  6 each Topical Q0600  . famotidine  20 mg Oral BID  . insulin aspart  0-9 Units Subcutaneous TID WC  . isosorbide mononitrate  60 mg Oral Daily  . mupirocin ointment  1 application Nasal BID  . topiramate  25 mg Oral QHS  . vitamin B-12  1,000 mcg Oral Daily    Continuous Infusions: . ampicillin (OMNIPEN) IV 2 g (06/10/19 0837)    PRN Meds: acetaminophen, ALPRAZolam, nitroGLYCERIN, traMADol  Physical Exam       Well developed elderly female, asleep, will open eyes after some interaction and provoking. Non verbal.  CV rrr  Resp no distress Abdomen soft, nt,nd, +bs  Vital Signs: BP (!) 118/47 (BP Location: Right Arm)   Pulse 81   Temp 98.1 F (36.7 C) (Oral)   Resp 18   Ht 5\' 3"  (1.6 m)   Wt 80.7 kg   SpO2 94%   BMI 31.53 kg/m  SpO2: SpO2: 94 % O2 Device: O2 Device: Room Air O2 Flow Rate:    Intake/output summary:   Intake/Output Summary (Last 24 hours) at 06/10/2019 1052 Last data filed at 06/10/2019 1027 Gross per 24 hour  Intake 760 ml  Output 200 ml  Net 560 ml   LBM: Last BM Date: 06/10/19 Baseline Weight: Weight: 80.7 kg Most recent weight: Weight: 80.7 kg       Palliative Assessment/Data: 20%      Patient Active Problem List   Diagnosis Date Noted  . Pressure injury of skin 06/10/2019  . Altered mental status   . Bacteremia due to Enterococcus 06/08/2019  . Advanced care planning/counseling discussion   . Goals of care, counseling/discussion   . Palliative care by specialist   . Acute lower UTI 06/06/2019  . Lactic acidosis 06/06/2019  . UTI (urinary tract infection) 06/06/2019  . Dementia  without behavioral disturbance ([place])   . OSA on CPAP   . Palliative care encounter   . Acute respiratory failure due to COVID-19 ([place]) 04/13/2019  . Essential tremor 09/14/2014  . High cholesterol 05/19/2014  . Persistent atrial fibrillation ([place]) 05/19/2014  . Long-term use of high-risk medication 05/19/2014  . Accident due to mechanical fall without injury 03/24/2014  . Diastolic dysfunction with chronic heart failure ([place]) 03/24/2014  . Sinus bradycardia 03/24/2014  . Hypotension 03/24/2014  . OSA (obstructive sleep apnea) 03/24/2014  . Warfarin anticoagulation 03/24/2014  . Aortic stenosis 03/24/2014  . Mitral stenosis 03/24/2014  . DOE (dyspnea on exertion) 03/24/2014  . Recurrent falls 03/24/2014  . AKI (acute kidney injury) ([place]) 03/23/2014  . Alzheimer's dementia ([place]) 12/26/2013  . Unstable angina ([place]) 09/14/2013  . Diarrhea 09/14/2013  . Chest pain at rest- most likely GI related 09/14/2013  . CHF (congestive heart failure) ([place])   . A-fib ([place])   . Diabetes ([place])   . Dyspnea on exertion 03/25/2013    Palliative Care Plan    Recommendations/Plan:  Continue current care.  Speaking with 3 children tomorrow at 10:00.  Would be interested to know if Cards feels she is a candidate for TEE.  PMT will continue to follow with you.  Goals of Care and Additional Recommendations:  Limitations on Scope of Treatment: Full Scope Treatment  Code Status:  DNR  Prognosis:  Pending goals of care discussion.  If she shifted to comfort at this point her prognosis would be weeks.  Discharge Planning:  To Be Determined  Care plan was discussed with Son, [name]  Thank you for allowing the Palliative Medicine Team to assist in the care of this patient.  Total time spent:  35 min.     Greater than 50%  of this  time was spent counseling and coordinating care related to the above assessment and plan.  Florentina Jenny, PA-C Palliative Medicine  Please contact  Palliative MedicineTeam phone at [phone] for questions and concerns between 7 am - 7 pm.   Please see AMION for individual provider pager numbers.

## 2019-06-11 ENCOUNTER — Inpatient Hospital Stay: Payer: Self-pay

## 2019-06-11 LAB — COMPREHENSIVE METABOLIC PANEL
ALT: 17 U/L (ref 0–44)
AST: 21 U/L (ref 15–41)
Albumin: 2.9 g/dL — ABNORMAL LOW (ref 3.5–5.0)
Alkaline Phosphatase: 56 U/L (ref 38–126)
Anion gap: 13 (ref 5–15)
BUN: 6 mg/dL — ABNORMAL LOW (ref 8–23)
CO2: 17 mmol/L — ABNORMAL LOW (ref 22–32)
Calcium: 9 mg/dL (ref 8.9–10.3)
Chloride: 112 mmol/L — ABNORMAL HIGH (ref 98–111)
Creatinine, Ser: 0.88 mg/dL (ref 0.44–1.00)
GFR calc Af Amer: >60 mL/min (ref >60)
GFR calc non Af Amer: 59 mL/min — ABNORMAL LOW (ref >60)
Glucose, Bld: 167 mg/dL — ABNORMAL HIGH (ref 70–99)
Potassium: 4.2 mmol/L (ref 3.5–5.1)
Sodium: 142 mmol/L (ref 135–145)
Total Bilirubin: 0.8 mg/dL (ref 0.3–1.2)
Total Protein: 5.6 g/dL — ABNORMAL LOW (ref 6.5–8.1)

## 2019-06-11 LAB — CULTURE, BLOOD (ROUTINE X 2): Culture: NO GROWTH

## 2019-06-11 LAB — CBC WITH DIFFERENTIAL/PLATELET
Abs Immature Granulocytes: 0.07 10*3/uL (ref 0.00–0.07)
Basophils Absolute: 0 10*3/uL (ref 0.0–0.1)
Basophils Relative: 0 %
Eosinophils Absolute: 0.2 10*3/uL (ref 0.0–0.5)
Eosinophils Relative: 2 %
HCT: 42.7 % (ref 36.0–46.0)
Hemoglobin: 14 g/dL (ref 12.0–15.0)
Immature Granulocytes: 1 %
Lymphocytes Relative: 19 %
Lymphs Abs: 1.7 10*3/uL (ref 0.7–4.0)
MCH: 29.3 pg (ref 26.0–34.0)
MCHC: 32.8 g/dL (ref 30.0–36.0)
MCV: 89.3 fL (ref 80.0–100.0)
Monocytes Absolute: 0.8 10*3/uL (ref 0.1–1.0)
Monocytes Relative: 9 %
Neutro Abs: 6.1 10*3/uL (ref 1.7–7.7)
Neutrophils Relative %: 69 %
Platelets: 240 10*3/uL (ref 150–400)
RBC: 4.78 MIL/uL (ref 3.87–5.11)
RDW: 19.3 % — ABNORMAL HIGH (ref 11.5–15.5)
WBC: 8.9 10*3/uL (ref 4.0–10.5)
nRBC: 0 % (ref 0.0–0.2)

## 2019-06-11 LAB — GLUCOSE, CAPILLARY
Glucose-Capillary: 164 mg/dL — ABNORMAL HIGH (ref 70–99)
Glucose-Capillary: 144 mg/dL — ABNORMAL HIGH (ref 70–99)
Glucose-Capillary: 151 mg/dL — ABNORMAL HIGH (ref 70–99)
Glucose-Capillary: 113 mg/dL — ABNORMAL HIGH (ref 70–99)

## 2019-06-11 LAB — MAGNESIUM: Magnesium: 1.4 mg/dL — ABNORMAL LOW (ref 1.7–2.4)

## 2019-06-11 MED ORDER — MAGNESIUM SULFATE 2 GM/50ML IV SOLN
2.0000 g | Freq: Once | INTRAVENOUS | Status: AC
  Administered 2019-06-11: 2 g via INTRAVENOUS
  Filled 2019-06-11: qty 50

## 2019-06-11 NOTE — Progress Notes (Signed)
Secure chat with Dr Starla Link and spoke with RN.  Holding on PICC order due to future needs/palliative care/speaking with family.

## 2019-06-11 NOTE — Social Work (Signed)
CSW reached out to Durenda Age to inquire patient's return to facility with 6 weeks of IV. CSW was not able to reach anyone, will follow-up.   Katryn Plummer Riddick,CSW

## 2019-06-11 NOTE — Progress Notes (Addendum)
Daily Progress Note   Patient Name: Kara Ortega       Date: 06/11/2019 DOB: Jan 28, 1930  Age: 86 y.o. MRN#: [id number] Attending Physician: Aline August, MD Primary Care Physician: Carol Ada, MD Admit Date: 06/06/2019  Reason for Consultation/Follow-up: Establishing goals of care  Subjective: Long discussion had at patient's bedside over Zoom with her 3 children - Christean Leaf and [name].  We briefly discussed her PMH as well as her current infection and clinical symptoms.  I encouraged her family to consider what she would want.  The family asked excellent questions - what would recovery look like? Explain what a PICC is and the risks involved?  What are the risks and benefits of antibiotics?  [name] discussed his mother's living will - She specifically stated that at end of life she does not want artificial hydration or nutrition.  She wants to be comfortable and does not want her life to be prolonged by artificial measures.  She checked the line item that indicates this living will is to be strictly followed.  More discussion.  The family does not want her to suffer.  Her comfort is of the utmost importance.  However they want to give her every opportunity to improve. Our Zoom call ended (on its own!).    Discussed case with MDs involved, and then called Rick back.  Explained that one path is to initiate 6 weeks of IV antibiotics and a method for artificial nutrition.  The other patient is comfort and hospice house.    The family opted for a time limited trial.  If she has not improved (not eating and drinking)by Monday then the family will request Hospice House for EOL care on Tuesday morning.  However if she is improved (is eating and drinking some)  then they will move forward with  PICC and IV antibiotics.  Comfort is a priority.  If she becomes uncomfortable between now and Monday the family will reassess and consider a change of plans.   Assessment: Patient not following commands, very lethargic, not taking any PO.   Patient Profile/HPI:  86 y.o. female  with past medical history of recent [place] hospitalization dc'd 1/21 to the [facility], Alzheimer's dementia, CKD3, OSA on CPAP, admitted on 06/06/2019 with altered mental status and findings of dehydration, UTI, and bacteremia- etiology unclear,  TEE pending for possible endocarditis.  Palliative medicine consulted for [place].    Length of Stay: 5  Current Medications: Scheduled Meds:  . allopurinol  200 mg Oral Daily  . apixaban  5 mg Oral BID  . atorvastatin  10 mg Oral QHS  . Chlorhexidine Gluconate Cloth  6 each Topical Q0600  . famotidine  20 mg Oral BID  . insulin aspart  0-9 Units Subcutaneous TID WC  . isosorbide mononitrate  60 mg Oral Daily  . mupirocin ointment  1 application Nasal BID  . topiramate  25 mg Oral QHS  . vitamin B-12  1,000 mcg Oral Daily    Continuous Infusions: . ampicillin (OMNIPEN) IV 2 g (06/11/19 1056)  . cefTRIAXone (ROCEPHIN)  IV 2 g (06/11/19 0840)    PRN Meds: acetaminophen, ALPRAZolam, nitroGLYCERIN, traMADol  Physical Exam       Elderly female, sleeping, will not open eyes or verbalize.  CV rrr resp no distress Abdomen obese, soft, nt, nd  Vital Signs: BP (!) 142/90 (BP Location: Right Leg)   Pulse (!) 102   Temp 98 F (36.7 C) (Oral)   Resp 16   Ht 5\' 3"  (1.6 m)   Wt 80.7 kg   SpO2 98%   BMI 31.53 kg/m  SpO2: SpO2: 98 % O2 Device: O2 Device: Room Air O2 Flow Rate:    Intake/output summary:   Intake/Output Summary (Last 24 hours) at 06/11/2019 1407 Last data filed at 06/11/2019 0900 Gross per 24 hour  Intake 702.09 ml  Output 551 ml  Net 151.09 ml   LBM: Last BM Date: 06/10/19 Baseline Weight: Weight: 80.7 kg Most recent weight: Weight: 80.7 kg        Palliative Assessment/Data: 10%      Patient Active Problem List   Diagnosis Date Noted  . Pressure injury of skin 06/10/2019  . Altered mental status   . Bacteremia due to Enterococcus 06/08/2019  . Advanced care planning/counseling discussion   . Goals of care, counseling/discussion   . Palliative care by specialist   . Acute lower UTI 06/06/2019  . Lactic acidosis 06/06/2019  . UTI (urinary tract infection) 06/06/2019  . Dementia without behavioral disturbance ([place])   . OSA on CPAP   . Palliative care encounter   . Acute respiratory failure due to COVID-19 ([place]) 04/13/2019  . Essential tremor 09/14/2014  . High cholesterol 05/19/2014  . Persistent atrial fibrillation ([place]) 05/19/2014  . Long-term use of high-risk medication 05/19/2014  . Accident due to mechanical fall without injury 03/24/2014  . Diastolic dysfunction with chronic heart failure ([place]) 03/24/2014  . Sinus bradycardia 03/24/2014  . Hypotension 03/24/2014  . OSA (obstructive sleep apnea) 03/24/2014  . Warfarin anticoagulation 03/24/2014  . Aortic stenosis 03/24/2014  . Mitral stenosis 03/24/2014  . DOE (dyspnea on exertion) 03/24/2014  . Recurrent falls 03/24/2014  . AKI (acute kidney injury) ([place]) 03/23/2014  . Alzheimer's dementia ([place]) 12/26/2013  . Unstable angina ([place]) 09/14/2013  . Diarrhea 09/14/2013  . Chest pain at rest- most likely GI related 09/14/2013  . CHF (congestive heart failure) ([place])   . A-fib ([place])   . Diabetes ([place])   . Dyspnea on exertion 03/25/2013    Palliative Care Plan    Recommendations/Plan:  Patient stays HOT.  Please do not cover up.  Continue current care for now.  Including feeding assistance.  The family opted for a time limited trial.  If she has not improved (not eating and drinking)by Monday then the  family will request Hospice House.   However if she is improved (is eating and drinking some)  then they will move forward with PICC and IV antibiotics.   PMT Family meeting scheduled Monday 3/15 at 4:00 pm  Comfort is a priority.  If she becomes uncomfortable between now and Monday the family will reassess and consider a change of plans.  Goals of Care and Additional Recommendations:  Limitations on Scope of Treatment: Full Scope Treatment  Code Status:  DNR  Prognosis:   < 2 weeks after artificial hydration is stopped.   Discharge Planning:  To Be Determined.  Hospice House vs SNF with IV antibiotics and PEG  Care plan was discussed with Family and attending physician  Thank you for allowing the Palliative Medicine Team to assist in the care of this patient.  Total time spent:  75 min Time in 10:00 Time out 11:15     Greater than 50%  of this time was spent counseling and coordinating care related to the above assessment and plan.  Florentina Jenny, PA-C Palliative Medicine  Please contact Palliative MedicineTeam phone at [phone] for questions and concerns between 7 am - 7 pm.   Please see AMION for individual provider pager numbers.

## 2019-06-11 NOTE — Progress Notes (Signed)
Patient ID: Kambria Grima, female   DOB: 1929-08-28, 86 y.o.   MRN: [id number]  PROGRESS NOTE    Tiernan Millikin  JJH:[id number] DOB: Jan 11, 1930 DOA: 06/06/2019 PCP: Carol Ada, MD   Brief Narrative:  86 year old female with history of permanent A. fib, chronic kidney disease stage II, advanced dementia, diabetes mellitus type 2 on insulin, hypertension, nonhemorrhagic stroke with left-sided hemiparesis, Covid infection in January 2021 presented from memory care nursing facility with hallucinations, generalized weakness, decreased responsiveness and dark/cloudy urine and admitted for dehydration, AKI, toxic encephalopathy due to UTI and started on IV antibiotics and fluids.  She was found to have Enterococcus faecalis bacteremia for which ID was consulted.  2D echo showed questionable mobile density on the left atrial side for which cardiology was also consulted.  Palliative care was also consulted.  Assessment & Plan:   Enterococcus faecalis bacteremia: Present on admission UTI -Continue ampicillin. Repeat blood cultures negative so far.   -2D echo showed EF of 65 to 70% with grade 1 diastolic dysfunction and a small mobile density on the left atrial side, which was present on the echo date 04/27/2017 apparently.  Cardiology has evaluated the patient and think that this is not a vegetation and probably is severe mitral annular calcification and recommend no further cardiac work-up.  Cardiology recommends to continue to encourage palliative care.  Cardiology has signed off -ID has recommended 6 weeks of IV Rocephin and ampicillin till 07/20/2019.  ID recommends to have PICC line placed today.  ID has signed off.  Outpatient follow-up with ID.   Acute metabolic encephalopathy: Most likely secondary to dehydration and UTI History of dementia  -Monitor mental status.  Mental status probably back to her baseline. -Fall precautions  Dehydration Acute kidney injury -From above and poor oral  intake.  Treated with IV fluids.  Off IV fluids. -Encourage oral intake. -Acute kidney injury has resolved  Hypomagnesemia -Replace.  Repeat a.m. labs  Permanent A. Fib -[place] VASC 2 Score of > 4  -Rate controlled.  Continue Eliquis.  History of nonhemorrhagic stroke with mild left-sided weakness -Stable.  Continue Eliquis along with statin  Hypertension  -Blood pressure intermittently elevated.  Continue Imdur.  Might have to resume diuretics on discharge.  Dyslipidemia -Continue statin  History of gout  -continue allopurinol  Chronic diastolic CHF with EF of 81% -Currently compensated.  Might have to resume diuretics on discharge.  Strict input and output.  Daily weights.  Diabetes mellitus type 2 -A1c 8.1.  Continue CBGs with SSI.  Carb modified diet  Generalized deconditioning -PT recommends SNF placement.  Social worker following.  Overall prognosis is guarded to poor.  Palliative care evaluation appreciated.  CODE STATUS has been changed to DNR.  Palliative care is planning to have family meeting today.  DVT prophylaxis: Eliquis Code Status: DNR Family Communication: Spoke to son/Richard [place] on phone on 06/10/2019 Disposition Plan: will need PICC line placement today.  Subsequently patient will be ready for discharge to SNF once bed is available.  She will need 6 weeks of IV Rocephin and ampicillin. Consultants: ID/palliative care/cardiology  Procedures:  Echo IMPRESSIONS    1. Intracavitary gradient. Peak velocity 4.97 m/s. Peak gradient 99 mmHg.  Left ventricular ejection fraction, by estimation, is 65 to 70%. Left  ventricular ejection fraction by PLAX is 73 %. The left ventricle has  hyperdynamic function. The left  ventricle has no regional wall motion abnormalities. There is mild  concentric left ventricular hypertrophy. Left ventricular diastolic  parameters  are consistent with Grade I diastolic dysfunction (impaired  relaxation).  2. Right  ventricular systolic function is normal. The right ventricular  size is normal. There is normal pulmonary artery systolic pressure.  3. Left atrial size was severely dilated.  4. Severe mitral annular calcification. There is a small mobile density  on the left atrial side. This density was present on the echo dates  04/27/17.. The mitral valve is degenerative. Mild mitral valve  regurgitation. Mild mitral stenosis.  5. The aortic valve is normal in structure. Aortic valve regurgitation is  mild. Moderate aortic valve stenosis. Aortic valve mean gradient measures  19.0 mmHg. Aortic valve Vmax measures 2.96 m/s.  6. The inferior vena cava is normal in size with greater than 50%  respiratory variability, suggesting right atrial pressure of 3 mmHg.   Antimicrobials:  Anti-infectives (From admission, onward)   Start     Dose/Rate Route Frequency Ordered Stop   06/10/19 1330  cefTRIAXone (ROCEPHIN) 2 g in sodium chloride 0.9 % 100 mL IVPB     2 g 200 mL/hr over 30 Minutes Intravenous Every 12 hours 06/10/19 1247     06/08/19 1600  ampicillin (OMNIPEN) 2 g in sodium chloride 0.9 % 100 mL IVPB     2 g 300 mL/hr over 20 Minutes Intravenous Every 6 hours 06/08/19 1144     06/08/19 0845  ampicillin (OMNIPEN) 2 g in sodium chloride 0.9 % 100 mL IVPB     2 g 300 mL/hr over 20 Minutes Intravenous  Once 06/08/19 0835 06/08/19 1032   06/07/19 1630  cefTRIAXone (ROCEPHIN) 2 g in sodium chloride 0.9 % 100 mL IVPB  Status:  Discontinued     2 g 200 mL/hr over 30 Minutes Intravenous Every 24 hours 06/07/19 0953 06/08/19 0834   06/06/19 1630  cefTRIAXone (ROCEPHIN) 1 g in sodium chloride 0.9 % 100 mL IVPB  Status:  Discontinued     1 g 200 mL/hr over 30 Minutes Intravenous  Once 06/06/19 1619 06/06/19 1626   06/06/19 1630  cefTRIAXone (ROCEPHIN) 1 g in sodium chloride 0.9 % 100 mL IVPB  Status:  Discontinued     1 g 200 mL/hr over 30 Minutes Intravenous Every 24 hours 06/06/19 1620 06/07/19 0953        Subjective: Patient seen and examined at bedside.  Poor historian.  Sleepy, wakes up slightly, and answers any questions.  No overnight fever or vomiting reported.   Objective: Vitals:   06/10/19 2043 06/10/19 2310 06/11/19 0354 06/11/19 0429  BP: (!) 141/111 101/87 139/88   Pulse: (!) 47 (!) 101 (!) 118 96  Resp: 17  16   Temp: 98.4 F (36.9 C)  98 F (36.7 C)   TempSrc: Oral  Oral   SpO2: 95% 92% 95% 96%  Weight:      Height:        Intake/Output Summary (Last 24 hours) at 06/11/2019 0737 Last data filed at 06/11/2019 0548 Gross per 24 hour  Intake 782.09 ml  Output 751 ml  Net 31.09 ml   Filed Weights   06/08/19 0917 06/09/19 2053  Weight: 80.7 kg 80.7 kg    Examination:  General exam: No distress.  Elderly female.  Sleepy, wakes up slightly, hardly answers any questions.  Poor historian.   Respiratory system: Bilateral decreased breath sounds at bases with some crackles.   Cardiovascular system: S1-S2 heard, mild intermittent tachycardia Gastrointestinal system: Abdomen is nondistended, soft and nontender.  Normal bowel sounds heard  extremities:  No clubbing; trace bilateral lower extremity edema present   Data Reviewed: I have personally reviewed following labs and imaging studies  CBC: Recent Labs  Lab 06/06/19 1410 06/06/19 1410 06/07/19 0509 06/08/19 0448 06/09/19 0427 06/10/19 0833 06/11/19 0526  WBC 9.2   < > 9.4 8.3 9.3 8.0 8.9  NEUTROABS 6.5  --   --  5.5 6.2 5.4 6.1  HGB 15.3*   < > 13.2 14.1 15.2* 14.3 14.0  HCT 47.1*   < > 40.5 43.9 46.8* 43.5 42.7  MCV 90.4   < > 91.0 90.3 91.1 88.1 89.3  PLT 242   < > 207 196 245 234 240   < > = values in this interval not displayed.   Basic Metabolic Panel: Recent Labs  Lab 06/07/19 0509 06/08/19 0448 06/09/19 0427 06/10/19 0833 06/11/19 0526  NA 140 140 140 141 142  K 3.0* 3.9 4.8 3.3* 4.2  CL 105 110 108 107 112*  CO2 21* 18* 18* 20* 17*  GLUCOSE 65* 108* 133* 157* 167*  BUN 14 9 8  7* 6*   CREATININE 0.79 0.78 0.81 0.78 0.88  CALCIUM 8.3* 8.9 9.4 9.4 9.0  MG  --  1.4* 1.5* 1.7 1.4*   GFR: Estimated Creatinine Clearance: 45.3 mL/min (by C-G formula based on SCr of 0.88 mg/dL). Liver Function Tests: Recent Labs  Lab 06/06/19 1410 06/08/19 0448 06/09/19 0427 06/10/19 0833 06/11/19 0526  AST 26 28 51* 20 21  ALT 21 23 16 20 17   ALKPHOS 63 59 59 63 56  BILITOT 0.9 0.7 2.1* 0.6 0.8  PROT 6.0* 5.6* 5.8* 6.1* 5.6*  ALBUMIN 3.2* 2.7* 2.9* 3.0* 2.9*   No results for input(s): LIPASE, AMYLASE in the last 168 hours. No results for input(s): AMMONIA in the last 168 hours. Coagulation Profile: No results for input(s): INR, PROTIME in the last 168 hours. Cardiac Enzymes: No results for input(s): CKTOTAL, CKMB, CKMBINDEX, TROPONINI in the last 168 hours. BNP (last 3 results) No results for input(s): PROBNP in the last 8760 hours. HbA1C: No results for input(s): HGBA1C in the last 72 hours. CBG: Recent Labs  Lab 06/10/19 0700 06/10/19 1124 06/10/19 1619 06/10/19 2045 06/11/19 0728  GLUCAP 156* 158* 167* 155* 164*   Lipid Profile: No results for input(s): CHOL, HDL, LDLCALC, TRIG, CHOLHDL, LDLDIRECT in the last 72 hours. Thyroid Function Tests: No results for input(s): TSH, T4TOTAL, FREET4, T3FREE, THYROIDAB in the last 72 hours. Anemia Panel: No results for input(s): VITAMINB12, FOLATE, FERRITIN, TIBC, IRON, RETICCTPCT in the last 72 hours. Sepsis Labs: Recent Labs  Lab 06/06/19 1410 06/06/19 1645 06/07/19 1027 06/08/19 0448 06/09/19 0427  PROCALCITON  --   --  <0.10 <0.10 <0.10  LATICACIDVEN 3.4* 2.8*  --   --   --     Recent Results (from the past 240 hour(s))  Urine Culture     Status: Abnormal   Collection Time: 06/06/19  3:03 PM   Specimen: Urine, Catheterized  Result Value Ref Range Status   Specimen Description URINE, CATHETERIZED  Final   Special Requests   Final    ADDED 2253 Performed at [facility], [place] [address], [place],  [place] [address]    Culture 70,000 COLONIES/mL PROTEUS MIRABILIS (A)  Final   Report Status 06/09/2019 FINAL  Final   Organism ID, Bacteria PROTEUS MIRABILIS (A)  Final      Susceptibility   Proteus mirabilis - MIC*    AMPICILLIN <=2 SENSITIVE Sensitive  CEFAZOLIN 8 SENSITIVE Sensitive     CEFTRIAXONE <=0.25 SENSITIVE Sensitive     CIPROFLOXACIN >=4 RESISTANT Resistant     GENTAMICIN <=1 SENSITIVE Sensitive     IMIPENEM 2 SENSITIVE Sensitive     NITROFURANTOIN 128 RESISTANT Resistant     TRIMETH/SULFA >=320 RESISTANT Resistant     AMPICILLIN/SULBACTAM <=2 SENSITIVE Sensitive     PIP/TAZO <=4 SENSITIVE Sensitive     * 70,000 COLONIES/mL PROTEUS MIRABILIS  Blood Culture (routine x 2)     Status: Abnormal   Collection Time: 06/06/19  3:20 PM   Specimen: BLOOD  Result Value Ref Range Status   Specimen Description BLOOD RIGHT ANTECUBITAL  Final   Special Requests   Final    BOTTLES DRAWN AEROBIC AND ANAEROBIC Blood Culture results may not be optimal due to an inadequate volume of blood received in culture bottles   Culture  Setup Time   Final    GRAM POSITIVE COCCI IN CHAINS ANAEROBIC BOTTLE ONLY CRITICAL RESULT CALLED TO, READ BACK BY AND VERIFIED WITH: Lorita Officer  [place] [id number] [place] Performed at [facility], [place] [address] [place], [place] [address]    Culture ENTEROCOCCUS FAECALIS (A)  Final   Report Status 06/09/2019 FINAL  Final   Organism ID, Bacteria ENTEROCOCCUS FAECALIS  Final      Susceptibility   Enterococcus faecalis - MIC*    AMPICILLIN <=2 SENSITIVE Sensitive     VANCOMYCIN 1 SENSITIVE Sensitive     GENTAMICIN SYNERGY SENSITIVE Sensitive     * ENTEROCOCCUS FAECALIS  Blood Culture (routine x 2)     Status: None (Preliminary result)   Collection Time: 06/06/19  3:30 PM   Specimen: BLOOD  Result Value Ref Range Status   Specimen Description BLOOD LEFT ANTECUBITAL  Final   Special Requests   Final    BOTTLES DRAWN AEROBIC AND ANAEROBIC Blood Culture  results may not be optimal due to an inadequate volume of blood received in culture bottles   Culture   Final    NO GROWTH 4 DAYS Performed at [facility], [place] [address] [place], [place] [address]    Report Status PENDING  Incomplete  SARS CORONAVIRUS 2 (TAT 6-24 HRS) Nasopharyngeal Nasopharyngeal Swab     Status: None   Collection Time: 06/06/19  5:55 PM   Specimen: Nasopharyngeal Swab  Result Value Ref Range Status   SARS Coronavirus 2 NEGATIVE NEGATIVE Final    Comment: (NOTE) SARS-CoV-2 target nucleic acids are NOT DETECTED. The SARS-CoV-2 RNA is generally detectable in upper and lower respiratory specimens during the acute phase of infection. Negative results do not preclude SARS-CoV-2 infection, do not rule out co-infections with other pathogens, and should not be used as the sole basis for treatment or other patient management decisions. Negative results must be combined with clinical observations, patient history, and epidemiological information. The expected result is Negative. Fact Sheet for Patients: SugarRoll.be Fact Sheet for Healthcare Providers: https://www.woods-mathews.com/ This test is not yet approved or cleared by the [place] FDA and  has been authorized for detection and/or diagnosis of SARS-CoV-2 by FDA under an Emergency Use Authorization (EUA). This EUA will remain  in effect (meaning this test can be used) for the duration of the COVID-19 declaration under Section 56 4(b)(1) of the Act, 21 U.S.C. section 360bbb-3(b)(1), unless the authorization is terminated or revoked sooner. Performed at [facility], [place] [address], [place], [place] [address]   Culture, blood (routine x 2)  Status: None (Preliminary result)   Collection Time: 06/07/19 10:27 AM   Specimen: BLOOD  Result Value Ref Range Status   Specimen Description BLOOD RIGHT ANTECUBITAL  Final   Special Requests   Final    BOTTLES  DRAWN AEROBIC AND ANAEROBIC Blood Culture results may not be optimal due to an inadequate volume of blood received in culture bottles   Culture   Final    NO GROWTH 3 DAYS Performed at [facility], [place] [address] [place], [place] [address]    Report Status PENDING  Incomplete  Culture, blood (routine x 2)     Status: Abnormal   Collection Time: 06/07/19 10:28 AM   Specimen: BLOOD RIGHT ARM  Result Value Ref Range Status   Specimen Description BLOOD RIGHT ARM  Final   Special Requests   Final    BOTTLES DRAWN AEROBIC ONLY Blood Culture results may not be optimal due to an inadequate volume of blood received in culture bottles   Culture  Setup Time   Final    GRAM POSITIVE COCCI IN CHAINS AEROBIC BOTTLE ONLY CRITICAL RESULT CALLED TO, READ BACK BY AND VERIFIED WITH: PHARMD J STEENWYK [id number] AT 826 BY CM    Culture (A)  Final    ENTEROCOCCUS FAECALIS SUSCEPTIBILITIES PERFORMED ON PREVIOUS CULTURE WITHIN THE LAST 5 DAYS. Performed at [facility], [place] [address] [place], [place] [address]    Report Status 06/09/2019 FINAL  Final  Blood Culture ID Panel (Reflexed)     Status: Abnormal   Collection Time: 06/07/19 10:28 AM  Result Value Ref Range Status   Enterococcus species DETECTED (A) NOT DETECTED Final    Comment: CRITICAL RESULT CALLED TO, READ BACK BY AND VERIFIED WITH: PHARMD J STEENWKY [id number] AT 825 AM BY CM    Vancomycin resistance NOT DETECTED NOT DETECTED Final   Listeria monocytogenes NOT DETECTED NOT DETECTED Final   Staphylococcus species NOT DETECTED NOT DETECTED Final   Staphylococcus aureus (BCID) NOT DETECTED NOT DETECTED Final   Streptococcus species NOT DETECTED NOT DETECTED Final   Streptococcus agalactiae NOT DETECTED NOT DETECTED Final   Streptococcus pneumoniae NOT DETECTED NOT DETECTED Final   Streptococcus pyogenes NOT DETECTED NOT DETECTED Final   Acinetobacter baumannii NOT DETECTED NOT DETECTED Final   Enterobacteriaceae species NOT  DETECTED NOT DETECTED Final   Enterobacter cloacae complex NOT DETECTED NOT DETECTED Final   Escherichia coli NOT DETECTED NOT DETECTED Final   Klebsiella oxytoca NOT DETECTED NOT DETECTED Final   Klebsiella pneumoniae NOT DETECTED NOT DETECTED Final   Proteus species NOT DETECTED NOT DETECTED Final   Serratia marcescens NOT DETECTED NOT DETECTED Final   Haemophilus influenzae NOT DETECTED NOT DETECTED Final   Neisseria meningitidis NOT DETECTED NOT DETECTED Final   Pseudomonas aeruginosa NOT DETECTED NOT DETECTED Final   Candida albicans NOT DETECTED NOT DETECTED Final   Candida glabrata NOT DETECTED NOT DETECTED Final   Candida krusei NOT DETECTED NOT DETECTED Final   Candida parapsilosis NOT DETECTED NOT DETECTED Final   Candida tropicalis NOT DETECTED NOT DETECTED Final    Comment: Performed at [facility], [address], [place], [place] [address]  MRSA PCR Screening     Status: Abnormal   Collection Time: 06/08/19 12:11 AM   Specimen: Nasal Mucosa; Nasopharyngeal  Result Value Ref Range Status   MRSA by PCR POSITIVE (A) NEGATIVE Final    Comment:        The GeneXpert MRSA Assay (FDA  approved for NASAL specimens only), is one component of a comprehensive MRSA colonization surveillance program. It is not intended to diagnose MRSA infection nor to guide or monitor treatment for MRSA infections. RESULT CALLED TO, READ BACK BY AND VERIFIED WITH: MAHMOUD,Z RN 06/08/2019 AT 7408 SKEEN,P Performed at [facility], [place] [address], [place], [place] [address]   Culture, blood (routine x 2)     Status: None (Preliminary result)   Collection Time: 06/08/19  8:07 PM   Specimen: BLOOD  Result Value Ref Range Status   Specimen Description BLOOD RIGHT ANTECUBITAL  Final   Special Requests   Final    BOTTLES DRAWN AEROBIC AND ANAEROBIC Blood Culture adequate volume   Culture   Final    NO GROWTH 2 DAYS Performed at [facility], [place] [address] [place], [place]  [address]    Report Status PENDING  Incomplete  Culture, blood (routine x 2)     Status: None (Preliminary result)   Collection Time: 06/08/19  8:15 PM   Specimen: BLOOD LEFT HAND  Result Value Ref Range Status   Specimen Description BLOOD LEFT HAND  Final   Special Requests   Final    BOTTLES DRAWN AEROBIC ONLY Blood Culture adequate volume   Culture   Final    NO GROWTH 2 DAYS Performed at [facility], [place] [address], [place], [place] [address]    Report Status PENDING  Incomplete         Radiology Studies: ECHOCARDIOGRAM COMPLETE  Result Date: 06/09/2019    ECHOCARDIOGRAM REPORT   Patient Name:   MAIRANY BRUNO Date of Exam: 06/09/2019 Medical Rec #:  [id number]       Height:       63.0 in Accession #:    [id number]      Weight:       178.0 lb Date of Birth:  1929/10/31       BSA:          1.840 m Patient Age:    18 years        BP:           111/61 mmHg Patient Gender: F               HR:           87 bpm. Exam Location:  Inpatient Procedure: 2D Echo, Cardiac Doppler and Color Doppler Indications:    Bacteremia 790.7 / R78.81  History:        Patient has prior history of Echocardiogram examinations, most                 recent 04/27/2017. CHF, Stroke and COPD, Arrythmias:Atrial                 Fibrillation, Signs/Symptoms:Dyspnea and Chest Pain; Risk                 Factors:Hypertension, Diabetes, Dyslipidemia and Sleep Apnea.                 CKD.  Sonographer:    Tiffany Dance Referring Phys: [id number] [place]  1. Intracavitary gradient. Peak velocity 4.97 m/s. Peak gradient 99 mmHg. Left ventricular ejection fraction, by estimation, is 65 to 70%. Left ventricular ejection fraction by PLAX is 73 %. The left ventricle has hyperdynamic function. The left ventricle has no regional wall motion abnormalities. There is mild concentric left ventricular hypertrophy. Left ventricular diastolic parameters are consistent with Grade  I diastolic dysfunction (impaired relaxation).   2. Right ventricular systolic function is normal. The right ventricular size is normal. There is normal pulmonary artery systolic pressure.  3. Left atrial size was severely dilated.  4. Severe mitral annular calcification. There is a small mobile density on the left atrial side. This density was present on the echo dates 04/27/17.. The mitral valve is degenerative. Mild mitral valve regurgitation. Mild mitral stenosis.  5. The aortic valve is normal in structure. Aortic valve regurgitation is mild. Moderate aortic valve stenosis. Aortic valve mean gradient measures 19.0 mmHg. Aortic valve Vmax measures 2.96 m/s.  6. The inferior vena cava is normal in size with greater than 50% respiratory variability, suggesting right atrial pressure of 3 mmHg. FINDINGS  Left Ventricle: Intracavitary gradient. Peak velocity 4.97 m/s. Peak gradient 99 mmHg. Left ventricular ejection fraction, by estimation, is 65 to 70%. Left ventricular ejection fraction by PLAX is 73 % The left ventricle has hyperdynamic function. The left ventricle has no regional wall motion abnormalities. The left ventricular internal cavity size was normal in size. There is mild concentric left ventricular hypertrophy. Left ventricular diastolic parameters are consistent with Grade I diastolic dysfunction (impaired relaxation). Right Ventricle: The right ventricular size is normal. No increase in right ventricular wall thickness. Right ventricular systolic function is normal. There is normal pulmonary artery systolic pressure. The tricuspid regurgitant velocity is 2.17 m/s, and  with an assumed right atrial pressure of 3 mmHg, the estimated right ventricular systolic pressure is 16.3 mmHg. Left Atrium: Left atrial size was severely dilated. Right Atrium: Right atrial size was normal in size. Pericardium: There is no evidence of pericardial effusion. Mitral Valve: Severe mitral annular calcification. There is a small mobile density on the left atrial side. This  density was present on the echo dates 04/27/17. The mitral valve is degenerative in appearance. Normal mobility of the mitral valve leaflets. Severe mitral annular calcification. Mild mitral valve regurgitation. Mild mitral valve stenosis. MV peak gradient, 20.2 mmHg. The mean mitral valve gradient is 5.0 mmHg. Tricuspid Valve: The tricuspid valve is normal in structure. Tricuspid valve regurgitation is mild . No evidence of tricuspid stenosis. Aortic Valve: The aortic valve is normal in structure.. There is moderate thickening and moderate calcification of the aortic valve. Aortic valve regurgitation is mild. Moderate aortic stenosis is present. There is moderate thickening of the aortic valve. There is moderate calcification of the aortic valve. Aortic valve mean gradient measures 19.0 mmHg. Aortic valve peak gradient measures 35.2 mmHg. Aortic valve area, by VTI measures 1.41 cm. Pulmonic Valve: The pulmonic valve was normal in structure. Pulmonic valve regurgitation is not visualized. No evidence of pulmonic stenosis. Aorta: The aortic root is normal in size and structure. Venous: The inferior vena cava is normal in size with greater than 50% respiratory variability, suggesting right atrial pressure of 3 mmHg. IAS/Shunts: No atrial level shunt detected by color flow Doppler.  LEFT VENTRICLE PLAX 2D LV EF:         Left ventricular ejection fraction by PLAX is 73 % LVIDd:         2.50 cm LVIDs:         1.50 cm LV PW:         1.29 cm LV IVS:        1.20 cm LVOT diam:     1.90 cm LV SV:         70 LV SV Index:   38 LVOT Area:  2.84 cm  RIGHT VENTRICLE             IVC RV Basal diam:  1.80 cm     IVC diam: 1.60 cm RV S prime:     20.20 cm/s TAPSE (M-mode): 2.0 cm LEFT ATRIUM             Index       RIGHT ATRIUM          Index LA diam:        4.20 cm 2.28 cm/m  RA Area:     7.29 cm LA Vol (A2C):   62.0 ml 33.69 ml/m RA Volume:   10.70 ml 5.81 ml/m LA Vol (A4C):   85.3 ml 46.35 ml/m LA Biplane Vol: 75.3 ml  40.92 ml/m  AORTIC VALVE AV Area (Vmax):    1.45 cm AV Area (Vmean):   1.50 cm AV Area (VTI):     1.41 cm AV Vmax:           296.50 cm/s AV Vmean:          201.500 cm/s AV VTI:            0.497 m AV Peak Grad:      35.2 mmHg AV Mean Grad:      19.0 mmHg LVOT Vmax:         152.00 cm/s LVOT Vmean:        106.500 cm/s LVOT VTI:          0.246 m LVOT/AV VTI ratio: 0.50  AORTA Ao Root diam: 3.20 cm Ao Asc diam:  3.20 cm MITRAL VALVE                TRICUSPID VALVE MV Area (PHT): 1.38 cm     TR Peak grad:   18.8 mmHg MV Peak grad:  20.2 mmHg    TR Vmax:        217.00 cm/s MV Mean grad:  5.0 mmHg MV Vmax:       2.25 m/s     SHUNTS MV Vmean:      103.0 cm/s   Systemic VTI:  0.25 m MV Decel Time: 549 msec     Systemic Diam: 1.90 cm MV E velocity: 90.00 cm/s MV A velocity: 207.00 cm/s MV E/A ratio:  0.43 Skeet Latch MD Electronically signed by Skeet Latch MD Signature Date/Time: 06/09/2019/3:27:58 PM    Final         Scheduled Meds: . allopurinol  200 mg Oral Daily  . apixaban  5 mg Oral BID  . atorvastatin  10 mg Oral QHS  . Chlorhexidine Gluconate Cloth  6 each Topical Q0600  . famotidine  20 mg Oral BID  . insulin aspart  0-9 Units Subcutaneous TID WC  . isosorbide mononitrate  60 mg Oral Daily  . mupirocin ointment  1 application Nasal BID  . topiramate  25 mg Oral QHS  . vitamin B-12  1,000 mcg Oral Daily   Continuous Infusions: . ampicillin (OMNIPEN) IV 2 g (06/11/19 0444)  . cefTRIAXone (ROCEPHIN)  IV 2 g (06/10/19 2221)          Aline August, MD [facility] 06/11/2019, 7:37 AM

## 2019-06-12 LAB — BASIC METABOLIC PANEL
Anion gap: 14 (ref 5–15)
BUN: 6 mg/dL — ABNORMAL LOW (ref 8–23)
CO2: 18 mmol/L — ABNORMAL LOW (ref 22–32)
Calcium: 8.9 mg/dL (ref 8.9–10.3)
Chloride: 110 mmol/L (ref 98–111)
Creatinine, Ser: 0.8 mg/dL (ref 0.44–1.00)
GFR calc Af Amer: >60 mL/min (ref >60)
GFR calc non Af Amer: >60 mL/min (ref >60)
Glucose, Bld: 140 mg/dL — ABNORMAL HIGH (ref 70–99)
Potassium: 3.6 mmol/L (ref 3.5–5.1)
Sodium: 142 mmol/L (ref 135–145)

## 2019-06-12 LAB — CBC WITH DIFFERENTIAL/PLATELET
Abs Immature Granulocytes: 0.1 10*3/uL — ABNORMAL HIGH (ref 0.00–0.07)
Basophils Absolute: 0.1 10*3/uL (ref 0.0–0.1)
Basophils Relative: 1 %
Eosinophils Absolute: 0.3 10*3/uL (ref 0.0–0.5)
Eosinophils Relative: 3 %
HCT: 45.5 % (ref 36.0–46.0)
Hemoglobin: 14.7 g/dL (ref 12.0–15.0)
Immature Granulocytes: 1 %
Lymphocytes Relative: 20 %
Lymphs Abs: 2 10*3/uL (ref 0.7–4.0)
MCH: 29 pg (ref 26.0–34.0)
MCHC: 32.3 g/dL (ref 30.0–36.0)
MCV: 89.7 fL (ref 80.0–100.0)
Monocytes Absolute: 0.9 10*3/uL (ref 0.1–1.0)
Monocytes Relative: 9 %
Neutro Abs: 6.8 10*3/uL (ref 1.7–7.7)
Neutrophils Relative %: 66 %
Platelets: 250 10*3/uL (ref 150–400)
RBC: 5.07 MIL/uL (ref 3.87–5.11)
RDW: 19.7 % — ABNORMAL HIGH (ref 11.5–15.5)
WBC: 10.2 10*3/uL (ref 4.0–10.5)
nRBC: 0 % (ref 0.0–0.2)

## 2019-06-12 LAB — CULTURE, BLOOD (ROUTINE X 2): Culture: NO GROWTH

## 2019-06-12 LAB — GLUCOSE, CAPILLARY
Glucose-Capillary: 136 mg/dL — ABNORMAL HIGH (ref 70–99)
Glucose-Capillary: 146 mg/dL — ABNORMAL HIGH (ref 70–99)
Glucose-Capillary: 139 mg/dL — ABNORMAL HIGH (ref 70–99)
Glucose-Capillary: 131 mg/dL — ABNORMAL HIGH (ref 70–99)

## 2019-06-12 LAB — MAGNESIUM: Magnesium: 1.8 mg/dL (ref 1.7–2.4)

## 2019-06-12 NOTE — Progress Notes (Signed)
Pt did not eat or drink anything last night. Pt voided three different times soaking the bed pad each time. Purewick will not stay in place and is not catching the urine to measure.   Eleanora Neighbor, RN

## 2019-06-12 NOTE — Progress Notes (Signed)
Pt refused all po meds. Pt will not eat or drink anything.   Eleanora Neighbor, RN

## 2019-06-12 NOTE — Progress Notes (Signed)
Patient is not eating or drinking. No oral medication given. MD is aware. Will continue to monitor patient.

## 2019-06-12 NOTE — Progress Notes (Signed)
Patient ID: Kara Ortega, female   DOB: 1929/09/15, 86 y.o.   MRN: [id number]  PROGRESS NOTE    Kara Ortega  GYF:[id number] DOB: Jul 09, 1929 DOA: 06/06/2019 PCP: Carol Ada, MD   Brief Narrative:  86 year old female with history of permanent A. fib, chronic kidney disease stage II, advanced dementia, diabetes mellitus type 2 on insulin, hypertension, nonhemorrhagic stroke with left-sided hemiparesis, Covid infection in January 2021 presented from memory care nursing facility with hallucinations, generalized weakness, decreased responsiveness and dark/cloudy urine and admitted for dehydration, AKI, toxic encephalopathy due to UTI and started on IV antibiotics and fluids.  She was found to have Enterococcus faecalis bacteremia for which ID was consulted.  2D echo showed questionable mobile density on the left atrial side for which cardiology was also consulted.  Palliative care was also consulted.  Assessment & Plan:   Enterococcus faecalis bacteremia: Present on admission UTI -Continue ampicillin. Repeat blood cultures negative so far.   -2D echo showed EF of 65 to 70% with grade 1 diastolic dysfunction and a small mobile density on the left atrial side, which was present on the echo date 04/27/2017 apparently.  Cardiology has evaluated the patient and think that this is not a vegetation and probably is severe mitral annular calcification and recommend no further cardiac work-up.  Cardiology recommends to continue to encourage palliative care.  Cardiology has signed off -ID has recommended 6 weeks of IV Rocephin and ampicillin till 07/20/2019.   ID has signed off.  Outpatient follow-up with ID. -PICC line placement was held on 06/11/2019 as family was still deciding if they would want to pursue long-term antibiotic treatment versus residential hospice as patient is not eating or drinking well.   Acute metabolic encephalopathy: Most likely secondary to dehydration and UTI History of dementia    -Monitor mental status.  Mental status probably back to her baseline. -Fall precautions  Dehydration Acute kidney injury -From above and poor oral intake.  Treated with IV fluids.  Off IV fluids. -Encourage oral intake. -Acute kidney injury has resolved  Hypomagnesemia -Improved  Permanent A. Fib -Mali VASC 2 Score of > 4  -Rate controlled.  Continue Eliquis.  History of nonhemorrhagic stroke with mild left-sided weakness -Stable.  Continue Eliquis along with statin  Hypertension  -Blood pressure intermittently elevated.  Continue Imdur.  Might have to resume diuretics on discharge.  Dyslipidemia -Continue statin  History of gout  -continue allopurinol  Chronic diastolic CHF with EF of 91% -Currently compensated.  Might have to resume diuretics on discharge.  Strict input and output.  Daily weights.  Diabetes mellitus type 2 -A1c 8.1.  Continue CBGs with SSI.  Carb modified diet  Generalized deconditioning -PT recommends SNF placement.  Social worker following.  Overall prognosis is guarded to poor.  Palliative care following.  CODE STATUS has been changed to DNR.   -Patient is not eating or drinking and is progressively going downhill.  Overall prognosis is very poor.   Family is still deciding if they would want to pursue long-term antibiotic treatment versus residential hospice; they want to wait till Monday to see if she would eat or drink better; if not, they would opt for residential hospice.   -At this point, I recommend full comfort's measures/hospice   DVT prophylaxis: Eliquis Code Status: DNR Family Communication: Spoke to son/Richard [place] on phone on 06/10/2019 Disposition Plan: SNF versus residential hospice pending family decision consultants: ID/palliative care/cardiology  Procedures:  Echo IMPRESSIONS    1. Intracavitary gradient. Peak velocity  4.97 m/s. Peak gradient 99 mmHg.  Left ventricular ejection fraction, by estimation, is 65 to 70%. Left   ventricular ejection fraction by PLAX is 73 %. The left ventricle has  hyperdynamic function. The left  ventricle has no regional wall motion abnormalities. There is mild  concentric left ventricular hypertrophy. Left ventricular diastolic  parameters are consistent with Grade I diastolic dysfunction (impaired  relaxation).  2. Right ventricular systolic function is normal. The right ventricular  size is normal. There is normal pulmonary artery systolic pressure.  3. Left atrial size was severely dilated.  4. Severe mitral annular calcification. There is a small mobile density  on the left atrial side. This density was present on the echo dates  04/27/17.. The mitral valve is degenerative. Mild mitral valve  regurgitation. Mild mitral stenosis.  5. The aortic valve is normal in structure. Aortic valve regurgitation is  mild. Moderate aortic valve stenosis. Aortic valve mean gradient measures  19.0 mmHg. Aortic valve Vmax measures 2.96 m/s.  6. The inferior vena cava is normal in size with greater than 50%  respiratory variability, suggesting right atrial pressure of 3 mmHg.   Antimicrobials:  Anti-infectives (From admission, onward)   Start     Dose/Rate Route Frequency Ordered Stop   06/10/19 1330  cefTRIAXone (ROCEPHIN) 2 g in sodium chloride 0.9 % 100 mL IVPB     2 g 200 mL/hr over 30 Minutes Intravenous Every 12 hours 06/10/19 1247     06/08/19 1600  ampicillin (OMNIPEN) 2 g in sodium chloride 0.9 % 100 mL IVPB     2 g 300 mL/hr over 20 Minutes Intravenous Every 6 hours 06/08/19 1144     06/08/19 0845  ampicillin (OMNIPEN) 2 g in sodium chloride 0.9 % 100 mL IVPB     2 g 300 mL/hr over 20 Minutes Intravenous  Once 06/08/19 0835 06/08/19 1032   06/07/19 1630  cefTRIAXone (ROCEPHIN) 2 g in sodium chloride 0.9 % 100 mL IVPB  Status:  Discontinued     2 g 200 mL/hr over 30 Minutes Intravenous Every 24 hours 06/07/19 0953 06/08/19 0834   06/06/19 1630  cefTRIAXone (ROCEPHIN) 1  g in sodium chloride 0.9 % 100 mL IVPB  Status:  Discontinued     1 g 200 mL/hr over 30 Minutes Intravenous  Once 06/06/19 1619 06/06/19 1626   06/06/19 1630  cefTRIAXone (ROCEPHIN) 1 g in sodium chloride 0.9 % 100 mL IVPB  Status:  Discontinued     1 g 200 mL/hr over 30 Minutes Intravenous Every 24 hours 06/06/19 1620 06/07/19 0953       Subjective: Patient seen and examined at bedside.  Poor historian.  Nursing staff reports very poor oral/liquid intake.  No overnight fever or vomiting reported. Objective: Vitals:   06/11/19 0900 06/11/19 1649 06/11/19 2127 06/12/19 0441  BP: (!) 142/90 (!) 112/51 127/66 (!) 106/52  Pulse: (!) 102 82 69 (!) 58  Resp:  18 20 16   Temp:   (!) 97.5 F (36.4 C) 98 F (36.7 C)  TempSrc:   Axillary Axillary  SpO2: 98% 94% 95% 95%  Weight:   80.3 kg   Height:        Intake/Output Summary (Last 24 hours) at 06/12/2019 0738 Last data filed at 06/12/2019 2993 Gross per 24 hour  Intake 603.33 ml  Output 0 ml  Net 603.33 ml   Filed Weights   06/08/19 0917 06/09/19 2053 06/11/19 2127  Weight: 80.7 kg 80.7 kg 80.3 kg  Examination:  General exam: No acute distress.  Elderly female.  Awake, hardly answers any questions.  Poor historian.   Respiratory system: Bilateral decreased breath sounds at bases with scattered crackles.  No wheezing  cardiovascular system: Intermittently tachycardic, S1-S2 heard Gastrointestinal system: Abdomen is nondistended, soft and nontender.  Bowel sounds are heard  extremities: Bilateral lower extremity edema present; no cyanosis or clubbing   Data Reviewed: I have personally reviewed following labs and imaging studies  CBC: Recent Labs  Lab 06/08/19 0448 06/09/19 0427 06/10/19 0833 06/11/19 0526 06/12/19 0427  WBC 8.3 9.3 8.0 8.9 10.2  NEUTROABS 5.5 6.2 5.4 6.1 6.8  HGB 14.1 15.2* 14.3 14.0 14.7  HCT 43.9 46.8* 43.5 42.7 45.5  MCV 90.3 91.1 88.1 89.3 89.7  PLT 196 245 234 240 419   Basic Metabolic  Panel: Recent Labs  Lab 06/08/19 0448 06/09/19 0427 06/10/19 0833 06/11/19 0526 06/12/19 0427  NA 140 140 141 142 142  K 3.9 4.8 3.3* 4.2 3.6  CL 110 108 107 112* 110  CO2 18* 18* 20* 17* 18*  GLUCOSE 108* 133* 157* 167* 140*  BUN 9 8 7* 6* 6*  CREATININE 0.78 0.81 0.78 0.88 0.80  CALCIUM 8.9 9.4 9.4 9.0 8.9  MG 1.4* 1.5* 1.7 1.4* 1.8   GFR: Estimated Creatinine Clearance: 49.7 mL/min (by C-G formula based on SCr of 0.8 mg/dL). Liver Function Tests: Recent Labs  Lab 06/06/19 1410 06/08/19 0448 06/09/19 0427 06/10/19 0833 06/11/19 0526  AST 26 28 51* 20 21  ALT 21 23 16 20 17   ALKPHOS 63 59 59 63 56  BILITOT 0.9 0.7 2.1* 0.6 0.8  PROT 6.0* 5.6* 5.8* 6.1* 5.6*  ALBUMIN 3.2* 2.7* 2.9* 3.0* 2.9*   No results for input(s): LIPASE, AMYLASE in the last 168 hours. No results for input(s): AMMONIA in the last 168 hours. Coagulation Profile: No results for input(s): INR, PROTIME in the last 168 hours. Cardiac Enzymes: No results for input(s): CKTOTAL, CKMB, CKMBINDEX, TROPONINI in the last 168 hours. BNP (last 3 results) No results for input(s): PROBNP in the last 8760 hours. HbA1C: No results for input(s): HGBA1C in the last 72 hours. CBG: Recent Labs  Lab 06/11/19 0728 06/11/19 1145 06/11/19 1651 06/11/19 2127 06/12/19 0648  GLUCAP 164* 144* 151* 113* 136*   Lipid Profile: No results for input(s): CHOL, HDL, LDLCALC, TRIG, CHOLHDL, LDLDIRECT in the last 72 hours. Thyroid Function Tests: No results for input(s): TSH, T4TOTAL, FREET4, T3FREE, THYROIDAB in the last 72 hours. Anemia Panel: No results for input(s): VITAMINB12, FOLATE, FERRITIN, TIBC, IRON, RETICCTPCT in the last 72 hours. Sepsis Labs: Recent Labs  Lab 06/06/19 1410 06/06/19 1645 06/07/19 1027 06/08/19 0448 06/09/19 0427  PROCALCITON  --   --  <0.10 <0.10 <0.10  LATICACIDVEN 3.4* 2.8*  --   --   --     Recent Results (from the past 240 hour(s))  Urine Culture     Status: Abnormal    Collection Time: 06/06/19  3:03 PM   Specimen: Urine, Catheterized  Result Value Ref Range Status   Specimen Description URINE, CATHETERIZED  Final   Special Requests   Final    ADDED 2253 Performed at [facility], [place] [address] [place], [place] [address]    Culture 70,000 COLONIES/mL PROTEUS MIRABILIS (A)  Final   Report Status 06/09/2019 FINAL  Final   Organism ID, Bacteria PROTEUS MIRABILIS (A)  Final      Susceptibility   Proteus mirabilis - MIC*  AMPICILLIN <=2 SENSITIVE Sensitive     CEFAZOLIN 8 SENSITIVE Sensitive     CEFTRIAXONE <=0.25 SENSITIVE Sensitive     CIPROFLOXACIN >=4 RESISTANT Resistant     GENTAMICIN <=1 SENSITIVE Sensitive     IMIPENEM 2 SENSITIVE Sensitive     NITROFURANTOIN 128 RESISTANT Resistant     TRIMETH/SULFA >=320 RESISTANT Resistant     AMPICILLIN/SULBACTAM <=2 SENSITIVE Sensitive     PIP/TAZO <=4 SENSITIVE Sensitive     * 70,000 COLONIES/mL PROTEUS MIRABILIS  Blood Culture (routine x 2)     Status: Abnormal   Collection Time: 06/06/19  3:20 PM   Specimen: BLOOD  Result Value Ref Range Status   Specimen Description BLOOD RIGHT ANTECUBITAL  Final   Special Requests   Final    BOTTLES DRAWN AEROBIC AND ANAEROBIC Blood Culture results may not be optimal due to an inadequate volume of blood received in culture bottles   Culture  Setup Time   Final    GRAM POSITIVE COCCI IN CHAINS ANAEROBIC BOTTLE ONLY CRITICAL RESULT CALLED TO, READ BACK BY AND VERIFIED WITH: Lorita Officer  Foreman [id number] [place] Performed at [facility], [place] [address] [place], [place] [address]    Culture ENTEROCOCCUS FAECALIS (A)  Final   Report Status 06/09/2019 FINAL  Final   Organism ID, Bacteria ENTEROCOCCUS FAECALIS  Final      Susceptibility   Enterococcus faecalis - MIC*    AMPICILLIN <=2 SENSITIVE Sensitive     VANCOMYCIN 1 SENSITIVE Sensitive     GENTAMICIN SYNERGY SENSITIVE Sensitive     * ENTEROCOCCUS FAECALIS  Blood Culture (routine x 2)      Status: None   Collection Time: 06/06/19  3:30 PM   Specimen: BLOOD  Result Value Ref Range Status   Specimen Description BLOOD LEFT ANTECUBITAL  Final   Special Requests   Final    BOTTLES DRAWN AEROBIC AND ANAEROBIC Blood Culture results may not be optimal due to an inadequate volume of blood received in culture bottles   Culture   Final    NO GROWTH 5 DAYS Performed at [facility], [place] [address], [place], [place] [address]    Report Status 06/11/2019 FINAL  Final  SARS CORONAVIRUS 2 (TAT 6-24 HRS) Nasopharyngeal Nasopharyngeal Swab     Status: None   Collection Time: 06/06/19  5:55 PM   Specimen: Nasopharyngeal Swab  Result Value Ref Range Status   SARS Coronavirus 2 NEGATIVE NEGATIVE Final    Comment: (NOTE) SARS-CoV-2 target nucleic acids are NOT DETECTED. The SARS-CoV-2 RNA is generally detectable in upper and lower respiratory specimens during the acute phase of infection. Negative results do not preclude SARS-CoV-2 infection, do not rule out co-infections with other pathogens, and should not be used as the sole basis for treatment or other patient management decisions. Negative results must be combined with clinical observations, patient history, and epidemiological information. The expected result is Negative. Fact Sheet for Patients: SugarRoll.be Fact Sheet for Healthcare Providers: https://www.woods-mathews.com/ This test is not yet approved or cleared by the [place] FDA and  has been authorized for detection and/or diagnosis of SARS-CoV-2 by FDA under an Emergency Use Authorization (EUA). This EUA will remain  in effect (meaning this test can be used) for the duration of the COVID-19 declaration under Section 56 4(b)(1) of the Act, 21 U.S.C. section 360bbb-3(b)(1), unless the authorization is terminated or revoked sooner. Performed at [facility], [place] [address], [place], [place] [address]  Culture,  blood (routine x 2)     Status: None (Preliminary result)   Collection Time: 06/07/19 10:27 AM   Specimen: BLOOD  Result Value Ref Range Status   Specimen Description BLOOD RIGHT ANTECUBITAL  Final   Special Requests   Final    BOTTLES DRAWN AEROBIC AND ANAEROBIC Blood Culture results may not be optimal due to an inadequate volume of blood received in culture bottles   Culture   Final    NO GROWTH 4 DAYS Performed at [facility], [place] [address], [place], [place] [address]    Report Status PENDING  Incomplete  Culture, blood (routine x 2)     Status: Abnormal   Collection Time: 06/07/19 10:28 AM   Specimen: BLOOD RIGHT ARM  Result Value Ref Range Status   Specimen Description BLOOD RIGHT ARM  Final   Special Requests   Final    BOTTLES DRAWN AEROBIC ONLY Blood Culture results may not be optimal due to an inadequate volume of blood received in culture bottles   Culture  Setup Time   Final    GRAM POSITIVE COCCI IN CHAINS AEROBIC BOTTLE ONLY CRITICAL RESULT CALLED TO, READ BACK BY AND VERIFIED WITH: PHARMD J STEENWYK [id number] AT 826 BY CM    Culture (A)  Final    ENTEROCOCCUS FAECALIS SUSCEPTIBILITIES PERFORMED ON PREVIOUS CULTURE WITHIN THE LAST 5 DAYS. Performed at [facility], [place] [address], [place], [place] [address]    Report Status 06/09/2019 FINAL  Final  Blood Culture ID Panel (Reflexed)     Status: Abnormal   Collection Time: 06/07/19 10:28 AM  Result Value Ref Range Status   Enterococcus species DETECTED (A) NOT DETECTED Final    Comment: CRITICAL RESULT CALLED TO, READ BACK BY AND VERIFIED WITH: PHARMD J STEENWKY [id number] AT 825 AM BY CM    Vancomycin resistance NOT DETECTED NOT DETECTED Final   Listeria monocytogenes NOT DETECTED NOT DETECTED Final   Staphylococcus species NOT DETECTED NOT DETECTED Final   Staphylococcus aureus (BCID) NOT DETECTED NOT DETECTED Final   Streptococcus species NOT DETECTED NOT DETECTED Final   Streptococcus agalactiae NOT  DETECTED NOT DETECTED Final   Streptococcus pneumoniae NOT DETECTED NOT DETECTED Final   Streptococcus pyogenes NOT DETECTED NOT DETECTED Final   Acinetobacter baumannii NOT DETECTED NOT DETECTED Final   Enterobacteriaceae species NOT DETECTED NOT DETECTED Final   Enterobacter cloacae complex NOT DETECTED NOT DETECTED Final   Escherichia coli NOT DETECTED NOT DETECTED Final   Klebsiella oxytoca NOT DETECTED NOT DETECTED Final   Klebsiella pneumoniae NOT DETECTED NOT DETECTED Final   Proteus species NOT DETECTED NOT DETECTED Final   Serratia marcescens NOT DETECTED NOT DETECTED Final   Haemophilus influenzae NOT DETECTED NOT DETECTED Final   Neisseria meningitidis NOT DETECTED NOT DETECTED Final   Pseudomonas aeruginosa NOT DETECTED NOT DETECTED Final   Candida albicans NOT DETECTED NOT DETECTED Final   Candida glabrata NOT DETECTED NOT DETECTED Final   Candida krusei NOT DETECTED NOT DETECTED Final   Candida parapsilosis NOT DETECTED NOT DETECTED Final   Candida tropicalis NOT DETECTED NOT DETECTED Final    Comment: Performed at [facility], [address], [place], [place] [address]  MRSA PCR Screening     Status: Abnormal   Collection Time: 06/08/19 12:11 AM   Specimen: Nasal Mucosa; Nasopharyngeal  Result Value Ref Range Status   MRSA by PCR POSITIVE (A) NEGATIVE Final    Comment:  The GeneXpert MRSA Assay (FDA approved for NASAL specimens only), is one component of a comprehensive MRSA colonization surveillance program. It is not intended to diagnose MRSA infection nor to guide or monitor treatment for MRSA infections. RESULT CALLED TO, READ BACK BY AND VERIFIED WITH: MAHMOUD,Z RN 06/08/2019 AT 8502 SKEEN,P Performed at [facility], [place] [address] [place], [place] [address]   Culture, blood (routine x 2)     Status: None (Preliminary result)   Collection Time: 06/08/19  8:07 PM   Specimen: BLOOD  Result Value Ref Range Status   Specimen Description  BLOOD RIGHT ANTECUBITAL  Final   Special Requests   Final    BOTTLES DRAWN AEROBIC AND ANAEROBIC Blood Culture adequate volume   Culture   Final    NO GROWTH 3 DAYS Performed at [facility], [place] [address] [place], [place] [address]    Report Status PENDING  Incomplete  Culture, blood (routine x 2)     Status: None (Preliminary result)   Collection Time: 06/08/19  8:15 PM   Specimen: BLOOD LEFT HAND  Result Value Ref Range Status   Specimen Description BLOOD LEFT HAND  Final   Special Requests   Final    BOTTLES DRAWN AEROBIC ONLY Blood Culture adequate volume   Culture   Final    NO GROWTH 3 DAYS Performed at [facility], [place] [address], [place], [place] [address]    Report Status PENDING  Incomplete         Radiology Studies: [place] EKG SITE RITE  Result Date: 06/11/2019 If Site Rite image not attached, placement could not be confirmed due to current cardiac rhythm.       Scheduled Meds: . allopurinol  200 mg Oral Daily  . apixaban  5 mg Oral BID  . atorvastatin  10 mg Oral QHS  . famotidine  20 mg Oral BID  . insulin aspart  0-9 Units Subcutaneous TID WC  . isosorbide mononitrate  60 mg Oral Daily  . mupirocin ointment  1 application Nasal BID  . topiramate  25 mg Oral QHS  . vitamin B-12  1,000 mcg Oral Daily   Continuous Infusions: . ampicillin (OMNIPEN) IV 2 g (06/12/19 0359)  . cefTRIAXone (ROCEPHIN)  IV 2 g (06/11/19 2257)          Aline August, MD [facility] 06/12/2019, 7:38 AM

## 2019-06-13 LAB — CULTURE, BLOOD (ROUTINE X 2)
Culture: NO GROWTH
Culture: NO GROWTH
Special Requests: ADEQUATE
Special Requests: ADEQUATE

## 2019-06-13 LAB — GLUCOSE, CAPILLARY
Glucose-Capillary: 157 mg/dL — ABNORMAL HIGH (ref 70–99)
Glucose-Capillary: 79 mg/dL (ref 70–99)
Glucose-Capillary: 159 mg/dL — ABNORMAL HIGH (ref 70–99)
Glucose-Capillary: 164 mg/dL — ABNORMAL HIGH (ref 70–99)

## 2019-06-13 NOTE — Progress Notes (Signed)
Palliative Medicine RN Note: Called front desk and nursing station to notify them of family meeting scheduled for 1600 today. Expecting children Shon Hale, Keenan Bachelor, and [place].  Marjie Skiff Kaliana Albino, RN, BSN, [facility] Palliative Medicine Team 06/13/2019 1:02 PM Office [phone]

## 2019-06-13 NOTE — Progress Notes (Signed)
Daily Progress Note   Patient Name: Kara Ortega       Date: 06/13/2019 DOB: [birth year]  Age: 86 y.o. MRN#: [id number] Attending Physician: Kara August, MD Primary Care Physician: Kara Ada, MD Admit Date: 06/06/2019  Reason for Consultation/Follow-up: Establishing goals of care and Psychosocial/spiritual support  Subjective: Spoke with RN in early morning.  Patient had 1st PO intake in 3 days.  She ate her pills in apple sauce and then drank 1/2 cup of water.  Patient had been non-verbal.  She began calling out.  Kara Ortega and Kara Ortega came for a Palliative meeting at 4:00 pm.  Alaisa smiled when she saw them.  I left them to visit.  Rick stepped out about 15 minutes later and said they were ready to meet with me.  They were happy that Kara Ortega had engaged some with them and even asked for prayer.  We spoke privately.  I asked their thoughts.  Kara Ortega expressed that she felt her mother would want to be let go.  That she would not want to live bed bound in a SNF dependent on others.  Kara Ortega was more reserved.  We talked about what it may look like if she received 6 weeks of antibiotics in a SNF.  The family would not have freedom to visit.  If she did not eat/hydrate enough she would quickly develop renal failure  - - that may happen even faster on IV antibiotics - as antibiotics need the kidneys to be metabolized.   Being bedbound comes with a who new realm of medical issues - UTIs, aspiration, bed sores.   The family understands these things.  We have previously talked about what hospice house would look like - the family could visit.  The 24 hour nursing care would be focused completely on Kara Ortega's comfort.  Kara Ortega indicates that they really need to have another conversation with his  brother Kara Ortega before a final decision can be made. He feels they will have a final decision tomorrow morning.  Assessment: Patient having a good day - more alert, taking bites and sips.  Verbalizing, yelling out.  I am concerned patient will not take enough PO to avoid renal failure and process her antibiotics.   I believe she would return to the hospital within a couple of weeks even weaker than she  is now.   Patient Profile/HPI:  86 y.o. female  with past medical history of recent [place] hospitalization dc'd 1/21 to the [facility], Alzheimer's dementia, CKD3, OSA on CPAP, admitted on 06/06/2019 with altered mental status and findings of dehydration, UTI, and bacteremia- etiology unclear, TEE pending for possible endocarditis.  Palliative medicine consulted for [place].    Length of Stay: 7  Current Medications: Scheduled Meds:  . allopurinol  200 mg Oral Daily  . apixaban  5 mg Oral BID  . atorvastatin  10 mg Oral QHS  . famotidine  20 mg Oral BID  . insulin aspart  0-9 Units Subcutaneous TID WC  . isosorbide mononitrate  60 mg Oral Daily  . topiramate  25 mg Oral QHS  . vitamin B-12  1,000 mcg Oral Daily    Continuous Infusions: . ampicillin (OMNIPEN) IV 2 g (06/13/19 1502)  . cefTRIAXone (ROCEPHIN)  IV Stopped (06/13/19 1704)    PRN Meds: acetaminophen, ALPRAZolam, nitroGLYCERIN, traMADol  Physical Exam        Well developed elderly demented female, lying in bed.  Will not keep her gown on. CV rrr resp no distress Abdomen obese, soft, nt, nd  Vital Signs: BP (!) 94/52 (BP Location: Right Arm)   Pulse 88   Temp 98 F (36.7 C) (Axillary)   Resp 16   Ht 5\' 3"  (1.6 m)   Wt 78.9 kg   SpO2 96%   BMI 30.82 kg/m  SpO2: SpO2: 96 % O2 Device: O2 Device: Room Air O2 Flow Rate:    Intake/output summary:   Intake/Output Summary (Last 24 hours) at 06/13/2019 1953 Last data filed at 06/13/2019 1700 Gross per 24 hour  Intake 1096 ml  Output 0 ml  Net 1096 ml   LBM: Last BM  Date: 06/10/19 Baseline Weight: Weight: 80.7 kg Most recent weight: Weight: 78.9 kg       Palliative Assessment/Data: 20%      Patient Active Problem List   Diagnosis Date Noted  . Pressure injury of skin 06/10/2019  . Altered mental status   . Bacteremia due to Enterococcus 06/08/2019  . Advanced care planning/counseling discussion   . Goals of care, counseling/discussion   . Palliative care by specialist   . Acute lower UTI 06/06/2019  . Lactic acidosis 06/06/2019  . UTI (urinary tract infection) 06/06/2019  . Dementia without behavioral disturbance ([place])   . OSA on CPAP   . Palliative care encounter   . Acute respiratory failure due to COVID-19 ([place]) 04/13/2019  . Essential tremor 09/14/2014  . High cholesterol 05/19/2014  . Persistent atrial fibrillation ([place]) 05/19/2014  . Long-term use of high-risk medication 05/19/2014  . Accident due to mechanical fall without injury 03/24/2014  . Diastolic dysfunction with chronic heart failure ([place]) 03/24/2014  . Sinus bradycardia 03/24/2014  . Hypotension 03/24/2014  . OSA (obstructive sleep apnea) 03/24/2014  . Warfarin anticoagulation 03/24/2014  . Aortic stenosis 03/24/2014  . Mitral stenosis 03/24/2014  . DOE (dyspnea on exertion) 03/24/2014  . Recurrent falls 03/24/2014  . AKI (acute kidney injury) ([place]) 03/23/2014  . Alzheimer's dementia ([place]) 12/26/2013  . Unstable angina ([place]) 09/14/2013  . Diarrhea 09/14/2013  . Chest pain at rest- most likely GI related 09/14/2013  . CHF (congestive heart failure) ([place])   . A-fib ([place])   . Diabetes ([place])   . Dyspnea on exertion 03/25/2013    Palliative Care Plan    Recommendations/Plan:  Continue current care. Encourage PO intake with aspiration precautions.  Daughter Kara Ortega will be designated visitor.  Family to give Palliative their decision about PICC/Antibiotics/SNF vs Hospice House Tuesday morning.   Family is very reasonable and well informed.  If they opt for  hospice house we will be able to shift to comfort measures and all 4 person visitation.   Goals of Care and Additional Recommendations:  Comfort is a priority.  Code Status:  DNR  Prognosis:   < 2 weeks as she is taking in only sips and bites.  +bacteremia. HF, OSA, CVA, CKD, bedbound.  Discharge Planning:  To Be Determined  Care plan was discussed with family, nursing, Dr. Starla Link  Thank you for allowing the Palliative Medicine Team to assist in the care of this patient.  Total time spent:  60 min.     Greater than 50%  of this time was spent counseling and coordinating care related to the above assessment and plan.  Florentina Jenny, PA-C Palliative Medicine  Please contact Palliative MedicineTeam phone at [phone] for questions and concerns between 7 am - 7 pm.   Please see AMION for individual provider pager numbers.

## 2019-06-13 NOTE — Plan of Care (Signed)

## 2019-06-13 NOTE — Progress Notes (Signed)
Pt is much more alert this am. Pt has been all over the bed and will not keep her gown on. RN tried to give pt something to drink again this am and she actually said "yes, yes, yes" when this RN asked if she wanted some water. Pt drank half of the cup and smiled when RN told her that she did a good job drinking and that her family would be proud of her.   Eleanora Neighbor, RN

## 2019-06-13 NOTE — Progress Notes (Signed)
Patient ID: Kara Ortega, female   DOB: 01-Oct-1929, 86 y.o.   MRN: [id number]  PROGRESS NOTE    Kara Ortega  TKP:[id number] DOB: Nov 11, 1929 DOA: 06/06/2019 PCP: Carol Ada, MD   Brief Narrative:  86 year old female with history of permanent A. fib, chronic kidney disease stage II, advanced dementia, diabetes mellitus type 2 on insulin, hypertension, nonhemorrhagic stroke with left-sided hemiparesis, Covid infection in January 2021 presented from memory care nursing facility with hallucinations, generalized weakness, decreased responsiveness and dark/cloudy urine and admitted for dehydration, AKI, toxic encephalopathy due to UTI and started on IV antibiotics and fluids.  She was found to have Enterococcus faecalis bacteremia for which ID was consulted.  2D echo showed questionable mobile density on the left atrial side for which cardiology was also consulted.  Palliative care was also consulted.  Assessment & Plan:   Enterococcus faecalis bacteremia: Present on admission UTI -Continue ampicillin. Repeat blood cultures negative so far.   -2D echo showed EF of 65 to 70% with grade 1 diastolic dysfunction and a small mobile density on the left atrial side, which was present on the echo date 04/27/2017 apparently.  Cardiology has evaluated the patient and think that this is not a vegetation and probably is severe mitral annular calcification and recommend no further cardiac work-up.  Cardiology recommends to continue to encourage palliative care.  Cardiology has signed off -ID has recommended 6 weeks of IV Rocephin and ampicillin till 07/20/2019.   ID has signed off.  Outpatient follow-up with ID. -PICC line placement was held on 06/11/2019 as family was still deciding if they would want to pursue long-term antibiotic treatment versus residential hospice as patient is not eating or drinking well.  Acute metabolic encephalopathy: Most likely secondary to dehydration and UTI History of dementia    -Monitor mental status.  Mental status probably back to her baseline. -Fall precautions  Dehydration Acute kidney injury -From above and poor oral intake.  Treated with IV fluids.  Off IV fluids. -Encourage oral intake. -Acute kidney injury has resolved  Hypomagnesemia -Improved  Permanent A. Fib -Mali VASC 2 Score of > 4  -Rate controlled.  Continue Eliquis.  History of nonhemorrhagic stroke with mild left-sided weakness -Stable.  Continue Eliquis along with statin if able to take orally.  Hypertension  -Blood pressure intermittently elevated.  Continue Imdur.  Diuretics on hold for now.  Dyslipidemia -Continue statin  History of gout  -continue allopurinol  Chronic diastolic CHF with EF of 51% -Currently compensated.  Strict input and output.  Daily weights.  Diabetes mellitus type 2 -A1c 8.1.  Continue CBGs with SSI.  Carb modified diet  Generalized deconditioning -PT recommends SNF placement.  Social worker following.  Overall prognosis is guarded to poor.  Palliative care following.  CODE STATUS has been changed to DNR.   -Patient is not eating or drinking and is progressively going downhill.  Overall prognosis is very poor.   Family is still deciding if they would want to pursue long-term antibiotic treatment versus residential hospice; they want to wait till today to see if she would eat or drink better; if not, they would opt for residential hospice.   -At this point, I recommend full comfort measures/hospice.  Will not be doing daily blood work for now.   DVT prophylaxis: Eliquis Code Status: DNR Family Communication: Spoke to son/Richard Frankenmuth on phone on 06/10/2019 Disposition Plan: SNF versus residential hospice pending family decision consultants: ID/palliative care/cardiology  Procedures:  Echo IMPRESSIONS    1.  Intracavitary gradient. Peak velocity 4.97 m/s. Peak gradient 99 mmHg.  Left ventricular ejection fraction, by estimation, is 65 to 70%.  Left  ventricular ejection fraction by PLAX is 73 %. The left ventricle has  hyperdynamic function. The left  ventricle has no regional wall motion abnormalities. There is mild  concentric left ventricular hypertrophy. Left ventricular diastolic  parameters are consistent with Grade I diastolic dysfunction (impaired  relaxation).  2. Right ventricular systolic function is normal. The right ventricular  size is normal. There is normal pulmonary artery systolic pressure.  3. Left atrial size was severely dilated.  4. Severe mitral annular calcification. There is a small mobile density  on the left atrial side. This density was present on the echo dates  04/27/17.. The mitral valve is degenerative. Mild mitral valve  regurgitation. Mild mitral stenosis.  5. The aortic valve is normal in structure. Aortic valve regurgitation is  mild. Moderate aortic valve stenosis. Aortic valve mean gradient measures  19.0 mmHg. Aortic valve Vmax measures 2.96 m/s.  6. The inferior vena cava is normal in size with greater than 50%  respiratory variability, suggesting right atrial pressure of 3 mmHg.   Antimicrobials:  Anti-infectives (From admission, onward)   Start     Dose/Rate Route Frequency Ordered Stop   06/10/19 1330  cefTRIAXone (ROCEPHIN) 2 g in sodium chloride 0.9 % 100 mL IVPB     2 g 200 mL/hr over 30 Minutes Intravenous Every 12 hours 06/10/19 1247     06/08/19 1600  ampicillin (OMNIPEN) 2 g in sodium chloride 0.9 % 100 mL IVPB     2 g 300 mL/hr over 20 Minutes Intravenous Every 6 hours 06/08/19 1144     06/08/19 0845  ampicillin (OMNIPEN) 2 g in sodium chloride 0.9 % 100 mL IVPB     2 g 300 mL/hr over 20 Minutes Intravenous  Once 06/08/19 0835 06/08/19 1032   06/07/19 1630  cefTRIAXone (ROCEPHIN) 2 g in sodium chloride 0.9 % 100 mL IVPB  Status:  Discontinued     2 g 200 mL/hr over 30 Minutes Intravenous Every 24 hours 06/07/19 0953 06/08/19 0834   06/06/19 1630  cefTRIAXone  (ROCEPHIN) 1 g in sodium chloride 0.9 % 100 mL IVPB  Status:  Discontinued     1 g 200 mL/hr over 30 Minutes Intravenous  Once 06/06/19 1619 06/06/19 1626   06/06/19 1630  cefTRIAXone (ROCEPHIN) 1 g in sodium chloride 0.9 % 100 mL IVPB  Status:  Discontinued     1 g 200 mL/hr over 30 Minutes Intravenous Every 24 hours 06/06/19 1620 06/07/19 0953       Subjective: Patient seen and examined at bedside.  Poor historian.  Very poor oral intake as per nursing staff.  No overnight fever, vomiting reported.    Objective: Vitals:   06/12/19 1700 06/12/19 2044 06/13/19 0504 06/13/19 0506  BP: 107/60 138/86 (!) 94/55 (!) 102/52  Pulse: 93 99 100   Resp: 18 16 16    Temp:  97.7 F (36.5 C) 98.5 F (36.9 C)   TempSrc:  Oral Oral   SpO2: 94% 94% 99%   Weight:  78.9 kg    Height:        Intake/Output Summary (Last 24 hours) at 06/13/2019 0730 Last data filed at 06/13/2019 0636 Gross per 24 hour  Intake 398.5 ml  Output 0 ml  Net 398.5 ml   Filed Weights   06/09/19 2053 06/11/19 2127 06/12/19 2044  Weight: 80.7 kg 80.3  kg 78.9 kg    Examination:  General exam: No distress.  Elderly female.  Awake, hardly answers any questions.  Poor historian.   Respiratory system: Bilateral decreased breath sounds at bases with some crackles.  Cardiovascular system: S1-S2 heard, rate controlled Gastrointestinal system: Abdomen is nondistended, soft and nontender.  Bowel sounds are heard  extremities: Trace lower extremity edema present; no cyanosis or clubbing   Data Reviewed: I have personally reviewed following labs and imaging studies  CBC: Recent Labs  Lab 06/08/19 0448 06/09/19 0427 06/10/19 0833 06/11/19 0526 06/12/19 0427  WBC 8.3 9.3 8.0 8.9 10.2  NEUTROABS 5.5 6.2 5.4 6.1 6.8  HGB 14.1 15.2* 14.3 14.0 14.7  HCT 43.9 46.8* 43.5 42.7 45.5  MCV 90.3 91.1 88.1 89.3 89.7  PLT 196 245 234 240 833   Basic Metabolic Panel: Recent Labs  Lab 06/08/19 0448 06/09/19 0427  06/10/19 0833 06/11/19 0526 06/12/19 0427  NA 140 140 141 142 142  K 3.9 4.8 3.3* 4.2 3.6  CL 110 108 107 112* 110  CO2 18* 18* 20* 17* 18*  GLUCOSE 108* 133* 157* 167* 140*  BUN 9 8 7* 6* 6*  CREATININE 0.78 0.81 0.78 0.88 0.80  CALCIUM 8.9 9.4 9.4 9.0 8.9  MG 1.4* 1.5* 1.7 1.4* 1.8   GFR: Estimated Creatinine Clearance: 49.3 mL/min (by C-G formula based on SCr of 0.8 mg/dL). Liver Function Tests: Recent Labs  Lab 06/06/19 1410 06/08/19 0448 06/09/19 0427 06/10/19 0833 06/11/19 0526  AST 26 28 51* 20 21  ALT 21 23 16 20 17   ALKPHOS 63 59 59 63 56  BILITOT 0.9 0.7 2.1* 0.6 0.8  PROT 6.0* 5.6* 5.8* 6.1* 5.6*  ALBUMIN 3.2* 2.7* 2.9* 3.0* 2.9*   No results for input(s): LIPASE, AMYLASE in the last 168 hours. No results for input(s): AMMONIA in the last 168 hours. Coagulation Profile: No results for input(s): INR, PROTIME in the last 168 hours. Cardiac Enzymes: No results for input(s): CKTOTAL, CKMB, CKMBINDEX, TROPONINI in the last 168 hours. BNP (last 3 results) No results for input(s): PROBNP in the last 8760 hours. HbA1C: No results for input(s): HGBA1C in the last 72 hours. CBG: Recent Labs  Lab 06/12/19 0648 06/12/19 1144 06/12/19 1701 06/12/19 2113 06/13/19 0648  GLUCAP 136* 146* 139* 131* 157*   Lipid Profile: No results for input(s): CHOL, HDL, LDLCALC, TRIG, CHOLHDL, LDLDIRECT in the last 72 hours. Thyroid Function Tests: No results for input(s): TSH, T4TOTAL, FREET4, T3FREE, THYROIDAB in the last 72 hours. Anemia Panel: No results for input(s): VITAMINB12, FOLATE, FERRITIN, TIBC, IRON, RETICCTPCT in the last 72 hours. Sepsis Labs: Recent Labs  Lab 06/06/19 1410 06/06/19 1645 06/07/19 1027 06/08/19 0448 06/09/19 0427  PROCALCITON  --   --  <0.10 <0.10 <0.10  LATICACIDVEN 3.4* 2.8*  --   --   --     Recent Results (from the past 240 hour(s))  Urine Culture     Status: Abnormal   Collection Time: 06/06/19  3:03 PM   Specimen: Urine,  Catheterized  Result Value Ref Range Status   Specimen Description URINE, CATHETERIZED  Final   Special Requests   Final    ADDED 2253 Performed at [facility], [place] [address], [place], [place] [address]    Culture 70,000 COLONIES/mL PROTEUS MIRABILIS (A)  Final   Report Status 06/09/2019 FINAL  Final   Organism ID, Bacteria PROTEUS MIRABILIS (A)  Final      Susceptibility   Proteus mirabilis -  MIC*    AMPICILLIN <=2 SENSITIVE Sensitive     CEFAZOLIN 8 SENSITIVE Sensitive     CEFTRIAXONE <=0.25 SENSITIVE Sensitive     CIPROFLOXACIN >=4 RESISTANT Resistant     GENTAMICIN <=1 SENSITIVE Sensitive     IMIPENEM 2 SENSITIVE Sensitive     NITROFURANTOIN 128 RESISTANT Resistant     TRIMETH/SULFA >=320 RESISTANT Resistant     AMPICILLIN/SULBACTAM <=2 SENSITIVE Sensitive     PIP/TAZO <=4 SENSITIVE Sensitive     * 70,000 COLONIES/mL PROTEUS MIRABILIS  Blood Culture (routine x 2)     Status: Abnormal   Collection Time: 06/06/19  3:20 PM   Specimen: BLOOD  Result Value Ref Range Status   Specimen Description BLOOD RIGHT ANTECUBITAL  Final   Special Requests   Final    BOTTLES DRAWN AEROBIC AND ANAEROBIC Blood Culture results may not be optimal due to an inadequate volume of blood received in culture bottles   Culture  Setup Time   Final    GRAM POSITIVE COCCI IN CHAINS ANAEROBIC BOTTLE ONLY CRITICAL RESULT CALLED TO, READ BACK BY AND VERIFIED WITH: Lorita Officer  Jeddo [id number] [place] Performed at [facility], [place] [address] [place], [place] [address]    Culture ENTEROCOCCUS FAECALIS (A)  Final   Report Status 06/09/2019 FINAL  Final   Organism ID, Bacteria ENTEROCOCCUS FAECALIS  Final      Susceptibility   Enterococcus faecalis - MIC*    AMPICILLIN <=2 SENSITIVE Sensitive     VANCOMYCIN 1 SENSITIVE Sensitive     GENTAMICIN SYNERGY SENSITIVE Sensitive     * ENTEROCOCCUS FAECALIS  Blood Culture (routine x 2)     Status: None   Collection Time: 06/06/19  3:30 PM    Specimen: BLOOD  Result Value Ref Range Status   Specimen Description BLOOD LEFT ANTECUBITAL  Final   Special Requests   Final    BOTTLES DRAWN AEROBIC AND ANAEROBIC Blood Culture results may not be optimal due to an inadequate volume of blood received in culture bottles   Culture   Final    NO GROWTH 5 DAYS Performed at [facility], [place] [address] [place], [place] [address]    Report Status 06/11/2019 FINAL  Final  SARS CORONAVIRUS 2 (TAT 6-24 HRS) Nasopharyngeal Nasopharyngeal Swab     Status: None   Collection Time: 06/06/19  5:55 PM   Specimen: Nasopharyngeal Swab  Result Value Ref Range Status   SARS Coronavirus 2 NEGATIVE NEGATIVE Final    Comment: (NOTE) SARS-CoV-2 target nucleic acids are NOT DETECTED. The SARS-CoV-2 RNA is generally detectable in upper and lower respiratory specimens during the acute phase of infection. Negative results do not preclude SARS-CoV-2 infection, do not rule out co-infections with other pathogens, and should not be used as the sole basis for treatment or other patient management decisions. Negative results must be combined with clinical observations, patient history, and epidemiological information. The expected result is Negative. Fact Sheet for Patients: SugarRoll.be Fact Sheet for Healthcare Providers: https://www.woods-mathews.com/ This test is not yet approved or cleared by the [place] FDA and  has been authorized for detection and/or diagnosis of SARS-CoV-2 by FDA under an Emergency Use Authorization (EUA). This EUA will remain  in effect (meaning this test can be used) for the duration of the COVID-19 declaration under Section 56 4(b)(1) of the Act, 21 U.S.C. section 360bbb-3(b)(1), unless the authorization is terminated or revoked sooner. Performed at [facility], [place]  [address] [place], [place] [address]   Culture, blood (routine x 2)     Status: None   Collection Time:  06/07/19 10:27 AM   Specimen: BLOOD  Result Value Ref Range Status   Specimen Description BLOOD RIGHT ANTECUBITAL  Final   Special Requests   Final    BOTTLES DRAWN AEROBIC AND ANAEROBIC Blood Culture results may not be optimal due to an inadequate volume of blood received in culture bottles   Culture   Final    NO GROWTH 5 DAYS Performed at [facility], [place] [address], [place], [place] [address]    Report Status 06/12/2019 FINAL  Final  Culture, blood (routine x 2)     Status: Abnormal   Collection Time: 06/07/19 10:28 AM   Specimen: BLOOD RIGHT ARM  Result Value Ref Range Status   Specimen Description BLOOD RIGHT ARM  Final   Special Requests   Final    BOTTLES DRAWN AEROBIC ONLY Blood Culture results may not be optimal due to an inadequate volume of blood received in culture bottles   Culture  Setup Time   Final    GRAM POSITIVE COCCI IN CHAINS AEROBIC BOTTLE ONLY CRITICAL RESULT CALLED TO, READ BACK BY AND VERIFIED WITH: PHARMD J STEENWYK [id number] AT 826 BY CM    Culture (A)  Final    ENTEROCOCCUS FAECALIS SUSCEPTIBILITIES PERFORMED ON PREVIOUS CULTURE WITHIN THE LAST 5 DAYS. Performed at [facility], [place] [address] [place], [place] [address]    Report Status 06/09/2019 FINAL  Final  Blood Culture ID Panel (Reflexed)     Status: Abnormal   Collection Time: 06/07/19 10:28 AM  Result Value Ref Range Status   Enterococcus species DETECTED (A) NOT DETECTED Final    Comment: CRITICAL RESULT CALLED TO, READ BACK BY AND VERIFIED WITH: PHARMD J STEENWKY [id number] AT 825 AM BY CM    Vancomycin resistance NOT DETECTED NOT DETECTED Final   Listeria monocytogenes NOT DETECTED NOT DETECTED Final   Staphylococcus species NOT DETECTED NOT DETECTED Final   Staphylococcus aureus (BCID) NOT DETECTED NOT DETECTED Final   Streptococcus species NOT DETECTED NOT DETECTED Final   Streptococcus agalactiae NOT DETECTED NOT DETECTED Final   Streptococcus pneumoniae NOT DETECTED NOT  DETECTED Final   Streptococcus pyogenes NOT DETECTED NOT DETECTED Final   Acinetobacter baumannii NOT DETECTED NOT DETECTED Final   Enterobacteriaceae species NOT DETECTED NOT DETECTED Final   Enterobacter cloacae complex NOT DETECTED NOT DETECTED Final   Escherichia coli NOT DETECTED NOT DETECTED Final   Klebsiella oxytoca NOT DETECTED NOT DETECTED Final   Klebsiella pneumoniae NOT DETECTED NOT DETECTED Final   Proteus species NOT DETECTED NOT DETECTED Final   Serratia marcescens NOT DETECTED NOT DETECTED Final   Haemophilus influenzae NOT DETECTED NOT DETECTED Final   Neisseria meningitidis NOT DETECTED NOT DETECTED Final   Pseudomonas aeruginosa NOT DETECTED NOT DETECTED Final   Candida albicans NOT DETECTED NOT DETECTED Final   Candida glabrata NOT DETECTED NOT DETECTED Final   Candida krusei NOT DETECTED NOT DETECTED Final   Candida parapsilosis NOT DETECTED NOT DETECTED Final   Candida tropicalis NOT DETECTED NOT DETECTED Final    Comment: Performed at [facility], [address], [place], [place] [address]  MRSA PCR Screening     Status: Abnormal   Collection Time: 06/08/19 12:11 AM   Specimen: Nasal Mucosa; Nasopharyngeal  Result Value Ref Range Status   MRSA by PCR POSITIVE (A) NEGATIVE Final  Comment:        The GeneXpert MRSA Assay (FDA approved for NASAL specimens only), is one component of a comprehensive MRSA colonization surveillance program. It is not intended to diagnose MRSA infection nor to guide or monitor treatment for MRSA infections. RESULT CALLED TO, READ BACK BY AND VERIFIED WITH: MAHMOUD,Z RN 06/08/2019 AT 5366 SKEEN,P Performed at [facility], [place] [address], [place], [place] [address]   Culture, blood (routine x 2)     Status: None (Preliminary result)   Collection Time: 06/08/19  8:07 PM   Specimen: BLOOD  Result Value Ref Range Status   Specimen Description BLOOD RIGHT ANTECUBITAL  Final   Special Requests   Final    BOTTLES  DRAWN AEROBIC AND ANAEROBIC Blood Culture adequate volume   Culture   Final    NO GROWTH 4 DAYS Performed at [facility], [place] [address], [place], [place] [address]    Report Status PENDING  Incomplete  Culture, blood (routine x 2)     Status: None (Preliminary result)   Collection Time: 06/08/19  8:15 PM   Specimen: BLOOD LEFT HAND  Result Value Ref Range Status   Specimen Description BLOOD LEFT HAND  Final   Special Requests   Final    BOTTLES DRAWN AEROBIC ONLY Blood Culture adequate volume   Culture   Final    NO GROWTH 4 DAYS Performed at [facility], [place] [address], [place], [place] [address]    Report Status PENDING  Incomplete         Radiology Studies: [place] EKG SITE RITE  Result Date: 06/11/2019 If Site Rite image not attached, placement could not be confirmed due to current cardiac rhythm.       Scheduled Meds: . allopurinol  200 mg Oral Daily  . apixaban  5 mg Oral BID  . atorvastatin  10 mg Oral QHS  . famotidine  20 mg Oral BID  . insulin aspart  0-9 Units Subcutaneous TID WC  . isosorbide mononitrate  60 mg Oral Daily  . topiramate  25 mg Oral QHS  . vitamin B-12  1,000 mcg Oral Daily   Continuous Infusions: . ampicillin (OMNIPEN) IV 2 g (06/13/19 0548)  . cefTRIAXone (ROCEPHIN)  IV 2 g (06/12/19 2144)          Aline August, MD [facility] 06/13/2019, 7:30 AM

## 2019-06-14 DIAGNOSIS — Z515 Encounter for palliative care: Secondary | ICD-10-CM

## 2019-06-14 DIAGNOSIS — R627 Adult failure to thrive: Secondary | ICD-10-CM

## 2019-06-14 DIAGNOSIS — G9341 Metabolic encephalopathy: Secondary | ICD-10-CM

## 2019-06-14 LAB — GLUCOSE, CAPILLARY
Glucose-Capillary: 176 mg/dL — ABNORMAL HIGH (ref 70–99)
Glucose-Capillary: 170 mg/dL — ABNORMAL HIGH (ref 70–99)

## 2019-06-14 MED ORDER — SODIUM CHLORIDE 0.9 % IV SOLN: INTRAVENOUS | Status: DC

## 2019-06-14 NOTE — Progress Notes (Signed)
Manufacturing engineer [facility])  Referral received for residential hospice at [facility]   Will update TOC manager and family once bed status is confirmed.  Venia Carbon RN, BSN, [facility] Liaison

## 2019-06-14 NOTE — Consult Note (Signed)
   [facility] CM Inpatient Consult   06/14/2019  Kara Ortega 23-Mar-1930 [id number]   Patient screened for disposition with length of stay review for hospitalization and  to determine if potential [place] with [facility] for Care Management needs. Review of patient's medical record reveals patient is for comfort care at [facility] per inpatient Transition of Care [facility] and Palliative consult notes.   Plan: Will sign off at disposition for transition.  For questions contact:   Natividad Brood, RN BSN [facility] Liaison  [phone] business mobile phone Toll free office [phone]  Fax number: [phone] [email or web address]

## 2019-06-14 NOTE — Discharge Summary (Signed)
Physician Discharge Summary  Kara Ortega CXK:[id number] DOB: 12/27/1929 DOA: 06/06/2019  PCP: Carol Ada, MD  Admit date: 06/06/2019 Discharge date: 06/14/2019  Admitted From: SNF Disposition: Residential hospice  Recommendations for Outpatient Follow-up:  Follow up with residential hospice at earliest convenience on discharge   Home Health: No Equipment/Devices: None  Discharge Condition: Poor CODE STATUS: DNR Diet recommendation: As per comfort measures  Brief/Interim Summary: 86 year old female with history of permanent A. fib, chronic kidney disease stage II, advanced dementia, diabetes mellitus type 2 on insulin, hypertension, nonhemorrhagic stroke with left-sided hemiparesis, Covid infection in January 2021 presented from memory care nursing facility with hallucinations, generalized weakness, decreased responsiveness and dark/cloudy urine and admitted for dehydration, AKI, toxic encephalopathy due to UTI and started on IV antibiotics and fluids.  She was found to have Enterococcus faecalis bacteremia for which ID was consulted.  2D echo showed questionable mobile density on the left atrial side for which cardiology was also consulted.  Palliative care was also consulted.  ID recommended 6 weeks of IV Rocephin and ampicillin.  During the hospitalization, patient's overall oral intake did not improve at all.  Palliative care had multiple discussions with patient's family and family has agreed for residential hospice.  She will be discharged to residential hospice once bed is available.  Antibiotics will be discontinued.  Discharge Diagnoses:  Enterococcus faecalis bacteremia: Present on admission UTI Acute metabolic encephalopathy History of dementia Dehydration Acute kidney injury Permanent A. Fib History of nonhemorrhagic stroke with mild left-sided weakness Chronic diastolic CHF Hypertension Dyslipidemia History of gout Diabetes mellitus type 2 Generalized  deconditioning/overall very poor prognosis  Plan -Patient has been on IV antibiotic as per ID recommendations.  ID recommended 6 weeks of IV antibiotics with Rocephin and ampicillin. -During the hospitalization, patient's overall oral intake did not improve at all.  Palliative care had multiple discussions with patient's family and family has agreed for residential hospice.  She will be discharged to residential hospice once bed is available.  Antibiotics will be discontinued. -Till she can swallow orally, some of her oral medications can be continued including Eliquis.  Once she stops swallowing, these can be discontinued.  Blood pressure on the lower side so we will keep Lasix on hold.   Discharge Instructions   Allergies as of 06/14/2019      Reactions   Avandia [rosiglitazone] Other (See Comments)   edema   Benazepril Other (See Comments)   unknown   Dilaudid [hydromorphone Hcl] Other (See Comments)   "does not tolerate strong pain medications" per pt   Fosamax [alendronate Sodium] Nausea And Vomiting   Talwin [pentazocine] Nausea And Vomiting   Tetanus Toxoids Other (See Comments)   "allergic to something in the tetanus injection" per patient   Zyrtec [cetirizine] Other (See Comments)   unknown      Medication List    STOP taking these medications   allopurinol 100 MG tablet Commonly known as: ZYLOPRIM   atorvastatin 10 MG tablet Commonly known as: LIPITOR   B-D UF III MINI PEN NEEDLES 31G X 5 MM Misc Generic drug: Insulin Pen Needle   furosemide 20 MG tablet Commonly known as: LASIX   HM Vitamin D3 100 MCG (4000 UT) Caps Generic drug: Cholecalciferol   insulin NPH-regular Human (70-30) 100 UNIT/ML injection   rivastigmine 13.3 MG/24HR Commonly known as: EXELON   spironolactone 25 MG tablet Commonly known as: ALDACTONE   tiZANidine 2 MG tablet Commonly known as: ZANAFLEX   Toujeo SoloStar 300 UNIT/ML Solostar  Pen Generic drug: insulin glargine (1 Unit  Dial)   Victoza 18 MG/3ML Sopn Generic drug: liraglutide   vitamin B-12 1000 MCG tablet Commonly known as: CYANOCOBALAMIN   vitamin C 1000 MG tablet   zinc gluconate 50 MG tablet     TAKE these medications   ALPRAZolam 0.25 MG tablet Commonly known as: XANAX Take 1 tablet (0.25 mg total) by mouth every 12 (twelve) hours as needed for anxiety or sleep.   apixaban 5 MG Tabs tablet Commonly known as: Eliquis Take 1 tablet (5 mg total) by mouth 2 (two) times daily.   famotidine 10 MG tablet Commonly known as: PEPCID Take 20 mg by mouth 2 (two) times daily.   isosorbide mononitrate 60 MG 24 hr tablet Commonly known as: IMDUR Take 1 tablet (60 mg total) by mouth daily.   nitroGLYCERIN 0.4 MG SL tablet Commonly known as: NITROSTAT Place 1 tablet (0.4 mg total) under the tongue every 5 (five) minutes as needed for chest pain.   pregabalin 100 MG capsule Commonly known as: Lyrica Take 1 capsule (100 mg total) by mouth 2 (two) times daily.   topiramate 25 MG tablet Commonly known as: TOPAMAX Take 1 tablet (25 mg total) by mouth at bedtime.   traMADol 50 MG tablet Commonly known as: ULTRAM Take 0.5 tablets (25 mg total) by mouth every 8 (eight) hours as needed for moderate pain.       Allergies  Allergen Reactions  . Avandia [Rosiglitazone] Other (See Comments)    edema  . Benazepril Other (See Comments)    unknown  . Dilaudid [Hydromorphone Hcl] Other (See Comments)    "does not tolerate strong pain medications" per pt  . Fosamax [Alendronate Sodium] Nausea And Vomiting  . Talwin [Pentazocine] Nausea And Vomiting  . Tetanus Toxoids Other (See Comments)    "allergic to something in the tetanus injection" per patient  . Zyrtec [Cetirizine] Other (See Comments)    unknown    Consultations: ID/palliative care/cardiology   Procedures/Studies: DG Chest Port 1 View  Result Date: 06/06/2019 CLINICAL DATA:  Weakness. Shortness of breath. Altered mental status.  History of dementia. EXAM: PORTABLE CHEST 1 VIEW COMPARISON:  Radiographs 05/05/2019 and 04/16/2019. CT 05/05/2019. FINDINGS: 1517 hours. The heart size and mediastinal contours are stable. There is aortic atherosclerosis with aortic valvular and mitral annular calcifications. Left-greater-than-right bibasilar pulmonary opacities have not significantly changed, likely postinflammatory scarring. No superimposed airspace disease, edema, significant pleural effusion or pneumothorax. Telemetry leads overlie the chest. Stable mild thoracolumbar scoliosis. IMPRESSION: Stable chest with chronic bibasilar opacities, likely postinflammatory scarring. No acute cardiopulmonary process. Electronically Signed   By: Richardean Sale M.D.   On: 06/06/2019 15:35   ECHOCARDIOGRAM COMPLETE  Result Date: 06/09/2019    ECHOCARDIOGRAM REPORT   Patient Name:   Kara Ortega Date of Exam: 06/09/2019 Medical Rec #:  [id number]       Height:       63.0 in Accession #:    [id number]      Weight:       178.0 lb Date of Birth:  [birth year]       BSA:          1.840 m Patient Age:    86 years        BP:           111/61 mmHg Patient Gender: F               HR:  87 bpm. Exam Location:  Inpatient Procedure: 2D Echo, Cardiac Doppler and Color Doppler Indications:    Bacteremia 790.7 / R78.81  History:        Patient has prior history of Echocardiogram examinations, most                 recent 04/27/2017. CHF, Stroke and COPD, Arrythmias:Atrial                 Fibrillation, Signs/Symptoms:Dyspnea and Chest Pain; Risk                 Factors:Hypertension, Diabetes, Dyslipidemia and Sleep Apnea.                 CKD.  Sonographer:    Tiffany Dance Referring Phys: [id number] [facility]  1. Intracavitary gradient. Peak velocity 4.97 m/s. Peak gradient 99 mmHg. Left ventricular ejection fraction, by estimation, is 65 to 70%. Left ventricular ejection fraction by PLAX is 73 %. The left ventricle has hyperdynamic function. The left  ventricle has no regional wall motion abnormalities. There is mild concentric left ventricular hypertrophy. Left ventricular diastolic parameters are consistent with Grade I diastolic dysfunction (impaired relaxation).  2. Right ventricular systolic function is normal. The right ventricular size is normal. There is normal pulmonary artery systolic pressure.  3. Left atrial size was severely dilated.  4. Severe mitral annular calcification. There is a small mobile density on the left atrial side. This density was present on the echo dates 04/27/17.. The mitral valve is degenerative. Mild mitral valve regurgitation. Mild mitral stenosis.  5. The aortic valve is normal in structure. Aortic valve regurgitation is mild. Moderate aortic valve stenosis. Aortic valve mean gradient measures 19.0 mmHg. Aortic valve Vmax measures 2.96 m/s.  6. The inferior vena cava is normal in size with greater than 50% respiratory variability, suggesting right atrial pressure of 3 mmHg. FINDINGS  Left Ventricle: Intracavitary gradient. Peak velocity 4.97 m/s. Peak gradient 99 mmHg. Left ventricular ejection fraction, by estimation, is 65 to 70%. Left ventricular ejection fraction by PLAX is 73 % The left ventricle has hyperdynamic function. The left ventricle has no regional wall motion abnormalities. The left ventricular internal cavity size was normal in size. There is mild concentric left ventricular hypertrophy. Left ventricular diastolic parameters are consistent with Grade I diastolic dysfunction (impaired relaxation). Right Ventricle: The right ventricular size is normal. No increase in right ventricular wall thickness. Right ventricular systolic function is normal. There is normal pulmonary artery systolic pressure. The tricuspid regurgitant velocity is 2.17 m/s, and  with an assumed right atrial pressure of 3 mmHg, the estimated right ventricular systolic pressure is 32.4 mmHg. Left Atrium: Left atrial size was severely dilated.  Right Atrium: Right atrial size was normal in size. Pericardium: There is no evidence of pericardial effusion. Mitral Valve: Severe mitral annular calcification. There is a small mobile density on the left atrial side. This density was present on the echo dates 04/27/17. The mitral valve is degenerative in appearance. Normal mobility of the mitral valve leaflets. Severe mitral annular calcification. Mild mitral valve regurgitation. Mild mitral valve stenosis. MV peak gradient, 20.2 mmHg. The mean mitral valve gradient is 5.0 mmHg. Tricuspid Valve: The tricuspid valve is normal in structure. Tricuspid valve regurgitation is mild . No evidence of tricuspid stenosis. Aortic Valve: The aortic valve is normal in structure.. There is moderate thickening and moderate calcification of the aortic valve. Aortic valve regurgitation is mild. Moderate aortic stenosis is present. There  is moderate thickening of the aortic valve. There is moderate calcification of the aortic valve. Aortic valve mean gradient measures 19.0 mmHg. Aortic valve peak gradient measures 35.2 mmHg. Aortic valve area, by VTI measures 1.41 cm. Pulmonic Valve: The pulmonic valve was normal in structure. Pulmonic valve regurgitation is not visualized. No evidence of pulmonic stenosis. Aorta: The aortic root is normal in size and structure. Venous: The inferior vena cava is normal in size with greater than 50% respiratory variability, suggesting right atrial pressure of 3 mmHg. IAS/Shunts: No atrial level shunt detected by color flow Doppler.  LEFT VENTRICLE PLAX 2D LV EF:         Left ventricular ejection fraction by PLAX is 73 % LVIDd:         2.50 cm LVIDs:         1.50 cm LV PW:         1.29 cm LV IVS:        1.20 cm LVOT diam:     1.90 cm LV SV:         70 LV SV Index:   38 LVOT Area:     2.84 cm  RIGHT VENTRICLE             IVC RV Basal diam:  1.80 cm     IVC diam: 1.60 cm RV S prime:     20.20 cm/s TAPSE (M-mode): 2.0 cm LEFT ATRIUM             Index        RIGHT ATRIUM          Index LA diam:        4.20 cm 2.28 cm/m  RA Area:     7.29 cm LA Vol (A2C):   62.0 ml 33.69 ml/m RA Volume:   10.70 ml 5.81 ml/m LA Vol (A4C):   85.3 ml 46.35 ml/m LA Biplane Vol: 75.3 ml 40.92 ml/m  AORTIC VALVE AV Area (Vmax):    1.45 cm AV Area (Vmean):   1.50 cm AV Area (VTI):     1.41 cm AV Vmax:           296.50 cm/s AV Vmean:          201.500 cm/s AV VTI:            0.497 m AV Peak Grad:      35.2 mmHg AV Mean Grad:      19.0 mmHg LVOT Vmax:         152.00 cm/s LVOT Vmean:        106.500 cm/s LVOT VTI:          0.246 m LVOT/AV VTI ratio: 0.50  AORTA Ao Root diam: 3.20 cm Ao Asc diam:  3.20 cm MITRAL VALVE                TRICUSPID VALVE MV Area (PHT): 1.38 cm     TR Peak grad:   18.8 mmHg MV Peak grad:  20.2 mmHg    TR Vmax:        217.00 cm/s MV Mean grad:  5.0 mmHg MV Vmax:       2.25 m/s     SHUNTS MV Vmean:      103.0 cm/s   Systemic VTI:  0.25 m MV Decel Time: 549 msec     Systemic Diam: 1.90 cm MV E velocity: 90.00 cm/s MV A velocity: 207.00 cm/s MV E/A ratio:  0.43 Skeet Latch MD Electronically signed  by Skeet Latch MD Signature Date/Time: 06/09/2019/3:27:58 PM    Final    [place] EKG SITE RITE  Result Date: 06/11/2019 If Site Rite image not attached, placement could not be confirmed due to current cardiac rhythm.      Subjective: Patient seen and examined at bedside.  Poor historian.  Sleepy, wakes up only very slightly, hardly answers any questions.  Overall oral intake is still poor as per nursing staff.  No overnight fever or vomiting reported.    Discharge Exam: Vitals:   06/14/19 0449 06/14/19 0900  BP: 102/64 99/62  Pulse: 85 90  Resp: 17 16  Temp: 99.3 F (37.4 C) 99 F (37.2 C)  SpO2: 94% 96%    General exam: No acute distress.  Elderly female.  Sleepy, wakes up only very slightly, hardly answers any questions. Extremely poor historian. Respiratory system: Bilateral decreased breath sounds at bases with some scattered crackles.   No wheezing  cardiovascular system: Rate controlled, S1-S2 heard Gastrointestinal system: Abdomen is nondistended, soft and nontender.  Normal bowel sounds are heard  extremities: No clubbing; trace lower extremity edema present    The results of significant diagnostics from this hospitalization (including imaging, microbiology, ancillary and laboratory) are listed below for reference.     Microbiology: Recent Results (from the past 240 hour(s))  Urine Culture     Status: Abnormal   Collection Time: 06/06/19  3:03 PM   Specimen: Urine, Catheterized  Result Value Ref Range Status   Specimen Description URINE, CATHETERIZED  Final   Special Requests   Final    ADDED 2253 Performed at [facility], [place] [address] [place], [place] [address]    Culture 70,000 COLONIES/mL PROTEUS MIRABILIS (A)  Final   Report Status 06/09/2019 FINAL  Final   Organism ID, Bacteria PROTEUS MIRABILIS (A)  Final      Susceptibility   Proteus mirabilis - MIC*    AMPICILLIN <=2 SENSITIVE Sensitive     CEFAZOLIN 8 SENSITIVE Sensitive     CEFTRIAXONE <=0.25 SENSITIVE Sensitive     CIPROFLOXACIN >=4 RESISTANT Resistant     GENTAMICIN <=1 SENSITIVE Sensitive     IMIPENEM 2 SENSITIVE Sensitive     NITROFURANTOIN 128 RESISTANT Resistant     TRIMETH/SULFA >=320 RESISTANT Resistant     AMPICILLIN/SULBACTAM <=2 SENSITIVE Sensitive     PIP/TAZO <=4 SENSITIVE Sensitive     * 70,000 COLONIES/mL PROTEUS MIRABILIS  Blood Culture (routine x 2)     Status: Abnormal   Collection Time: 06/06/19  3:20 PM   Specimen: BLOOD  Result Value Ref Range Status   Specimen Description BLOOD RIGHT ANTECUBITAL  Final   Special Requests   Final    BOTTLES DRAWN AEROBIC AND ANAEROBIC Blood Culture results may not be optimal due to an inadequate volume of blood received in culture bottles   Culture  Setup Time   Final    GRAM POSITIVE COCCI IN CHAINS ANAEROBIC BOTTLE ONLY CRITICAL RESULT CALLED TO, READ BACK BY AND VERIFIED  WITH: [place] [id number] FCP Performed at [facility], [place] [address], [place], [place] [address]    Culture ENTEROCOCCUS FAECALIS (A)  Final   Report Status 06/09/2019 FINAL  Final   Organism ID, Bacteria ENTEROCOCCUS FAECALIS  Final      Susceptibility   Enterococcus faecalis - MIC*    AMPICILLIN <=2 SENSITIVE Sensitive     VANCOMYCIN 1 SENSITIVE Sensitive     GENTAMICIN SYNERGY SENSITIVE Sensitive     *  ENTEROCOCCUS FAECALIS  Blood Culture (routine x 2)     Status: None   Collection Time: 06/06/19  3:30 PM   Specimen: BLOOD  Result Value Ref Range Status   Specimen Description BLOOD LEFT ANTECUBITAL  Final   Special Requests   Final    BOTTLES DRAWN AEROBIC AND ANAEROBIC Blood Culture results may not be optimal due to an inadequate volume of blood received in culture bottles   Culture   Final    NO GROWTH 5 DAYS Performed at [facility], [place] [address], [place], [place] [address]    Report Status 06/11/2019 FINAL  Final  SARS CORONAVIRUS 2 (TAT 6-24 HRS) Nasopharyngeal Nasopharyngeal Swab     Status: None   Collection Time: 06/06/19  5:55 PM   Specimen: Nasopharyngeal Swab  Result Value Ref Range Status   SARS Coronavirus 2 NEGATIVE NEGATIVE Final    Comment: (NOTE) SARS-CoV-2 target nucleic acids are NOT DETECTED. The SARS-CoV-2 RNA is generally detectable in upper and lower respiratory specimens during the acute phase of infection. Negative results do not preclude SARS-CoV-2 infection, do not rule out co-infections with other pathogens, and should not be used as the sole basis for treatment or other patient management decisions. Negative results must be combined with clinical observations, patient history, and epidemiological information. The expected result is Negative. Fact Sheet for Patients: SugarRoll.be Fact Sheet for Healthcare Providers: https://www.woods-mathews.com/ This test is not yet  approved or cleared by the [place] FDA and  has been authorized for detection and/or diagnosis of SARS-CoV-2 by FDA under an Emergency Use Authorization (EUA). This EUA will remain  in effect (meaning this test can be used) for the duration of the COVID-19 declaration under Section 56 4(b)(1) of the Act, 21 U.S.C. section 360bbb-3(b)(1), unless the authorization is terminated or revoked sooner. Performed at [facility], [place] [address], [place], [place] [address]   Culture, blood (routine x 2)     Status: None   Collection Time: 06/07/19 10:27 AM   Specimen: BLOOD  Result Value Ref Range Status   Specimen Description BLOOD RIGHT ANTECUBITAL  Final   Special Requests   Final    BOTTLES DRAWN AEROBIC AND ANAEROBIC Blood Culture results may not be optimal due to an inadequate volume of blood received in culture bottles   Culture   Final    NO GROWTH 5 DAYS Performed at [facility], [place] [address], [place], [place] [address]    Report Status 06/12/2019 FINAL  Final  Culture, blood (routine x 2)     Status: Abnormal   Collection Time: 06/07/19 10:28 AM   Specimen: BLOOD RIGHT ARM  Result Value Ref Range Status   Specimen Description BLOOD RIGHT ARM  Final   Special Requests   Final    BOTTLES DRAWN AEROBIC ONLY Blood Culture results may not be optimal due to an inadequate volume of blood received in culture bottles   Culture  Setup Time   Final    GRAM POSITIVE COCCI IN CHAINS AEROBIC BOTTLE ONLY CRITICAL RESULT CALLED TO, READ BACK BY AND VERIFIED WITH: PHARMD J STEENWYK [id number] AT 826 BY CM    Culture (A)  Final    ENTEROCOCCUS FAECALIS SUSCEPTIBILITIES PERFORMED ON PREVIOUS CULTURE WITHIN THE LAST 5 DAYS. Performed at [facility], [place] [address] [place], [place] [address]    Report Status 06/09/2019 FINAL  Final  Blood Culture ID Panel (Reflexed)     Status: Abnormal  Collection Time: 06/07/19 10:28 AM  Result Value Ref Range Status   Enterococcus  species DETECTED (A) NOT DETECTED Final    Comment: CRITICAL RESULT CALLED TO, READ BACK BY AND VERIFIED WITH: PHARMD J STEENWKY [id number] AT 825 AM BY CM    Vancomycin resistance NOT DETECTED NOT DETECTED Final   Listeria monocytogenes NOT DETECTED NOT DETECTED Final   Staphylococcus species NOT DETECTED NOT DETECTED Final   Staphylococcus aureus (BCID) NOT DETECTED NOT DETECTED Final   Streptococcus species NOT DETECTED NOT DETECTED Final   Streptococcus agalactiae NOT DETECTED NOT DETECTED Final   Streptococcus pneumoniae NOT DETECTED NOT DETECTED Final   Streptococcus pyogenes NOT DETECTED NOT DETECTED Final   Acinetobacter baumannii NOT DETECTED NOT DETECTED Final   Enterobacteriaceae species NOT DETECTED NOT DETECTED Final   Enterobacter cloacae complex NOT DETECTED NOT DETECTED Final   Escherichia coli NOT DETECTED NOT DETECTED Final   Klebsiella oxytoca NOT DETECTED NOT DETECTED Final   Klebsiella pneumoniae NOT DETECTED NOT DETECTED Final   Proteus species NOT DETECTED NOT DETECTED Final   Serratia marcescens NOT DETECTED NOT DETECTED Final   Haemophilus influenzae NOT DETECTED NOT DETECTED Final   Neisseria meningitidis NOT DETECTED NOT DETECTED Final   Pseudomonas aeruginosa NOT DETECTED NOT DETECTED Final   Candida albicans NOT DETECTED NOT DETECTED Final   Candida glabrata NOT DETECTED NOT DETECTED Final   Candida krusei NOT DETECTED NOT DETECTED Final   Candida parapsilosis NOT DETECTED NOT DETECTED Final   Candida tropicalis NOT DETECTED NOT DETECTED Final    Comment: Performed at [facility], [address] [place], [place] [address]  MRSA PCR Screening     Status: Abnormal   Collection Time: 06/08/19 12:11 AM   Specimen: Nasal Mucosa; Nasopharyngeal  Result Value Ref Range Status   MRSA by PCR POSITIVE (A) NEGATIVE Final    Comment:        The GeneXpert MRSA Assay (FDA approved for NASAL specimens only), is one component of a comprehensive MRSA  colonization surveillance program. It is not intended to diagnose MRSA infection nor to guide or monitor treatment for MRSA infections. RESULT CALLED TO, READ BACK BY AND VERIFIED WITH: MAHMOUD,Z RN 06/08/2019 AT 1448 SKEEN,P Performed at [facility], [place] [address], [place], [place] [address]   Culture, blood (routine x 2)     Status: None   Collection Time: 06/08/19  8:07 PM   Specimen: BLOOD  Result Value Ref Range Status   Specimen Description BLOOD RIGHT ANTECUBITAL  Final   Special Requests   Final    BOTTLES DRAWN AEROBIC AND ANAEROBIC Blood Culture adequate volume   Culture NO GROWTH 5 DAYS  Final   Report Status 06/13/2019 FINAL  Final  Culture, blood (routine x 2)     Status: None   Collection Time: 06/08/19  8:15 PM   Specimen: BLOOD LEFT HAND  Result Value Ref Range Status   Specimen Description BLOOD LEFT HAND  Final   Special Requests   Final    BOTTLES DRAWN AEROBIC ONLY Blood Culture adequate volume   Culture NO GROWTH 5 DAYS  Final   Report Status 06/13/2019 FINAL  Final     Labs: BNP (last 3 results) Recent Labs    06/08/19 0448  BNP 149.7*   Basic Metabolic Panel: Recent Labs  Lab 06/08/19 0448 06/09/19 0427 06/10/19 0833 06/11/19 0526 06/12/19 0427  NA 140 140 141 142 142  K 3.9 4.8 3.3* 4.2 3.6  CL 110 108 107 112* 110  CO2 18* 18* 20* 17* 18*  GLUCOSE 108* 133* 157* 167* 140*  BUN 9 8 7* 6* 6*  CREATININE 0.78 0.81 0.78 0.88 0.80  CALCIUM 8.9 9.4 9.4 9.0 8.9  MG 1.4* 1.5* 1.7 1.4* 1.8   Liver Function Tests: Recent Labs  Lab 06/08/19 0448 06/09/19 0427 06/10/19 0833 06/11/19 0526  AST 28 51* 20 21  ALT 23 16 20 17   ALKPHOS 59 59 63 56  BILITOT 0.7 2.1* 0.6 0.8  PROT 5.6* 5.8* 6.1* 5.6*  ALBUMIN 2.7* 2.9* 3.0* 2.9*   No results for input(s): LIPASE, AMYLASE in the last 168 hours. No results for input(s): AMMONIA in the last 168 hours. CBC: Recent Labs  Lab 06/08/19 0448 06/09/19 0427 06/10/19 0833 06/11/19 0526  06/12/19 0427  WBC 8.3 9.3 8.0 8.9 10.2  NEUTROABS 5.5 6.2 5.4 6.1 6.8  HGB 14.1 15.2* 14.3 14.0 14.7  HCT 43.9 46.8* 43.5 42.7 45.5  MCV 90.3 91.1 88.1 89.3 89.7  PLT 196 245 234 240 250   Cardiac Enzymes: No results for input(s): CKTOTAL, CKMB, CKMBINDEX, TROPONINI in the last 168 hours. BNP: Invalid input(s): POCBNP CBG: Recent Labs  Lab 06/13/19 1118 06/13/19 1600 06/13/19 2057 06/14/19 0712 06/14/19 1119  GLUCAP 79 159* 164* 176* 170*   D-Dimer No results for input(s): DDIMER in the last 72 hours. Hgb A1c No results for input(s): HGBA1C in the last 72 hours. Lipid Profile No results for input(s): CHOL, HDL, LDLCALC, TRIG, CHOLHDL, LDLDIRECT in the last 72 hours. Thyroid function studies No results for input(s): TSH, T4TOTAL, T3FREE, THYROIDAB in the last 72 hours.  Invalid input(s): FREET3 Anemia work up No results for input(s): VITAMINB12, FOLATE, FERRITIN, TIBC, IRON, RETICCTPCT in the last 72 hours. Urinalysis    Component Value Date/Time   COLORURINE YELLOW 06/06/2019 1503   APPEARANCEUR CLOUDY (A) 06/06/2019 1503   LABSPEC 1.011 06/06/2019 1503   PHURINE 5.0 06/06/2019 1503   GLUCOSEU NEGATIVE 06/06/2019 1503   HGBUR NEGATIVE 06/06/2019 1503   BILIRUBINUR NEGATIVE 06/06/2019 1503   KETONESUR NEGATIVE 06/06/2019 1503   PROTEINUR 30 (A) 06/06/2019 1503   UROBILINOGEN 0.2 03/23/2014 1904   NITRITE NEGATIVE 06/06/2019 1503   LEUKOCYTESUR LARGE (A) 06/06/2019 1503   Sepsis Labs Invalid input(s): PROCALCITONIN,  WBC,  LACTICIDVEN Microbiology Recent Results (from the past 240 hour(s))  Urine Culture     Status: Abnormal   Collection Time: 06/06/19  3:03 PM   Specimen: Urine, Catheterized  Result Value Ref Range Status   Specimen Description URINE, CATHETERIZED  Final   Special Requests   Final    ADDED 2253 Performed at [facility], [place] [address] [place], [place] [address]    Culture 70,000 COLONIES/mL PROTEUS MIRABILIS (A)  Final   Report  Status 06/09/2019 FINAL  Final   Organism ID, Bacteria PROTEUS MIRABILIS (A)  Final      Susceptibility   Proteus mirabilis - MIC*    AMPICILLIN <=2 SENSITIVE Sensitive     CEFAZOLIN 8 SENSITIVE Sensitive     CEFTRIAXONE <=0.25 SENSITIVE Sensitive     CIPROFLOXACIN >=4 RESISTANT Resistant     GENTAMICIN <=1 SENSITIVE Sensitive     IMIPENEM 2 SENSITIVE Sensitive     NITROFURANTOIN 128 RESISTANT Resistant     TRIMETH/SULFA >=320 RESISTANT Resistant     AMPICILLIN/SULBACTAM <=2 SENSITIVE Sensitive     PIP/TAZO <=4 SENSITIVE Sensitive     * 70,000 COLONIES/mL PROTEUS MIRABILIS  Blood Culture (  routine x 2)     Status: Abnormal   Collection Time: 06/06/19  3:20 PM   Specimen: BLOOD  Result Value Ref Range Status   Specimen Description BLOOD RIGHT ANTECUBITAL  Final   Special Requests   Final    BOTTLES DRAWN AEROBIC AND ANAEROBIC Blood Culture results may not be optimal due to an inadequate volume of blood received in culture bottles   Culture  Setup Time   Final    GRAM POSITIVE COCCI IN CHAINS ANAEROBIC BOTTLE ONLY CRITICAL RESULT CALLED TO, READ BACK BY AND VERIFIED WITH: Lorita Officer  [place] [id number] [place] Performed at [facility], [place] [address] [place], [place] [address]    Culture ENTEROCOCCUS FAECALIS (A)  Final   Report Status 06/09/2019 FINAL  Final   Organism ID, Bacteria ENTEROCOCCUS FAECALIS  Final      Susceptibility   Enterococcus faecalis - MIC*    AMPICILLIN <=2 SENSITIVE Sensitive     VANCOMYCIN 1 SENSITIVE Sensitive     GENTAMICIN SYNERGY SENSITIVE Sensitive     * ENTEROCOCCUS FAECALIS  Blood Culture (routine x 2)     Status: None   Collection Time: 06/06/19  3:30 PM   Specimen: BLOOD  Result Value Ref Range Status   Specimen Description BLOOD LEFT ANTECUBITAL  Final   Special Requests   Final    BOTTLES DRAWN AEROBIC AND ANAEROBIC Blood Culture results may not be optimal due to an inadequate volume of blood received in culture bottles   Culture    Final    NO GROWTH 5 DAYS Performed at [facility], [place] [address], [place], [place] [address]    Report Status 06/11/2019 FINAL  Final  SARS CORONAVIRUS 2 (TAT 6-24 HRS) Nasopharyngeal Nasopharyngeal Swab     Status: None   Collection Time: 06/06/19  5:55 PM   Specimen: Nasopharyngeal Swab  Result Value Ref Range Status   SARS Coronavirus 2 NEGATIVE NEGATIVE Final    Comment: (NOTE) SARS-CoV-2 target nucleic acids are NOT DETECTED. The SARS-CoV-2 RNA is generally detectable in upper and lower respiratory specimens during the acute phase of infection. Negative results do not preclude SARS-CoV-2 infection, do not rule out co-infections with other pathogens, and should not be used as the sole basis for treatment or other patient management decisions. Negative results must be combined with clinical observations, patient history, and epidemiological information. The expected result is Negative. Fact Sheet for Patients: SugarRoll.be Fact Sheet for Healthcare Providers: https://www.woods-mathews.com/ This test is not yet approved or cleared by the [place] FDA and  has been authorized for detection and/or diagnosis of SARS-CoV-2 by FDA under an Emergency Use Authorization (EUA). This EUA will remain  in effect (meaning this test can be used) for the duration of the COVID-19 declaration under Section 56 4(b)(1) of the Act, 21 U.S.C. section 360bbb-3(b)(1), unless the authorization is terminated or revoked sooner. Performed at [facility], [place] [address] [place], [place] [address]   Culture, blood (routine x 2)     Status: None   Collection Time: 06/07/19 10:27 AM   Specimen: BLOOD  Result Value Ref Range Status   Specimen Description BLOOD RIGHT ANTECUBITAL  Final   Special Requests   Final    BOTTLES DRAWN AEROBIC AND ANAEROBIC Blood Culture results may not be optimal due to an inadequate volume of blood received in culture  bottles   Culture   Final    NO GROWTH 5 DAYS Performed at  [facility], [place] [address], [place], [place] [address]    Report Status 06/12/2019 FINAL  Final  Culture, blood (routine x 2)     Status: Abnormal   Collection Time: 06/07/19 10:28 AM   Specimen: BLOOD RIGHT ARM  Result Value Ref Range Status   Specimen Description BLOOD RIGHT ARM  Final   Special Requests   Final    BOTTLES DRAWN AEROBIC ONLY Blood Culture results may not be optimal due to an inadequate volume of blood received in culture bottles   Culture  Setup Time   Final    GRAM POSITIVE COCCI IN CHAINS AEROBIC BOTTLE ONLY CRITICAL RESULT CALLED TO, READ BACK BY AND VERIFIED WITH: PHARMD J STEENWYK [id number] AT 826 BY CM    Culture (A)  Final    ENTEROCOCCUS FAECALIS SUSCEPTIBILITIES PERFORMED ON PREVIOUS CULTURE WITHIN THE LAST 5 DAYS. Performed at [facility], [place] [address], [place], [place] [address]    Report Status 06/09/2019 FINAL  Final  Blood Culture ID Panel (Reflexed)     Status: Abnormal   Collection Time: 06/07/19 10:28 AM  Result Value Ref Range Status   Enterococcus species DETECTED (A) NOT DETECTED Final    Comment: CRITICAL RESULT CALLED TO, READ BACK BY AND VERIFIED WITH: PHARMD J STEENWKY [id number] AT 825 AM BY CM    Vancomycin resistance NOT DETECTED NOT DETECTED Final   Listeria monocytogenes NOT DETECTED NOT DETECTED Final   Staphylococcus species NOT DETECTED NOT DETECTED Final   Staphylococcus aureus (BCID) NOT DETECTED NOT DETECTED Final   Streptococcus species NOT DETECTED NOT DETECTED Final   Streptococcus agalactiae NOT DETECTED NOT DETECTED Final   Streptococcus pneumoniae NOT DETECTED NOT DETECTED Final   Streptococcus pyogenes NOT DETECTED NOT DETECTED Final   Acinetobacter baumannii NOT DETECTED NOT DETECTED Final   Enterobacteriaceae species NOT DETECTED NOT DETECTED Final   Enterobacter cloacae complex NOT DETECTED NOT DETECTED Final   Escherichia coli NOT DETECTED NOT  DETECTED Final   Klebsiella oxytoca NOT DETECTED NOT DETECTED Final   Klebsiella pneumoniae NOT DETECTED NOT DETECTED Final   Proteus species NOT DETECTED NOT DETECTED Final   Serratia marcescens NOT DETECTED NOT DETECTED Final   Haemophilus influenzae NOT DETECTED NOT DETECTED Final   Neisseria meningitidis NOT DETECTED NOT DETECTED Final   Pseudomonas aeruginosa NOT DETECTED NOT DETECTED Final   Candida albicans NOT DETECTED NOT DETECTED Final   Candida glabrata NOT DETECTED NOT DETECTED Final   Candida krusei NOT DETECTED NOT DETECTED Final   Candida parapsilosis NOT DETECTED NOT DETECTED Final   Candida tropicalis NOT DETECTED NOT DETECTED Final    Comment: Performed at [facility], [address] [place], [place] [address]  MRSA PCR Screening     Status: Abnormal   Collection Time: 06/08/19 12:11 AM   Specimen: Nasal Mucosa; Nasopharyngeal  Result Value Ref Range Status   MRSA by PCR POSITIVE (A) NEGATIVE Final    Comment:        The GeneXpert MRSA Assay (FDA approved for NASAL specimens only), is one component of a comprehensive MRSA colonization surveillance program. It is not intended to diagnose MRSA infection nor to guide or monitor treatment for MRSA infections. RESULT CALLED TO, READ BACK BY AND VERIFIED WITH: MAHMOUD,Z RN 06/08/2019 AT 6195 SKEEN,P Performed at [facility], [place] [address] [place], [place] [address]   Culture, blood (routine x 2)     Status: None   Collection Time: 06/08/19  8:07 PM   Specimen: BLOOD  Result Value Ref Range Status   Specimen Description BLOOD RIGHT ANTECUBITAL  Final   Special Requests   Final    BOTTLES DRAWN AEROBIC AND ANAEROBIC Blood Culture adequate volume   Culture NO GROWTH 5 DAYS  Final   Report Status 06/13/2019 FINAL  Final  Culture, blood (routine x 2)     Status: None   Collection Time: 06/08/19  8:15 PM   Specimen: BLOOD LEFT HAND  Result Value Ref Range Status   Specimen Description BLOOD LEFT  HAND  Final   Special Requests   Final    BOTTLES DRAWN AEROBIC ONLY Blood Culture adequate volume   Culture NO GROWTH 5 DAYS  Final   Report Status 06/13/2019 FINAL  Final     Time coordinating discharge: 35 minutes  SIGNED:   Aline August, MD  [facility] 06/14/2019, 2:00 PM

## 2019-06-14 NOTE — Plan of Care (Signed)
  Problem: Safety: Goal: Ability to remain free from injury will improve 06/14/2019 0946 by Mariane Baumgarten, RN Outcome: Progressing 06/14/2019 0946 by Mariane Baumgarten, RN Outcome: Not Progressing 06/14/2019 0945 by Mariane Baumgarten, RN Outcome: Progressing

## 2019-06-14 NOTE — Care Management (Addendum)
11:15 Notified by Palliative that family wishes to pursue [facility]. I have reached out to [facility] to assess and check for available beds.   Gold DNR is on chart  12:30 Notified that hospice has a bed. Spoke w son Kara Ortega, he is agreeable to transfer to [facility] today. Notified MD   14:00 Spoke w daughter at bedside, agreeable to [facility] transport as soon as available to [facility]. Nurse and [facility] notified, PTAR forms on chart, PTAR called.

## 2019-06-14 NOTE — Progress Notes (Signed)
Daily Progress Note   Patient Name: Kara Ortega       Date: 06/14/2019 DOB: 1930-01-07  Age: 86 y.o. MRN#: [id number] Attending Physician: Aline August, MD Primary Care Physician: Carol Ada, MD Admit Date: 06/06/2019  Reason for Consultation/Follow-up: Establishing goals of care  Subjective: Spoke with Hanne's son [name]. Family has made decision to request comfort care and placement at [facility]. They request to continue IV fluids and antibiotics until she is discharged.   Review of Systems  Unable to perform ROS: Dementia    Length of Stay: 8  Current Medications: Scheduled Meds:  . allopurinol  200 mg Oral Daily  . apixaban  5 mg Oral BID  . famotidine  20 mg Oral BID  . insulin aspart  0-9 Units Subcutaneous TID WC  . isosorbide mononitrate  60 mg Oral Daily  . topiramate  25 mg Oral QHS    Continuous Infusions: . ampicillin (OMNIPEN) IV 2 g (06/14/19 0858)  . cefTRIAXone (ROCEPHIN)  IV 2 g (06/14/19 1009)    PRN Meds: acetaminophen, ALPRAZolam, nitroGLYCERIN, traMADol  Physical Exam Vitals and nursing note reviewed.  Constitutional:      General: She is not in acute distress.    Appearance: She is ill-appearing.  HENT:     Head: Normocephalic and atraumatic.  Cardiovascular:     Rate and Rhythm: Normal rate and regular rhythm.  Pulmonary:     Effort: Pulmonary effort is normal.     Breath sounds: No wheezing.  Skin:    Findings: Bruising present.  Neurological:     Mental Status: She is disoriented.     Motor: Weakness present.  Psychiatric:     Comments: Nonverbal, calm             Vital Signs: BP 99/62 (BP Location: Left Arm)   Pulse 90   Temp 99 F (37.2 C) (Oral)   Resp 16   Ht 5\' 3"  (1.6 m)   Wt 78.9 kg   SpO2 96%   BMI 30.82  kg/m  SpO2: SpO2: 96 % O2 Device: O2 Device: Room Air O2 Flow Rate:    Intake/output summary:   Intake/Output Summary (Last 24 hours) at 06/14/2019 1051 Last data filed at 06/14/2019 0858 Gross per 24 hour  Intake 896 ml  Output --  Net 896  ml   LBM: Last BM Date: 06/14/19 Baseline Weight: Weight: 80.7 kg Most recent weight: Weight: 78.9 kg       Palliative Assessment/Data: PPS: 10%      Patient Active Problem List   Diagnosis Date Noted  . Pressure injury of skin 06/10/2019  . Altered mental status   . Bacteremia due to Enterococcus 06/08/2019  . Advanced care planning/counseling discussion   . Goals of care, counseling/discussion   . Palliative care by specialist   . Acute lower UTI 06/06/2019  . Lactic acidosis 06/06/2019  . UTI (urinary tract infection) 06/06/2019  . Dementia without behavioral disturbance ([place])   . OSA on CPAP   . Palliative care encounter   . Acute respiratory failure due to COVID-19 ([place]) 04/13/2019  . Essential tremor 09/14/2014  . High cholesterol 05/19/2014  . Persistent atrial fibrillation ([place]) 05/19/2014  . Long-term use of high-risk medication 05/19/2014  . Accident due to mechanical fall without injury 03/24/2014  . Diastolic dysfunction with chronic heart failure ([place]) 03/24/2014  . Sinus bradycardia 03/24/2014  . Hypotension 03/24/2014  . OSA (obstructive sleep apnea) 03/24/2014  . Warfarin anticoagulation 03/24/2014  . Aortic stenosis 03/24/2014  . Mitral stenosis 03/24/2014  . DOE (dyspnea on exertion) 03/24/2014  . Recurrent falls 03/24/2014  . AKI (acute kidney injury) ([place]) 03/23/2014  . Alzheimer's dementia ([place]) 12/26/2013  . Unstable angina ([place]) 09/14/2013  . Diarrhea 09/14/2013  . Chest pain at rest- most likely GI related 09/14/2013  . CHF (congestive heart failure) ([place])   . A-fib ([place])   . Diabetes ([place])   . Dyspnea on exertion 03/25/2013    Palliative Care Assessment & Plan   Patient Profile: 86  y.o.femalewith past medical history of recent COVID hospitalization dc'd 1/21 to the [facility], Alzheimer's dementia, CKD3, OSA on CPAP,admitted on3/8/2021with altered mental status and findings of dehydration, UTI, and bacteremia- etiology unclear, TEE pending for possible endocarditis.Palliative medicine consulted for [place].  Assessment/Recommendations/Plan   Bacteremia- continue IV antibiotics until discharge- then transition to full comfort care  Dehydration, poor po intake- not eating or drinking, worsening dementia, end of life- TOC referral for residential Hospice- continue IV fluids until discharge- family is aware no IV fluids, no antibiotics at residential hospice- family requests [facility]  Goals of Care and Additional Recommendations:  Limitations on Scope of Treatment: Full Comfort Care, Minimize Medications and No Artificial Feeding  Code Status:  DNR  Prognosis:   < 2 weeks due to bacteremia, dehydration, no po intake in the setting of worsening dementia  Discharge Planning:  Hospice facility  Care plan was discussed with patient's son [name], and patient's care team.   Thank you for allowing the Palliative Medicine Team to assist in the care of this patient.   Time In: 1030 Time Out: 1125 Total Time 55 mins Prolonged Time Billed yes      Greater than 50%  of this time was spent counseling and coordinating care related to the above assessment and plan.  Mariana Kaufman, AGNP-C Palliative Medicine   Please contact Palliative Medicine Team phone at [phone] for questions and concerns.

## 2019-06-14 NOTE — Progress Notes (Signed)
OT Cancellation Note  Patient Details Name: Kara Ortega MRN: [id number] DOB: 04-20-1929   Cancelled Treatment:    Reason Eval/Treat Not Completed: Other (comment). OT orders discontinued by MD due to decline in medical status with comfort care measures pending  Britt Bottom 06/14/2019, 11:04 AM

## 2019-06-14 NOTE — Progress Notes (Addendum)
Manufacturing engineer [facility]) Hospital Liaison Note:   Received request from [place] for family interest in [facility] with request for transfer today. Chart reviewed. Spoke to patient family to confirm intere, explain services and offer a [facility] bed. Family agreeable to transfer today. CSW aware.    Registration paper work to be completed by [employer] and will update this note once done.Marland Kitchen UPDATE AT 5284: PATIENT REGISTRATION FOR [facility] COMPLETE AND PATIENT IS READY TO TRANSFER  ASAP.  Please fax discharge summary to [phone].  RN please call report to [phone].  Please arrange transport for patient to arrive before noon if possible.  Thank you.   Gar Ponto, RN [facility] Liaison  [place] are on AMION

## 2019-06-14 NOTE — Progress Notes (Signed)
Patient ID: Kara Ortega, female   DOB: 1929/11/16, 86 y.o.   MRN: [id number]  PROGRESS NOTE    Maisy Newport  CBJ:[id number] DOB: October 23, 1929 DOA: 06/06/2019 PCP: Carol Ada, MD   Brief Narrative:  86 year old female with history of permanent A. fib, chronic kidney disease stage II, advanced dementia, diabetes mellitus type 2 on insulin, hypertension, nonhemorrhagic stroke with left-sided hemiparesis, Covid infection in January 2021 presented from memory care nursing facility with hallucinations, generalized weakness, decreased responsiveness and dark/cloudy urine and admitted for dehydration, AKI, toxic encephalopathy due to UTI and started on IV antibiotics and fluids.  She was found to have Enterococcus faecalis bacteremia for which ID was consulted.  2D echo showed questionable mobile density on the left atrial side for which cardiology was also consulted.  Palliative care was also consulted.  Assessment & Plan:   Enterococcus faecalis bacteremia: Present on admission UTI -Continue ampicillin. Repeat blood cultures negative so far.   -2D echo showed EF of 65 to 70% with grade 1 diastolic dysfunction and a small mobile density on the left atrial side, which was present on the echo date 04/27/2017 apparently.  Cardiology has evaluated the patient and think that this is not a vegetation and probably is severe mitral annular calcification and recommend no further cardiac work-up.  Cardiology recommends to continue to encourage palliative care.  Cardiology has signed off -ID has recommended 6 weeks of IV Rocephin and ampicillin till 07/20/2019.   ID has signed off.  Outpatient follow-up with ID. -PICC line placement was held on 06/11/2019 as family was still deciding if they would want to pursue long-term antibiotic treatment versus residential hospice as patient is not eating or drinking well.  Acute metabolic encephalopathy: Most likely secondary to dehydration and UTI History of dementia   -Monitor mental status.  Mental status probably back to her baseline. -Fall precautions  Dehydration Acute kidney injury -From above and poor oral intake.  Treated with IV fluids.  Off IV fluids. -Encourage oral intake. -Acute kidney injury has resolved  Permanent A. Fib -Mali VASC 2 Score of > 4  -Rate controlled.  Continue Eliquis.  History of nonhemorrhagic stroke with mild left-sided weakness -Stable.  Continue Eliquis along with statin if able to take orally.  Hypertension  -Blood pressure on the lower side.  Continue Imdur.  Diuretics on hold for now.  Dyslipidemia -Continue statin  History of gout  -continue allopurinol  Chronic diastolic CHF with EF of 16% -Currently compensated.  Strict input and output.  Daily weights.  Diabetes mellitus type 2 -A1c 8.1.  Continue CBGs with SSI.  Carb modified diet  Generalized deconditioning -PT recommends SNF placement.  Social worker following.  Overall prognosis is guarded to poor.  Palliative care following.  CODE STATUS has been changed to DNR.   -Patient is not eating or drinking well and is progressively going downhill.  Overall prognosis is very poor.   Family is still deciding if they would want to pursue long-term antibiotic treatment versus residential hospice.  They had a family meeting again with palliative care on 06/13/2019: Family will have a decision mostly today. -At this point, I recommend full comfort measures/hospice.  Will not be doing daily blood work for now.   DVT prophylaxis: Eliquis Code Status: DNR Family Communication: Spoke to son/Richard Greenville on phone on 06/10/2019 Disposition Plan: SNF versus residential hospice pending family decision consultants: ID/palliative care/cardiology  Procedures:  Echo IMPRESSIONS    1. Intracavitary gradient. Peak velocity 4.97 m/s. Peak  gradient 99 mmHg.  Left ventricular ejection fraction, by estimation, is 65 to 70%. Left  ventricular ejection fraction by  PLAX is 73 %. The left ventricle has  hyperdynamic function. The left  ventricle has no regional wall motion abnormalities. There is mild  concentric left ventricular hypertrophy. Left ventricular diastolic  parameters are consistent with Grade I diastolic dysfunction (impaired  relaxation).  2. Right ventricular systolic function is normal. The right ventricular  size is normal. There is normal pulmonary artery systolic pressure.  3. Left atrial size was severely dilated.  4. Severe mitral annular calcification. There is a small mobile density  on the left atrial side. This density was present on the echo dates  04/27/17.. The mitral valve is degenerative. Mild mitral valve  regurgitation. Mild mitral stenosis.  5. The aortic valve is normal in structure. Aortic valve regurgitation is  mild. Moderate aortic valve stenosis. Aortic valve mean gradient measures  19.0 mmHg. Aortic valve Vmax measures 2.96 m/s.  6. The inferior vena cava is normal in size with greater than 50%  respiratory variability, suggesting right atrial pressure of 3 mmHg.   Antimicrobials:  Anti-infectives (From admission, onward)   Start     Dose/Rate Route Frequency Ordered Stop   06/10/19 1330  cefTRIAXone (ROCEPHIN) 2 g in sodium chloride 0.9 % 100 mL IVPB     2 g 200 mL/hr over 30 Minutes Intravenous Every 12 hours 06/10/19 1247     06/08/19 1600  ampicillin (OMNIPEN) 2 g in sodium chloride 0.9 % 100 mL IVPB     2 g 300 mL/hr over 20 Minutes Intravenous Every 6 hours 06/08/19 1144     06/08/19 0845  ampicillin (OMNIPEN) 2 g in sodium chloride 0.9 % 100 mL IVPB     2 g 300 mL/hr over 20 Minutes Intravenous  Once 06/08/19 0835 06/08/19 1032   06/07/19 1630  cefTRIAXone (ROCEPHIN) 2 g in sodium chloride 0.9 % 100 mL IVPB  Status:  Discontinued     2 g 200 mL/hr over 30 Minutes Intravenous Every 24 hours 06/07/19 0953 06/08/19 0834   06/06/19 1630  cefTRIAXone (ROCEPHIN) 1 g in sodium chloride 0.9 % 100 mL  IVPB  Status:  Discontinued     1 g 200 mL/hr over 30 Minutes Intravenous  Once 06/06/19 1619 06/06/19 1626   06/06/19 1630  cefTRIAXone (ROCEPHIN) 1 g in sodium chloride 0.9 % 100 mL IVPB  Status:  Discontinued     1 g 200 mL/hr over 30 Minutes Intravenous Every 24 hours 06/06/19 1620 06/07/19 0953       Subjective: Patient seen and examined at bedside.  Poor historian.  Sleepy, wakes up only very slightly, hardly answers any questions.  Overall oral intake is still poor as per nursing staff.  No overnight fever or vomiting reported.    Objective: Vitals:   06/13/19 0908 06/13/19 1500 06/13/19 2102 06/14/19 0449  BP: (!) 99/59 (!) 94/52 112/64 102/64  Pulse: 91 88 93 85  Resp: 17 16 18 17   Temp: 98 F (36.7 C)  98.7 F (37.1 C) 99.3 F (37.4 C)  TempSrc: Axillary  Oral Oral  SpO2: 100% 96% 96% 94%  Weight:      Height:        Intake/Output Summary (Last 24 hours) at 06/14/2019 0739 Last data filed at 06/14/2019 0530 Gross per 24 hour  Intake 1396 ml  Output -  Net 1396 ml   Filed Weights   06/09/19 2053 06/11/19  2127 06/12/19 2044  Weight: 80.7 kg 80.3 kg 78.9 kg    Examination:  General exam: No acute distress.  Elderly female.  Sleepy, wakes up only very slightly, hardly answers any questions. Extremely poor historian. Respiratory system: Bilateral decreased breath sounds at bases with some scattered crackles.  No wheezing  cardiovascular system: Rate controlled, S1-S2 heard Gastrointestinal system: Abdomen is nondistended, soft and nontender.  Normal bowel sounds are heard  extremities: No clubbing; trace lower extremity edema present   Data Reviewed: I have personally reviewed following labs and imaging studies  CBC: Recent Labs  Lab 06/08/19 0448 06/09/19 0427 06/10/19 0833 06/11/19 0526 06/12/19 0427  WBC 8.3 9.3 8.0 8.9 10.2  NEUTROABS 5.5 6.2 5.4 6.1 6.8  HGB 14.1 15.2* 14.3 14.0 14.7  HCT 43.9 46.8* 43.5 42.7 45.5  MCV 90.3 91.1 88.1 89.3 89.7   PLT 196 245 234 240 086   Basic Metabolic Panel: Recent Labs  Lab 06/08/19 0448 06/09/19 0427 06/10/19 0833 06/11/19 0526 06/12/19 0427  NA 140 140 141 142 142  K 3.9 4.8 3.3* 4.2 3.6  CL 110 108 107 112* 110  CO2 18* 18* 20* 17* 18*  GLUCOSE 108* 133* 157* 167* 140*  BUN 9 8 7* 6* 6*  CREATININE 0.78 0.81 0.78 0.88 0.80  CALCIUM 8.9 9.4 9.4 9.0 8.9  MG 1.4* 1.5* 1.7 1.4* 1.8   GFR: Estimated Creatinine Clearance: 49.3 mL/min (by C-G formula based on SCr of 0.8 mg/dL). Liver Function Tests: Recent Labs  Lab 06/08/19 0448 06/09/19 0427 06/10/19 0833 06/11/19 0526  AST 28 51* 20 21  ALT 23 16 20 17   ALKPHOS 59 59 63 56  BILITOT 0.7 2.1* 0.6 0.8  PROT 5.6* 5.8* 6.1* 5.6*  ALBUMIN 2.7* 2.9* 3.0* 2.9*   No results for input(s): LIPASE, AMYLASE in the last 168 hours. No results for input(s): AMMONIA in the last 168 hours. Coagulation Profile: No results for input(s): INR, PROTIME in the last 168 hours. Cardiac Enzymes: No results for input(s): CKTOTAL, CKMB, CKMBINDEX, TROPONINI in the last 168 hours. BNP (last 3 results) No results for input(s): PROBNP in the last 8760 hours. HbA1C: No results for input(s): HGBA1C in the last 72 hours. CBG: Recent Labs  Lab 06/13/19 0648 06/13/19 1118 06/13/19 1600 06/13/19 2057 06/14/19 0712  GLUCAP 157* 79 159* 164* 176*   Lipid Profile: No results for input(s): CHOL, HDL, LDLCALC, TRIG, CHOLHDL, LDLDIRECT in the last 72 hours. Thyroid Function Tests: No results for input(s): TSH, T4TOTAL, FREET4, T3FREE, THYROIDAB in the last 72 hours. Anemia Panel: No results for input(s): VITAMINB12, FOLATE, FERRITIN, TIBC, IRON, RETICCTPCT in the last 72 hours. Sepsis Labs: Recent Labs  Lab 06/07/19 1027 06/08/19 0448 06/09/19 0427  PROCALCITON <0.10 <0.10 <0.10    Recent Results (from the past 240 hour(s))  Urine Culture     Status: Abnormal   Collection Time: 06/06/19  3:03 PM   Specimen: Urine, Catheterized  Result Value  Ref Range Status   Specimen Description URINE, CATHETERIZED  Final   Special Requests   Final    ADDED 2253 Performed at [facility], [place] [address], [place], [place] [address]    Culture 70,000 COLONIES/mL PROTEUS MIRABILIS (A)  Final   Report Status 06/09/2019 FINAL  Final   Organism ID, Bacteria PROTEUS MIRABILIS (A)  Final      Susceptibility   Proteus mirabilis - MIC*    AMPICILLIN <=2 SENSITIVE Sensitive     CEFAZOLIN 8 SENSITIVE  Sensitive     CEFTRIAXONE <=0.25 SENSITIVE Sensitive     CIPROFLOXACIN >=4 RESISTANT Resistant     GENTAMICIN <=1 SENSITIVE Sensitive     IMIPENEM 2 SENSITIVE Sensitive     NITROFURANTOIN 128 RESISTANT Resistant     TRIMETH/SULFA >=320 RESISTANT Resistant     AMPICILLIN/SULBACTAM <=2 SENSITIVE Sensitive     PIP/TAZO <=4 SENSITIVE Sensitive     * 70,000 COLONIES/mL PROTEUS MIRABILIS  Blood Culture (routine x 2)     Status: Abnormal   Collection Time: 06/06/19  3:20 PM   Specimen: BLOOD  Result Value Ref Range Status   Specimen Description BLOOD RIGHT ANTECUBITAL  Final   Special Requests   Final    BOTTLES DRAWN AEROBIC AND ANAEROBIC Blood Culture results may not be optimal due to an inadequate volume of blood received in culture bottles   Culture  Setup Time   Final    GRAM POSITIVE COCCI IN CHAINS ANAEROBIC BOTTLE ONLY CRITICAL RESULT CALLED TO, READ BACK BY AND VERIFIED WITH: Lorita Officer  [place] [id number] [place] Performed at [facility], [place] [address], [place], [place] [address]    Culture ENTEROCOCCUS FAECALIS (A)  Final   Report Status 06/09/2019 FINAL  Final   Organism ID, Bacteria ENTEROCOCCUS FAECALIS  Final      Susceptibility   Enterococcus faecalis - MIC*    AMPICILLIN <=2 SENSITIVE Sensitive     VANCOMYCIN 1 SENSITIVE Sensitive     GENTAMICIN SYNERGY SENSITIVE Sensitive     * ENTEROCOCCUS FAECALIS  Blood Culture (routine x 2)     Status: None   Collection Time: 06/06/19  3:30 PM   Specimen: BLOOD  Result Value  Ref Range Status   Specimen Description BLOOD LEFT ANTECUBITAL  Final   Special Requests   Final    BOTTLES DRAWN AEROBIC AND ANAEROBIC Blood Culture results may not be optimal due to an inadequate volume of blood received in culture bottles   Culture   Final    NO GROWTH 5 DAYS Performed at [facility], [place] [address], [place], [place] [address]    Report Status 06/11/2019 FINAL  Final  SARS CORONAVIRUS 2 (TAT 6-24 HRS) Nasopharyngeal Nasopharyngeal Swab     Status: None   Collection Time: 06/06/19  5:55 PM   Specimen: Nasopharyngeal Swab  Result Value Ref Range Status   SARS Coronavirus 2 NEGATIVE NEGATIVE Final    Comment: (NOTE) SARS-CoV-2 target nucleic acids are NOT DETECTED. The SARS-CoV-2 RNA is generally detectable in upper and lower respiratory specimens during the acute phase of infection. Negative results do not preclude SARS-CoV-2 infection, do not rule out co-infections with other pathogens, and should not be used as the sole basis for treatment or other patient management decisions. Negative results must be combined with clinical observations, patient history, and epidemiological information. The expected result is Negative. Fact Sheet for Patients: SugarRoll.be Fact Sheet for Healthcare Providers: https://www.woods-mathews.com/ This test is not yet approved or cleared by the [place] FDA and  has been authorized for detection and/or diagnosis of SARS-CoV-2 by FDA under an Emergency Use Authorization (EUA). This EUA will remain  in effect (meaning this test can be used) for the duration of the COVID-19 declaration under Section 56 4(b)(1) of the Act, 21 U.S.C. section 360bbb-3(b)(1), unless the authorization is terminated or revoked sooner. Performed at [facility], [place] [address] [place], [place] [address]   Culture, blood (routine x 2)  Status: None   Collection Time: 06/07/19 10:27 AM   Specimen:  BLOOD  Result Value Ref Range Status   Specimen Description BLOOD RIGHT ANTECUBITAL  Final   Special Requests   Final    BOTTLES DRAWN AEROBIC AND ANAEROBIC Blood Culture results may not be optimal due to an inadequate volume of blood received in culture bottles   Culture   Final    NO GROWTH 5 DAYS Performed at [facility], [place] [address] [place], [place] [address]    Report Status 06/12/2019 FINAL  Final  Culture, blood (routine x 2)     Status: Abnormal   Collection Time: 06/07/19 10:28 AM   Specimen: BLOOD RIGHT ARM  Result Value Ref Range Status   Specimen Description BLOOD RIGHT ARM  Final   Special Requests   Final    BOTTLES DRAWN AEROBIC ONLY Blood Culture results may not be optimal due to an inadequate volume of blood received in culture bottles   Culture  Setup Time   Final    GRAM POSITIVE COCCI IN CHAINS AEROBIC BOTTLE ONLY CRITICAL RESULT CALLED TO, READ BACK BY AND VERIFIED WITH: PHARMD J STEENWYK [id number] AT 826 BY CM    Culture (A)  Final    ENTEROCOCCUS FAECALIS SUSCEPTIBILITIES PERFORMED ON PREVIOUS CULTURE WITHIN THE LAST 5 DAYS. Performed at [facility], [place] [address] [place], [place] [address]    Report Status 06/09/2019 FINAL  Final  Blood Culture ID Panel (Reflexed)     Status: Abnormal   Collection Time: 06/07/19 10:28 AM  Result Value Ref Range Status   Enterococcus species DETECTED (A) NOT DETECTED Final    Comment: CRITICAL RESULT CALLED TO, READ BACK BY AND VERIFIED WITH: PHARMD J STEENWKY [id number] AT 825 AM BY CM    Vancomycin resistance NOT DETECTED NOT DETECTED Final   Listeria monocytogenes NOT DETECTED NOT DETECTED Final   Staphylococcus species NOT DETECTED NOT DETECTED Final   Staphylococcus aureus (BCID) NOT DETECTED NOT DETECTED Final   Streptococcus species NOT DETECTED NOT DETECTED Final   Streptococcus agalactiae NOT DETECTED NOT DETECTED Final   Streptococcus pneumoniae NOT DETECTED NOT DETECTED Final   Streptococcus  pyogenes NOT DETECTED NOT DETECTED Final   Acinetobacter baumannii NOT DETECTED NOT DETECTED Final   Enterobacteriaceae species NOT DETECTED NOT DETECTED Final   Enterobacter cloacae complex NOT DETECTED NOT DETECTED Final   Escherichia coli NOT DETECTED NOT DETECTED Final   Klebsiella oxytoca NOT DETECTED NOT DETECTED Final   Klebsiella pneumoniae NOT DETECTED NOT DETECTED Final   Proteus species NOT DETECTED NOT DETECTED Final   Serratia marcescens NOT DETECTED NOT DETECTED Final   Haemophilus influenzae NOT DETECTED NOT DETECTED Final   Neisseria meningitidis NOT DETECTED NOT DETECTED Final   Pseudomonas aeruginosa NOT DETECTED NOT DETECTED Final   Candida albicans NOT DETECTED NOT DETECTED Final   Candida glabrata NOT DETECTED NOT DETECTED Final   Candida krusei NOT DETECTED NOT DETECTED Final   Candida parapsilosis NOT DETECTED NOT DETECTED Final   Candida tropicalis NOT DETECTED NOT DETECTED Final    Comment: Performed at [facility], [address], [place], [place] [address]  MRSA PCR Screening     Status: Abnormal   Collection Time: 06/08/19 12:11 AM   Specimen: Nasal Mucosa; Nasopharyngeal  Result Value Ref Range Status   MRSA by PCR POSITIVE (A) NEGATIVE Final    Comment:        The GeneXpert MRSA Assay (FDA approved  for NASAL specimens only), is one component of a comprehensive MRSA colonization surveillance program. It is not intended to diagnose MRSA infection nor to guide or monitor treatment for MRSA infections. RESULT CALLED TO, READ BACK BY AND VERIFIED WITH: MAHMOUD,Z RN 06/08/2019 AT 7867 SKEEN,P Performed at [facility], [place] [address] [place], [place] [address]   Culture, blood (routine x 2)     Status: None   Collection Time: 06/08/19  8:07 PM   Specimen: BLOOD  Result Value Ref Range Status   Specimen Description BLOOD RIGHT ANTECUBITAL  Final   Special Requests   Final    BOTTLES DRAWN AEROBIC AND ANAEROBIC Blood Culture adequate  volume   Culture NO GROWTH 5 DAYS  Final   Report Status 06/13/2019 FINAL  Final  Culture, blood (routine x 2)     Status: None   Collection Time: 06/08/19  8:15 PM   Specimen: BLOOD LEFT HAND  Result Value Ref Range Status   Specimen Description BLOOD LEFT HAND  Final   Special Requests   Final    BOTTLES DRAWN AEROBIC ONLY Blood Culture adequate volume   Culture NO GROWTH 5 DAYS  Final   Report Status 06/13/2019 FINAL  Final         Radiology Studies: No results found.      Scheduled Meds: . allopurinol  200 mg Oral Daily  . apixaban  5 mg Oral BID  . atorvastatin  10 mg Oral QHS  . famotidine  20 mg Oral BID  . insulin aspart  0-9 Units Subcutaneous TID WC  . isosorbide mononitrate  60 mg Oral Daily  . topiramate  25 mg Oral QHS  . vitamin B-12  1,000 mcg Oral Daily   Continuous Infusions: . ampicillin (OMNIPEN) IV 2 g (06/14/19 0430)  . cefTRIAXone (ROCEPHIN)  IV 2 g (06/13/19 2217)          Aline August, MD [facility] 06/14/2019, 7:39 AM

## 2019-06-30 DEATH — deceased
